# Patient Record
Sex: Female | Born: 1935 | Race: White | Hispanic: No | Marital: Married | Smoking: Former smoker
Health system: Southern US, Community
[De-identification: ages and names within clinical notes are randomized; demographics above are authoritative.]

## PROBLEM LIST (undated history)

## (undated) DIAGNOSIS — I2510 Atherosclerotic heart disease of native coronary artery without angina pectoris: Secondary | ICD-10-CM

## (undated) DIAGNOSIS — I10 Essential (primary) hypertension: Secondary | ICD-10-CM

## (undated) DIAGNOSIS — R918 Other nonspecific abnormal finding of lung field: Secondary | ICD-10-CM

## (undated) DIAGNOSIS — E7800 Pure hypercholesterolemia, unspecified: Secondary | ICD-10-CM

## (undated) DIAGNOSIS — F419 Anxiety disorder, unspecified: Secondary | ICD-10-CM

## (undated) DIAGNOSIS — I639 Cerebral infarction, unspecified: Secondary | ICD-10-CM

## (undated) DIAGNOSIS — M1990 Unspecified osteoarthritis, unspecified site: Secondary | ICD-10-CM

## (undated) DIAGNOSIS — J189 Pneumonia, unspecified organism: Secondary | ICD-10-CM

## (undated) HISTORY — DX: Anxiety disorder, unspecified: F41.9

## (undated) HISTORY — DX: Pure hypercholesterolemia, unspecified: E78.00

## (undated) HISTORY — DX: Essential (primary) hypertension: I10

## (undated) HISTORY — PX: CORONARY ANGIOPLASTY WITH STENT PLACEMENT: SHX49

## (undated) HISTORY — PX: OTHER SURGICAL HISTORY: SHX169

## (undated) HISTORY — DX: Atherosclerotic heart disease of native coronary artery without angina pectoris: I25.10

---
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
## 1974-04-05 HISTORY — PX: OTHER SURGICAL HISTORY: SHX169

## 1975-04-06 HISTORY — PX: PARTIAL HYSTERECTOMY: SHX80

## 1988-04-05 HISTORY — PX: NECK SURGERY: SHX720

## 2005-06-02 ENCOUNTER — Ambulatory Visit (HOSPITAL_COMMUNITY): Admission: RE | Admit: 2005-06-02 | Discharge: 2005-06-02 | Payer: Self-pay | Admitting: Preventative Medicine

## 2006-04-05 DIAGNOSIS — I2510 Atherosclerotic heart disease of native coronary artery without angina pectoris: Secondary | ICD-10-CM

## 2006-04-05 HISTORY — DX: Atherosclerotic heart disease of native coronary artery without angina pectoris: I25.10

## 2006-08-01 ENCOUNTER — Encounter: Admission: RE | Admit: 2006-08-01 | Discharge: 2006-08-01 | Payer: Self-pay | Admitting: Family Medicine

## 2006-08-03 ENCOUNTER — Ambulatory Visit: Payer: Self-pay | Admitting: Cardiovascular Disease

## 2006-08-03 LAB — CONVERTED CEMR LAB
Basophils Relative: 0.3 % (ref 0.0–1.0)
Chloride: 106 meq/L (ref 96–112)
Creatinine, Ser: 0.9 mg/dL (ref 0.4–1.2)
GFR calc Af Amer: 80 mL/min
HCT: 40.2 % (ref 36.0–46.0)
Hemoglobin: 13.7 g/dL (ref 12.0–15.0)
INR: 0.9 (ref 0.9–2.0)
Lymphocytes Relative: 34.1 % (ref 12.0–46.0)
Monocytes Relative: 9.6 % (ref 3.0–11.0)
Neutrophils Relative %: 52.7 % (ref 43.0–77.0)
Platelets: 221 10*3/uL (ref 150–400)
Potassium: 3.9 meq/L (ref 3.5–5.1)

## 2006-08-05 ENCOUNTER — Ambulatory Visit: Payer: Self-pay

## 2006-08-09 ENCOUNTER — Ambulatory Visit: Payer: Self-pay | Admitting: Cardiovascular Disease

## 2006-08-09 ENCOUNTER — Inpatient Hospital Stay (HOSPITAL_COMMUNITY): Admission: AD | Admit: 2006-08-09 | Discharge: 2006-08-11 | Payer: Self-pay | Admitting: Cardiovascular Disease

## 2006-08-09 ENCOUNTER — Inpatient Hospital Stay (HOSPITAL_BASED_OUTPATIENT_CLINIC_OR_DEPARTMENT_OTHER): Admission: RE | Admit: 2006-08-09 | Discharge: 2006-08-09 | Payer: Self-pay | Admitting: Cardiovascular Disease

## 2006-09-05 ENCOUNTER — Ambulatory Visit: Payer: Self-pay | Admitting: Cardiology

## 2006-09-05 ENCOUNTER — Ambulatory Visit: Payer: Self-pay | Admitting: Cardiovascular Disease

## 2006-09-06 ENCOUNTER — Ambulatory Visit: Payer: Self-pay | Admitting: Cardiovascular Disease

## 2006-09-06 LAB — CONVERTED CEMR LAB
Alkaline Phosphatase: 98 units/L (ref 39–117)
Bilirubin, Direct: 0.1 mg/dL (ref 0.0–0.3)
Cholesterol: 184 mg/dL (ref 0–200)
HDL: 48.9 mg/dL (ref >39.0)
Total Bilirubin: 0.4 mg/dL (ref 0.3–1.2)
Total Protein: 6.9 g/dL (ref 6.0–8.3)

## 2007-08-16 ENCOUNTER — Ambulatory Visit: Payer: Self-pay

## 2007-08-16 ENCOUNTER — Ambulatory Visit: Payer: Self-pay | Admitting: Cardiovascular Disease

## 2009-03-24 ENCOUNTER — Emergency Department (HOSPITAL_COMMUNITY): Admission: EM | Admit: 2009-03-24 | Discharge: 2009-03-24 | Payer: Self-pay | Admitting: Emergency Medicine

## 2009-08-29 ENCOUNTER — Emergency Department (HOSPITAL_COMMUNITY): Admission: EM | Admit: 2009-08-29 | Discharge: 2009-08-29 | Payer: Self-pay | Admitting: Emergency Medicine

## 2009-08-29 ENCOUNTER — Encounter: Payer: Self-pay | Admitting: Cardiovascular Disease

## 2010-01-07 DIAGNOSIS — I10 Essential (primary) hypertension: Secondary | ICD-10-CM

## 2010-01-07 DIAGNOSIS — I2510 Atherosclerotic heart disease of native coronary artery without angina pectoris: Secondary | ICD-10-CM | POA: Insufficient documentation

## 2010-01-07 DIAGNOSIS — E7800 Pure hypercholesterolemia, unspecified: Secondary | ICD-10-CM

## 2010-01-07 DIAGNOSIS — F411 Generalized anxiety disorder: Secondary | ICD-10-CM | POA: Insufficient documentation

## 2010-01-08 ENCOUNTER — Ambulatory Visit: Payer: Self-pay | Admitting: Cardiovascular Disease

## 2010-01-26 ENCOUNTER — Encounter: Payer: Self-pay | Admitting: Cardiovascular Disease

## 2010-04-21 ENCOUNTER — Emergency Department (HOSPITAL_COMMUNITY)
Admission: EM | Admit: 2010-04-21 | Discharge: 2010-04-21 | Payer: Self-pay | Source: Home / Self Care | Admitting: Emergency Medicine

## 2010-05-05 NOTE — Progress Notes (Signed)
Summary: Physician Documentation Sheet  Physician Documentation Sheet   Imported By: Debby Freiberg 01/07/2010 15:54:59  _____________________________________________________________________  External Attachment:    Type:   Image     Comment:   External Document

## 2010-05-05 NOTE — Assessment & Plan Note (Signed)
Summary: check up c/o stent placment '08/mt   CC:  check up.  History of Present Illness: [name] returns today for follow up.  She has had a previous stent to an OM branch back in 2008.  She is doing well.  She is not having angina.  She is hesitant to take medications. She gets bruising with Plavix and since her stent is 74 years old I told her she could stop this.  She has not had her cholesterol checked and I urged her to have Dr Jeannetta Nap check this as it has been high and she has known CAD.  She did mangage to stop smoking last June and I congratulated her on this. BP has been under good control with Felodipine.  Current Problems (verified): 1)  Hypertension  (ICD-401.9) 2)  Cad  (ICD-414.00) 3)  Anxiety  (ICD-300.00) 4)  Hypercholesterolemia  (ICD-272.0)  Current Medications (verified): 1)  Omega-3 Krill Oil 300 Mg Caps (Krill Oil) .Marland Kitchen.. 1 Tab By Mouth Once Daily 2)  Vitamin B-12 1000 Mcg Tabs (Cyanocobalamin) .Marland Kitchen.. 1  Tab By Mouth Once Daily 3)  Vitamin C 1000 Mg Tabs (Ascorbic Acid) .Marland Kitchen.. 1 Tab By Mouth Once Daily 4)  Vitamin D3 2000 Unit Caps (Cholecalciferol) .Marland Kitchen.. 1 Tab By Mouth Once Daily 5)  Aspirin 81 Mg  Tabs (Aspirin) .Marland Kitchen.. 1 Tab By Mouth Once Daily 6)  Plavix 75 Mg Tabs (Clopidogrel Bisulfate) .... Take One Tablet By Mouth Daily 7)  Felodipine 10 Mg Xr24h-Tab (Felodipine) .Marland Kitchen.. 1 Tab By Mouth Once Daily  Allergies (verified): No Known Drug Allergies  Past History:  Family History: Last updated: 01/08/2010 non-contributorhy  Social History: Last updated: 01/08/2010 Married Nonsmoker Nondrinker Walks regularly  Family History: non-contributorhy  Social History: Married Nonsmoker Nondrinker Walks regularly  Review of Systems       Denies fever, malais, weight loss, blurry vision, decreased visual acuity, cough, sputum, SOB, hemoptysis, pleuritic pain, palpitaitons, heartburn, abdominal pain, melena, lower extremity edema, claudication, or rash.   Vital  Signs:  Patient profile:   76 year old female Height:      62 inches Weight:      131 pounds BMI:     24.05 Pulse rate:   70 / minute Resp:     14 per minute BP sitting:   129 / 58  (left arm)  Vitals Entered By: Kem Parkinson (January 08, 2010 10:20 AM)  Physical Exam  General:  Affect appropriate Healthy:  appears stated age HEENT: normal Neck supple with no adenopathy JVP normal no bruits no thyromegaly Lungs clear with no wheezing and good diaphragmatic motion Heart:  S1/S2 no murmur,rub, gallop or click PMI normal Abdomen: benighn, BS positve, no tenderness, no AAA no bruit.  No HSM or HJR Distal pulses intact with no bruits No edema Neuro non-focal Skin warm and dry    Impression & Recommendations:  Problem # 1:  HYPERTENSION (ICD-401.9) Well controlled Her updated medication list for this problem includes:    Aspirin 81 Mg Tabs (Aspirin) .Marland Kitchen... 1 tab by mouth once daily    Felodipine 10 Mg Xr24h-tab (Felodipine) .Marland Kitchen... 1 tab by mouth once daily  Orders: EKG w/ Interpretation (93000)  Problem # 2:  CAD (ICD-414.00) Stable with no angina Ok to stop Plavix Her updated medication list for this problem includes:    Aspirin 81 Mg Tabs (Aspirin) .Marland Kitchen... 1 tab by mouth once daily    Plavix 75 Mg Tabs (Clopidogrel bisulfate) .Marland Kitchen... Take one tablet by mouth  daily    Felodipine 10 Mg Xr24h-tab (Felodipine) .Marland Kitchen... 1 tab by mouth once daily  Orders: EKG w/ Interpretation (93000)  Problem # 3:  HYPERCHOLESTEROLEMIA (ICD-272.0) Recheck labs and likely encourage statin use for target LDL less than 70  Patient Instructions: 1)  Your physician recommends that you schedule a follow-up appointment in: 1 YEAR WITH DR Eden Emms 2)  Your physician recommends that you return for lab work in: DUE TO HAVE CHOLESTEROL BLOOD TEST HERE OR AT PMD 3)  Your physician has recommended you make the following change in your medication: STOP PLAVIX   EKG Report  Procedure date:   01/08/2010  Findings:      NSR 66 Normal ECG

## 2010-08-18 NOTE — Assessment & Plan Note (Signed)
[facility]                            CARDIOLOGY OFFICE NOTE   NAME:[name], [name] CLASBY                      MRN:          [id number]  DATE:08/16/2007                            DOB:          January 18, 1935    [name] [name] returns today for follow up.  She has had a previous stent  to an OM branch back in 2008.  She is doing well.  She is not having  angina.  She is hesitant to take medications.  She wanted to come off  her Plavix, and I told her it was not advisable.  She does not want to  take cholesterol medicine.  We had a long discussion about this.  Her  LDL is 115.  She insists on trying to treat it with diet.  She has been  compliant with her aspirin, Plavix and blood pressure pills.  She was  asking for a handwritten prescription for her Plavix to send to a  [place] drug company.  I told her I would be happy to do this to help  defer the cost.   REVIEW OF SYSTEMS:  Otherwise negative.  In particular, she has not had  any side effects from her medication and is not having angina.   CURRENT MEDICATIONS:  1. Felodipine 10 mg a day.  2. Plavix 75 a day.  3. An aspirin a day.  4. Ativan p.r.n.  5. Vitamin D.   PHYSICAL EXAMINATION:  GENERAL:  Her exam is remarkable for a talkative  white female in no distress.  VITAL SIGNS:  Weight is 121, blood pressure is 150/80, pulse 69 and  regular, afebrile.  NECK:  Carotids normal without bruit.  No lymphadenopathy, no  thyromegaly, no JVP elevation.  LUNGS:  Clear with good diaphragmatic motion.  No wheezing.  HEART:  S1-S2, normal heart sounds.  PMI normal.  ABDOMEN:  Benign.  Bowel sounds.  No AAA, no tenderness, no  hepatosplenomegaly, no hepatojugular reflux.  EXTREMITIES:  Distal pulses are intact, no edema.  NEUROLOGIC:  Nonfocal.  SKIN:  Warm and dry.  No muscle weakness.   I reviewed her carotid artery duplex.  It was normal with 0 to 39%  bilateral stenoses.   IMPRESSION:  1. Coronary  disease, previous stent to the OM.  Continue aspirin and      Plavix.  Follow-up Myoview in a year.  2. Hypertension, currently better controlled on Plendil.  Continue a      low-salt diet.  3. Hypercholesterolemia.  The patient insists on diet control.  Should      be on a statin.  She refuses.  4. History of anxiety.  Ativan p.r.n. per primary care doctor.   I congratulated the patient on her recent marriage 6 months ago.  Her  new husband was with her today.  He seems very nice.  Her EKG was  entirely normal today.     [name]. Eden Emms, MD, [facility]  Electronically Signed    PCN/MedQ  DD: 08/16/2007  DT: 08/16/2007  Job #: [id number]

## 2010-08-18 NOTE — Assessment & Plan Note (Signed)
[name] [name]                            CARDIOLOGY OFFICE NOTE   NAME:[name] [name], [name] [name]                      MRN:          [id number]  DATE:09/05/2006                            DOB:          10/01/1934    [name] [name] returns today for followup. She initially presented to me with  somewhat atypical jaw pain with GI overtones; however, this turned out  to be an anginal equivalent. She had a tight OM lesion that was stented  by Dr. Juanda Chance.   Since discharge, she has had some bruising around the cath site in her  right arm. She has not had any recurrent chest pain. Her symptoms have  resolved indicating that these jaw and GI type symptoms were her anginal  equivalent. Her coronary risk factors include family history and  hypertension. Her cholesterol status is unknown but I suspect she will  need to be no a statin drug.   When she left the hospital, she was only taking Plavix, she was scared  to take both baby aspirin and Plavix. I told her she needed to do this.   REVIEW OF SYSTEMS:  Otherwise benign. She was asking for Ativan which  she normally takes for anxiety at night.   CURRENT MEDICATIONS:  1. Zyrtec 10 a day.  2. Felodipine 10 a day.  3. Piroxicam 20 a day.  4. Plavix 75 a day.  5. To start taking the baby aspirin a day.  6. Ativan 1 mg a day.   PHYSICAL EXAMINATION:  GENERAL:  She is a healthy-appearing, elderly,  white female who appears younger than her stated age. Affect is  elevated.  VITAL SIGNS:  Her weight is stable at 180, blood pressure is 140/80,  pulse is 70 and regular. Respiratory rate is 14, she is afebrile.  HEENT:  Normal.  NECK:  Supple. There is no thyromegaly, no lymphadenopathy, no JVP  elevation. She has a faint left carotid bruit.  LUNGS:  Clear with normal diaphragmatic motion, no wheezing. There is an  S1, S2 with normal heart sounds.  CARDIAC:  PMI is normal.  ABDOMEN:  Benign. Bowel sounds positive. There is no  tenderness noted,  no hepatosplenomegaly, no hepatojugular reflux. Femoral site is well  healed with no residual bruit. There is mild ecchymosis.  EXTREMITIES:  Distal pulses are intact. No edema.  NEUROLOGIC:  Nonfocal.  MUSCULOSKELETAL:  Muscular exam is normal with no weakness.   IMPRESSION:  Stable coronary artery disease, recent stent to the OM  branch. Continue aspirin and Plavix.   The patient is not on a beta blocker. She prefers not to start this at  this time.   I suspect she will need to be on a statin drug. She will have a fasting  lipid and liver profile. I took the liberty of giving her a prescription  for Zocor. Apparently Dr. Juanda Chance gave her a prescription for Lipitor  when she left the hospital but she did not want to take this for some  reason. Her husband takes Zocor and she prefers to be on this.   Her blood  pressure is well controlled on felodipine. We will continue  this.   We will monitor her blood pressure at home.   She will have a CBC and platelet count as she appears to have some easy  bruising and I want to make sure that the Plavix does not cause  thrombocytopenia.   In regards her anxiety, I did refill her prescriptions for Ativan to  take q.h.s. She will followup with Dr. Windle Guard for any further  refills.   I will see her back in about 3 months.     [name]. Eden Emms, MD, [facility]  Electronically Signed    PCN/MedQ  DD: 09/05/2006  DT: 09/05/2006  Job #: [id number]

## 2010-08-21 NOTE — Cardiovascular Report (Signed)
[name] [name], [name] [name]               ACCOUNT NO.:  [id number]   MEDICAL RECORD NO.:  [id number]          PATIENT TYPE:  INP   LOCATION:  2807                         FACILITY:  [facility]   PHYSICIAN:  Everardo Beals. Juanda Chance, MD, FACCDATE OF BIRTH:  04-22-1934   DATE OF PROCEDURE:  08/10/2006  DATE OF DISCHARGE:                            CARDIAC CATHETERIZATION   PROCEDURE:  Percutaneous coronary intervention procedure.   HISTORY OF PRESENT ILLNESS:  [name] is a 76 year old and has  developed exertional chest and jaw pain over the last 1-2 weeks.  She  was studied yesterday in the outpatient laboratory by Dr. Eden Emms and  found to have a tight lesion in the circumflex marginal vessel.  She is  brought in the hospital for intervention today.  She was consented to  the Perseus Trial with the new Taxus element stent.   PROCEDURE:  The procedure initially was attempted via the left femoral  artery.  We initially had some trouble passing the wire up all the way  to the aorta, and after placing the sheath, we were unable to navigate  further up the aorta.  Injection showed staining, and we felt there was  a wire dissection.  For this reason, we accessed the right femoral  artery.  This was accomplished without difficulty.  We used a CLS 3.5 6-  [place] guiding catheter with side holes and a Research scientist (physical sciences).  The  patient given Angiomax bolus and infusion that had been previously  loaded with Plavix.  We initially predilated the lesion in the  circumflex marginal vessel with a 2.25 x 50-mm Maverick performing one  inflation of 8 atmospheres for 30 seconds.  We then deployed a 2.25 x 20-  mm TAXUS - Element Perseus study stent deploying this with two  inflations at 10 atmospheres for 30 seconds.  We then post dilated with  a 2.5 x 50-mm Quantum Maverick performing two inflations at 15  atmospheres for 30 seconds being careful to avoid the distal edge. Final  diagnostic studies performed through the  guiding catheter.   Following the PCI procedure, we did distal aortogram to assess the left  iliac vessel which now was patent.  There was no evidence of the wire  tear and no staining or evident dissection, but there was narrowing of  the lumen just distal to the sheath.  We felt this could be left alone.   RESULTS:  Initially the stenosis in the circumflex marginal vessels  estimated 90%.  Following stenting this improved to 0%.   CONCLUSION:  Successful PCI of the lesion in the marginal branch of  circumflex artery using a Taxus Element PERSEUS study stent with  improvement of circumflex narrowing from 90% to 0%.   DISPOSITION:  The patient was returned to the __________ for further  observation.  Will get both sheaths out in 2 hours.      Bruce Elvera Lennox Juanda Chance, MD, [facility]  Electronically Signed     BRB/MEDQ  D:  08/10/2006  T:  08/10/2006  Job:  [id number]   cc:   Windle Guard, M.D.  [name].  Eden Emms, MD, [facility]  Cardiopulmonary Laboratory

## 2010-08-21 NOTE — Consult Note (Signed)
NAME:  [name] [name], [name] [name]               ACCOUNT NO.:  [id number]   MEDICAL RECORD NO.:  [id number]          PATIENT TYPE:  OIB   LOCATION:  NA                           FACILITY:  [facility]   PHYSICIAN:  Peter C. Eden Emms, MD, FACCDATE OF BIRTH:  [birth year]   DATE OF CONSULTATION:  DATE OF DISCHARGE:                                 CONSULTATION   HISTORY OF PRESENT ILLNESS:  [name] [name] is a 76 year old patient of Dr.  Jeannetta Nap.  She has been having new onset jaw pain.  She is a long-time  smoker with a left carotid bruit.  Study was done to rule out coronary  disease.   Stent catheterizations, left femoral artery.  We had some difficulty  passing our wire during sheath placement; therefore, at the end of the  case a distal aortogram with bilateral iliac runoff was performed.   Left main coronary artery was normal.   Left anterior descending artery had 20% multiple lesions at the proximal  and mid portions.  Distal vessel was normal.  The ostium of the first  diagonal branch had 30-40% tubular disease.   Circumflex coronary artery was large and left dominant.  There was a  large first obtuse marginal branch.  It was a branching vessel.  The  inferior branch which was the larger branch had 90% napkin-like  circumferential stenosis.  The distal circ and PDA were normal.   Right carotid artery was nondominant and small.  There was 30-40%  tubular disease in the mid vessel.   RAO ventriculography:  RAO ventriculography showed hyperdynamic LV  function.  The ejection fraction was 65-70%.  There were no wall motion  abnormalities.  Aortic pressure was 140/90, LV pressure was 143/17.   Distal aortogram showed small left renal artery, and there was mild  iliac disease bilaterally.  There was no critical stenoses.   IMPRESSION:  The patient's sharp pain certainly could be an anginal  equivalent.  The obtuse marginal branch lesion is tight.  She was having  7/10 substernal chest pain this morning  prior to the cath.  I think the  best plan of action would be to admit the patient, load her with Plavix  and heparin, and proceed with percutaneous coronary intervention with  Dr. Juanda Chance in the morning.   The patient tolerated the procedure well.      [name]. Eden Emms, MD, [facility]  Electronically Signed     PCN/MEDQ  D:  08/09/2006  T:  08/09/2006  Job:  [id number]

## 2010-08-21 NOTE — Assessment & Plan Note (Signed)
[facility]                            CARDIOLOGY OFFICE NOTE   NAME:[name], [name] SCAGLIONE                      MRN:          [id number]  DATE:08/03/2006                            DOB:          08/04/1934    [name] is a extremely pleasant 76 year old patient referred by Dr.  Jeannetta Nap.  She has been having chest pain for 7-10 days.  The pain is  actually more localized in her jaw.  She has been having jaw pain that  radiates to her neck and right side.  There has been a lot of GI  associated symptoms, including burping and belching.  When she gets this  jaw and right neck pain, she can actually take antacids and feel better.  She had a right upper quadrant ultrasound ordered by Dr. Jeannetta Nap which  was apparently negative.  She has not had any symptoms in the last 48  hours.   The pain is not necessarily exertional.   It has been going on for a week to 10 days and she had previously not  had any problems with jaw pain.  She does have dentures and has not seen  a dentist in about 3 years.  This needs to be followed up for TMJ or  other possibilities.   From a cardiac standpoint, she has otherwise been healthy.  Coronary  risk factors include hypertension and smoking.   She has smoked 2 packs a day for quite some time and is on felodipine  for high blood pressure.   She is a non diabetic.   Her activity is somewhat limited.  She has been a little bit depressed.  She lost a daughter to lymphoma last year and her husband.  Apparently  her husband  had seen Dr. Myrtis Ser and Dr. Samule Ohm in the past.   REVIEW OF SYSTEMS:  Her review of systems otherwise remarkable for some  diarrhea.  She had some stomach upset with her jaw pain.  There was also  associated nausea but no frank vomiting.  Her diarrhea and nausea have  cleared up.  I suspect this is what necessitated the right upper  quadrant ultrasound.  I do not know if any lab work was done.  Her  review of  systems otherwise negative.   FAMILY HISTORY:  The patient's mother died at the age of 59 of  congestive heart failure, father died of a cerebral hemorrhage at age  58.   MEDICATIONS:  1. Zyrtec 10 a day.  2. Felodipine 10 a day.  3. Piroxicam 20 a day.  4. Vitamins.   SOCIAL HISTORY:  She lives by herself.  She has 2 dogs to keep her  company.  She has 1 daughter that lives less than a mile away.  She is  otherwise fairly inactive.   SURGICAL HISTORY:  Lower back surgery in 1976, partial hysterectomy in  1977, neck surgery in 1990, and appendectomy in 1950.   PHYSICAL EXAMINATION:  Healthy appearing female in no distress  Blood pressure 128/80, pulse 72 and regular. Afebrile RR 14  HEENT:  Normal.  NECK:  She has a fairly loud left carotid bruit.  LUNGS:  Clear.  No accesory muscle use  CARDIAC:  There is an S1, S2, with normal heart sounds. PMI normal  ABDOMEN:  Benign. No bruits masses or AAA  LOWER EXTREMITIES:  Intact pulses, no edema.  NEURO:  Nonfocal.  SKIN:  Warm and dry.  No muscular weakness  No lympadenopathy   Her EKG is totally normal.   IMPRESSION:  Her clinical symptoms sound more like possible intermittent  gallbladder spasms.  Apparently her right upper quadrant ultrasound was  normal.  There are a lot of GI overtones to her symptoms and they seem  to be improving.  In fact, she did have improvement with H2 blockers and  antacids.  However, I am somewhat concerned.  Jaw pain can frequently be  an anginal equivalent in females.  She is a long-time smoker and she  clearly has a carotid bruit.   Given this fact and the new onset of the symptoms, I recommended heart  catheterization to the patient.  She understands what this involves  since her husband had had them.   She is willing to proceed.  The risks, including stroke, need for  emergency surgery, dye reaction, and bleeding were discussed.  The  patient will have a carotid Duplex prior to this and we  will start her  on Plavix and aspirin.   She will be given a prescription for nitroglycerine to take in case she  were to have any recurrent episodes.  Further recommendations will be  based on the results of her heart catheterization and we will follow up  her carotid Duplex to make sure she does not have greater than 70%  lesion there in regards to her risk for stroke.   I suspect that she will also need a fasting lipid and liver profile  checked.  She will follow up with Dr. Jeannetta Nap regarding possibility of  Wellbutrin or Chantix to help her cut down on her smoking.   Further recommendations will be based on the results of her heart  catheterization.     [name]. Eden Emms, MD, [facility]  Electronically Signed    PCN/MedQ  DD: 08/03/2006  DT: 08/03/2006  Job #: [id number]   cc:   Windle Guard, M.D.

## 2010-08-21 NOTE — Discharge Summary (Signed)
NAMEJOPLIN, CANTY               ACCOUNT NO.:  [id number]   MEDICAL RECORD NO.:  [id number]          PATIENT TYPE:  INP   LOCATION:  6532                         FACILITY:  [facility]   PHYSICIAN:  Peter C. Eden Emms, MD, FACCDATE OF BIRTH:  February 05, 1935   DATE OF ADMISSION:  08/09/2006  DATE OF DISCHARGE:  08/11/2006                         DISCHARGE SUMMARY - REFERRING   DISCHARGE DIAGNOSES:  1. Unstable angina.  2. Status post stenting to the first obtuse marginal with a Taxus      stent which is drug-eluting.  3. [employer] study.  4. Tobacco use.  5. History of hypertension.  6. History as noted below.   PROCEDURES PERFORMED:  Cardiac catheterization on 08/09/2006 by Dr.  Eden Emms and drug-eluting stent to the OM1 on 08/10/2006 by Dr. Juanda Chance.   SUMMARY OF HISTORY:  [name] is a 76 year old white female who is  referred by Dr. Jeannetta Nap to Dr. Eden Emms on 08/03/2006.  She described a 7-  to 10-day history of exertional jaw discomfort radiating into her neck  and right side associated with burping and belching.  A right upper  quadrant ultrasound was unremarkable.  It was felt that this could be an  anginal equivalent, thus, Dr. Eden Emms recommended cardiac  catheterization.   PAST MEDICAL HISTORY:  Notable for tobacco use and hypertension.   LABORATORY:  Admission weight was 120 pounds.  Preadmission H&H was 13.7  and 40.2, normal indices, platelets 221, WBCs 9.6.  Prior to discharge  on the 8th, H&H was 11.0 and 32.7, normal indices, platelets 165, WBCs  9.9.  PTT was 26.5, PT 11.5.  On admission, sodium was 149, potassium  3.9, BUN 13, creatinine 0.9, glucose 97.  Prior to discharge sodium was  139, potassium 4.5, BUN 15, creatinine 1.05, glucose 98.  CK-MBs and  relative indexes x4 were unremarkable.  Troponins were 0.03, 0.03,  0.8  and 0.49.  EKG showed sinus bradycardia, normal axis, nonspecific ST-T  wave changes.   HOSPITAL COURSE:  [name] was brought in for  cardiac catheterization  on the 6th.  This was performed by Dr. Eden Emms.  Showed an ejection  fraction of 65% and a 90% OM1.  She was continued on heparin.  Findings  were reviewed with Dr. Juanda Chance.  It was felt that intervention should be  performed.  She was entered into the [employer] study.  Dr.  Juanda Chance, on 08/10/2006, performed stenting to the OM reducing this from  90% to 0%.  He was unable to axis via the left femoral artery due to  left __________.  Post procedure, the patient had bilateral blunt  ecchymosis with soft hematoma on the right.  Dr. Juanda Chance recommended  Plavix at least for 1 year.  On the 8th she was ambulating without  difficulty.  Dr. Eden Emms felt that she could be discharged home.   DISPOSITION:  Patient is discharged home.  Her wound care and activities  are dictated per her discharge instruction sheet.  She will follow up  with Dr. Dr. Eden Emms on 09/05/2006 at 12:15 p.m.  She was also asked to  follow  up with Dr. Jeannetta Nap as needed.   NEW PRESCRIPTIONS:  Include Plavix 75 mg daily, aspirin 325 daily,  Lipitor 40 mg nightly, Protonix 40 mg daily, Lopressor 25 b.i.d.,  nitroglycerin 0.4 as needed, Wellbutrin XL 150 mg daily for 2 months.  She was asked to continue her Zyrtec 10 mg daily and piroxicam 20 mg  daily.  She was asked not to take her felodipine.  She was asked to  avoid smoking or tobacco products.  She will need blood work in 6-8  weeks in regards to her FLP and LFTs.  She was asked to bring all  medications to all follow-up appointments and to maintain a low-sodium  heart-healthy diet.   Discharge time 25 minutes.      Joellyn Rued, PA-C      [name]. Eden Emms, MD, [facility]  Electronically Signed    EW/MEDQ  D:  08/11/2006  T:  08/11/2006  Job:  [id number]   cc:   Windle Guard, M.D.  [name]. Eden Emms, MD, [facility]

## 2010-08-21 NOTE — Cardiovascular Report (Signed)
[name], [name]               ACCOUNT NO.:  [id number]   MEDICAL RECORD NO.:  [id number]          PATIENT TYPE:  INP   LOCATION:  6532                         FACILITY:  [facility]   PHYSICIAN:  Peter C. Eden Emms, MD, FACCDATE OF BIRTH:  1934/12/10   DATE OF PROCEDURE:  09/09/2006  DATE OF DISCHARGE:  08/11/2006                            CARDIAC CATHETERIZATION   PROCEDURE:  Left coronaries and LV, aorta with bilateral runoff.   The patient had a left-dominant coronary circulation.  Left main  coronary artery was normal.   Left anterior descending artery was normal.   First and second diagonal branches were normal.   Circumflex coronary artery was left-dominant.  The proximal, mid and  distal circumflex were normal.  There was a 90% stenosis in the first  obtuse marginal branch.   The right coronary artery was small and nondominant and normal.   RAO ventriculography showed normal LV function with an EF of 65%.  There  was no gradient across the aortic valve and no MR.  LV pressure is  142/17, aortic pressure is 140/90.   IMPRESSION:  The patient has single-vessel coronary artery disease.  She  will be admitted for heparin therapy to have percutaneous coronary  intervention of the obtuse marginal branch in the morning.      [name] [name]. Eden Emms, MD, [facility]  Electronically Signed     PCN/MEDQ  D:  09/09/2006  T:  09/10/2006  Job:  [id number]

## 2011-01-05 ENCOUNTER — Encounter: Payer: Self-pay | Admitting: Cardiovascular Disease

## 2011-01-06 ENCOUNTER — Ambulatory Visit (INDEPENDENT_AMBULATORY_CARE_PROVIDER_SITE_OTHER): Payer: 59 | Admitting: Cardiovascular Disease

## 2011-01-06 ENCOUNTER — Encounter: Payer: Self-pay | Admitting: Cardiovascular Disease

## 2011-01-06 DIAGNOSIS — I2510 Atherosclerotic heart disease of native coronary artery without angina pectoris: Secondary | ICD-10-CM

## 2011-01-06 DIAGNOSIS — I10 Essential (primary) hypertension: Secondary | ICD-10-CM

## 2011-01-06 DIAGNOSIS — E7800 Pure hypercholesterolemia, unspecified: Secondary | ICD-10-CM

## 2011-01-06 DIAGNOSIS — E785 Hyperlipidemia, unspecified: Secondary | ICD-10-CM

## 2011-01-06 DIAGNOSIS — Z136 Encounter for screening for cardiovascular disorders: Secondary | ICD-10-CM

## 2011-01-06 LAB — HEPATIC FUNCTION PANEL
ALT: 15 U/L (ref 0–35)
Alkaline Phosphatase: 74 U/L (ref 39–117)
Bilirubin, Direct: 0 mg/dL (ref 0.0–0.3)

## 2011-01-06 NOTE — Assessment & Plan Note (Signed)
Stable with no angina and good activity level.  Continue medical Rx  

## 2011-01-06 NOTE — Patient Instructions (Signed)
Your physician wants you to follow-up in: YEAR WITH DR Haywood Filler will receive a reminder letter in the mail two months in advance. If you don't receive a letter, please call our office to schedule the follow-up appointment.  Your physician recommends that you continue on your current medications as directed. Please refer to the Current Medication list given to you today. Your physician recommends that you return for lab work in: LIPID AND LIVER DX 272.4 V58.69

## 2011-01-06 NOTE — Assessment & Plan Note (Signed)
Check labs today wiling to start statin.  Try zocor 20 mg if LDL over 130

## 2011-01-06 NOTE — Assessment & Plan Note (Signed)
Well controlled.  Continue current medications and low sodium Dash type diet.    

## 2011-01-06 NOTE — Progress Notes (Signed)
[name] returns today for follow up. She has had a previous stent to an OM branch back in 2008. She is doing well. She is not having angina. She is hesitant to take medications. She gets bruising with Plavix and since her stent is 74 years old I told her she could stop this on our last visit in 2011. Last cholesterol checked by Dr Jeannetta Nap 10/11 TC was 258 and LDL was 169.  She did mangage to stop smoking last June and I congratulated her on this. BP has been under good control with Felodipine.  ROS: Denies fever, malais, weight loss, blurry vision, decreased visual acuity, cough, sputum, SOB, hemoptysis, pleuritic pain, palpitaitons, heartburn, abdominal pain, melena, lower extremity edema, claudication, or rash.  All other systems reviewed and negative  General: Affect appropriate Healthy:  appears stated age HEENT: normal Neck supple with no adenopathy JVP normal no bruits no thyromegaly Lungs clear with no wheezing and good diaphragmatic motion Heart:  S1/S2 no murmur,rub, gallop or click PMI normal Abdomen: benighn, BS positve, no tenderness, no AAA no bruit.  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 Ascorbic Acid (VITAMIN C) 1000 MG tablet Take 1,000 mg by mouth daily.        Marland Kitchen aspirin 81 MG tablet Take 81 mg by mouth daily.        . Cholecalciferol (VITAMIN D3) 2000 UNITS TABS Take by mouth.        . felodipine (PLENDIL) 10 MG 24 hr tablet Take 10 mg by mouth daily.        . OMEGA-3 KRILL OIL 300 MG CAPS Take by mouth.        . vitamin B-12 (CYANOCOBALAMIN) 1000 MCG tablet Take 1,000 mcg by mouth daily.          Allergies  Review of patient's allergies indicates no known allergies.  Electrocardiogram: NSR 65 RAD otherwise normal  Assessment and Plan

## 2011-01-07 LAB — LIPID PANEL
Cholesterol: 233 mg/dL — ABNORMAL HIGH (ref 0–200)
HDL: 60.1 mg/dL (ref >39.00)
Total CHOL/HDL Ratio: 4
Triglycerides: 86 mg/dL (ref 0.0–149.0)
VLDL: 17.2 mg/dL (ref 0.0–40.0)

## 2011-01-07 LAB — LDL CHOLESTEROL, DIRECT: Direct LDL: 157.3 mg/dL

## 2011-01-08 ENCOUNTER — Other Ambulatory Visit: Payer: Self-pay | Admitting: *Deleted

## 2011-01-08 DIAGNOSIS — E785 Hyperlipidemia, unspecified: Secondary | ICD-10-CM

## 2011-01-08 DIAGNOSIS — Z79899 Other long term (current) drug therapy: Secondary | ICD-10-CM

## 2011-01-08 MED ORDER — LOVASTATIN 20 MG PO TABS: 20.0000 mg | ORAL_TABLET | Freq: Every day | ORAL | Status: DC

## 2011-04-09 ENCOUNTER — Other Ambulatory Visit: Payer: 59 | Admitting: *Deleted

## 2011-08-10 ENCOUNTER — Emergency Department (HOSPITAL_COMMUNITY): Payer: 59

## 2011-08-10 ENCOUNTER — Emergency Department (HOSPITAL_COMMUNITY)
Admission: EM | Admit: 2011-08-10 | Discharge: 2011-08-10 | Disposition: A | Discharge status: Home / Self Care | Payer: 59 | Attending: Emergency Medicine | Admitting: Emergency Medicine

## 2011-08-10 ENCOUNTER — Encounter (HOSPITAL_COMMUNITY): Payer: Self-pay | Admitting: *Deleted

## 2011-08-10 DIAGNOSIS — R079 Chest pain, unspecified: Secondary | ICD-10-CM | POA: Insufficient documentation

## 2011-08-10 DIAGNOSIS — R5381 Other malaise: Secondary | ICD-10-CM | POA: Insufficient documentation

## 2011-08-10 DIAGNOSIS — I10 Essential (primary) hypertension: Secondary | ICD-10-CM | POA: Insufficient documentation

## 2011-08-10 DIAGNOSIS — I2510 Atherosclerotic heart disease of native coronary artery without angina pectoris: Secondary | ICD-10-CM | POA: Insufficient documentation

## 2011-08-10 DIAGNOSIS — R110 Nausea: Secondary | ICD-10-CM | POA: Insufficient documentation

## 2011-08-10 DIAGNOSIS — I498 Other specified cardiac arrhythmias: Secondary | ICD-10-CM | POA: Insufficient documentation

## 2011-08-10 DIAGNOSIS — R0602 Shortness of breath: Secondary | ICD-10-CM | POA: Insufficient documentation

## 2011-08-10 LAB — CBC
HCT: 43.6 % (ref 36.0–46.0)
Hemoglobin: 14.6 g/dL (ref 12.0–15.0)
MCH: 29.3 pg (ref 26.0–34.0)
MCHC: 33.5 g/dL (ref 30.0–36.0)
Platelets: 279 10*3/uL (ref 150–400)
RDW: 12.6 % (ref 11.5–15.5)
WBC: 11 10*3/uL — ABNORMAL HIGH (ref 4.0–10.5)

## 2011-08-10 LAB — POCT I-STAT TROPONIN I

## 2011-08-10 LAB — DIFFERENTIAL
Basophils Relative: 1 % (ref 0–1)
Lymphocytes Relative: 22 % (ref 12–46)
Lymphs Abs: 2.4 10*3/uL (ref 0.7–4.0)
Monocytes Absolute: 1.3 10*3/uL — ABNORMAL HIGH (ref 0.1–1.0)
Monocytes Relative: 12 % (ref 3–12)
Neutro Abs: 7 10*3/uL (ref 1.7–7.7)

## 2011-08-10 LAB — COMPREHENSIVE METABOLIC PANEL
Alkaline Phosphatase: 100 U/L (ref 39–117)
Glucose, Bld: 103 mg/dL — ABNORMAL HIGH (ref 70–99)
Sodium: 138 mEq/L (ref 135–145)

## 2011-08-10 LAB — TROPONIN I: Troponin I: <0.3 ng/mL (ref <0.30)

## 2011-08-10 MED ORDER — NITROGLYCERIN 0.4 MG SL SUBL: 0.4000 mg | SUBLINGUAL_TABLET | SUBLINGUAL | Status: DC | PRN

## 2011-08-10 MED ORDER — ASPIRIN 81 MG PO CHEW: 324.0000 mg | CHEWABLE_TABLET | Freq: Once | ORAL | Status: DC

## 2011-08-10 MED ORDER — ASPIRIN 81 MG PO CHEW
162.0000 mg | CHEWABLE_TABLET | Freq: Once | ORAL | Status: AC
  Administered 2011-08-10: 162 mg via ORAL
  Filled 2011-08-10: qty 3

## 2011-08-10 MED ORDER — SODIUM CHLORIDE 0.9 % IV BOLUS (SEPSIS)
500.0000 mL | Freq: Once | INTRAVENOUS | Status: AC
  Administered 2011-08-10: 17:00:00 via INTRAVENOUS

## 2011-08-10 NOTE — Discharge Instructions (Signed)
Chest Pain (Nonspecific) It is often hard to give a specific diagnosis for the cause of chest pain. There is always a chance that your pain could be related to something serious, such as a heart attack or a blood clot in the lungs. You need to follow up with your caregiver for further evaluation. CAUSES   Heartburn.   Pneumonia or bronchitis.   Anxiety or stress.   Inflammation around your heart (pericarditis) or lung (pleuritis or pleurisy).   A blood clot in the lung.   A collapsed lung (pneumothorax). It can develop suddenly on its own (spontaneous pneumothorax) or from injury (trauma) to the chest.   Shingles infection (herpes zoster virus).  The chest wall is composed of bones, muscles, and cartilage. Any of these can be the source of the pain.  The bones can be bruised by injury.   The muscles or cartilage can be strained by coughing or overwork.   The cartilage can be affected by inflammation and become sore (costochondritis).  DIAGNOSIS  Lab tests or other studies, such as X-rays, electrocardiography, stress testing, or cardiac imaging, may be needed to find the cause of your pain.  TREATMENT   Treatment depends on what may be causing your chest pain. Treatment may include:   Acid blockers for heartburn.   Anti-inflammatory medicine.   Pain medicine for inflammatory conditions.   Antibiotics if an infection is present.   You may be advised to change lifestyle habits. This includes stopping smoking and avoiding alcohol, caffeine, and chocolate.   You may be advised to keep your head raised (elevated) when sleeping. This reduces the chance of acid going backward from your stomach into your esophagus.   Most of the time, nonspecific chest pain will improve within 2 to 3 days with rest and mild pain medicine.  HOME CARE INSTRUCTIONS   If antibiotics were prescribed, take your antibiotics as directed. Finish them even if you start to feel better.   For the next few  days, avoid physical activities that bring on chest pain. Continue physical activities as directed.   Do not smoke.   Avoid drinking alcohol.   Only take over-the-counter or prescription medicine for pain, discomfort, or fever as directed by your caregiver.   Follow your caregiver's suggestions for further testing if your chest pain does not go away.   Keep any follow-up appointments you made. If you do not go to an appointment, you could develop lasting (chronic) problems with pain. If there is any problem keeping an appointment, you must call to reschedule.  SEEK MEDICAL CARE IF:   You think you are having problems from the medicine you are taking. Read your medicine instructions carefully.   Your chest pain does not go away, even after treatment.   You develop a rash with blisters on your chest.  SEEK IMMEDIATE MEDICAL CARE IF:   You have increased chest pain or pain that spreads to your arm, neck, jaw, back, or abdomen.   You develop shortness of breath, an increasing cough, or you are coughing up blood.   You have severe back or abdominal pain, feel nauseous, or vomit.   You develop severe weakness, fainting, or chills.   You have a fever.  THIS IS AN EMERGENCY. Do not wait to see if the pain will go away. Get medical help at once. Call your local emergency services (911 in [place] Do not drive yourself to the hospital. MAKE SURE YOU:   Understand these instructions.     Will watch your condition.   Will get help right away if you are not doing well or get worse.  Document Released: 12/30/2004 Document Revised: 03/11/2011 Document Reviewed: 10/26/2007 [employer] Patient Information 2012 [employer]. 

## 2011-08-10 NOTE — ED Notes (Signed)
Pt in c/o intermittent chest pain since yesterday, worse today, also shortness of breath and nausea, weakness and feeling anxious, pt with history of MI with similar symptoms, pain radiates into neck and back

## 2011-08-10 NOTE — ED Notes (Signed)
Bed:WA19<BR> Expected date:<BR> Expected time:<BR> Means of arrival:<BR> Comments:<BR> Hold for triage 1 

## 2011-08-11 NOTE — ED Provider Notes (Signed)
History     CSN: [id number]  Arrival date & time 08/10/11  1530   First MD Initiated Contact with Patient 08/10/11 1550      Chief Complaint  Patient presents with  . Chest Pain    (Consider location/radiation/quality/duration/timing/severity/associated sxs/prior treatment) HPI Patient is a 77 year old female with a history of coronary artery disease and placement of a single coronary artery stent in 2008 by her cardiologist Dr. Eden Emms who presents today after having an episode of 60-90 minutes of left-sided chest pain similar to prior anginal pain. Upon arrival patient was tachycardic to 112. She was otherwise hemodynamically stable. Patient did describe having radiation of her pain into her neck and back. She had associated shortness of breath as well as nausea and weakness with this. Patient said this completely resolved prior to arrival when she took 2 of her nitroglycerin tabs. Patient did not take aspirin prior to arrival. She is currently completely asymptomatic. Patient does have a history of hypertension as well as hypercholesterolemia but is not a smoker. She's not a diabetic. There no other associated or modifying factors. Past Medical History  Diagnosis Date  . CAD (coronary artery disease)   . HTN (hypertension)   . Anxiety   . Hypercholesterolemia     Past Surgical History  Procedure Date  . Pci of hte lesion in th marginal branch circumflex artery   . Lower back surgery 1976  . Partial hysterectomy 1977  . Neck surgery 1990  . Appendectomy 1950  . Coronary angioplasty with stent placement     History reviewed. No pertinent family history.  History  Substance Use Topics  . Smoking status: Never Smoker   . Smokeless tobacco: Never Used  . Alcohol Use: No    OB History    Grav Para Term Preterm Abortions TAB SAB Ect Mult Living                  Review of Systems  HENT: Negative.   Eyes: Negative.   Respiratory: Positive for shortness of breath.     Cardiovascular: Positive for chest pain.  Gastrointestinal: Positive for nausea.  Genitourinary: Negative.   Musculoskeletal: Negative.   Neurological: Positive for weakness.  Psychiatric/Behavioral: Negative.   All other systems reviewed and are negative.    Allergies  Review of patient's allergies indicates no known allergies.  Home Medications   Current Outpatient Rx  Name Route Sig Dispense Refill  . VITAMIN C 1000 MG PO TABS Oral Take 1,000 mg by mouth daily.      . ASPIRIN 81 MG PO TABS Oral Take 81 mg by mouth daily.      Marland Kitchen VITAMIN D3 2000 UNITS PO TABS Oral Take by mouth.      . ESTROGENS CONJUGATED 0.3 MG PO TABS Oral Take 0.3 mg by mouth daily. Take daily for 21 days then do not take for 7 days.    . FELODIPINE ER 10 MG PO TB24 Oral Take 10 mg by mouth daily.      . OMEGA-3 KRILL OIL 300 MG PO CAPS Oral Take by mouth.      Marland Kitchen VITAMIN B-12 1000 MCG PO TABS Oral Take 1,000 mcg by mouth daily.      Marland Kitchen NITROGLYCERIN 0.4 MG SL SUBL Sublingual Place 1 tablet (0.4 mg total) under the tongue every 5 (five) minutes as needed for chest pain. 30 tablet 0    BP 124/103  Pulse 103  Temp(Src) 98.3 F (36.8 C) (Oral)  Resp  16  SpO2 96%  Physical Exam  Nursing note and vitals reviewed. GEN: Well-developed, well-nourished female in no distress HEENT: Atraumatic, normocephalic. Oropharynx clear without erythema EYES: PERRLA BL, no scleral icterus. NECK: Trachea midline, no meningismus CV: Regular rate and rhythm. No murmurs, rubs, or gallops PULM: No respiratory distress.  No crackles, wheezes, or rales. GI: soft, non-tender. No guarding, rebound, or tenderness. + bowel sounds  GU: deferred Neuro: cranial nerves 2-12 intact, no abnormalities of strength or sensation, A and O x 3 MSK: Patient moves all 4 extremities symmetrically, no deformity, edema, or injury noted Skin: No rashes petechiae, purpura, or jaundice Psych: no abnormality of mood   ED Course  Procedures  (including critical care time)   Date: 08/11/2011  Rate: 112  Rhythm: sinus tachycardia  QRS Axis: normal  Intervals: normal  ST/T Wave abnormalities: nonspecific T wave changes  Conduction Disutrbances:none  Narrative Interpretation: Tachycardic compared to previous with no other significant changes noted.  Old EKG Reviewed: changes noted   Labs Reviewed  COMPREHENSIVE METABOLIC PANEL - Abnormal; Notable for the following:    Glucose, Bld 103 (*)    Creatinine, Ser 1.18 (*)    GFR calc non Af Amer 44 (*)    GFR calc Af Amer 51 (*)    All other components within normal limits  CBC - Abnormal; Notable for the following:    WBC 11.0 (*)    All other components within normal limits  DIFFERENTIAL - Abnormal; Notable for the following:    Monocytes Absolute 1.3 (*)    All other components within normal limits  POCT I-STAT TROPONIN I  TROPONIN I   Dg Chest 2 View  08/10/2011  *RADIOLOGY REPORT*  Clinical Data: Chest pain  CHEST - 2 VIEW  Comparison: CT chest 06/02/2005  Findings: Cardiomediastinal silhouette is unremarkable.  No acute infiltrate or pleural effusion.  No pulmonary edema.  Osteopenia and mild degenerative changes thoracic spine.  There is a vague nodular density in the left upper lobe measures about 1.3 cm.  A lung nodule cannot be excluded.  Further evaluation with non emergent CT scan of the chest is recommended.  IMPRESSION: No active disease. Vague nodular density left upper lobe measures about 1.3 cm.  A lung nodule cannot be excluded.  Further evaluation with non emergent CT scan of the chest is recommended.  Original Report Authenticated By: Natasha Mead, M.D.     1. Chest pain       MDM  Patient was chest pain-free upon my evaluation. Her symptoms had resolved completely with nitroglycerin. She was hemodynamically stable and no longer tachycardic on my violation. EKG had been performed prior to my evaluation did show tachycardia. Patient received aspirin. She had  negative markers at 0 and 3 hours. Chest x-ray and remainder of chest pain workup was unremarkable. Patient did have a possible nodular density noted over the left upper lobe on her chest x-ray. She was notified of this finding and told that she would need to follow this up with a nonemergent chest CT with her primary care provider. Patient is to followup with her cardiologist in clinic. She was given a new prescription for nitroglycerin and she felt that her current prescription was too old. Patient was absolutely hemodynamically stable and chest pain-free throughout my entire period of care. Patient followup with her cardiologist for further management.        Cyndra Numbers, MD 08/11/11 [phone]

## 2011-08-16 ENCOUNTER — Encounter: Payer: Self-pay | Admitting: Physician Assistant

## 2011-08-16 ENCOUNTER — Ambulatory Visit (INDEPENDENT_AMBULATORY_CARE_PROVIDER_SITE_OTHER): Payer: 59 | Admitting: Physician Assistant

## 2011-08-16 VITALS — BP 146/74 | HR 80 | Ht 62.0 in | Wt 129.0 lb

## 2011-08-16 DIAGNOSIS — R918 Other nonspecific abnormal finding of lung field: Secondary | ICD-10-CM | POA: Insufficient documentation

## 2011-08-16 DIAGNOSIS — I2510 Atherosclerotic heart disease of native coronary artery without angina pectoris: Secondary | ICD-10-CM

## 2011-08-16 DIAGNOSIS — R0602 Shortness of breath: Secondary | ICD-10-CM

## 2011-08-16 DIAGNOSIS — R911 Solitary pulmonary nodule: Secondary | ICD-10-CM

## 2011-08-16 DIAGNOSIS — R079 Chest pain, unspecified: Secondary | ICD-10-CM

## 2011-08-16 DIAGNOSIS — I10 Essential (primary) hypertension: Secondary | ICD-10-CM

## 2011-08-16 DIAGNOSIS — E7800 Pure hypercholesterolemia, unspecified: Secondary | ICD-10-CM

## 2011-08-16 MED ORDER — LISINOPRIL 10 MG PO TABS: 10.0000 mg | ORAL_TABLET | Freq: Every day | ORAL | Status: DC

## 2011-08-16 NOTE — Patient Instructions (Signed)
Your physician recommends that you schedule a follow-up appointment in: 2-3 weeks with Dr Eden Emms Your physician recommends that you have lab work drawn today (BNP) and in 1 week (BMP) Your physician has recommended you make the following change in your medication: START Lisinopril 10 mg daily Your physician has requested that you have en exercise stress myoview. For further information please visit [email or web address] Please follow instruction sheet, as given.  Your physician has requested that you have an echocardiogram. Echocardiography is a painless test that uses sound waves to create images of your heart. It provides your doctor with information about the size and shape of your heart and how well your heart's chambers and valves are working. This procedure takes approximately one hour. There are no restrictions for this procedure.  Non-Cardiac CT scanning, (CAT scanning), is a noninvasive, special x-ray that produces cross-sectional images of the body using x-rays and a computer. CT scans help physicians diagnose and treat medical conditions. For some CT exams, a contrast material is used to enhance visibility in the area of the body being studied. CT scans provide greater clarity and reveal more details than regular x-ray exams.

## 2011-08-16 NOTE — Progress Notes (Addendum)
[address] [place], [place]  [address] Phone: [phone] Fax:  [phone]  Date:  08/16/2011   Name:  [name] [name]   DOB:  04/12/1934   MRN:  [id number]  PCP:  Kaleen Mask, MD, MD  Primary Cardiologist:  Dr. Charlton Haws  Primary Electrophysiologist:  None    History of Present Illness: [name] [name] is a 77 y.o. female who returns for follow up after a trip to the ED.  She has a history of CAD, Status post PCI to OM1 08/2006, HTN, hyperlipidemia.  LHC 08/2006: Patent epicardial vessels aside from an OM1 with 90% stenosis, EF 65%.  PCI by Dr. Charlies Constable 08/2006: Taxus Element Perseus DES study stent to the OM1.  Last seen by Dr. Eden Emms 01/2011 with plans for one year follow up.  Patient presented to the emergency room 08/2011 with complaints of chest pain.  Review of the notes indicates her chest pain was similar to prior angina and relieved by NTG x 2.  She was tachycardic upon presentation.  I have reviewed her ECG and there were no acute findings aside from HR 112.  Cardiac markers were negative x2.  Chest x-ray demonstrated a vague nodular density left upper lobe measuring 1.3 cm-lung nodule cannot be excluded and further evaluation with non-emergent CT scan recommended.  Other labs as noted below.  She presents for follow up.  She states that she was working in her daughter's house for several days prior to this episode of CP.  She was lifting, carrying, painting, etc.  She developed an ache in her chest and jaw.  It resolved with 2 NTG.  No syncope.  No recurrent CP since going to the ED.  She did feel short of breath with her CP and nauseated but no diaphoresis.  She tells me that this episode of CP was nothing like her angina.  Her angina in 2008 was very different.  Of note, she has had fairly stable DOE.  Describes Class IIb symptoms.  No orthopnea, PND.  No fatigue or decreased exercise tolerance.     Wt Readings from Last 3 Encounters:  08/16/11  129 lb (58.514 kg)  01/06/11 129 lb (58.514 kg)  01/08/10 131 lb (59.421 kg)     Potassium  Date/Time Value Range Status  08/10/2011  4:34 PM 3.6  3.5-5.1 (mEq/L) Final     Creatinine, Ser  Date/Time Value Range Status  08/10/2011  4:34 PM 1.18* 0.50-1.10 (mg/dL) Final     ALT  Date/Time Value Range Status  08/10/2011  4:34 PM 18  0-35 (U/L) Final     Hemoglobin  Date/Time Value Range Status  08/10/2011  4:34 PM 14.6  12.0-15.0 (g/dL) Final    Past Medical History  Diagnosis Date  . CAD (coronary artery disease)     s/p Taxus Element Perseus Study stent (DES) to OM1 in 08/2006; EF 65%  . HTN (hypertension)   . Anxiety   . Hypercholesterolemia     Current Outpatient Prescriptions  Medication Sig Dispense Refill  . Ascorbic Acid (VITAMIN C) 1000 MG tablet Take 1,000 mg by mouth daily.        Marland Kitchen aspirin 81 MG tablet Take 81 mg by mouth daily.        . Cholecalciferol (VITAMIN D3) 2000 UNITS TABS Take by mouth.        . estrogens, conjugated, (PREMARIN) 0.3 MG tablet Take 0.3 mg by mouth daily. Take daily for 21 days then do not  take for 7 days.      . felodipine (PLENDIL) 10 MG 24 hr tablet Take 10 mg by mouth daily.        . nitroGLYCERIN (NITROSTAT) 0.4 MG SL tablet Place 1 tablet (0.4 mg total) under the tongue every 5 (five) minutes as needed for chest pain.  30 tablet  0  . OMEGA-3 KRILL OIL 300 MG CAPS Take by mouth.        . vitamin B-12 (CYANOCOBALAMIN) 1000 MCG tablet Take 1,000 mcg by mouth daily.          Allergies: No Known Allergies  History  Substance Use Topics  . Smoking status: Former Smoker -- 1.0 packs/day for 40 years    Types: Cigarettes    Quit date: 06/13/2011  . Smokeless tobacco: Never Used  . Alcohol Use: No     ROS:  Please see the history of present illness.    All other systems reviewed and negative.   PHYSICAL EXAM: VS:  BP 146/74  Pulse 80  Ht 5\' 2"  (1.575 m)  Wt 129 lb (58.514 kg)  BMI 23.59 kg/m2 Repeat BP by me on left:  160/90  Well nourished, well developed, in no acute distress HEENT: normal Neck: minimally elevated JVD Cardiac:  normal S1, S2; RRR; no murmur Lungs:  clear to auscultation bilaterally, no wheezing, rhonchi or rales Abd: soft, nontender, no hepatomegaly Ext: trace bilateral LE edema Skin: warm and dry Neuro:  CNs 2-12 intact, no focal abnormalities noted  EKG:  NSR, HR 80, normal axis, BAE, non-specific ST-T changes, no change from prior tracing   ASSESSMENT AND PLAN:  1.  Chest Pain   -  Typical and atypical features.  She is > 4 years out from her PCI.  She had single vessel disease.  Her symptoms last week were not like prior angina.   -  Proceed with ETT-myoview to assess for ischemia.   -  Plan cath if abnormal.  Follow up with Dr. Charlton Haws in 2-3 weeks.   2.  Dyspnea   -  She reports fairly stable symptoms.   -  On exam she appears to have some minimal signs of volume overload.  But, weights stable and no significant change in dyspnea.   -  Check echo.  Check BNP.   -  If BNP high, add low dose furosemide.   -  If BNP ok and Echo ok, suspect dyspnea related to prior smoking/COPD.   3.  Coronary Artery Disease   -  Proceed with myoview as noted.   -  Continue ASA.  4.  Hypertension   -  Uncontrolled.   -  Add Lisinopril 10 mg daily.   -  Check BMET in one week.   5.  Hyperlipidemia   -  Patient unwilling to try a statin.  6.  Lung Nodule   -  Vague density noted LUL.  She had abnormality in this area in 2007 and CT was ok.   -  Prior smoker.   -  D/w radiologist.  He reviewed prior CXR and this density is new.  He suggested non-contrasted CT.   -  Patient hesitant to get CT but agreed.  Will get chest CT ordered.    Signed, Tereso Newcomer, PA-C  3:19 PM 08/16/2011

## 2011-08-18 ENCOUNTER — Telehealth: Payer: Self-pay | Admitting: *Deleted

## 2011-08-18 NOTE — Telephone Encounter (Signed)
Message copied by Tarri Fuller on Wed Aug 18, 2011  5:26 PM ------      Message from: [place], [place] T      Created: Wed Aug 18, 2011  4:20 PM       BNP normal      Tereso Newcomer, [place]  4:20 PM 08/18/2011

## 2011-08-18 NOTE — Telephone Encounter (Signed)
lmom labs normal 

## 2011-08-19 ENCOUNTER — Ambulatory Visit (INDEPENDENT_AMBULATORY_CARE_PROVIDER_SITE_OTHER)
Admission: RE | Admit: 2011-08-19 | Discharge: 2011-08-19 | Disposition: A | Discharge status: Home / Self Care | Payer: 59 | Source: Ambulatory Visit | Attending: Physician Assistant | Admitting: Physician Assistant

## 2011-08-19 DIAGNOSIS — R911 Solitary pulmonary nodule: Secondary | ICD-10-CM

## 2011-08-20 ENCOUNTER — Other Ambulatory Visit: Payer: Self-pay

## 2011-08-20 ENCOUNTER — Ambulatory Visit (HOSPITAL_COMMUNITY): Payer: Medicare PPO | Attending: Internal Medicine

## 2011-08-20 ENCOUNTER — Telehealth: Payer: Self-pay | Admitting: *Deleted

## 2011-08-20 DIAGNOSIS — E785 Hyperlipidemia, unspecified: Secondary | ICD-10-CM | POA: Insufficient documentation

## 2011-08-20 DIAGNOSIS — R072 Precordial pain: Secondary | ICD-10-CM

## 2011-08-20 DIAGNOSIS — R911 Solitary pulmonary nodule: Secondary | ICD-10-CM

## 2011-08-20 DIAGNOSIS — I2510 Atherosclerotic heart disease of native coronary artery without angina pectoris: Secondary | ICD-10-CM | POA: Insufficient documentation

## 2011-08-20 DIAGNOSIS — I079 Rheumatic tricuspid valve disease, unspecified: Secondary | ICD-10-CM | POA: Insufficient documentation

## 2011-08-20 DIAGNOSIS — I059 Rheumatic mitral valve disease, unspecified: Secondary | ICD-10-CM | POA: Insufficient documentation

## 2011-08-20 DIAGNOSIS — I10 Essential (primary) hypertension: Secondary | ICD-10-CM | POA: Insufficient documentation

## 2011-08-20 DIAGNOSIS — R0602 Shortness of breath: Secondary | ICD-10-CM

## 2011-08-20 NOTE — Telephone Encounter (Signed)
Message copied by Tarri Fuller on Fri Aug 20, 2011  9:33 AM ------      Message from: [place], [place] T      Created: Fri Aug 20, 2011  8:21 AM       Spot on CXR benign.      CT does show nodular area in Left Lower Lobe      With significant smoking hx, she should have repeat CT in 6 mos to ensure stability.        - Schedule CT without contrast in 6 mos.            Findings also compatible with COPD.      This goes along with significant smoking hx.        -  I recommend she follow up with PCP to decide +/- testing and treatment for COPD.      Tereso Newcomer, PA-C  8:19 AM 08/20/2011

## 2011-08-20 NOTE — Telephone Encounter (Signed)
pt notified of results today and gave me verbal understanding to the findings as well, states she smoked for 40 + yrs and only quit 3 yrs ago, pt advised to f/u w/PCP for +/- testing for COPD, states she will do that, placed order today for CHEST CT W/O to be done in 6 months., pt aware

## 2011-08-23 ENCOUNTER — Ambulatory Visit (HOSPITAL_COMMUNITY): Payer: Medicare PPO | Attending: Internal Medicine | Admitting: Radiology

## 2011-08-23 VITALS — BP 137/85 | Ht 62.0 in | Wt 145.0 lb

## 2011-08-23 DIAGNOSIS — R079 Chest pain, unspecified: Secondary | ICD-10-CM

## 2011-08-23 DIAGNOSIS — I2510 Atherosclerotic heart disease of native coronary artery without angina pectoris: Secondary | ICD-10-CM

## 2011-08-23 MED ORDER — TECHNETIUM TC 99M TETROFOSMIN IV KIT
11.0000 | PACK | Freq: Once | INTRAVENOUS | Status: AC | PRN
  Administered 2011-08-23: 11 via INTRAVENOUS

## 2011-08-23 MED ORDER — TECHNETIUM TC 99M TETROFOSMIN IV KIT
33.0000 | PACK | Freq: Once | INTRAVENOUS | Status: AC | PRN
  Administered 2011-08-23: 33 via INTRAVENOUS

## 2011-08-23 NOTE — Progress Notes (Signed)
[facility] SITE 3 NUCLEAR MED [address] [place] [address] [phone]  Cardiology Nuclear Med Study  [name] [name] is a 77 y.o. female     MRN : [id number]     DOB: [birth year]  Procedure Date: 08/23/2011  Nuclear Med Background Indication for Stress Test:  Evaluation for Ischemia and 08/10/11 Post Hospital: Chest pain, tachycardia, (-) enzymes History:  1990: Cardiac Arrest during neck surgery at [facility] (per pt) no other info available. 5/08 Heart Cath: EF: 65%  angioplasty/Stent: OMI  Cardiac Risk Factors: History of Smoking, Hypertension and Lipids  Symptoms:  Chest Pain, DOE, Nausea, Palpitations and SOB   Nuclear Pre-Procedure Caffeine/Decaff Intake:  None NPO After: 8:00pm   Lungs:  clear O2 Sat: 95% on room air. IV 0.9% NS with Angio Cath:  20g  IV Site: R Antecubital  IV Started by:  Stanton Kidney, EMT-P  Chest Size (in):  34 Cup Size: C  Height: 5\' 2"  (1.575 m)  Weight:  145 lb (65.772 kg)  BMI:  Body mass index is 26.52 kg/(m^2). Tech Comments:  NA    Nuclear Med Study 1 or 2 day study: 1 day  Stress Test Type:  Stress  Reading MD: Dietrich Pates, MD  Order Authorizing Provider:  P.Nishan MD  Resting Radionuclide: Technetium 63m Tetrofosmin  Resting Radionuclide Dose: 10.9 mCi   Stress Radionuclide:  Technetium 25m Tetrofosmin  Stress Radionuclide Dose: 32.0 mCi           Stress Protocol Rest HR: 69 Stress HR: 130  Rest BP: 137/85 Stress BP: 215/72  Exercise Time (min): 3:44 METS: 5.10   Predicted Max HR: 145 bpm % Max HR: 89.66 bpm Rate Pressure Product: 21308   Dose of Adenosine (mg):  n/a Dose of Lexiscan: n/a mg  Dose of Atropine (mg): n/a Dose of Dobutamine: n/a mcg/kg/min (at max HR)  Stress Test Technologist: Milana Na, EMT-P  Nuclear Technologist:  Domenic Polite, CNMT     Rest Procedure:  Myocardial perfusion imaging was performed at rest 45 minutes following the intravenous administration of Technetium 20m  Tetrofosmin. Rest ECG: NSR - Normal EKG  Stress Procedure:  The patient performed treadmill exercise using a Bruce  Protocol for 3:44 minutes. The patient stopped due to sob, fatigue and denied any chest pain.  There were non specific ST-T wave changes and rare pvcs.  Technetium 29m Tetrofosmin was injected at peak exercise and myocardial perfusion imaging was performed after a brief delay. Stress ECG: No significant change from baseline ECG  QPS Raw Data Images:  Normal; no motion artifact; normal heart/lung ratio. Stress Images:  Normal homogeneous uptake in all areas of the myocardium. Rest Images:  Normal homogeneous uptake in all areas of the myocardium. Subtraction (SDS):  No evidence of ischemia. Transient Ischemic Dilatation (Normal <1.22):  1.05 Lung/Heart Ratio (Normal <0.45):  0.31  Quantitative Gated Spect Images QGS EDV:  46 ml QGS ESV:  10 ml  Impression Exercise Capacity:  Poor exercise capacity. BP Response:  Hypertensive blood pressure response. Clinical Symptoms:  No chest pain. ECG Impression:  No significant ST segment change suggestive of ischemia. Comparison with Prior Nuclear Study:No prior study.}  Overall Impression:  Normal stress nuclear study.  LV Ejection Fraction: 79%.  LV Wall Motion:  NL LV Function; NL Wall Motion  Dietrich Pates

## 2011-08-24 ENCOUNTER — Telehealth: Payer: Self-pay | Admitting: *Deleted

## 2011-08-24 NOTE — Telephone Encounter (Signed)
Message copied by Tarri Fuller on Tue Aug 24, 2011  2:47 PM ------      Message from: [place], [place] T      Created: Tue Aug 24, 2011  2:31 PM       Please inform patient stress test normal.      Tereso Newcomer, PA-C  2:31 PM 08/24/2011

## 2011-08-24 NOTE — Telephone Encounter (Signed)
pt notified of stress test results today and gave verbal understanding 

## 2011-09-15 ENCOUNTER — Encounter: Payer: Self-pay | Admitting: Cardiovascular Disease

## 2011-09-15 ENCOUNTER — Ambulatory Visit (INDEPENDENT_AMBULATORY_CARE_PROVIDER_SITE_OTHER): Payer: Medicare PPO | Admitting: Cardiovascular Disease

## 2011-09-15 VITALS — BP 154/73 | HR 62 | Ht 62.0 in | Wt 127.0 lb

## 2011-09-15 DIAGNOSIS — I739 Peripheral vascular disease, unspecified: Secondary | ICD-10-CM

## 2011-09-15 DIAGNOSIS — M79609 Pain in unspecified limb: Secondary | ICD-10-CM

## 2011-09-15 DIAGNOSIS — E7800 Pure hypercholesterolemia, unspecified: Secondary | ICD-10-CM

## 2011-09-15 DIAGNOSIS — I2510 Atherosclerotic heart disease of native coronary artery without angina pectoris: Secondary | ICD-10-CM

## 2011-09-15 DIAGNOSIS — R0989 Other specified symptoms and signs involving the circulatory and respiratory systems: Secondary | ICD-10-CM | POA: Insufficient documentation

## 2011-09-15 DIAGNOSIS — M79606 Pain in leg, unspecified: Secondary | ICD-10-CM

## 2011-09-15 DIAGNOSIS — I10 Essential (primary) hypertension: Secondary | ICD-10-CM

## 2011-09-15 DIAGNOSIS — R911 Solitary pulmonary nodule: Secondary | ICD-10-CM

## 2011-09-15 NOTE — Assessment & Plan Note (Signed)
Reviewed CT from 5/13 overlying bony abnormality and scarring LUL no evidence of CA. Mild emphysema.  F/U primary

## 2011-09-15 NOTE — Progress Notes (Signed)
Patient ID: [name] [name], female   DOB: [birth year], 77 y.o.   MRN: [id number] [name] [name] returns today for follow up. She has had a previous stent to an OM branch back in 2008. She is doing well. She is not having angina. She is hesitant to take medications. She gets bruising with Plavix and since her stent is 75 years old I told her she could stop this on our last visit in 2011. Last cholesterol checked by [name] [name] 10/11 TC was 258 and LDL was 169. She did mangage to stop smoking last June2010  .  BP has been under good control with Felodipine.  Saw [name] [name] 5/13 with chest pain.  F/U myovue was normal  Myovue 08/23/11 normal with no ischemia and EF 72% reviewed  Having bad leg cramps that may be claudication  ROS: Denies fever, malais, weight loss, blurry vision, decreased visual acuity, cough, sputum, SOB, hemoptysis, pleuritic pain, palpitaitons, heartburn, abdominal pain, melena, lower extremity edema, claudication, or rash.  All other systems reviewed and negative  General: Affect appropriate Healthy:  appears stated age HEENT: normal Neck supple with no adenopathy JVP normal left bruits no thyromegaly Lungs clear with no wheezing and good diaphragmatic motion Heart:  S1/S2 no murmur, no rub, gallop or click PMI normal Abdomen: benighn, BS positve, no tenderness, no AAA no bruit.  No HSM or HJR Distal pulses hard to palpate PT/DP bilaterally No edema Neuro non-focal Skin warm and dry No muscular weakness   Current Outpatient Prescriptions  Medication Sig Dispense Refill  . Ascorbic Acid (VITAMIN C) 1000 MG tablet Take 1,000 mg by mouth daily.        Marland Kitchen aspirin 81 MG tablet Take 81 mg by mouth daily.        . Cholecalciferol (VITAMIN D3) 2000 UNITS TABS Take 1 tablet by mouth daily.       Marland Kitchen estrogens, conjugated, (PREMARIN) 0.3 MG tablet Take 0.3 mg by mouth daily.       . felodipine (PLENDIL) 10 MG 24 hr tablet Take 10 mg by mouth daily.        Marland Kitchen lisinopril (PRINIVIL,ZESTRIL)  10 MG tablet Take 1 tablet (10 mg total) by mouth daily.  90 tablet  3  . nitroGLYCERIN (NITROSTAT) 0.4 MG SL tablet Place 1 tablet (0.4 mg total) under the tongue every 5 (five) minutes as needed for chest pain.  30 tablet  0  . OMEGA-3 KRILL OIL 300 MG CAPS Take by mouth.        . vitamin B-12 (CYANOCOBALAMIN) 1000 MCG tablet Take 1,000 mcg by mouth daily.          Allergies  Review of patient's allergies indicates no known allergies.  Electrocardiogram:  Assessment and Plan

## 2011-09-15 NOTE — Assessment & Plan Note (Signed)
Cholesterol is at goal.  Continue current dose of statin and diet Rx.  No myalgias or side effects.  F/U  LFT's in 6 months. Lab Results  Component Value Date   LDLCALC 115* 09/06/2006

## 2011-09-15 NOTE — Patient Instructions (Addendum)
Your physician has requested that you have a lower extremity arterial duplex with ABI. This test is an ultrasound of the arteries in the legs or arms. It looks at arterial blood flow in the legs and arms. Allow one hour for Lower and Upper Arterial scans. There are no restrictions or special instructions  Your physician has requested that you have a carotid duplex. This test is an ultrasound of the carotid arteries in your neck. It looks at blood flow through these arteries that supply the brain with blood. Allow one hour for this exam. There are no restrictions or special instructions.  Your physician recommends that you continue on your current medications as directed. Please refer to the Current Medication list given to you today.  Your physician wants you to follow-up in: 6 months with Dr. Eden Emms. You will receive a reminder letter in the mail two months in advance. If you don't receive a letter, please call our office to schedule the follow-up appointment.

## 2011-09-15 NOTE — Assessment & Plan Note (Signed)
Well controlled.  Continue current medications and low sodium Dash type diet.    

## 2011-09-15 NOTE — Assessment & Plan Note (Signed)
Previous smoker.  F/U carotid duplex

## 2011-09-15 NOTE — Assessment & Plan Note (Signed)
Stable with no angina and good activity level.  Continue medical Rx No recurrent pain and normal myovue 5/13

## 2011-09-15 NOTE — Assessment & Plan Note (Signed)
Known vascular disease. DP/PT hard to palpate.  ABI's and LE arterial duplex

## 2011-09-17 ENCOUNTER — Encounter (INDEPENDENT_AMBULATORY_CARE_PROVIDER_SITE_OTHER): Payer: Medicare PPO

## 2011-09-17 DIAGNOSIS — R0989 Other specified symptoms and signs involving the circulatory and respiratory systems: Secondary | ICD-10-CM

## 2011-09-17 DIAGNOSIS — I6529 Occlusion and stenosis of unspecified carotid artery: Secondary | ICD-10-CM

## 2011-09-23 ENCOUNTER — Telehealth: Payer: Self-pay | Admitting: Cardiovascular Disease

## 2011-09-23 NOTE — Telephone Encounter (Signed)
PT AWARE OF CAROTID RESULTS ./CY 

## 2011-09-23 NOTE — Telephone Encounter (Signed)
Patient returning nurse call, she can be reached at [phone].

## 2012-09-21 ENCOUNTER — Encounter (INDEPENDENT_AMBULATORY_CARE_PROVIDER_SITE_OTHER): Payer: Medicare PPO

## 2012-09-21 DIAGNOSIS — I6529 Occlusion and stenosis of unspecified carotid artery: Secondary | ICD-10-CM

## 2014-07-09 NOTE — Progress Notes (Signed)
Patient ID: [name] [name], female   DOB: 1934/10/19, 80 y.o.   MRN: [id number] [name] seen today after 3 year absence . She has had a previous stent to an OM branch back in 2008. She is doing well. She is not having angina. She is hesitant to take medications. She gets bruising with Plavix . Cholesterol elevated . She did mangage to stop smoking June2010    Myovue 08/23/11 normal with no ischemia and EF 72% reviewed Carotid 4/01  02-72% LICA stenosis   Had some jaw, chest and sholder pain 06/29/14  Lasted 20 minutes some relief with nitro.    ROS: Denies fever, malais, weight loss, blurry vision, decreased visual acuity, cough, sputum, SOB, hemoptysis, pleuritic pain, palpitaitons, heartburn, abdominal pain, melena, lower extremity edema, claudication, or rash.  All other systems reviewed and negative  General: Affect appropriate Healthy:  appears stated age 80: normal Neck supple with no adenopathy JVP normal left bruits no thyromegaly Lungs clear with no wheezing and good diaphragmatic motion Heart:  S1/S2 no murmur, no rub, gallop or click PMI normal Abdomen: benighn, BS positve, no tenderness, no AAA no bruit.  No HSM or HJR Distal pulses hard to palpate PT/DP bilaterally No edema Neuro non-focal Skin warm and dry No muscular weakness   Current Outpatient Prescriptions  Medication Sig Dispense Refill  . Ascorbic Acid (VITAMIN C) 1000 MG tablet Take 1,000 mg by mouth daily.      Marland Kitchen aspirin 81 MG tablet Take 81 mg by mouth daily.      . Cholecalciferol (VITAMIN D3) 2000 UNITS TABS Take 1 tablet by mouth daily.     Marland Kitchen estrogens, conjugated, (PREMARIN) 0.3 MG tablet Take 0.3 mg by mouth daily.     . felodipine (PLENDIL) 10 MG 24 hr tablet Take 10 mg by mouth daily.      Marland Kitchen lisinopril (PRINIVIL,ZESTRIL) 10 MG tablet Take 1 tablet (10 mg total) by mouth daily. 90 tablet 3  . nitroGLYCERIN (NITROSTAT) 0.4 MG SL tablet Place 1 tablet (0.4 mg total) under the tongue every 5 (five)  minutes as needed for chest pain. 30 tablet 0  . OMEGA-3 KRILL OIL 300 MG CAPS Take by mouth.      . vitamin B-12 (CYANOCOBALAMIN) 1000 MCG tablet Take 1,000 mcg by mouth daily.       No current facility-administered medications for this visit.    Allergies  Review of patient's allergies indicates no known allergies.  Electrocardiogram: May 2013 SR 80 PVC biatrial enlargement   07/10/14  SR rate 71  RAD  Nonspecific ST changes   Assessment and Plan CAD:  Distant history of OM stent  SSCP relieved with nitro  ECG not acute but ST changes  F/U lexiscan myovue HTN:  Well controlled.  Continue current medications and low sodium Dash type diet.   Carotid:  Left bruit known 53% LICA f/u duplex ASA

## 2014-07-10 ENCOUNTER — Encounter: Payer: Self-pay | Admitting: Cardiovascular Disease

## 2014-07-10 ENCOUNTER — Ambulatory Visit (INDEPENDENT_AMBULATORY_CARE_PROVIDER_SITE_OTHER): Payer: Medicare Other | Admitting: Cardiovascular Disease

## 2014-07-10 VITALS — BP 140/78 | HR 71 | Ht 62.0 in | Wt 124.1 lb

## 2014-07-10 DIAGNOSIS — R079 Chest pain, unspecified: Secondary | ICD-10-CM

## 2014-07-10 DIAGNOSIS — E7800 Pure hypercholesterolemia, unspecified: Secondary | ICD-10-CM

## 2014-07-10 DIAGNOSIS — I6529 Occlusion and stenosis of unspecified carotid artery: Secondary | ICD-10-CM

## 2014-07-10 MED ORDER — NITROGLYCERIN 0.4 MG SL SUBL: 0.4000 mg | SUBLINGUAL_TABLET | SUBLINGUAL | Status: AC | PRN

## 2014-07-10 NOTE — Patient Instructions (Signed)
Your physician wants you to follow-up in:  [place] will receive a reminder letter in the mail two months in advance. If you don't receive a letter, please call our office to schedule the follow-up appointment.  Your physician recommends that you continue on your current medications as directed. Please refer to the Current Medication list given to you today.   Your physician has requested that you have a carotid duplex. This test is an ultrasound of the carotid arteries in your neck. It looks at blood flow through these arteries that supply the brain with blood. Allow one hour for this exam. There are no restrictions or special instructions.   Your physician has requested that you have a lexiscan myoview. For further information please visit [email or web address]. Please follow instruction sheet, as given.

## 2014-07-18 ENCOUNTER — Ambulatory Visit (HOSPITAL_BASED_OUTPATIENT_CLINIC_OR_DEPARTMENT_OTHER): Payer: Medicare Other

## 2014-07-18 ENCOUNTER — Ambulatory Visit (HOSPITAL_COMMUNITY): Payer: Medicare Other | Attending: Cardiovascular Disease | Admitting: Radiology

## 2014-07-18 DIAGNOSIS — I10 Essential (primary) hypertension: Secondary | ICD-10-CM | POA: Insufficient documentation

## 2014-07-18 DIAGNOSIS — R9431 Abnormal electrocardiogram [ECG] [EKG]: Secondary | ICD-10-CM | POA: Insufficient documentation

## 2014-07-18 DIAGNOSIS — R079 Chest pain, unspecified: Secondary | ICD-10-CM

## 2014-07-18 DIAGNOSIS — I6523 Occlusion and stenosis of bilateral carotid arteries: Secondary | ICD-10-CM | POA: Diagnosis not present

## 2014-07-18 DIAGNOSIS — I6529 Occlusion and stenosis of unspecified carotid artery: Secondary | ICD-10-CM

## 2014-07-18 DIAGNOSIS — E785 Hyperlipidemia, unspecified: Secondary | ICD-10-CM | POA: Insufficient documentation

## 2014-07-18 MED ORDER — REGADENOSON 0.4 MG/5ML IV SOLN
0.4000 mg | Freq: Once | INTRAVENOUS | Status: AC
  Administered 2014-07-18: 0.4 mg via INTRAVENOUS

## 2014-07-18 MED ORDER — TECHNETIUM TC 99M SESTAMIBI GENERIC - CARDIOLITE
11.0000 | Freq: Once | INTRAVENOUS | Status: AC | PRN
  Administered 2014-07-18: 11 via INTRAVENOUS

## 2014-07-18 MED ORDER — TECHNETIUM TC 99M SESTAMIBI GENERIC - CARDIOLITE
30.0000 | Freq: Once | INTRAVENOUS | Status: AC | PRN
  Administered 2014-07-18: 30 via INTRAVENOUS

## 2014-07-18 NOTE — Progress Notes (Signed)
Carotid duplex scan performed

## 2014-07-18 NOTE — Progress Notes (Signed)
[facility] [address] [place], [place] [address] [phone]    Cardiology Nuclear Med Study  [name] [name] is a 80 y.o. female     MRN : [id number]     DOB: January 26, 1935  Procedure Date: 07/18/2014  Nuclear Med Background Indication for Stress Test:  Evaluation for Ischemia, Stent Patency and Abnormal EKG History:  CAD-Stent, '13 MPI: EF: 72% Cardiac Risk Factors: Carotid Disease, Hypertension and Lipids  Symptoms:  Chest Pain   Nuclear Pre-Procedure Caffeine/Decaff Intake:  None> 12 hrs NPO After: 7:00pm   Lungs:  clear O2 Sat: 95% on room air. IV 0.9% NS with Angio Cath:  22g  IV Site: R Antecubital x 1, tolerated well IV Started by:  Irven Baltimore, RN  Chest Size (in):  34 Cup Size: C  Height: 5\' 2"  (1.575 m)  Weight:  122 lb (55.339 kg)  BMI:  Body mass index is 22.31 kg/(m^2). Tech Comments:  No medications this am. Last dose Lisinopril 24 hrs ago. Irven Baltimore, RN.    Nuclear Med Study 1 or 2 day study: 1 day  Stress Test Type:  Carlton Adam  Reading MD: N/A  Order Authorizing Provider:  Jenkins Rouge, MD  Resting Radionuclide: Technetium 76m Sestamibi  Resting Radionuclide Dose: 11.0 mCi   Stress Radionuclide:  Technetium 34m Sestamibi  Stress Radionuclide Dose: 33.0 mCi           Stress Protocol Rest HR: 67 Stress HR: 102  Rest BP: 158/88 Stress BP: 180/75  Exercise Time (min): n/a METS: n/a   Predicted Max HR: 142 bpm % Max HR: 71.83 bpm Rate Pressure Product: 18360   Dose of Adenosine (mg):  n/a Dose of Lexiscan: 0.4 mg  Dose of Atropine (mg): n/a Dose of Dobutamine: n/a mcg/kg/min (at max HR)  Stress Test Technologist: Perrin Maltese, EMT-P  Nuclear Technologist:  Earl Many, CNMT     Rest Procedure:  Myocardial perfusion imaging was performed at rest 45 minutes following the intravenous administration of Technetium 75m Sestamibi. Rest ECG: NSR 71 bpm  Nonspecific ST T wave changes   Stress Procedure:  The patient  received IV Lexiscan 0.4 mg over 15-seconds.  Technetium 66m Sestamibi injected at 30-seconds. This patient was sob and anxious with the Lexiscan injection. Quantitative spect images were obtained after a 45 minute delay. Stress ECG: No significant change from baseline    QPS Raw Data Images: Soft tissue (diaphragm, bowel activity) underlie heart Stress Images:  Thinning with decreased tracer activity in the inferoseptal wall (base)  Otherwise normal perfusion.  \ Rest Images:  NO significatn change from the stress images   Subtraction (SDS):  No evidence of ischemia. Transient Ischemic Dilatation (Normal <1.22):  1.08 Lung/Heart Ratio (Normal <0.45):  0.29  Quantitative Gated Spect Images QGS EDV:  41 ml QGS ESV:  9 ml  Impression Exercise Capacity:  Lexiscan with low level exercise. BP Response:  Normal blood pressure response. Clinical Symptoms:  No chest pain. ECG Impression:  No significant ST segment change suggestive of ischemia. Comparison with Prior Nuclear Study: Previous study normal    Overall Impression: probable normal perfusion and mild soft tissue attenuation (diaphragm, bowel)  No significant ischemia or scar Overall low risk scan    LV Ejection Fraction: 78%.  LV Wall Motion:  NL LV Function; NL Wall Motion   Dorris Carnes

## 2014-07-22 ENCOUNTER — Telehealth: Payer: Self-pay | Admitting: Cardiovascular Disease

## 2014-07-22 NOTE — Telephone Encounter (Signed)
Follow up  Pt returning Christine's phone call; Please call back and discuss.

## 2014-07-22 NOTE — Telephone Encounter (Signed)
Follow Up  Pt returned call. Please call

## 2014-07-22 NOTE — Telephone Encounter (Signed)
PT  AWARE  OF  TESTS  RESULTS  .Adonis Housekeeper

## 2014-08-04 DIAGNOSIS — J189 Pneumonia, unspecified organism: Secondary | ICD-10-CM

## 2014-08-04 HISTORY — DX: Pneumonia, unspecified organism: J18.9

## 2014-08-23 ENCOUNTER — Inpatient Hospital Stay (HOSPITAL_COMMUNITY)
Admission: EM | Admit: 2014-08-23 | Discharge: 2014-08-26 | DRG: 193 | Disposition: A | Discharge status: Home / Self Care | Payer: Medicare Other | Attending: Internal Medicine | Admitting: Internal Medicine

## 2014-08-23 ENCOUNTER — Encounter (HOSPITAL_COMMUNITY): Payer: Self-pay

## 2014-08-23 ENCOUNTER — Emergency Department (HOSPITAL_COMMUNITY): Payer: Medicare Other

## 2014-08-23 DIAGNOSIS — J9601 Acute respiratory failure with hypoxia: Secondary | ICD-10-CM | POA: Diagnosis present

## 2014-08-23 DIAGNOSIS — E780 Pure hypercholesterolemia: Secondary | ICD-10-CM | POA: Diagnosis present

## 2014-08-23 DIAGNOSIS — I2510 Atherosclerotic heart disease of native coronary artery without angina pectoris: Secondary | ICD-10-CM | POA: Diagnosis present

## 2014-08-23 DIAGNOSIS — Z79899 Other long term (current) drug therapy: Secondary | ICD-10-CM

## 2014-08-23 DIAGNOSIS — R05 Cough: Secondary | ICD-10-CM | POA: Diagnosis not present

## 2014-08-23 DIAGNOSIS — J441 Chronic obstructive pulmonary disease with (acute) exacerbation: Secondary | ICD-10-CM | POA: Diagnosis present

## 2014-08-23 DIAGNOSIS — Z955 Presence of coronary angioplasty implant and graft: Secondary | ICD-10-CM

## 2014-08-23 DIAGNOSIS — Z90710 Acquired absence of both cervix and uterus: Secondary | ICD-10-CM

## 2014-08-23 DIAGNOSIS — Z7982 Long term (current) use of aspirin: Secondary | ICD-10-CM | POA: Diagnosis not present

## 2014-08-23 DIAGNOSIS — E785 Hyperlipidemia, unspecified: Secondary | ICD-10-CM | POA: Diagnosis present

## 2014-08-23 DIAGNOSIS — I10 Essential (primary) hypertension: Secondary | ICD-10-CM | POA: Diagnosis present

## 2014-08-23 DIAGNOSIS — R059 Cough, unspecified: Secondary | ICD-10-CM

## 2014-08-23 DIAGNOSIS — J189 Pneumonia, unspecified organism: Secondary | ICD-10-CM | POA: Diagnosis not present

## 2014-08-23 DIAGNOSIS — Z8249 Family history of ischemic heart disease and other diseases of the circulatory system: Secondary | ICD-10-CM

## 2014-08-23 DIAGNOSIS — Z87891 Personal history of nicotine dependence: Secondary | ICD-10-CM

## 2014-08-23 LAB — CBC WITH DIFFERENTIAL/PLATELET
Basophils Absolute: 0 10*3/uL (ref 0.0–0.1)
Basophils Relative: 0 % (ref 0–1)
EOS ABS: 0.4 10*3/uL (ref 0.0–0.7)
EOS PCT: 4 % (ref 0–5)
HEMATOCRIT: 40.5 % (ref 36.0–46.0)
Hemoglobin: 13.2 g/dL (ref 12.0–15.0)
LYMPHS ABS: 1.7 10*3/uL (ref 0.7–4.0)
LYMPHS PCT: 14 % (ref 12–46)
MCH: 28.8 pg (ref 26.0–34.0)
MCHC: 32.6 g/dL (ref 30.0–36.0)
MCV: 88.2 fL (ref 78.0–100.0)
MONO ABS: 1.9 10*3/uL — AB (ref 0.1–1.0)
Monocytes Relative: 16 % — ABNORMAL HIGH (ref 3–12)
Neutro Abs: 8 10*3/uL — ABNORMAL HIGH (ref 1.7–7.7)
Neutrophils Relative %: 66 % (ref 43–77)
Platelets: 327 10*3/uL (ref 150–400)
RBC: 4.59 MIL/uL (ref 3.87–5.11)
RDW: 12.5 % (ref 11.5–15.5)
WBC: 12.1 10*3/uL — AB (ref 4.0–10.5)

## 2014-08-23 LAB — URINALYSIS, ROUTINE W REFLEX MICROSCOPIC
Bilirubin Urine: NEGATIVE
GLUCOSE, UA: NEGATIVE mg/dL
KETONES UR: NEGATIVE mg/dL
Nitrite: NEGATIVE
PROTEIN: NEGATIVE mg/dL
Specific Gravity, Urine: 1.005 (ref 1.005–1.030)
UROBILINOGEN UA: 0.2 mg/dL (ref 0.0–1.0)
pH: 6 (ref 5.0–8.0)

## 2014-08-23 LAB — BASIC METABOLIC PANEL
Anion gap: 12 (ref 5–15)
BUN: 15 mg/dL (ref 6–20)
CHLORIDE: 101 mmol/L (ref 101–111)
CO2: 26 mmol/L (ref 22–32)
Calcium: 9.4 mg/dL (ref 8.9–10.3)
Creatinine, Ser: 1 mg/dL (ref 0.44–1.00)
GFR, EST NON AFRICAN AMERICAN: 53 mL/min — AB (ref >60)
GLUCOSE: 137 mg/dL — AB (ref 65–99)
Potassium: 3.8 mmol/L (ref 3.5–5.1)
SODIUM: 139 mmol/L (ref 135–145)

## 2014-08-23 LAB — URINE MICROSCOPIC-ADD ON

## 2014-08-23 MED ORDER — ASPIRIN EC 81 MG PO TBEC
81.0000 mg | DELAYED_RELEASE_TABLET | Freq: Every day | ORAL | Status: DC
  Administered 2014-08-23 – 2014-08-26 (×4): 81 mg via ORAL
  Filled 2014-08-23 (×4): qty 1

## 2014-08-23 MED ORDER — SODIUM CHLORIDE 0.9 % IV BOLUS (SEPSIS)
1000.0000 mL | Freq: Once | INTRAVENOUS | Status: AC
  Administered 2014-08-23: 1000 mL via INTRAVENOUS

## 2014-08-23 MED ORDER — DEXTROSE 5 % IV SOLN
1.0000 g | Freq: Every evening | INTRAVENOUS | Status: DC
  Administered 2014-08-24 – 2014-08-25 (×2): 1 g via INTRAVENOUS
  Filled 2014-08-23 (×3): qty 10

## 2014-08-23 MED ORDER — ALBUTEROL SULFATE (2.5 MG/3ML) 0.083% IN NEBU
5.0000 mg | INHALATION_SOLUTION | Freq: Once | RESPIRATORY_TRACT | Status: AC
  Administered 2014-08-23: 5 mg via RESPIRATORY_TRACT
  Filled 2014-08-23: qty 6

## 2014-08-23 MED ORDER — IBUPROFEN 600 MG PO TABS
600.0000 mg | ORAL_TABLET | Freq: Once | ORAL | Status: AC
  Administered 2014-08-23: 600 mg via ORAL
  Filled 2014-08-23: qty 1

## 2014-08-23 MED ORDER — SODIUM CHLORIDE 0.9 % IV SOLN
INTRAVENOUS | Status: DC
  Administered 2014-08-23 – 2014-08-24 (×2): via INTRAVENOUS

## 2014-08-23 MED ORDER — ALPRAZOLAM 0.25 MG PO TABS
0.2500 mg | ORAL_TABLET | Freq: Once | ORAL | Status: AC
  Administered 2014-08-23: 0.25 mg via ORAL
  Filled 2014-08-23: qty 1

## 2014-08-23 MED ORDER — FELODIPINE ER 10 MG PO TB24
10.0000 mg | ORAL_TABLET | Freq: Every day | ORAL | Status: DC
  Administered 2014-08-24 – 2014-08-26 (×3): 10 mg via ORAL
  Filled 2014-08-23 (×3): qty 1

## 2014-08-23 MED ORDER — HEPARIN SODIUM (PORCINE) 5000 UNIT/ML IJ SOLN
5000.0000 [IU] | Freq: Three times a day (TID) | INTRAMUSCULAR | Status: DC
  Administered 2014-08-24 – 2014-08-26 (×7): 5000 [IU] via SUBCUTANEOUS
  Filled 2014-08-23 (×10): qty 1

## 2014-08-23 MED ORDER — AZITHROMYCIN 500 MG PO TABS
500.0000 mg | ORAL_TABLET | Freq: Every evening | ORAL | Status: DC
  Administered 2014-08-23 – 2014-08-25 (×3): 500 mg via ORAL
  Filled 2014-08-23 (×4): qty 1

## 2014-08-23 MED ORDER — DEXTROSE 5 % IV SOLN: 500.0000 mg | INTRAVENOUS | Status: DC

## 2014-08-23 MED ORDER — DEXTROSE 5 % IV SOLN: 1.0000 g | INTRAVENOUS | Status: DC

## 2014-08-23 MED ORDER — IPRATROPIUM BROMIDE 0.02 % IN SOLN
0.5000 mg | Freq: Once | RESPIRATORY_TRACT | Status: AC
  Administered 2014-08-23: 0.5 mg via RESPIRATORY_TRACT
  Filled 2014-08-23: qty 2.5

## 2014-08-23 MED ORDER — DEXTROSE 5 % IV SOLN
1.0000 g | INTRAVENOUS | Status: DC
  Administered 2014-08-23: 1 g via INTRAVENOUS
  Filled 2014-08-23: qty 10

## 2014-08-23 MED ORDER — AZITHROMYCIN 250 MG PO TABS: 500.0000 mg | ORAL_TABLET | ORAL | Status: DC

## 2014-08-23 NOTE — ED Provider Notes (Signed)
CSN: [id number]     Arrival date & time 08/23/14  1423 History   First MD Initiated Contact with Patient 08/23/14 1744     Chief Complaint  Patient presents with  . Weakness     (Consider location/radiation/quality/duration/timing/severity/associated sxs/prior Treatment) HPI Comments: Pt seen by her pcp x 2 this week, started on a zpak without improvement--denies n/v/d--notes myalgias--notes rashes, or photophobia--used motrin as well--  Patient is a 80 y.o. female presenting with weakness. The history is provided by the patient and the spouse.  Weakness This is a new problem. The current episode started more than 2 days ago. The problem occurs constantly. The problem has been gradually worsening. Associated symptoms include shortness of breath. Nothing aggravates the symptoms. Nothing relieves the symptoms.    Past Medical History  Diagnosis Date  . CAD (coronary artery disease)     s/p Taxus Element Perseus Study stent (DES) to OM1 in 08/2006; EF 65%  . HTN (hypertension)   . Anxiety   . Hypercholesterolemia    Past Surgical History  Procedure Laterality Date  . Pci of hte lesion in th marginal branch circumflex artery    . Lower back surgery  1976  . Partial hysterectomy  1977  . Neck surgery  1990  . Appendectomy  1950  . Coronary angioplasty with stent placement     Family History  Problem Relation Age of Onset  . Heart failure Mother   . Hypertension Mother    History  Substance Use Topics  . Smoking status: Former Smoker -- 1.00 packs/day for 40 years    Types: Cigarettes    Quit date: 06/13/2011  . Smokeless tobacco: Never Used  . Alcohol Use: No   OB History    No data available     Review of Systems  Respiratory: Positive for shortness of breath.   Neurological: Positive for weakness.  All other systems reviewed and are negative.     Allergies  Review of patient's allergies indicates no known allergies.  Home Medications   Prior to Admission  medications   Medication Sig Start Date End Date Taking? Authorizing Provider  Ascorbic Acid (VITAMIN C) 1000 MG tablet Take 1,000 mg by mouth as needed (when not feeling well).    Yes Historical Provider, MD  aspirin 81 MG tablet Take 81 mg by mouth daily.     Yes Historical Provider, MD  azithromycin (ZITHROMAX) 250 MG tablet Take 250-500 mg by mouth daily. Take 2 tablets by mouth  Immediately, and 1 tablet daily for 4 days. 08/21/14  Yes Historical Provider, MD  felodipine (PLENDIL) 10 MG 24 hr tablet Take 10 mg by mouth daily.     Yes Historical Provider, MD  nitroGLYCERIN (NITROSTAT) 0.4 MG SL tablet Place 1 tablet (0.4 mg total) under the tongue every 5 (five) minutes as needed for chest pain. 07/10/14  Yes Josue Hector, MD  lisinopril (PRINIVIL,ZESTRIL) 10 MG tablet Take 1 tablet (10 mg total) by mouth daily. 08/16/11 08/15/12  Liliane Shi, PA-C  nitroGLYCERIN (NITROSTAT) 0.4 MG SL tablet Place 1 tablet (0.4 mg total) under the tongue every 5 (five) minutes as needed for chest pain. 08/10/11 08/09/12  Meagan Hunt, MD   BP 148/68 mmHg  Pulse 109  Temp(Src) 97 F (36.1 C) (Oral)  Resp 16  SpO2 98% Physical Exam  Constitutional: She is oriented to person, place, and time. She appears well-developed and well-nourished.  Non-toxic appearance. No distress.  HENT:  Head: Normocephalic and atraumatic.  Eyes: Conjunctivae, EOM and lids are normal. Pupils are equal, round, and reactive to light.  Neck: Normal range of motion. Neck supple. No tracheal deviation present. No thyroid mass present.  Cardiovascular: Regular rhythm and normal heart sounds.  Tachycardia present.  Exam reveals no gallop.   No murmur heard. Pulmonary/Chest: Effort normal. No stridor. No respiratory distress. She has decreased breath sounds. She has wheezes. She has no rhonchi. She has no rales.  Abdominal: Soft. Normal appearance and bowel sounds are normal. She exhibits no distension. There is no tenderness. There is no  rebound and no CVA tenderness.  Musculoskeletal: Normal range of motion. She exhibits no edema or tenderness.  Neurological: She is alert and oriented to person, place, and time. She has normal strength. No cranial nerve deficit or sensory deficit. GCS eye subscore is 4. GCS verbal subscore is 5. GCS motor subscore is 6.  Skin: Skin is warm and dry. No abrasion and no rash noted.  Psychiatric: She has a normal mood and affect. Her speech is normal and behavior is normal.  Nursing note and vitals reviewed.   ED Course  Procedures (including critical care time) Labs Review Labs Reviewed  CBC WITH DIFFERENTIAL/PLATELET - Abnormal; Notable for the following:    WBC 12.1 (*)    Neutro Abs 8.0 (*)    Monocytes Relative 16 (*)    Monocytes Absolute 1.9 (*)    All other components within normal limits  BASIC METABOLIC PANEL - Abnormal; Notable for the following:    Glucose, Bld 137 (*)    GFR calc non Af Amer 53 (*)    All other components within normal limits  URINALYSIS, ROUTINE W REFLEX MICROSCOPIC - Abnormal; Notable for the following:    Hgb urine dipstick SMALL (*)    Leukocytes, UA SMALL (*)    All other components within normal limits  URINE MICROSCOPIC-ADD ON - Abnormal; Notable for the following:    Bacteria, UA FEW (*)    All other components within normal limits    Imaging Review No results found.   EKG Interpretation None      MDM   Final diagnoses:  Cough    Patient started on Rocephin and Zithromax and will be admitted to the hospital    Lacretia Leigh, MD 08/23/14 2016

## 2014-08-23 NOTE — ED Notes (Addendum)
Pt states sick x 1 week. Back pain, arm pain and weakness - symptoms all bilateral and generalized.  No confusion or slurred speech.  No appetite.  Some nausea today.  Went to primary x 2.  Second time given z pack.  No improvement.  Pt with productive cough.  Had cxr yesterday which was negative for pneumonia.

## 2014-08-23 NOTE — H&P (Signed)
[facility] History and Physical  Kenniya Westrich NOB:[id number] DOB: [birth year] DOA: 08/23/2014  Referring physician: EDP PCP: Leonard Downing, MD   Chief Complaint: Cough, SOB   HPI: [name] [name] is a 80 y.o. female who presents to the ED with 1 week history of cough and SOB.  She was seen by her PCP a couple of days ago, started on a Z-pack.  Since then her symptoms have persisted and been worsening.  Worsening SOB, cough, congestion, generalized weakness.  Nothing makes symptoms better or worse.  No N/V/D.  Review of Systems: Systems reviewed.  As above, otherwise negative  Past Medical History  Diagnosis Date  . CAD (coronary artery disease)     s/p Taxus Element Perseus Study stent (DES) to OM1 in 08/2006; EF 65%  . HTN (hypertension)   . Anxiety   . Hypercholesterolemia    Past Surgical History  Procedure Laterality Date  . Pci of hte lesion in th marginal branch circumflex artery    . Lower back surgery  1976  . Partial hysterectomy  1977  . Neck surgery  1990  . Appendectomy  1950  . Coronary angioplasty with stent placement     Social History:  reports that she quit smoking about 3 years ago. Her smoking use included Cigarettes. She has a 40 pack-year smoking history. She has never used smokeless tobacco. She reports that she does not drink alcohol or use illicit drugs.  No Known Allergies  Family History  Problem Relation Age of Onset  . Heart failure Mother   . Hypertension Mother      Prior to Admission medications   Medication Sig Start Date End Date Taking? Authorizing Provider  Ascorbic Acid (VITAMIN C) 1000 MG tablet Take 1,000 mg by mouth as needed (when not feeling well).    Yes Historical Provider, MD  aspirin 81 MG tablet Take 81 mg by mouth daily.     Yes Historical Provider, MD  azithromycin (ZITHROMAX) 250 MG tablet Take 250-500 mg by mouth daily. Take 2 tablets by mouth  Immediately, and 1 tablet daily for 4 days. 08/21/14  Yes Historical  Provider, MD  felodipine (PLENDIL) 10 MG 24 hr tablet Take 10 mg by mouth daily.     Yes Historical Provider, MD  nitroGLYCERIN (NITROSTAT) 0.4 MG SL tablet Place 1 tablet (0.4 mg total) under the tongue every 5 (five) minutes as needed for chest pain. 07/10/14  Yes Josue Hector, MD  lisinopril (PRINIVIL,ZESTRIL) 10 MG tablet Take 1 tablet (10 mg total) by mouth daily. 08/16/11 08/15/12  Liliane Shi, PA-C  nitroGLYCERIN (NITROSTAT) 0.4 MG SL tablet Place 1 tablet (0.4 mg total) under the tongue every 5 (five) minutes as needed for chest pain. 08/10/11 08/09/12  Chauncy Passy, MD   Physical Exam: Filed Vitals:   08/23/14 1941  BP: 154/74  Pulse: 110  Temp:   Resp: 20    BP 154/74 mmHg  Pulse 110  Temp(Src) 97 F (36.1 C) (Oral)  Resp 20  SpO2 100%  General Appearance:    Alert, oriented, no distress, appears stated age  Head:    Normocephalic, atraumatic  Eyes:    PERRL, EOMI, sclera non-icteric        Nose:   Nares without drainage or epistaxis. Mucosa, turbinates normal  Throat:   Moist mucous membranes. Oropharynx without erythema or exudate.  Neck:   Supple. No carotid bruits.  No thyromegaly.  No lymphadenopathy.   Back:     No  CVA tenderness, no spinal tenderness  Lungs:     Wheezing left lower lung fields  Chest wall:    No tenderness to palpitation  Heart:    Regular rate and rhythm without murmurs, gallops, rubs  Abdomen:     Soft, non-tender, nondistended, normal bowel sounds, no organomegaly  Genitalia:    deferred  Rectal:    deferred  Extremities:   No clubbing, cyanosis or edema.  Pulses:   2+ and symmetric all extremities  Skin:   Skin color, texture, turgor normal, no rashes or lesions  Lymph nodes:   Cervical, supraclavicular, and axillary nodes normal  Neurologic:   CNII-XII intact. Normal strength, sensation and reflexes      throughout    Labs on Admission:  Basic Metabolic Panel:  Recent Labs Lab 08/23/14 1504  NA 139  K 3.8  CL 101  CO2 26   GLUCOSE 137*  BUN 15  CREATININE 1.00  CALCIUM 9.4   Liver Function Tests: No results for input(s): AST, ALT, ALKPHOS, BILITOT, PROT, ALBUMIN in the last 168 hours. No results for input(s): LIPASE, AMYLASE in the last 168 hours. No results for input(s): AMMONIA in the last 168 hours. CBC:  Recent Labs Lab 08/23/14 1504  WBC 12.1*  NEUTROABS 8.0*  HGB 13.2  HCT 40.5  MCV 88.2  PLT 327   Cardiac Enzymes: No results for input(s): CKTOTAL, CKMB, CKMBINDEX, TROPONINI in the last 168 hours.  BNP (last 3 results) No results for input(s): PROBNP in the last 8760 hours. CBG: No results for input(s): GLUCAP in the last 168 hours.  Radiological Exams on Admission: Dg Chest 2 View  08/23/2014   CLINICAL DATA:  Productive cough the last week. Upper chest pain radiates into the back. History of hypertension and smoking.  EXAM: CHEST  2 VIEW  COMPARISON:  08/10/2011 and 06/02/2005  FINDINGS: Heart size is normal. There is minimal streaky density within the right middle lobe consistent with atelectasis or developing infiltrate. Bronchitic changes are present. No pulmonary edema. Note is made of pectus deformity.  IMPRESSION: Right middle lobe atelectasis or infiltrate.  Followup PA and lateral chest X-ray is recommended in 3-4 weeks following trial of antibiotic therapy to ensure resolution and exclude underlying malignancy.   Electronically Signed   By: Nolon Nations M.D.   On: 08/23/2014 18:27    EKG: Independently reviewed.  Assessment/Plan Active Problems:   CAP (community acquired pneumonia)   1. CAP - patient with CAP identified on CXR, not getting better with outpatient azithromycin. 1. Admit, putting on CAP pathway 2. Rocephin / azithromycin 3. Cultures pending 4. O2 via [place] as needed    Code Status: Full Code  Family Communication: Family at bedside Disposition Plan: Admit to inpatient   Time spent: 50 min  Mazella Deen M. [facility] Pager  [phone]  If 7AM-7PM, please contact the day team taking care of the patient [email or web address] Password [facility] 08/23/2014, 8:24 PM

## 2014-08-24 ENCOUNTER — Encounter (HOSPITAL_COMMUNITY): Payer: Self-pay | Admitting: *Deleted

## 2014-08-24 DIAGNOSIS — J189 Pneumonia, unspecified organism: Principal | ICD-10-CM

## 2014-08-24 DIAGNOSIS — I10 Essential (primary) hypertension: Secondary | ICD-10-CM

## 2014-08-24 LAB — EXPECTORATED SPUTUM ASSESSMENT W GRAM STAIN, RFLX TO RESP C

## 2014-08-24 LAB — STREP PNEUMONIAE URINARY ANTIGEN: STREP PNEUMO URINARY ANTIGEN: NEGATIVE

## 2014-08-24 LAB — EXPECTORATED SPUTUM ASSESSMENT W REFEX TO RESP CULTURE

## 2014-08-24 LAB — HIV ANTIBODY (ROUTINE TESTING W REFLEX): HIV SCREEN 4TH GENERATION: NONREACTIVE

## 2014-08-24 MED ORDER — ALBUTEROL SULFATE (2.5 MG/3ML) 0.083% IN NEBU: 5.0000 mg | INHALATION_SOLUTION | Freq: Four times a day (QID) | RESPIRATORY_TRACT | Status: DC | PRN

## 2014-08-24 MED ORDER — OXYCODONE HCL 5 MG PO TABS
5.0000 mg | ORAL_TABLET | Freq: Four times a day (QID) | ORAL | Status: DC | PRN
  Administered 2014-08-24 – 2014-08-26 (×4): 5 mg via ORAL
  Filled 2014-08-24 (×4): qty 1

## 2014-08-24 NOTE — Progress Notes (Signed)
PROGRESS NOTE    [name] [name] NTI:[id number] DOB: [birth year] DOA: 08/23/2014 PCP: Leonard Downing, MD  HPI/Brief narrative 80 y.o. Female with h/o CAD s/p DES (2008), HTN, HLD, who presented to the ED with 1 week history of cough and SOB. She was seen by her PCP a couple of days ago, started on a Z-pack.Since then her symptoms have persisted and been worsening. Worsening SOB, cough, congestion, generalized weakness. Admitted for possible CAP.   Assessment/Plan:  RML community-acquired pneumonia - Failed outpatient PO azithromycin treatment - Treating empirically with IV Rocephin and azithromycin - Improving clinically - Possible DC in 24-48 hours on oral antibiotics - We will need follow-up chest x-ray in 4-6 weeks to ensure resolution of pneumonia findings - Former smoker - Follow sputum culture results  Essential hypertension - Controlled - Continue felodipine  CAD - Asymptomatic - Continue aspirin  DVT prophylaxis: Subcutaneous heparin Code Status: Full Family Communication: None at bedside Disposition Plan: Home possibly in 1-2 days   Consultants:  None  Procedures:  None  Antibiotics:  IV Rocephin 5/20 >  IV azithromycin 5/20 >   Subjective: Feels much better. No chest pain. No fevers. Cough with intermittent white sputum.  Objective: Filed Vitals:   08/23/14 1941 08/23/14 2045 08/23/14 2143 08/24/14 0650  BP: 154/74 145/51 146/60 139/68  Pulse: 110 116 105 80  Temp:  97.9 F (36.6 C) 99.3 F (37.4 C) 98.2 F (36.8 C)  TempSrc:  Oral Oral Oral  Resp: '20 18 18 18  '$ Height:   5' (1.524 m)   Weight:   55.8 kg (123 lb 0.3 oz)   SpO2: 100% 91% 97% 97%    Intake/Output Summary (Last 24 hours) at 08/24/14 1314 Last data filed at 08/24/14 1217  Gross per 24 hour  Intake   1240 ml  Output    350 ml  Net    890 ml   Filed Weights   08/23/14 2143  Weight: 55.8 kg (123 lb 0.3 oz)     Exam:  General exam: Pleasant elderly female  sitting up comfortably in bed. Respiratory system: Occasional right basal crackles but otherwise clear to auscultation. No increased work of breathing. Cardiovascular system: S1 & S2 heard, RRR. No JVD, murmurs, gallops, clicks or pedal edema. Gastrointestinal system: Abdomen is nondistended, soft and nontender. Normal bowel sounds heard. Central nervous system: Alert and oriented. No focal neurological deficits. Extremities: Symmetric 5 x 5 power.   Data Reviewed: Basic Metabolic Panel:  Recent Labs Lab 08/23/14 1504  NA 139  K 3.8  CL 101  CO2 26  GLUCOSE 137*  BUN 15  CREATININE 1.00  CALCIUM 9.4   Liver Function Tests: No results for input(s): AST, ALT, ALKPHOS, BILITOT, PROT, ALBUMIN in the last 168 hours. No results for input(s): LIPASE, AMYLASE in the last 168 hours. No results for input(s): AMMONIA in the last 168 hours. CBC:  Recent Labs Lab 08/23/14 1504  WBC 12.1*  NEUTROABS 8.0*  HGB 13.2  HCT 40.5  MCV 88.2  PLT 327   Cardiac Enzymes: No results for input(s): CKTOTAL, CKMB, CKMBINDEX, TROPONINI in the last 168 hours. BNP (last 3 results) No results for input(s): PROBNP in the last 8760 hours. CBG: No results for input(s): GLUCAP in the last 168 hours.  Recent Results (from the past 240 hour(s))  Culture, sputum-assessment     Status: None   Collection Time: 08/24/14 11:09 AM  Result Value Ref Range Status   Specimen Description SPUTUM  Final  Special Requests NONE  Final   Sputum evaluation   Final    THIS SPECIMEN IS ACCEPTABLE. RESPIRATORY CULTURE REPORT TO FOLLOW.   Report Status 08/24/2014 FINAL  Final         Studies: Dg Chest 2 View  08/23/2014   CLINICAL DATA:  Productive cough the last week. Upper chest pain radiates into the back. History of hypertension and smoking.  EXAM: CHEST  2 VIEW  COMPARISON:  08/10/2011 and 06/02/2005  FINDINGS: Heart size is normal. There is minimal streaky density within the right middle lobe consistent  with atelectasis or developing infiltrate. Bronchitic changes are present. No pulmonary edema. Note is made of pectus deformity.  IMPRESSION: Right middle lobe atelectasis or infiltrate.  Followup PA and lateral chest X-ray is recommended in 3-4 weeks following trial of antibiotic therapy to ensure resolution and exclude underlying malignancy.   Electronically Signed   By: Nolon Nations M.D.   On: 08/23/2014 18:27        Scheduled Meds: . aspirin EC  81 mg Oral Daily  . azithromycin  500 mg Oral QPM  . cefTRIAXone (ROCEPHIN)  IV  1 g Intravenous QPM  . felodipine  10 mg Oral Daily  . heparin  5,000 Units Subcutaneous 3 times per day   Continuous Infusions: . sodium chloride 125 mL/hr at 08/24/14 1238    Principal Problem:   CAP (community acquired pneumonia) Active Problems:   Essential hypertension    Time spent: 15 minutes    Jarian Longoria, MD, FACP, FHM. [facility] Pager [phone]  If 7PM-7AM, please contact night-coverage [email or web address] Password [email or web address] 08/24/2014, 1:14 PM    LOS: 1 day

## 2014-08-24 NOTE — Progress Notes (Signed)
Pt sats 97% on 2L [place].  Weaned pt to room air.  Upon entering room and pt bathing, pt sats 85%.  Put pt back on 2L [place].  Patient audible wheezing while bathing.  Will try to wean to 1L later today.

## 2014-08-24 NOTE — Progress Notes (Signed)
Patient 98% on 2L [place] while resting in bed.  Weaned to 1L [place].

## 2014-08-25 DIAGNOSIS — J9601 Acute respiratory failure with hypoxia: Secondary | ICD-10-CM

## 2014-08-25 DIAGNOSIS — J441 Chronic obstructive pulmonary disease with (acute) exacerbation: Secondary | ICD-10-CM

## 2014-08-25 MED ORDER — ALBUTEROL SULFATE (2.5 MG/3ML) 0.083% IN NEBU: 2.5000 mg | INHALATION_SOLUTION | Freq: Four times a day (QID) | RESPIRATORY_TRACT | Status: DC | PRN

## 2014-08-25 MED ORDER — IPRATROPIUM-ALBUTEROL 0.5-2.5 (3) MG/3ML IN SOLN
3.0000 mL | RESPIRATORY_TRACT | Status: DC
  Administered 2014-08-25: 3 mL via RESPIRATORY_TRACT
  Filled 2014-08-25: qty 3

## 2014-08-25 MED ORDER — METHYLPREDNISOLONE SODIUM SUCC 40 MG IJ SOLR
40.0000 mg | Freq: Two times a day (BID) | INTRAMUSCULAR | Status: DC
  Administered 2014-08-25 (×2): 40 mg via INTRAVENOUS
  Filled 2014-08-25 (×4): qty 1

## 2014-08-25 MED ORDER — IPRATROPIUM-ALBUTEROL 0.5-2.5 (3) MG/3ML IN SOLN
3.0000 mL | Freq: Four times a day (QID) | RESPIRATORY_TRACT | Status: DC
  Administered 2014-08-25 – 2014-08-26 (×3): 3 mL via RESPIRATORY_TRACT
  Filled 2014-08-25 (×4): qty 3

## 2014-08-25 MED ORDER — ALBUTEROL SULFATE (2.5 MG/3ML) 0.083% IN NEBU: 2.5000 mg | INHALATION_SOLUTION | RESPIRATORY_TRACT | Status: DC | PRN

## 2014-08-25 NOTE — Progress Notes (Addendum)
PROGRESS NOTE    [name] [name] FYB:[id number] DOB: 03-Dec-1934 DOA: 08/23/2014 PCP: Leonard Downing, MD  HPI/Brief narrative 80 y.o. Female with h/o CAD s/p DES (2008), HTN, HLD, who presented to the ED with 1 week history of cough and SOB. She was seen by her PCP a couple of days ago, started on a Z-pack.Since then her symptoms have persisted and been worsening. Worsening SOB, cough, congestion, generalized weakness. Admitted for possible CAP.   Assessment/Plan:  RML community-acquired pneumonia - Failed outpatient PO azithromycin treatment - Treating empirically with IV Rocephin and azithromycin - Improving clinically - Possible DC in 24-48 hours on oral antibiotics - We will need follow-up chest x-ray in 4-6 weeks to ensure resolution of pneumonia findings - Former smoker - Follow sputum culture results: Pending - Blood culture 2: Negative to date  COPD exacerbation - Patient gives 50-pack-year smoking history - Likely precipitated by pneumonia - Start Solu-Medrol 40 mg IV twice a day and transition low-dose prednisone taper at discharge - Oxygen, bronchodilator nebulizations and flutter valve  Acute hypoxic respiratory failure - Secondary to COPD exacerbation and pneumonia - Management as above  Essential hypertension - Controlled - Continue felodipine  CAD - Asymptomatic - Continue aspirin  DVT prophylaxis: Subcutaneous heparin Code Status: Full Family Communication: Discussed with spouse on 5/22. Disposition Plan: Home possibly in 1-2 days   Consultants:  None  Procedures:  None  Antibiotics:  IV Rocephin 5/20 >  IV azithromycin 5/20 >   Subjective: Mostly dry cough. Denies chest pain or back pain. Even though patient denies dyspnea, she appears mildly dyspneic.  Objective: Filed Vitals:   08/24/14 2333 08/25/14 0612 08/25/14 1130 08/25/14 1136  BP: 132/54 141/51    Pulse: 77 76    Temp: 98.2 F (36.8 C) 98 F (36.7 C)    TempSrc:  Oral Oral    Resp: 16 19    Height:      Weight:  54.8 kg (120 lb 13 oz)    SpO2: 97% 97% 92% 92%    Intake/Output Summary (Last 24 hours) at 08/25/14 1350 Last data filed at 08/25/14 1017  Gross per 24 hour  Intake    600 ml  Output   2600 ml  Net  -2000 ml   Filed Weights   08/23/14 2143 08/25/14 0612  Weight: 55.8 kg (123 lb 0.3 oz) 54.8 kg (120 lb 13 oz)     Exam:  General exam: Pleasant elderly female lying propped up in bed with mild increased work of breathing Respiratory system: Occasional right basal crackles. Decreased breath sounds bilaterally with scattered few medium post expiratory rhonchi. Mild increased work of breathing. Cardiovascular system: S1 & S2 heard, RRR. No JVD, murmurs, gallops, clicks or pedal edema. Gastrointestinal system: Abdomen is nondistended, soft and nontender. Normal bowel sounds heard. Central nervous system: Alert and oriented. No focal neurological deficits. Extremities: Symmetric 5 x 5 power.   Data Reviewed: Basic Metabolic Panel:  Recent Labs Lab 08/23/14 1504  NA 139  K 3.8  CL 101  CO2 26  GLUCOSE 137*  BUN 15  CREATININE 1.00  CALCIUM 9.4   Liver Function Tests: No results for input(s): AST, ALT, ALKPHOS, BILITOT, PROT, ALBUMIN in the last 168 hours. No results for input(s): LIPASE, AMYLASE in the last 168 hours. No results for input(s): AMMONIA in the last 168 hours. CBC:  Recent Labs Lab 08/23/14 1504  WBC 12.1*  NEUTROABS 8.0*  HGB 13.2  HCT 40.5  MCV 88.2  PLT 327   Cardiac Enzymes: No results for input(s): CKTOTAL, CKMB, CKMBINDEX, TROPONINI in the last 168 hours. BNP (last 3 results) No results for input(s): PROBNP in the last 8760 hours. CBG: No results for input(s): GLUCAP in the last 168 hours.  Recent Results (from the past 240 hour(s))  Culture, blood (routine x 2) Call MD if unable to obtain prior to antibiotics being given     Status: None (Preliminary result)   Collection Time: 08/23/14   9:13 PM  Result Value Ref Range Status   Specimen Description BLOOD LFARM  Final   Special Requests BOTTLES DRAWN AEROBIC AND ANAEROBIC 4CC  Final   Culture   Final           BLOOD CULTURE RECEIVED NO GROWTH TO DATE CULTURE WILL BE HELD FOR 5 DAYS BEFORE ISSUING A FINAL NEGATIVE REPORT Performed at [facility]    Report Status PENDING  Incomplete  Culture, blood (routine x 2) Call MD if unable to obtain prior to antibiotics being given     Status: None (Preliminary result)   Collection Time: 08/23/14  9:20 PM  Result Value Ref Range Status   Specimen Description BLOOD LAC  Final   Special Requests BOTTLES DRAWN AEROBIC AND ANAEROBIC 4CC  Final   Culture   Final           BLOOD CULTURE RECEIVED NO GROWTH TO DATE CULTURE WILL BE HELD FOR 5 DAYS BEFORE ISSUING A FINAL NEGATIVE REPORT Performed at [facility]    Report Status PENDING  Incomplete  Culture, sputum-assessment     Status: None   Collection Time: 08/24/14 11:09 AM  Result Value Ref Range Status   Specimen Description SPUTUM  Final   Special Requests NONE  Final   Sputum evaluation   Final    THIS SPECIMEN IS ACCEPTABLE. RESPIRATORY CULTURE REPORT TO FOLLOW.   Report Status 08/24/2014 FINAL  Final  Culture, respiratory (NON-Expectorated)     Status: None (Preliminary result)   Collection Time: 08/24/14 11:09 AM  Result Value Ref Range Status   Specimen Description SPUTUM  Final   Special Requests NONE  Final   Gram Stain PENDING  Incomplete   Culture   Final    Culture reincubated for better growth Performed at [facility]    Report Status PENDING  Incomplete         Studies: Dg Chest 2 View  08/23/2014   CLINICAL DATA:  Productive cough the last week. Upper chest pain radiates into the back. History of hypertension and smoking.  EXAM: CHEST  2 VIEW  COMPARISON:  08/10/2011 and 06/02/2005  FINDINGS: Heart size is normal. There is minimal streaky density within the right middle lobe  consistent with atelectasis or developing infiltrate. Bronchitic changes are present. No pulmonary edema. Note is made of pectus deformity.  IMPRESSION: Right middle lobe atelectasis or infiltrate.  Followup PA and lateral chest X-ray is recommended in 3-4 weeks following trial of antibiotic therapy to ensure resolution and exclude underlying malignancy.   Electronically Signed   By: Nolon Nations M.D.   On: 08/23/2014 18:27        Scheduled Meds: . aspirin EC  81 mg Oral Daily  . azithromycin  500 mg Oral QPM  . cefTRIAXone (ROCEPHIN)  IV  1 g Intravenous QPM  . felodipine  10 mg Oral Daily  . heparin  5,000 Units Subcutaneous 3 times per day  . ipratropium-albuterol  3 mL Nebulization  Q6H  . methylPREDNISolone (SOLU-MEDROL) injection  40 mg Intravenous BID   Continuous Infusions:    Principal Problem:   CAP (community acquired pneumonia) Active Problems:   Essential hypertension    Time spent: 37 minutes    Cerra Eisenhower, MD, FACP, FHM. [facility] Pager [phone]  If 7PM-7AM, please contact night-coverage [email or web address] Password [email or web address] 08/25/2014, 1:50 PM    LOS: 2 days

## 2014-08-26 ENCOUNTER — Inpatient Hospital Stay (HOSPITAL_COMMUNITY): Payer: Medicare Other

## 2014-08-26 LAB — LEGIONELLA ANTIGEN, URINE

## 2014-08-26 MED ORDER — PREDNISONE 20 MG PO TABS
40.0000 mg | ORAL_TABLET | Freq: Every day | ORAL | Status: DC
  Administered 2014-08-26: 40 mg via ORAL
  Filled 2014-08-26 (×2): qty 2

## 2014-08-26 MED ORDER — PREDNISONE 10 MG PO TABS: ORAL_TABLET | ORAL | Status: DC

## 2014-08-26 MED ORDER — CEFPODOXIME PROXETIL 200 MG PO TABS: 200.0000 mg | ORAL_TABLET | Freq: Two times a day (BID) | ORAL | Status: DC

## 2014-08-26 MED ORDER — AZITHROMYCIN 500 MG PO TABS: 500.0000 mg | ORAL_TABLET | Freq: Every evening | ORAL | Status: DC

## 2014-08-26 MED ORDER — ALBUTEROL SULFATE HFA 108 (90 BASE) MCG/ACT IN AERS: 1.0000 | INHALATION_SPRAY | RESPIRATORY_TRACT | Status: DC | PRN

## 2014-08-26 NOTE — Discharge Instructions (Signed)
Pneumonia Pneumonia is an infection of the lungs.  CAUSES Pneumonia may be caused by bacteria or a virus. Usually, these infections are caused by breathing infectious particles into the lungs (respiratory tract). SIGNS AND SYMPTOMS   Cough.  Fever.  Chest pain.  Increased rate of breathing.  Wheezing.  Mucus production. DIAGNOSIS  If you have the common symptoms of pneumonia, your health care provider will typically confirm the diagnosis with a chest X-ray. The X-ray will show an abnormality in the lung (pulmonary infiltrate) if you have pneumonia. Other tests of your blood, urine, or sputum may be done to find the specific cause of your pneumonia. Your health care provider may also do tests (blood gases or pulse oximetry) to see how well your lungs are working. TREATMENT  Some forms of pneumonia may be spread to other people when you cough or sneeze. You may be asked to wear a mask before and during your exam. Pneumonia that is caused by bacteria is treated with antibiotic medicine. Pneumonia that is caused by the influenza virus may be treated with an antiviral medicine. Most other viral infections must run their course. These infections will not respond to antibiotics.  HOME CARE INSTRUCTIONS   Cough suppressants may be used if you are losing too much rest. However, coughing protects you by clearing your lungs. You should avoid using cough suppressants if you can.  Your health care provider may have prescribed medicine if he or she thinks your pneumonia is caused by bacteria or influenza. Finish your medicine even if you start to feel better.  Your health care provider may also prescribe an expectorant. This loosens the mucus to be coughed up.  Take medicines only as directed by your health care provider.  Do not smoke. Smoking is a common cause of bronchitis and can contribute to pneumonia. If you are a smoker and continue to smoke, your cough may last several weeks after your  pneumonia has cleared.  A cold steam vaporizer or humidifier in your room or home may help loosen mucus.  Coughing is often worse at night. Sleeping in a semi-upright position in a recliner or using a couple pillows under your head will help with this.  Get rest as you feel it is needed. Your body will usually let you know when you need to rest. PREVENTION A pneumococcal shot (vaccine) is available to prevent a common bacterial cause of pneumonia. This is usually suggested for:  People over 65 years old.  Patients on chemotherapy.  People with chronic lung problems, such as bronchitis or emphysema.  People with immune system problems. If you are over 65 or have a high risk condition, you may receive the pneumococcal vaccine if you have not received it before. In some countries, a routine influenza vaccine is also recommended. This vaccine can help prevent some cases of pneumonia.You may be offered the influenza vaccine as part of your care. If you smoke, it is time to quit. You may receive instructions on how to stop smoking. Your health care provider can provide medicines and counseling to help you quit. SEEK MEDICAL CARE IF: You have a fever. SEEK IMMEDIATE MEDICAL CARE IF:   Your illness becomes worse. This is especially true if you are elderly or weakened from any other disease.  You cannot control your cough with suppressants and are losing sleep.  You begin coughing up blood.  You develop pain which is getting worse or is uncontrolled with medicines.  Any of the symptoms   which initially brought you in for treatment are getting worse rather than better. °· You develop shortness of breath or chest pain. °MAKE SURE YOU:  °· Understand these instructions. °· Will watch your condition. °· Will get help right away if you are not doing well or get worse. °Document Released: 03/22/2005 Document Revised: 08/06/2013 Document Reviewed: 06/11/2010 °ExitCare® Patient Information ©2015  [employer]. This information is not intended to replace advice given to you by your health care provider. Make sure you discuss any questions you have with your health care provider. ° ° °Chronic Obstructive Pulmonary Disease °Chronic obstructive pulmonary disease (COPD) is a common lung condition in which airflow from the lungs is limited. COPD is a general term that can be used to describe many different lung problems that limit airflow, including both chronic bronchitis and emphysema.  If you have COPD, your lung function will probably never return to normal, but there are measures you can take to improve lung function and make yourself feel better.  °CAUSES  °· Smoking (common).   °· Exposure to secondhand smoke.   °· Genetic problems. °· Chronic inflammatory lung diseases or recurrent infections. °SYMPTOMS  °· Shortness of breath, especially with physical activity.   °· Deep, persistent (chronic) cough with a large amount of thick mucus.   °· Wheezing.   °· Rapid breaths (tachypnea).   °· Gray or bluish discoloration (cyanosis) of the skin, especially in fingers, toes, or lips.   °· Fatigue.   °· Weight loss.   °· Frequent infections or episodes when breathing symptoms become much worse (exacerbations).   °· Chest tightness. °DIAGNOSIS  °Your health care provider will take a medical history and perform a physical examination to make the initial diagnosis.  Additional tests for COPD may include:  °· Lung (pulmonary) function tests. °· Chest X-ray. °· CT scan. °· Blood tests. °TREATMENT  °Treatment available to help you feel better when you have COPD includes:  °· Inhaler and nebulizer medicines. These help manage the symptoms of COPD and make your breathing more comfortable. °· Supplemental oxygen. Supplemental oxygen is only helpful if you have a low oxygen level in your blood.   °· Exercise and physical activity. These are beneficial for nearly all people with COPD. Some people may also benefit from a  pulmonary rehabilitation program. °HOME CARE INSTRUCTIONS  °· Take all medicines (inhaled or pills) as directed by your health care provider. °· Avoid over-the-counter medicines or cough syrups that dry up your airway (such as antihistamines) and slow down the elimination of secretions unless instructed otherwise by your health care provider.   °· If you are a smoker, the most important thing that you can do is stop smoking. Continuing to smoke will cause further lung damage and breathing trouble. Ask your health care provider for help with quitting smoking. He or she can direct you to community resources or hospitals that provide support. °· Avoid exposure to irritants such as smoke, chemicals, and fumes that aggravate your breathing. °· Use oxygen therapy and pulmonary rehabilitation if directed by your health care provider. If you require home oxygen therapy, ask your health care provider whether you should purchase a pulse oximeter to measure your oxygen level at home.   °· Avoid contact with individuals who have a contagious illness. °· Avoid extreme temperature and humidity changes. °· Eat healthy foods. Eating smaller, more frequent meals and resting before meals may help you maintain your strength. °· Stay active, but balance activity with periods of rest. Exercise and physical activity will help you maintain your ability to do   things you want to do. °· Preventing infection and hospitalization is very important when you have COPD. Make sure to receive all the vaccines your health care provider recommends, especially the pneumococcal and influenza vaccines. Ask your health care provider whether you need a pneumonia vaccine. °· Learn and use relaxation techniques to manage stress. °· Learn and use controlled breathing techniques as directed by your health care provider. Controlled breathing techniques include:   °¨ Pursed lip breathing. Start by breathing in (inhaling) through your nose for 1 second. Then,  purse your lips as if you were going to whistle and breathe out (exhale) through the pursed lips for 2 seconds.   °¨ Diaphragmatic breathing. Start by putting one hand on your abdomen just above your waist. Inhale slowly through your nose. The hand on your abdomen should move out. Then purse your lips and exhale slowly. You should be able to feel the hand on your abdomen moving in as you exhale.   °· Learn and use controlled coughing to clear mucus from your lungs. Controlled coughing is a series of short, progressive coughs. The steps of controlled coughing are:   °1. Lean your head slightly forward.   °2. Breathe in deeply using diaphragmatic breathing.   °3. Try to hold your breath for 3 seconds.   °4. Keep your mouth slightly open while coughing twice.   °5. Spit any mucus out into a tissue.   °6. Rest and repeat the steps once or twice as needed. °SEEK MEDICAL CARE IF:  °· You are coughing up more mucus than usual.   °· There is a change in the color or thickness of your mucus.   °· Your breathing is more labored than usual.   °· Your breathing is faster than usual.   °SEEK IMMEDIATE MEDICAL CARE IF:  °· You have shortness of breath while you are resting.   °· You have shortness of breath that prevents you from: °¨ Being able to talk.   °¨ Performing your usual physical activities.   °· You have chest pain lasting longer than 5 minutes.   °· Your skin color is more cyanotic than usual. °· You measure low oxygen saturations for longer than 5 minutes with a pulse oximeter. °MAKE SURE YOU:  °· Understand these instructions. °· Will watch your condition. °· Will get help right away if you are not doing well or get worse. °Document Released: 12/30/2004 Document Revised: 08/06/2013 Document Reviewed: 11/16/2012 °ExitCare® Patient Information ©2015 [employer]. This information is not intended to replace advice given to you by your health care provider. Make sure you discuss any questions you have with your health  care provider. ° °

## 2014-08-26 NOTE — Discharge Summary (Addendum)
Physician Discharge Summary  [name] [name] VFI:[id number] DOB: 12-08-1934 DOA: 08/23/2014  PCP: Leonard Downing, MD  Admit date: 08/23/2014 Discharge date: 08/26/2014  Time spent: Less than 30 minutes  Recommendations for Outpatient Follow-up:  1. Dr. Claris Gower, PCP in 5 days. Please follow-up on urine Legionella antigen, final blood culture results that were sent from the hospital. 2. Recommend repeating chest x-ray in 4-6 weeks to ensure resolution of pneumonia findings.  Discharge Diagnoses:  Principal Problem:   CAP (community acquired pneumonia) Active Problems:   Essential hypertension   Discharge Condition: Improved & Stable  Diet recommendation: Heart healthy diet.  Filed Weights   08/23/14 2143 08/25/14 0612  Weight: 55.8 kg (123 lb 0.3 oz) 54.8 kg (120 lb 13 oz)    History of present illness:  80 y.o. Female with h/o CAD s/p DES (2008), HTN, HLD, who presented to the ED with 1 week history of cough and SOB. She was seen by her PCP a couple of days ago, started on a Z-pack which she had not completed.Since then her symptoms have persisted and been worsening. Worsening SOB, cough, congestion, generalized weakness. Admitted for possible CAP.  Hospital Course:   RML community-acquired pneumonia - Failed outpatient PO azithromycin treatment (had not completed) - Treated empirically with IV Rocephin and azithromycin and has completed 3 days so far - We will need follow-up chest x-ray in 4-6 weeks to ensure resolution of pneumonia findings - Former smoker - Follow sputum culture results: Normal OP Flora - Blood culture 2: Negative to date - Clinically improved. Will switch to oral Vantin and azithromycin to complete total 7 days treatment. - Urine streptococcal antigen: Negative - Urine Legionella antigen: Pending - Chest x-ray appears unchanged. - HIV antibody screen: Nonreactive  COPD exacerbation - Patient gives 50-pack-year smoking history - Likely  precipitated by pneumonia - Treated briefly with IV Solu-Medrol with good effect - COPD exacerbation seems to have resolved. We will discharge on rapidly tapering oral prednisone, antibiotics and when necessary albuterol inhaler.  Acute hypoxic respiratory failure - Secondary to COPD exacerbation and pneumonia - Resolved  Essential hypertension - Controlled - Continue Felodipine  CAD - Asymptomatic - Continue aspirin  Consultants:  None  Procedures:  None   Discharge Exam:  Complaints: Denies complaints. Denies dyspnea, cough or chest pain. Ambulated comfortably with nursing.  Filed Vitals:   08/25/14 2050 08/26/14 0149 08/26/14 0525 08/26/14 0837  BP: 126/48  134/56   Pulse: 102  90   Temp: 98 F (36.7 C)  97.9 F (36.6 C)   TempSrc: Oral  Oral   Resp: 18  16   Height:      Weight:      SpO2: 96% 92% 95% 92%    General exam: Pleasant elderly female sitting up comfortably in bed without distress. Looks much improved compared to yesterday.  Respiratory system: Clear to auscultation.No increased work of breathing.  Cardiovascular system: S1 & S2 heard, RRR. No JVD, murmurs, gallops, clicks or pedal edema. Gastrointestinal system: Abdomen is nondistended, soft and nontender. Normal bowel sounds heard. Central nervous system: Alert and oriented. No focal neurological deficits. Extremities: Symmetric 5 x 5 power.  Discharge Instructions      Discharge Instructions    Call MD for:  difficulty breathing, headache or visual disturbances    Complete by:  As directed      Call MD for:  extreme fatigue    Complete by:  As directed      Call MD for:  persistant dizziness or light-headedness    Complete by:  As directed      Call MD for:  severe uncontrolled pain    Complete by:  As directed      Call MD for:  temperature >100.4    Complete by:  As directed      Diet - low sodium heart healthy    Complete by:  As directed      Increase activity slowly     Complete by:  As directed             Medication List    STOP taking these medications        lisinopril 10 MG tablet  Commonly known as:  PRINIVIL,ZESTRIL      TAKE these medications        albuterol 108 (90 BASE) MCG/ACT inhaler  Commonly known as:  PROVENTIL HFA;VENTOLIN HFA  Inhale 1-2 puffs into the lungs every 4 (four) hours as needed for wheezing or shortness of breath.     aspirin 81 MG tablet  Take 81 mg by mouth daily.     azithromycin 500 MG tablet  Commonly known as:  ZITHROMAX  Take 1 tablet (500 mg total) by mouth every evening.     cefpodoxime 200 MG tablet  Commonly known as:  VANTIN  Take 1 tablet (200 mg total) by mouth 2 (two) times daily.     felodipine 10 MG 24 hr tablet  Commonly known as:  PLENDIL  Take 10 mg by mouth daily.     nitroGLYCERIN 0.4 MG SL tablet  Commonly known as:  NITROSTAT  Place 1 tablet (0.4 mg total) under the tongue every 5 (five) minutes as needed for chest pain.     predniSONE 10 MG tablet  Commonly known as:  DELTASONE  Take 4 tabs daily 2 days, then 3 tabs daily 2 days, then 2 tabs daily 2 days, then 1 tab daily 2 days, then stop.     vitamin C 1000 MG tablet  Take 1,000 mg by mouth as needed (when not feeling well).       Follow-up Information    Follow up with Leonard Downing, MD. Schedule an appointment as soon as possible for a visit in 5 days.   Specialty:  Family Medicine   Contact information:   [place] [address] [phone]        The results of significant diagnostics from this hospitalization (including imaging, microbiology, ancillary and laboratory) are listed below for reference.    Significant Diagnostic Studies: Dg Chest 2 View  08/26/2014   CLINICAL DATA:  Follow-up pneumonia  EXAM: CHEST  2 VIEW  COMPARISON:  08/23/2014  FINDINGS: Cardiomediastinal silhouette is stable. Persistent streaky atelectasis or residual infiltrate in right middle lobe. Mild left  basilar atelectasis. Mild degenerative changes thoracic spine. There is no pulmonary edema.  IMPRESSION: Persistent streaky atelectasis or infiltrate in right middle lobe. Mild left basilar atelectasis. No pulmonary edema.   Electronically Signed   By: Lahoma Crocker M.D.   On: 08/26/2014 10:20   Dg Chest 2 View  08/23/2014   CLINICAL DATA:  Productive cough the last week. Upper chest pain radiates into the back. History of hypertension and smoking.  EXAM: CHEST  2 VIEW  COMPARISON:  08/10/2011 and 06/02/2005  FINDINGS: Heart size is normal. There is minimal streaky density within the right middle lobe consistent with atelectasis or developing infiltrate. Bronchitic changes are present. No pulmonary edema.  Note is made of pectus deformity.  IMPRESSION: Right middle lobe atelectasis or infiltrate.  Followup PA and lateral chest X-ray is recommended in 3-4 weeks following trial of antibiotic therapy to ensure resolution and exclude underlying malignancy.   Electronically Signed   By: Nolon Nations M.D.   On: 08/23/2014 18:27    Microbiology: Recent Results (from the past 240 hour(s))  Culture, blood (routine x 2) Call MD if unable to obtain prior to antibiotics being given     Status: None (Preliminary result)   Collection Time: 08/23/14  9:13 PM  Result Value Ref Range Status   Specimen Description BLOOD LFARM  Final   Special Requests BOTTLES DRAWN AEROBIC AND ANAEROBIC 4CC  Final   Culture   Final           BLOOD CULTURE RECEIVED NO GROWTH TO DATE CULTURE WILL BE HELD FOR 5 DAYS BEFORE ISSUING A FINAL NEGATIVE REPORT Performed at [facility]    Report Status PENDING  Incomplete  Culture, blood (routine x 2) Call MD if unable to obtain prior to antibiotics being given     Status: None (Preliminary result)   Collection Time: 08/23/14  9:20 PM  Result Value Ref Range Status   Specimen Description BLOOD LAC  Final   Special Requests BOTTLES DRAWN AEROBIC AND ANAEROBIC 4CC  Final   Culture    Final           BLOOD CULTURE RECEIVED NO GROWTH TO DATE CULTURE WILL BE HELD FOR 5 DAYS BEFORE ISSUING A FINAL NEGATIVE REPORT Performed at [facility]    Report Status PENDING  Incomplete  Culture, sputum-assessment     Status: None   Collection Time: 08/24/14 11:09 AM  Result Value Ref Range Status   Specimen Description SPUTUM  Final   Special Requests NONE  Final   Sputum evaluation   Final    THIS SPECIMEN IS ACCEPTABLE. RESPIRATORY CULTURE REPORT TO FOLLOW.   Report Status 08/24/2014 FINAL  Final  Culture, respiratory (NON-Expectorated)     Status: None (Preliminary result)   Collection Time: 08/24/14 11:09 AM  Result Value Ref Range Status   Specimen Description SPUTUM  Final   Special Requests NONE  Final   Gram Stain   Final    ABUNDANT WBC PRESENT,BOTH PMN AND MONONUCLEAR RARE SQUAMOUS EPITHELIAL CELLS PRESENT FEW GRAM POSITIVE COCCI IN PAIRS AND CHAINS FEW GRAM POSITIVE RODS Performed at [facility]    Culture   Final    NORMAL OROPHARYNGEAL FLORA Performed at [facility]    Report Status PENDING  Incomplete     Labs: Basic Metabolic Panel:  Recent Labs Lab 08/23/14 1504  NA 139  K 3.8  CL 101  CO2 26  GLUCOSE 137*  BUN 15  CREATININE 1.00  CALCIUM 9.4   Liver Function Tests: No results for input(s): AST, ALT, ALKPHOS, BILITOT, PROT, ALBUMIN in the last 168 hours. No results for input(s): LIPASE, AMYLASE in the last 168 hours. No results for input(s): AMMONIA in the last 168 hours. CBC:  Recent Labs Lab 08/23/14 1504  WBC 12.1*  NEUTROABS 8.0*  HGB 13.2  HCT 40.5  MCV 88.2  PLT 327   Cardiac Enzymes: No results for input(s): CKTOTAL, CKMB, CKMBINDEX, TROPONINI in the last 168 hours. BNP: BNP (last 3 results) No results for input(s): BNP in the last 8760 hours.  ProBNP (last 3 results) No results for input(s): PROBNP in the last 8760 hours.  CBG: No results for input(s): GLUCAP in the last 168  hours.    Signed:  Vernell Leep, MD, FACP, FHM. [facility] Pager [phone]  If 7PM-7AM, please contact night-coverage [email or web address] Password [facility] 08/26/2014, 12:34 PM

## 2014-08-26 NOTE — Progress Notes (Signed)
Pt discharged.  Husband accompanying pt.  Room air.  Respirations even and unlabored.  Denies pain.  No s/s of distress.  No complaints.  Leaving with prescriptions.

## 2014-08-26 NOTE — Care Management Note (Signed)
Case Management Note  Patient Details  Name: [name] [name] MRN: [id number] Date of Birth: 1934/09/20  Subjective/Objective:     80 yo female admitted with CAP from home with spouse               Action/Plan: Discharge planning. No orders, no needs.  Expected Discharge Date:  08/26/14               Expected Discharge Plan:  Home/Self Care  In-House Referral:     Discharge planning Services  CM Consult  Post Acute Care Choice:  NA Choice offered to:  NA  DME Arranged:    DME Agency:  NA  HH Arranged:  NA HH Agency:     Status of Service:     Medicare Important Message Given:  Yes Date Medicare IM Given:  08/26/14 Medicare IM give by:  Leanne Chang Date Additional Medicare IM Given:    Additional Medicare Important Message give by:     If discussed at [place] of Stay Meetings, dates discussed:    Additional Comments:  Scot Dock, RN 08/26/2014, 11:30 AM

## 2014-08-27 LAB — CULTURE, RESPIRATORY: CULTURE: NORMAL

## 2014-08-27 LAB — CULTURE, RESPIRATORY W GRAM STAIN

## 2014-08-30 LAB — CULTURE, BLOOD (ROUTINE X 2)
Culture: NO GROWTH
Culture: NO GROWTH

## 2014-11-06 ENCOUNTER — Ambulatory Visit
Admission: RE | Admit: 2014-11-06 | Discharge: 2014-11-06 | Disposition: A | Discharge status: Home / Self Care | Payer: Medicare Other | Source: Ambulatory Visit | Attending: Family Medicine | Admitting: Family Medicine

## 2014-11-06 ENCOUNTER — Other Ambulatory Visit: Payer: Self-pay | Admitting: Family Medicine

## 2014-11-06 DIAGNOSIS — J189 Pneumonia, unspecified organism: Secondary | ICD-10-CM

## 2014-12-12 ENCOUNTER — Telehealth: Payer: Self-pay | Admitting: Cardiovascular Disease

## 2014-12-12 NOTE — Telephone Encounter (Signed)
Clear to have foot surgery normal myovue 07/2014

## 2014-12-12 NOTE — Telephone Encounter (Signed)
MESSAGE SENT TO DR NISHAN FOR REVIEW ./CY 

## 2014-12-12 NOTE — Telephone Encounter (Signed)
New message     Request for surgical clearance:  1. What type of surgery is being performed? Excision of Cyst on right foot  2. When is this surgery scheduled? Not scheduled at this time  3. Are there any medications that need to be held prior to surgery and how long? Not sure   4. Name of physician performing surgery? Dr Wylene Simmer  5. What is your office phone and fax number? [facility] [phone]   Fax  [phone]  Clearance request was faxed on 9-1 @ 7:54am and 9-7 @ 10:32am

## 2014-12-17 NOTE — Telephone Encounter (Signed)
Printed and taken to MR to be faxed

## 2014-12-18 ENCOUNTER — Other Ambulatory Visit: Payer: Self-pay | Admitting: Orthopedic Surgery

## 2014-12-19 ENCOUNTER — Encounter (HOSPITAL_BASED_OUTPATIENT_CLINIC_OR_DEPARTMENT_OTHER): Payer: Self-pay | Admitting: *Deleted

## 2014-12-19 NOTE — Pre-Procedure Instructions (Signed)
Pt's Cardiac Clearance note is on chart. EKG in computer from 4/16. Myocardial perfusion study in computer from 4/16

## 2014-12-26 ENCOUNTER — Ambulatory Visit (HOSPITAL_BASED_OUTPATIENT_CLINIC_OR_DEPARTMENT_OTHER): Payer: Medicare Other | Admitting: Anesthesiology

## 2014-12-26 ENCOUNTER — Encounter (HOSPITAL_BASED_OUTPATIENT_CLINIC_OR_DEPARTMENT_OTHER): Payer: Self-pay | Admitting: *Deleted

## 2014-12-26 ENCOUNTER — Ambulatory Visit (HOSPITAL_BASED_OUTPATIENT_CLINIC_OR_DEPARTMENT_OTHER)
Admission: RE | Admit: 2014-12-26 | Discharge: 2014-12-26 | Disposition: A | Discharge status: Home / Self Care | Payer: Medicare Other | Source: Ambulatory Visit | Attending: Orthopedic Surgery | Admitting: Orthopedic Surgery

## 2014-12-26 ENCOUNTER — Encounter (HOSPITAL_BASED_OUTPATIENT_CLINIC_OR_DEPARTMENT_OTHER)
Admission: RE | Disposition: A | Discharge status: Home / Self Care | Payer: Self-pay | Source: Ambulatory Visit | Attending: Orthopedic Surgery

## 2014-12-26 DIAGNOSIS — I2510 Atherosclerotic heart disease of native coronary artery without angina pectoris: Secondary | ICD-10-CM | POA: Diagnosis not present

## 2014-12-26 DIAGNOSIS — I10 Essential (primary) hypertension: Secondary | ICD-10-CM | POA: Diagnosis not present

## 2014-12-26 DIAGNOSIS — Z87891 Personal history of nicotine dependence: Secondary | ICD-10-CM | POA: Diagnosis not present

## 2014-12-26 DIAGNOSIS — Z955 Presence of coronary angioplasty implant and graft: Secondary | ICD-10-CM | POA: Insufficient documentation

## 2014-12-26 DIAGNOSIS — M67471 Ganglion, right ankle and foot: Secondary | ICD-10-CM | POA: Insufficient documentation

## 2014-12-26 DIAGNOSIS — Z7982 Long term (current) use of aspirin: Secondary | ICD-10-CM | POA: Insufficient documentation

## 2014-12-26 HISTORY — DX: Unspecified osteoarthritis, unspecified site: M19.90

## 2014-12-26 HISTORY — PX: GANGLION CYST EXCISION: SHX1691

## 2014-12-26 HISTORY — DX: Pneumonia, unspecified organism: J18.9

## 2014-12-26 SURGERY — EXCISION, GANGLION CYST, FOOT
Anesthesia: General | Site: Foot | Laterality: Right

## 2014-12-26 MED ORDER — FENTANYL CITRATE (PF) 100 MCG/2ML IJ SOLN
50.0000 ug | INTRAMUSCULAR | Status: DC | PRN
  Administered 2014-12-26: 50 ug via INTRAVENOUS

## 2014-12-26 MED ORDER — OXYCODONE HCL 5 MG PO TABS
5.0000 mg | ORAL_TABLET | Freq: Once | ORAL | Status: AC | PRN
  Administered 2014-12-26: 5 mg via ORAL

## 2014-12-26 MED ORDER — ONDANSETRON HCL 4 MG/2ML IJ SOLN
INTRAMUSCULAR | Status: DC | PRN
  Administered 2014-12-26: 4 mg via INTRAVENOUS

## 2014-12-26 MED ORDER — GLYCOPYRROLATE 0.2 MG/ML IJ SOLN: 0.2000 mg | Freq: Once | INTRAMUSCULAR | Status: DC | PRN

## 2014-12-26 MED ORDER — FENTANYL CITRATE (PF) 100 MCG/2ML IJ SOLN
INTRAMUSCULAR | Status: AC
  Filled 2014-12-26: qty 4

## 2014-12-26 MED ORDER — ONDANSETRON HCL 4 MG/2ML IJ SOLN: 4.0000 mg | Freq: Once | INTRAMUSCULAR | Status: DC | PRN

## 2014-12-26 MED ORDER — SCOPOLAMINE 1 MG/3DAYS TD PT72
1.0000 | MEDICATED_PATCH | Freq: Once | TRANSDERMAL | Status: DC | PRN
Start: 2014-12-26 — End: 2014-12-26

## 2014-12-26 MED ORDER — ONDANSETRON HCL 4 MG/2ML IJ SOLN
INTRAMUSCULAR | Status: AC
  Filled 2014-12-26: qty 2

## 2014-12-26 MED ORDER — SODIUM CHLORIDE 0.9 % IV SOLN
INTRAVENOUS | Status: DC
Start: 2014-12-26 — End: 2014-12-26

## 2014-12-26 MED ORDER — PROPOFOL 10 MG/ML IV BOLUS
INTRAVENOUS | Status: AC
  Filled 2014-12-26: qty 20

## 2014-12-26 MED ORDER — CHLORHEXIDINE GLUCONATE 4 % EX LIQD: 60.0000 mL | Freq: Once | CUTANEOUS | Status: DC

## 2014-12-26 MED ORDER — PROPOFOL 10 MG/ML IV BOLUS
INTRAVENOUS | Status: DC | PRN
  Administered 2014-12-26: 80 mg via INTRAVENOUS

## 2014-12-26 MED ORDER — MIDAZOLAM HCL 2 MG/2ML IJ SOLN: 1.0000 mg | INTRAMUSCULAR | Status: DC | PRN

## 2014-12-26 MED ORDER — LACTATED RINGERS IV SOLN
INTRAVENOUS | Status: DC
  Administered 2014-12-26 (×2): via INTRAVENOUS

## 2014-12-26 MED ORDER — OXYCODONE HCL 5 MG PO TABS
ORAL_TABLET | ORAL | Status: AC
Start: 2014-12-26 — End: 2014-12-26
  Filled 2014-12-26: qty 1

## 2014-12-26 MED ORDER — DEXAMETHASONE SODIUM PHOSPHATE 10 MG/ML IJ SOLN
INTRAMUSCULAR | Status: AC
  Filled 2014-12-26: qty 1

## 2014-12-26 MED ORDER — KETOROLAC TROMETHAMINE 30 MG/ML IJ SOLN
INTRAMUSCULAR | Status: AC
  Filled 2014-12-26: qty 1

## 2014-12-26 MED ORDER — CEFAZOLIN SODIUM-DEXTROSE 2-3 GM-% IV SOLR
INTRAVENOUS | Status: AC
  Filled 2014-12-26: qty 50

## 2014-12-26 MED ORDER — BUPIVACAINE-EPINEPHRINE 0.5% -1:200000 IJ SOLN
INTRAMUSCULAR | Status: DC | PRN
  Administered 2014-12-26: 10 mL

## 2014-12-26 MED ORDER — LIDOCAINE HCL (CARDIAC) 20 MG/ML IV SOLN
INTRAVENOUS | Status: AC
  Filled 2014-12-26: qty 5

## 2014-12-26 MED ORDER — FENTANYL CITRATE (PF) 100 MCG/2ML IJ SOLN: 25.0000 ug | INTRAMUSCULAR | Status: DC | PRN

## 2014-12-26 MED ORDER — DEXAMETHASONE SODIUM PHOSPHATE 4 MG/ML IJ SOLN
INTRAMUSCULAR | Status: DC | PRN
  Administered 2014-12-26: 10 mg via INTRAVENOUS

## 2014-12-26 MED ORDER — CEFAZOLIN SODIUM-DEXTROSE 2-3 GM-% IV SOLR
2.0000 g | INTRAVENOUS | Status: AC
  Administered 2014-12-26: 2 g via INTRAVENOUS

## 2014-12-26 MED ORDER — LIDOCAINE HCL (CARDIAC) 20 MG/ML IV SOLN
INTRAVENOUS | Status: DC | PRN
  Administered 2014-12-26: 50 mg via INTRAVENOUS

## 2014-12-26 MED ORDER — KETOROLAC TROMETHAMINE 15 MG/ML IJ SOLN
INTRAMUSCULAR | Status: DC | PRN
  Administered 2014-12-26: 15 mg via INTRAVENOUS

## 2014-12-26 MED ORDER — OXYCODONE HCL 5 MG/5ML PO SOLN: 5.0000 mg | Freq: Once | ORAL | Status: AC | PRN

## 2014-12-26 SURGICAL SUPPLY — 67 items
BANDAGE ESMARK 6X9 LF (GAUZE/BANDAGES/DRESSINGS) ×1 IMPLANT
BLADE MINI RND TIP GREEN BEAV (BLADE) IMPLANT
BLADE SURG 15 STRL LF DISP TIS (BLADE) ×1 IMPLANT
BLADE SURG 15 STRL SS (BLADE) ×2
BNDG COHESIVE 4X5 TAN STRL (GAUZE/BANDAGES/DRESSINGS) ×3 IMPLANT
BNDG COHESIVE 6X5 TAN STRL LF (GAUZE/BANDAGES/DRESSINGS) IMPLANT
BNDG CONFORM 3 STRL LF (GAUZE/BANDAGES/DRESSINGS) IMPLANT
BNDG ESMARK 4X9 LF (GAUZE/BANDAGES/DRESSINGS) IMPLANT
BNDG ESMARK 6X9 LF (GAUZE/BANDAGES/DRESSINGS) ×3
CHLORAPREP W/TINT 26ML (MISCELLANEOUS) ×3 IMPLANT
CLOSURE WOUND 1/2 X4 (GAUZE/BANDAGES/DRESSINGS)
COVER BACK TABLE 60X90IN (DRAPES) ×3 IMPLANT
CUFF TOURNIQUET SINGLE 34IN LL (TOURNIQUET CUFF) IMPLANT
DRAPE EXTREMITY T 121X128X90 (DRAPE) ×3 IMPLANT
DRAPE OEC MINIVIEW 54X84 (DRAPES) IMPLANT
DRAPE SURG 17X23 STRL (DRAPES) IMPLANT
DRAPE U-SHAPE 47X51 STRL (DRAPES) ×3 IMPLANT
DRSG MEPITEL 4X7.2 (GAUZE/BANDAGES/DRESSINGS) ×3 IMPLANT
DRSG PAD ABDOMINAL 8X10 ST (GAUZE/BANDAGES/DRESSINGS) ×3 IMPLANT
DRSG TEGADERM 4X4.75 (GAUZE/BANDAGES/DRESSINGS) IMPLANT
ELECT REM PT RETURN 9FT ADLT (ELECTROSURGICAL) ×3
ELECTRODE REM PT RTRN 9FT ADLT (ELECTROSURGICAL) ×1 IMPLANT
GAUZE SPONGE 4X4 12PLY STRL (GAUZE/BANDAGES/DRESSINGS) ×6 IMPLANT
GAUZE SPONGE 4X4 16PLY XRAY LF (GAUZE/BANDAGES/DRESSINGS) IMPLANT
GLOVE BIO SURGEON STRL SZ8 (GLOVE) ×3 IMPLANT
GLOVE BIOGEL PI IND STRL 7.0 (GLOVE) ×1 IMPLANT
GLOVE BIOGEL PI IND STRL 8 (GLOVE) ×1 IMPLANT
GLOVE BIOGEL PI INDICATOR 7.0 (GLOVE) ×2
GLOVE BIOGEL PI INDICATOR 8 (GLOVE) ×2
GLOVE ECLIPSE 6.5 STRL STRAW (GLOVE) ×3 IMPLANT
GLOVE ECLIPSE 7.5 STRL STRAW (GLOVE) ×3 IMPLANT
GLOVE EXAM NITRILE MD LF STRL (GLOVE) ×3 IMPLANT
GOWN STRL REUS W/ TWL LRG LVL3 (GOWN DISPOSABLE) ×1 IMPLANT
GOWN STRL REUS W/ TWL XL LVL3 (GOWN DISPOSABLE) ×2 IMPLANT
GOWN STRL REUS W/TWL LRG LVL3 (GOWN DISPOSABLE) ×2
GOWN STRL REUS W/TWL XL LVL3 (GOWN DISPOSABLE) ×4
NEEDLE HYPO 22GX1.5 SAFETY (NEEDLE) ×3 IMPLANT
NEEDLE HYPO 25X1 1.5 SAFETY (NEEDLE) IMPLANT
NS IRRIG 1000ML POUR BTL (IV SOLUTION) ×3 IMPLANT
PACK BASIN DAY SURGERY FS (CUSTOM PROCEDURE TRAY) ×3 IMPLANT
PAD CAST 4YDX4 CTTN HI CHSV (CAST SUPPLIES) ×1 IMPLANT
PADDING CAST ABS 4INX4YD NS (CAST SUPPLIES)
PADDING CAST ABS COTTON 4X4 ST (CAST SUPPLIES) IMPLANT
PADDING CAST COTTON 4X4 STRL (CAST SUPPLIES) ×2
PADDING CAST COTTON 6X4 STRL (CAST SUPPLIES) IMPLANT
PENCIL BUTTON HOLSTER BLD 10FT (ELECTRODE) ×3 IMPLANT
SANITIZER HAND PURELL 535ML FO (MISCELLANEOUS) ×3 IMPLANT
SHEET MEDIUM DRAPE 40X70 STRL (DRAPES) ×3 IMPLANT
SLEEVE SCD COMPRESS KNEE MED (MISCELLANEOUS) IMPLANT
SPONGE LAP 18X18 X RAY DECT (DISPOSABLE) ×3 IMPLANT
STOCKINETTE 6  STRL (DRAPES) ×2
STOCKINETTE 6 STRL (DRAPES) ×1 IMPLANT
STRIP CLOSURE SKIN 1/2X4 (GAUZE/BANDAGES/DRESSINGS) IMPLANT
SUCTION FRAZIER TIP 10 FR DISP (SUCTIONS) IMPLANT
SUT ETHILON 3 0 PS 1 (SUTURE) IMPLANT
SUT ETHILON 4 0 PS 2 18 (SUTURE) IMPLANT
SUT MNCRL AB 3-0 PS2 18 (SUTURE) ×3 IMPLANT
SUT MNCRL AB 4-0 PS2 18 (SUTURE) IMPLANT
SUT VIC AB 0 SH 27 (SUTURE) IMPLANT
SUT VIC AB 2-0 PS2 27 (SUTURE) IMPLANT
SYR BULB 3OZ (MISCELLANEOUS) ×3 IMPLANT
SYR CONTROL 10ML LL (SYRINGE) ×3 IMPLANT
TOWEL OR 17X24 6PK STRL BLUE (TOWEL DISPOSABLE) ×3 IMPLANT
TUBE CONNECTING 20'X1/4 (TUBING)
TUBE CONNECTING 20X1/4 (TUBING) IMPLANT
UNDERPAD 30X30 (UNDERPADS AND DIAPERS) ×3 IMPLANT
YANKAUER SUCT BULB TIP NO VENT (SUCTIONS) IMPLANT

## 2014-12-26 NOTE — Anesthesia Preprocedure Evaluation (Addendum)
Anesthesia Evaluation  Patient identified by MRN, date of birth, ID band Patient awake    Reviewed: Allergy & Precautions, NPO status , Patient's Chart, lab work & pertinent test results  Airway Mallampati: I  TM Distance: >3 FB Neck ROM: Full    Dental  (+) Edentulous Upper, Edentulous Lower   Pulmonary former smoker,    breath sounds clear to auscultation       Cardiovascular hypertension, + CAD and + Cardiac Stents  Normal cardiovascular exam  07/2014 Normal myoview with no evidence of ischemia. NL EF   Neuro/Psych negative neurological ROS     GI/Hepatic negative GI ROS, Neg liver ROS,   Endo/Other  negative endocrine ROS  Renal/GU negative Renal ROS     Musculoskeletal  (+) Arthritis ,   Abdominal   Peds  Hematology negative hematology ROS (+)   Anesthesia Other Findings   Reproductive/Obstetrics                            Anesthesia Physical Anesthesia Plan  ASA: III  Anesthesia Plan: General   Post-op Pain Management:    Induction: Intravenous  Airway Management Planned: LMA  Additional Equipment:   Intra-op Plan:   Post-operative Plan:   Informed Consent: I have reviewed the patients History and Physical, chart, labs and discussed the procedure including the risks, benefits and alternatives for the proposed anesthesia with the patient or authorized representative who has indicated his/her understanding and acceptance.     Plan Discussed with: CRNA  Anesthesia Plan Comments:         Anesthesia Quick Evaluation

## 2014-12-26 NOTE — Anesthesia Procedure Notes (Signed)
Procedure Name: LMA Insertion Performed by: Terrance Mass Pre-anesthesia Checklist: Patient identified, Timeout performed, Emergency Drugs available, Suction available and Patient being monitored Patient Re-evaluated:Patient Re-evaluated prior to inductionOxygen Delivery Method: Circle System Utilized Preoxygenation: Pre-oxygenation with 100% oxygen Intubation Type: IV induction Ventilation: Mask ventilation without difficulty LMA: LMA inserted LMA Size: 4.0 Number of attempts: 1 Placement Confirmation: positive ETCO2 and breath sounds checked- equal and bilateral Tube secured with: Tape Dental Injury: Teeth and Oropharynx as per pre-operative assessment

## 2014-12-26 NOTE — Op Note (Signed)
[name] [name], [name] [name]                ACCOUNT NO.:  [id number]  MEDICAL RECORD NO.:  [id number]  LOCATION:                                 FACILITY:  PHYSICIAN:  Wylene Simmer, MD        DATE OF BIRTH:  [birth year]  DATE OF PROCEDURE:  12/26/2014 DATE OF DISCHARGE:                              OPERATIVE REPORT   PREOPERATIVE DIAGNOSIS:  Right foot ganglion cyst.  POSTOPERATIVE DIAGNOSIS:  Right foot ganglion cyst.  PROCEDURE:  Excision of right foot ganglion cyst.  SURGEON:  Wylene Simmer, MD  ANESTHESIA:  General.  ESTIMATED BLOOD LOSS:  Minimal.  TOURNIQUET TIME:  Approximately 5 minutes with an ankle Esmarch.  COMPLICATIONS:  None apparent.  DISPOSITION:  Extubated, awake, and stable to recovery.  INDICATIONS FOR PROCEDURE:  The patient is a 80 year old female with a painful mass at the dorsal right foot.  She says periodically it opens and drains clear gelatinous fluid.  She has failed nonoperative treatment to date.  She desires surgical excision.  She understands the risks and benefits, the alternatives of treatment options, and elects to proceed with surgical treatment.  She specifically understands risks of bleeding, infection, nerve damage, blood clots, need for additional surgery, continued pain, recurrence of the mass, amputation, and death.  PROCEDURE IN DETAIL:  After preoperative consent was obtained and the correct operative site was identified, the patient was brought to the operating room and placed supine on the operating table.  General anesthesia was induced.  Preoperative antibiotics were administered. Surgical time-out was taken.  Right lower extremity was prepped and draped in standard sterile fashion.  A longitudinal incision was then made over the mass with an ellipse around the mass due to the atrophy of the skin in this location.  Blunt dissection was then carried down through the subcutaneous tissues.  The mass was then dissected free from its  stalk.  The stalk was explored with the tips of the scissors and it appeared ago into the fourth and fifth TMT joint.  The wound was irrigated copiously.  Subcutaneous tissues were mobilized with scissors and blunt dissection.  The skin edges were then approximated with inverted simple sutures of 3-0 Monocryl, horizontal mattress sutures of 3-0 nylon were used to close the skin incision.  Sterile dressings were applied, followed by compression wrap.  Subcutaneous tissues had been infiltrated with 0.5% Marcaine with epinephrine after closure for postoperative pain control.  The tourniquet was released prior to closing the wound and hemostasis was achieved.  The patient was then awakened from anesthesia and transported to the recovery room in stable condition.  The mass was passed off the field as a specimen to Pathology.  FOLLOWUP PLAN:  The patient will be weightbearing as tolerated on her right foot in a flat postop shoe.  She will follow up with me in 2 weeks for suture removal and for pathology results.     Wylene Simmer, MD     JH/MEDQ  D:  12/26/2014  T:  12/26/2014  Job:  [id number]

## 2014-12-26 NOTE — H&P (Signed)
[name] [name] is an 80 y.o. female.   Chief Complaint: right foot pain  HPI: 80 y/o female wwith painful right foot ganglion cyst.  She presents now for excision.   Past Medical History  Diagnosis Date  . HTN (hypertension)   . Hypercholesterolemia   . CAD (coronary artery disease) 2008    s/p Taxus Element Perseus Study stent (DES) to OM1 in 08/2006; EF 65%  . Pneumonia 5/16    hospitalized for 4 days  . Anxiety     pt denies  . Arthritis     back, legs    Past Surgical History  Procedure Laterality Date  . Pci of hte lesion in th marginal branch circumflex artery    . Lower back surgery  1976  . Partial hysterectomy  1977  . Neck surgery  1990  . Appendectomy  1950  . Coronary angioplasty with stent placement      Family History  Problem Relation Age of Onset  . Heart failure Mother   . Hypertension Mother    Social History:  reports that she quit smoking about 3 years ago. Her smoking use included Cigarettes. She has a 40 pack-year smoking history. She has never used smokeless tobacco. She reports that she does not drink alcohol or use illicit drugs.  Allergies: No Known Allergies  Medications Prior to Admission  Medication Sig Dispense Refill  . albuterol (PROVENTIL HFA;VENTOLIN HFA) 108 (90 BASE) MCG/ACT inhaler Inhale 1-2 puffs into the lungs every 4 (four) hours as needed for wheezing or shortness of breath. 1 Inhaler 0  . Ascorbic Acid (VITAMIN C) 1000 MG tablet Take 1,000 mg by mouth as needed (when not feeling well).     Marland Kitchen aspirin 81 MG tablet Take 81 mg by mouth daily.      . felodipine (PLENDIL) 10 MG 24 hr tablet Take 10 mg by mouth daily.      . naproxen sodium (ANAPROX) 220 MG tablet Take 440 mg by mouth 2 (two) times daily with a meal.    . azithromycin (ZITHROMAX) 500 MG tablet Take 1 tablet (500 mg total) by mouth every evening. 4 tablet 0  . cefpodoxime (VANTIN) 200 MG tablet Take 1 tablet (200 mg total) by mouth 2 (two) times daily. 8 tablet 0  .  nitroGLYCERIN (NITROSTAT) 0.4 MG SL tablet Place 1 tablet (0.4 mg total) under the tongue every 5 (five) minutes as needed for chest pain. 25 tablet 3  . predniSONE (DELTASONE) 10 MG tablet Take 4 tabs daily 2 days, then 3 tabs daily 2 days, then 2 tabs daily 2 days, then 1 tab daily 2 days, then stop. 20 tablet 0    No results found for this or any previous visit (from the past 48 hour(s)). No results found.  ROS  No recent f/c/nv//wt loss  Blood pressure 159/50, pulse 66, temperature 97.9 F (36.6 C), temperature source Oral, resp. rate 20, height 5' (1.524 m), weight 55.792 kg (123 lb), SpO2 95 %. Physical Exam  73 wd elderly female in nad.  A and O x 4.  Mood and affect normal.  EOMI.  resp unlabored.  R foot with dorsal lateral subcutaneous mass.  NTTP.  No signs of infection.  Pulses palpable.  No lymphadenopathy.  5/5 strength in pF and DF of the ankle and toes.  Assessment/Plan R foot ganglion cyst.  To OR for excision of cyst.  The risks and benefits of the alternative treatment options have been discussed in detail.  The patient wishes to proceed with surgery and specifically understands risks of bleeding, infection, nerve damage, blood clots, need for additional surgery, amputation and death.   Wylene Simmer 01-18-2015, 3:11 PM

## 2014-12-26 NOTE — Discharge Instructions (Signed)
[name] Simmer, MD [place]  Please read the following information regarding your care after surgery.  Medications  You only need a prescription for the narcotic pain medicine (ex. oxycodone, Percocet, Norco).  All of the other medicines listed below are available over the counter. X acetominophen (Tylenol) 650 mg every 4-6 hours as you need for minor pain OR X naprosyn (Aleve) 220 mg daily as needed for pain   Weight Bearing ? Bear weight when you are able on your operated leg or foot. X Bear weight only on the heel of your operated foot in the post-op shoe. ? Do not bear any weight on the operated leg or foot.  Cast / Splint / Dressing X Keep your splint or cast clean and dry.  Dont put anything (coat hanger, pencil, etc) down inside of it.  If it gets damp, use a hair dryer on the cool setting to dry it.  If it gets soaked, call the office to schedule an appointment for a cast change. ? Remove your dressing 3 days after surgery and cover the incisions with dry dressings.    After your dressing, cast or splint is removed; you may shower, but do not soak or scrub the wound.  Allow the water to run over it, and then gently pat it dry.  Swelling It is normal for you to have swelling where you had surgery.  To reduce swelling and pain, keep your toes above your nose for at least 3 days after surgery.  It may be necessary to keep your foot or leg elevated for several weeks.  If it hurts, it should be elevated.  Follow Up Call my office at [phone] when you are discharged from the hospital or surgery center to schedule an appointment to be seen two weeks after surgery.  Call my office at [phone] if you develop a fever >101.5 F, nausea, vomiting, bleeding from the surgical site or severe pain.      Post Anesthesia Home Care Instructions  Activity: Get plenty of rest for the remainder of the day. A responsible adult should stay with you for 24 hours following the  procedure.  For the next 24 hours, DO NOT: -Drive a car -Paediatric nurse -Drink alcoholic beverages -Take any medication unless instructed by your physician -Make any legal decisions or sign important papers.  Meals: Start with liquid foods such as gelatin or soup. Progress to regular foods as tolerated. Avoid greasy, spicy, heavy foods. If nausea and/or vomiting occur, drink only clear liquids until the nausea and/or vomiting subsides. Call your physician if vomiting continues.  Special Instructions/Symptoms: Your throat may feel dry or sore from the anesthesia or the breathing tube placed in your throat during surgery. If this causes discomfort, gargle with warm salt water. The discomfort should disappear within 24 hours.  If you had a scopolamine patch placed behind your ear for the management of post- operative nausea and/or vomiting:  1. The medication in the patch is effective for 72 hours, after which it should be removed.  Wrap patch in a tissue and discard in the trash. Wash hands thoroughly with soap and water. 2. You may remove the patch earlier than 72 hours if you experience unpleasant side effects which may include dry mouth, dizziness or visual disturbances. 3. Avoid touching the patch. Wash your hands with soap and water after contact with the patch.

## 2014-12-26 NOTE — Anesthesia Postprocedure Evaluation (Signed)
  Anesthesia Post-op Note  Patient: [name] [name]  Procedure(s) Performed: Procedure(s) (LRB): EXCISON OF RIGHT FOOT GANGLION CYST (Right)  Patient Location: PACU  Anesthesia Type: General  Level of Consciousness: awake and alert   Airway and Oxygen Therapy: Patient Spontanous Breathing  Post-op Pain: mild  Post-op Assessment: Post-op Vital signs reviewed, Patient's Cardiovascular Status Stable, Respiratory Function Stable, Patent Airway and No signs of Nausea or vomiting  Last Vitals:  Filed Vitals:   12/26/14 1615  BP: 137/52  Pulse: 63  Temp:   Resp: 12    Post-op Vital Signs: stable   Complications: No apparent anesthesia complications

## 2014-12-26 NOTE — Transfer of Care (Signed)
Immediate Anesthesia Transfer of Care Note  Patient: [name] [name]  Procedure(s) Performed: Procedure(s): EXCISON OF RIGHT FOOT GANGLION CYST (Right)  Patient Location: PACU  Anesthesia Type:General  Level of Consciousness: sedated  Airway & Oxygen Therapy: Patient Spontanous Breathing and Patient connected to face mask oxygen  Post-op Assessment: Report given to RN and Post -op Vital signs reviewed and stable  Post vital signs: Reviewed and stable  Last Vitals:  Filed Vitals:   12/26/14 1340  BP: 159/50  Pulse: 66  Temp: 36.6 C  Resp: 20    Complications: No apparent anesthesia complications

## 2014-12-26 NOTE — Brief Op Note (Signed)
12/26/2014  4:05 PM  PATIENT:  [name] [name]  80 y.o. female  PRE-OPERATIVE DIAGNOSIS:  RIGHT FOOT GANGLION CYST  POST-OPERATIVE DIAGNOSIS:  RIGHT FOOT GANGLION CYST  Procedure(s): EXCISON OF RIGHT FOOT GANGLION CYST  SURGEON:  Wylene Simmer, MD  ASSISTANT: n/a  ANESTHESIA:   General  EBL:  minimal   TOURNIQUET:  approx 5 min with ankle esmarch  COMPLICATIONS:  None apparent  DISPOSITION:  Extubated, awake and stable to recovery.  DICTATION ID:  [id number]

## 2014-12-27 ENCOUNTER — Encounter (HOSPITAL_BASED_OUTPATIENT_CLINIC_OR_DEPARTMENT_OTHER): Payer: Self-pay | Admitting: Orthopedic Surgery

## 2014-12-27 NOTE — Addendum Note (Signed)
Addendum  created 12/27/14 0830 by Shontay Wallner W Jaqulyn Chancellor, CRNA   Modules edited: Anesthesia Events, Narrator   Narrator:  Narrator: Event Log Edited

## 2015-08-07 ENCOUNTER — Encounter: Payer: Self-pay | Admitting: Cardiovascular Disease

## 2015-08-07 ENCOUNTER — Ambulatory Visit (INDEPENDENT_AMBULATORY_CARE_PROVIDER_SITE_OTHER): Payer: Medicare Other | Admitting: Cardiovascular Disease

## 2015-08-07 VITALS — BP 120/74 | HR 76 | Ht 62.0 in | Wt 126.8 lb

## 2015-08-07 DIAGNOSIS — I6523 Occlusion and stenosis of bilateral carotid arteries: Secondary | ICD-10-CM

## 2015-08-07 NOTE — Patient Instructions (Addendum)

## 2015-08-07 NOTE — Progress Notes (Signed)
Patient ID: [name] [name], female   DOB: 1934/06/03, 81 y.o.   MRN: [id number]   [name] seen today.  She has had a previous stent to an OM branch back in 2008. She is doing well. She is not having angina. She is hesitant to take medications. She gets bruising with Plavix . Cholesterol elevated . She did mangage to stop smoking June2010    Myovue 08/23/11 normal with no ischemia and EF 72% reviewed Carotid 0/93    23-55% LICA stenosis   Seen April 2016 to clear for foot surgery and myovue was normal     ROS: Denies fever, malais, weight loss, blurry vision, decreased visual acuity, cough, sputum, SOB, hemoptysis, pleuritic pain, palpitaitons, heartburn, abdominal pain, melena, lower extremity edema, claudication, or rash.  All other systems reviewed and negative  General: Affect appropriate Healthy:  appears stated age 31: normal Neck supple with no adenopathy JVP normal left bruits no thyromegaly Lungs clear with no wheezing and good diaphragmatic motion Heart:  S1/S2 no murmur, no rub, gallop or click PMI normal Abdomen: benighn, BS positve, no tenderness, no AAA no bruit.  No HSM or HJR Distal pulses hard to palpate PT/DP bilaterally No edema Neuro non-focal Skin warm and dry No muscular weakness   Current Outpatient Prescriptions  Medication Sig Dispense Refill  . albuterol (PROVENTIL HFA;VENTOLIN HFA) 108 (90 BASE) MCG/ACT inhaler Inhale 1-2 puffs into the lungs every 4 (four) hours as needed for wheezing or shortness of breath. 1 Inhaler 0  . Ascorbic Acid (VITAMIN C) 1000 MG tablet Take 1,000 mg by mouth as needed (when not feeling well).     Marland Kitchen aspirin 81 MG tablet Take 81 mg by mouth daily.      . felodipine (PLENDIL) 10 MG 24 hr tablet Take 10 mg by mouth daily.      . naproxen sodium (ANAPROX) 220 MG tablet Take 440 mg by mouth 2 (two) times daily with a meal.    . nitroGLYCERIN (NITROSTAT) 0.4 MG SL tablet Place 1 tablet (0.4 mg total) under the tongue every 5 (five)  minutes as needed for chest pain. 25 tablet 3   No current facility-administered medications for this visit.    Allergies  Review of patient's allergies indicates no known allergies.  Electrocardiogram: May 2013 SR 80 PVC biatrial enlargement   07/10/14  SR rate 71  RAD  Nonspecific ST changes  08/07/15 NSR rate 62 RAD normal   Assessment and Plan CAD:  Distant history of OM stent  SSCP relieved with nitro  ECG not acute but ST changes  Myovue normal 07/2014  HTN:  Well controlled.  Continue current medications and low sodium Dash type diet.   Carotid:  Left bruit known  40- 73% LICA f/u duplex next year  ASA

## 2015-10-14 ENCOUNTER — Other Ambulatory Visit: Payer: Self-pay | Admitting: Family Medicine

## 2015-10-14 DIAGNOSIS — E2839 Other primary ovarian failure: Secondary | ICD-10-CM

## 2015-10-27 ENCOUNTER — Ambulatory Visit
Admission: RE | Admit: 2015-10-27 | Discharge: 2015-10-27 | Disposition: A | Discharge status: Home / Self Care | Payer: Medicare Other | Source: Ambulatory Visit | Attending: Family Medicine | Admitting: Family Medicine

## 2015-10-27 DIAGNOSIS — E2839 Other primary ovarian failure: Secondary | ICD-10-CM

## 2015-12-15 ENCOUNTER — Emergency Department (HOSPITAL_COMMUNITY)
Admission: EM | Admit: 2015-12-15 | Discharge: 2015-12-15 | Disposition: A | Discharge status: Home / Self Care | Payer: Medicare Other | Attending: Emergency Medicine | Admitting: Emergency Medicine

## 2015-12-15 ENCOUNTER — Encounter (HOSPITAL_COMMUNITY): Payer: Self-pay | Admitting: Emergency Medicine

## 2015-12-15 ENCOUNTER — Emergency Department (HOSPITAL_COMMUNITY): Payer: Medicare Other

## 2015-12-15 DIAGNOSIS — Z7982 Long term (current) use of aspirin: Secondary | ICD-10-CM | POA: Diagnosis not present

## 2015-12-15 DIAGNOSIS — Z79899 Other long term (current) drug therapy: Secondary | ICD-10-CM | POA: Insufficient documentation

## 2015-12-15 DIAGNOSIS — Y939 Activity, unspecified: Secondary | ICD-10-CM | POA: Diagnosis not present

## 2015-12-15 DIAGNOSIS — Z23 Encounter for immunization: Secondary | ICD-10-CM | POA: Insufficient documentation

## 2015-12-15 DIAGNOSIS — W010XXA Fall on same level from slipping, tripping and stumbling without subsequent striking against object, initial encounter: Secondary | ICD-10-CM | POA: Diagnosis not present

## 2015-12-15 DIAGNOSIS — Y9289 Other specified places as the place of occurrence of the external cause: Secondary | ICD-10-CM | POA: Insufficient documentation

## 2015-12-15 DIAGNOSIS — Z87891 Personal history of nicotine dependence: Secondary | ICD-10-CM | POA: Insufficient documentation

## 2015-12-15 DIAGNOSIS — Z79891 Long term (current) use of opiate analgesic: Secondary | ICD-10-CM | POA: Insufficient documentation

## 2015-12-15 DIAGNOSIS — I2510 Atherosclerotic heart disease of native coronary artery without angina pectoris: Secondary | ICD-10-CM | POA: Diagnosis not present

## 2015-12-15 DIAGNOSIS — Y999 Unspecified external cause status: Secondary | ICD-10-CM | POA: Diagnosis not present

## 2015-12-15 DIAGNOSIS — S39012A Strain of muscle, fascia and tendon of lower back, initial encounter: Secondary | ICD-10-CM | POA: Diagnosis not present

## 2015-12-15 DIAGNOSIS — I10 Essential (primary) hypertension: Secondary | ICD-10-CM | POA: Insufficient documentation

## 2015-12-15 DIAGNOSIS — S3992XA Unspecified injury of lower back, initial encounter: Secondary | ICD-10-CM | POA: Diagnosis present

## 2015-12-15 MED ORDER — DIAZEPAM 2 MG PO TABS: 2.0000 mg | ORAL_TABLET | Freq: Four times a day (QID) | ORAL | 0 refills | Status: DC | PRN

## 2015-12-15 MED ORDER — OXYCODONE-ACETAMINOPHEN 5-325 MG PO TABS
1.0000 | ORAL_TABLET | Freq: Once | ORAL | Status: AC
  Administered 2015-12-15: 1 via ORAL
  Filled 2015-12-15: qty 1

## 2015-12-15 MED ORDER — HYDROCODONE-ACETAMINOPHEN 5-325 MG PO TABS: 1.0000 | ORAL_TABLET | ORAL | 0 refills | Status: DC | PRN

## 2015-12-15 MED ORDER — TETANUS-DIPHTH-ACELL PERTUSSIS 5-2.5-18.5 LF-MCG/0.5 IM SUSP
0.5000 mL | Freq: Once | INTRAMUSCULAR | Status: AC
  Administered 2015-12-15: 0.5 mL via INTRAMUSCULAR
  Filled 2015-12-15: qty 0.5

## 2015-12-15 MED ORDER — TETANUS-DIPHTHERIA TOXOIDS TD 5-2 LFU IM INJ: 0.5000 mL | INJECTION | Freq: Once | INTRAMUSCULAR | Status: DC

## 2015-12-15 MED ORDER — DIAZEPAM 5 MG PO TABS
5.0000 mg | ORAL_TABLET | Freq: Once | ORAL | Status: AC
  Administered 2015-12-15: 5 mg via ORAL
  Filled 2015-12-15: qty 1

## 2015-12-15 NOTE — ED Provider Notes (Signed)
[facility] DEPT Provider Note   CSN: [id number] Arrival date & time: 12/15/15  1321     History   Chief Complaint Chief Complaint  Patient presents with  . Fall  . Back Pain  . Leg Injury    HPI [name] [name] is a 81 y.o. female.  81 y/o female here after falling 3 days ago Tripped off an object onto her left hip Also noted abrasion to left inner lower ext that bleed and first but now has stopped--has been losing local wound care No loc, denies head or neck pain No chest or abd discomfort Sharp pain at left lateral hip that's worse with movement--no radicular sx Used otc w/o relief      Past Medical History:  Diagnosis Date  . Anxiety    pt denies  . Arthritis    back, legs  . CAD (coronary artery disease) 2008   s/p Taxus Element Perseus Study stent (DES) to OM1 in 08/2006; EF 65%  . HTN (hypertension)   . Hypercholesterolemia   . Pneumonia 5/16   hospitalized for 4 days    Patient Active Problem List   Diagnosis Date Noted  . CAP (community acquired pneumonia) 08/23/2014  . Leg pain 09/15/2011  . Carotid bruit 09/15/2011  . Lung nodule 08/16/2011  . HYPERCHOLESTEROLEMIA 01/07/2010  . ANXIETY 01/07/2010  . Essential hypertension 01/07/2010  . CAD 01/07/2010    Past Surgical History:  Procedure Laterality Date  . APPENDECTOMY  1950  . CORONARY ANGIOPLASTY WITH STENT PLACEMENT    . GANGLION CYST EXCISION Right 12/26/2014   Procedure: EXCISON OF RIGHT FOOT GANGLION CYST;  Surgeon: Wylene Simmer, MD;  Location: [facility];  Service: Orthopedics;  Laterality: Right;  . lower back surgery  1976  . NECK SURGERY  1990  . PARTIAL HYSTERECTOMY  1977  . PCI of hte lesion in th marginal branch circumflex artery      OB History    No data available       Home Medications    Prior to Admission medications   Medication Sig Start Date End Date Taking? Authorizing Provider  albuterol (PROVENTIL HFA;VENTOLIN HFA) 108 (90 BASE) MCG/ACT  inhaler Inhale 1-2 puffs into the lungs every 4 (four) hours as needed for wheezing or shortness of breath. 08/26/14   Modena Jansky, MD  Ascorbic Acid (VITAMIN C) 1000 MG tablet Take 1,000 mg by mouth as needed (when not feeling well).     Historical Provider, MD  aspirin 81 MG tablet Take 81 mg by mouth daily.      Historical Provider, MD  felodipine (PLENDIL) 10 MG 24 hr tablet Take 10 mg by mouth daily.      Historical Provider, MD  naproxen sodium (ANAPROX) 220 MG tablet Take 440 mg by mouth 2 (two) times daily with a meal.    Historical Provider, MD  nitroGLYCERIN (NITROSTAT) 0.4 MG SL tablet Place 1 tablet (0.4 mg total) under the tongue every 5 (five) minutes as needed for chest pain. 07/10/14   Josue Hector, MD    Family History Family History  Problem Relation Age of Onset  . Heart failure Mother   . Hypertension Mother     Social History Social History  Substance Use Topics  . Smoking status: Former Smoker    Packs/day: 1.00    Years: 40.00    Types: Cigarettes    Quit date: 06/13/2011  . Smokeless tobacco: Never Used  . Alcohol use No  Allergies   Review of patient's allergies indicates no known allergies.   Review of Systems Review of Systems  All other systems reviewed and are negative.    Physical Exam Updated Vital Signs BP 160/80 (BP Location: Left Arm)   Pulse 74   Temp 98.3 F (36.8 C) (Oral)   Resp 18   SpO2 94%   Physical Exam  Constitutional: She is oriented to person, place, and time. She appears well-developed and well-nourished.  Non-toxic appearance. No distress.  HENT:  Head: Normocephalic and atraumatic.  Eyes: Conjunctivae, EOM and lids are normal. Pupils are equal, round, and reactive to light.  Neck: Normal range of motion. Neck supple. No tracheal deviation present. No thyroid mass present.  Cardiovascular: Normal rate, regular rhythm and normal heart sounds.  Exam reveals no gallop.   No murmur heard. Pulmonary/Chest: Effort  normal and breath sounds normal. No stridor. No respiratory distress. She has no decreased breath sounds. She has no wheezes. She has no rhonchi. She has no rales.  Abdominal: Soft. Normal appearance and bowel sounds are normal. She exhibits no distension. There is no tenderness. There is no rebound and no CVA tenderness.  Musculoskeletal: Normal range of motion. She exhibits no edema.       Left lower leg: She exhibits tenderness.       Legs: Neurological: She is alert and oriented to person, place, and time. She has normal strength. No cranial nerve deficit or sensory deficit. GCS eye subscore is 4. GCS verbal subscore is 5. GCS motor subscore is 6.  Skin: Skin is warm and dry. No abrasion and no rash noted.  Psychiatric: She has a normal mood and affect. Her speech is normal and behavior is normal.  Nursing note and vitals reviewed.    ED Treatments / Results  Labs (all labs ordered are listed, but only abnormal results are displayed) Labs Reviewed - No data to display  EKG  EKG Interpretation None       Radiology No results found.  Procedures Procedures (including critical care time)  Medications Ordered in ED Medications  oxyCODONE-acetaminophen (PERCOCET/ROXICET) 5-325 MG per tablet 1 tablet (not administered)  diazepam (VALIUM) tablet 5 mg (not administered)  tetanus & diphtheria toxoids (adult) (TENIVAC) injection 0.5 mL (not administered)     Initial Impression / Assessment and Plan / ED Course  I have reviewed the triage vital signs and the nursing notes.  Pertinent labs & imaging results that were available during my care of the patient were reviewed by me and considered in my medical decision making (see chart for details).  Clinical Course    Patient medicated here feels better. X-rays are without acute findings. Patient's tetanus status was updated stable for discharge  Final Clinical Impressions(s) / ED Diagnoses   Final diagnoses:  None    New  Prescriptions New Prescriptions   No medications on file     Lacretia Leigh, MD 12/15/15 1800
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
## 2016-05-01 ENCOUNTER — Emergency Department (HOSPITAL_COMMUNITY): Payer: Medicare Other

## 2016-05-01 ENCOUNTER — Emergency Department (HOSPITAL_COMMUNITY)
Admission: EM | Admit: 2016-05-01 | Discharge: 2016-05-01 | Disposition: A | Discharge status: Home / Self Care | Payer: Medicare Other | Attending: Emergency Medicine | Admitting: Emergency Medicine

## 2016-05-01 ENCOUNTER — Encounter (HOSPITAL_COMMUNITY): Payer: Self-pay | Admitting: *Deleted

## 2016-05-01 DIAGNOSIS — Y929 Unspecified place or not applicable: Secondary | ICD-10-CM | POA: Diagnosis not present

## 2016-05-01 DIAGNOSIS — Z87891 Personal history of nicotine dependence: Secondary | ICD-10-CM | POA: Diagnosis not present

## 2016-05-01 DIAGNOSIS — S0101XA Laceration without foreign body of scalp, initial encounter: Secondary | ICD-10-CM | POA: Insufficient documentation

## 2016-05-01 DIAGNOSIS — I10 Essential (primary) hypertension: Secondary | ICD-10-CM | POA: Insufficient documentation

## 2016-05-01 DIAGNOSIS — Z79899 Other long term (current) drug therapy: Secondary | ICD-10-CM | POA: Insufficient documentation

## 2016-05-01 DIAGNOSIS — Y939 Activity, unspecified: Secondary | ICD-10-CM | POA: Diagnosis not present

## 2016-05-01 DIAGNOSIS — Z7982 Long term (current) use of aspirin: Secondary | ICD-10-CM | POA: Diagnosis not present

## 2016-05-01 DIAGNOSIS — I2510 Atherosclerotic heart disease of native coronary artery without angina pectoris: Secondary | ICD-10-CM | POA: Insufficient documentation

## 2016-05-01 DIAGNOSIS — Y999 Unspecified external cause status: Secondary | ICD-10-CM | POA: Diagnosis not present

## 2016-05-01 DIAGNOSIS — S0990XA Unspecified injury of head, initial encounter: Secondary | ICD-10-CM

## 2016-05-01 DIAGNOSIS — Z955 Presence of coronary angioplasty implant and graft: Secondary | ICD-10-CM | POA: Insufficient documentation

## 2016-05-01 MED ORDER — HYDROCODONE-ACETAMINOPHEN 5-325 MG PO TABS
1.0000 | ORAL_TABLET | Freq: Once | ORAL | Status: AC
  Administered 2016-05-01: 1 via ORAL
  Filled 2016-05-01: qty 1
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
## 2016-05-01 NOTE — ED Notes (Signed)
Sheriff in room getting report from patient.
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
## 2016-05-01 NOTE — Discharge Instructions (Signed)
Clean area once a day with soap and water. Take Tylenol or Motrin for pain. Follow-up with your doctor in 7-10 days to have the sutures removed.

## 2016-05-01 NOTE — ED Notes (Signed)
Pt was wound was cleansed, with warm water.

## 2016-05-18 ENCOUNTER — Other Ambulatory Visit: Payer: Self-pay | Admitting: Hematology and Oncology

## 2017-05-08 ENCOUNTER — Encounter (HOSPITAL_COMMUNITY): Payer: Self-pay | Admitting: Emergency Medicine

## 2017-05-08 ENCOUNTER — Other Ambulatory Visit: Payer: Self-pay

## 2017-05-08 ENCOUNTER — Inpatient Hospital Stay (HOSPITAL_COMMUNITY)
Admission: EM | Admit: 2017-05-08 | Discharge: 2017-05-10 | DRG: 392 | Disposition: A | Discharge status: Home / Self Care | Payer: Medicare Other | Attending: Internal Medicine | Admitting: Internal Medicine

## 2017-05-08 ENCOUNTER — Emergency Department (HOSPITAL_COMMUNITY): Payer: Medicare Other

## 2017-05-08 DIAGNOSIS — Z90711 Acquired absence of uterus with remaining cervical stump: Secondary | ICD-10-CM

## 2017-05-08 DIAGNOSIS — R748 Abnormal levels of other serum enzymes: Secondary | ICD-10-CM | POA: Diagnosis present

## 2017-05-08 DIAGNOSIS — R112 Nausea with vomiting, unspecified: Secondary | ICD-10-CM | POA: Diagnosis not present

## 2017-05-08 DIAGNOSIS — K8020 Calculus of gallbladder without cholecystitis without obstruction: Secondary | ICD-10-CM | POA: Diagnosis present

## 2017-05-08 DIAGNOSIS — Z87891 Personal history of nicotine dependence: Secondary | ICD-10-CM

## 2017-05-08 DIAGNOSIS — D72829 Elevated white blood cell count, unspecified: Secondary | ICD-10-CM | POA: Diagnosis not present

## 2017-05-08 DIAGNOSIS — Z955 Presence of coronary angioplasty implant and graft: Secondary | ICD-10-CM

## 2017-05-08 DIAGNOSIS — I10 Essential (primary) hypertension: Secondary | ICD-10-CM | POA: Diagnosis present

## 2017-05-08 DIAGNOSIS — Z7982 Long term (current) use of aspirin: Secondary | ICD-10-CM

## 2017-05-08 DIAGNOSIS — N179 Acute kidney failure, unspecified: Secondary | ICD-10-CM | POA: Diagnosis present

## 2017-05-08 DIAGNOSIS — E860 Dehydration: Secondary | ICD-10-CM | POA: Diagnosis present

## 2017-05-08 DIAGNOSIS — A084 Viral intestinal infection, unspecified: Principal | ICD-10-CM | POA: Diagnosis present

## 2017-05-08 DIAGNOSIS — Z8249 Family history of ischemic heart disease and other diseases of the circulatory system: Secondary | ICD-10-CM

## 2017-05-08 DIAGNOSIS — I129 Hypertensive chronic kidney disease with stage 1 through stage 4 chronic kidney disease, or unspecified chronic kidney disease: Secondary | ICD-10-CM | POA: Diagnosis present

## 2017-05-08 DIAGNOSIS — R531 Weakness: Secondary | ICD-10-CM

## 2017-05-08 DIAGNOSIS — K760 Fatty (change of) liver, not elsewhere classified: Secondary | ICD-10-CM | POA: Diagnosis present

## 2017-05-08 DIAGNOSIS — N189 Chronic kidney disease, unspecified: Secondary | ICD-10-CM | POA: Diagnosis present

## 2017-05-08 DIAGNOSIS — I2510 Atherosclerotic heart disease of native coronary artery without angina pectoris: Secondary | ICD-10-CM | POA: Diagnosis present

## 2017-05-08 DIAGNOSIS — R110 Nausea: Secondary | ICD-10-CM

## 2017-05-08 LAB — COMPREHENSIVE METABOLIC PANEL
ALBUMIN: 3.3 g/dL — AB (ref 3.5–5.0)
ALK PHOS: 72 U/L (ref 38–126)
ALT: 31 U/L (ref 14–54)
AST: 80 U/L — AB (ref 15–41)
Anion gap: 11 (ref 5–15)
BUN: 21 mg/dL — AB (ref 6–20)
CALCIUM: 8.7 mg/dL — AB (ref 8.9–10.3)
CO2: 19 mmol/L — AB (ref 22–32)
CREATININE: 1.43 mg/dL — AB (ref 0.44–1.00)
Chloride: 106 mmol/L (ref 101–111)
GFR calc Af Amer: 39 mL/min — ABNORMAL LOW (ref >60)
GFR calc non Af Amer: 34 mL/min — ABNORMAL LOW (ref >60)
GLUCOSE: 152 mg/dL — AB (ref 65–99)
Potassium: 3.4 mmol/L — ABNORMAL LOW (ref 3.5–5.1)
Sodium: 136 mmol/L (ref 135–145)
Total Bilirubin: 0.6 mg/dL (ref 0.3–1.2)
Total Protein: 6.7 g/dL (ref 6.5–8.1)

## 2017-05-08 LAB — CBC WITH DIFFERENTIAL/PLATELET
BASOS PCT: 0 %
Basophils Absolute: 0 10*3/uL (ref 0.0–0.1)
EOS ABS: 0 10*3/uL (ref 0.0–0.7)
EOS PCT: 0 %
HCT: 40.3 % (ref 36.0–46.0)
Hemoglobin: 13.8 g/dL (ref 12.0–15.0)
Lymphocytes Relative: 4 %
Lymphs Abs: 1.2 10*3/uL (ref 0.7–4.0)
MCH: 29.4 pg (ref 26.0–34.0)
MCHC: 34.2 g/dL (ref 30.0–36.0)
MCV: 85.7 fL (ref 78.0–100.0)
MONO ABS: 3.7 10*3/uL — AB (ref 0.1–1.0)
Monocytes Relative: 12 %
NEUTROS ABS: 25.8 10*3/uL — AB (ref 1.7–7.7)
Neutrophils Relative %: 84 %
PLATELETS: 153 10*3/uL (ref 150–400)
RBC: 4.7 MIL/uL (ref 3.87–5.11)
RDW: 12.6 % (ref 11.5–15.5)
WBC: 30.7 10*3/uL — ABNORMAL HIGH (ref 4.0–10.5)

## 2017-05-08 LAB — URINALYSIS, ROUTINE W REFLEX MICROSCOPIC
BILIRUBIN URINE: NEGATIVE
Glucose, UA: NEGATIVE mg/dL
Ketones, ur: NEGATIVE mg/dL
Nitrite: NEGATIVE
PROTEIN: 100 mg/dL — AB
SPECIFIC GRAVITY, URINE: 1.01 (ref 1.005–1.030)
pH: 5 (ref 5.0–8.0)

## 2017-05-08 LAB — I-STAT CG4 LACTIC ACID, ED
LACTIC ACID, VENOUS: 2.71 mmol/L — AB (ref 0.5–1.9)
Lactic Acid, Venous: 1.68 mmol/L (ref 0.5–1.9)

## 2017-05-08 LAB — TROPONIN I: TROPONIN I: 0.58 ng/mL — AB (ref <0.03)

## 2017-05-08 LAB — INFLUENZA PANEL BY PCR (TYPE A & B)
INFLAPCR: NEGATIVE
Influenza B By PCR: NEGATIVE

## 2017-05-08 LAB — LIPASE, BLOOD: Lipase: 28 U/L (ref 11–51)

## 2017-05-08 MED ORDER — ASPIRIN EC 81 MG PO TBEC
81.0000 mg | DELAYED_RELEASE_TABLET | Freq: Every day | ORAL | Status: DC
  Administered 2017-05-09 – 2017-05-10 (×2): 81 mg via ORAL
  Filled 2017-05-08 (×2): qty 1

## 2017-05-08 MED ORDER — ONDANSETRON HCL 4 MG/2ML IJ SOLN: 4.0000 mg | Freq: Four times a day (QID) | INTRAMUSCULAR | Status: DC | PRN

## 2017-05-08 MED ORDER — ONDANSETRON HCL 4 MG/2ML IJ SOLN
4.0000 mg | Freq: Once | INTRAMUSCULAR | Status: AC
  Administered 2017-05-08: 4 mg via INTRAVENOUS
  Filled 2017-05-08: qty 2

## 2017-05-08 MED ORDER — NITROGLYCERIN 0.4 MG SL SUBL: 0.4000 mg | SUBLINGUAL_TABLET | SUBLINGUAL | Status: DC | PRN

## 2017-05-08 MED ORDER — ONDANSETRON HCL 4 MG PO TABS: 4.0000 mg | ORAL_TABLET | Freq: Four times a day (QID) | ORAL | Status: DC | PRN

## 2017-05-08 MED ORDER — FELODIPINE ER 10 MG PO TB24
10.0000 mg | ORAL_TABLET | Freq: Every day | ORAL | Status: DC
  Filled 2017-05-08: qty 1

## 2017-05-08 MED ORDER — ENSURE ENLIVE PO LIQD
237.0000 mL | Freq: Two times a day (BID) | ORAL | Status: DC
  Administered 2017-05-09 – 2017-05-10 (×3): 237 mL via ORAL

## 2017-05-08 MED ORDER — SODIUM CHLORIDE 0.9 % IV BOLUS (SEPSIS)
500.0000 mL | Freq: Once | INTRAVENOUS | Status: AC
  Administered 2017-05-08: 500 mL via INTRAVENOUS

## 2017-05-08 MED ORDER — VANCOMYCIN HCL IN DEXTROSE 1-5 GM/200ML-% IV SOLN
1000.0000 mg | Freq: Once | INTRAVENOUS | Status: AC
  Administered 2017-05-08: 1000 mg via INTRAVENOUS
  Filled 2017-05-08: qty 200

## 2017-05-08 MED ORDER — PIPERACILLIN-TAZOBACTAM 3.375 G IVPB 30 MIN
3.3750 g | Freq: Once | INTRAVENOUS | Status: AC
  Administered 2017-05-08: 3.375 g via INTRAVENOUS
  Filled 2017-05-08: qty 50

## 2017-05-08 MED ORDER — ACETAMINOPHEN 650 MG RE SUPP: 650.0000 mg | Freq: Four times a day (QID) | RECTAL | Status: DC | PRN

## 2017-05-08 MED ORDER — SODIUM CHLORIDE 0.9 % IV SOLN
INTRAVENOUS | Status: AC
  Administered 2017-05-08 – 2017-05-09 (×2): via INTRAVENOUS

## 2017-05-08 MED ORDER — SODIUM CHLORIDE 0.9 % IV SOLN
Freq: Once | INTRAVENOUS | Status: AC
  Administered 2017-05-08: 20:00:00 via INTRAVENOUS

## 2017-05-08 MED ORDER — ENOXAPARIN SODIUM 30 MG/0.3ML ~~LOC~~ SOLN
30.0000 mg | Freq: Every day | SUBCUTANEOUS | Status: DC
  Administered 2017-05-08 – 2017-05-09 (×2): 30 mg via SUBCUTANEOUS
  Filled 2017-05-08 (×2): qty 0.3

## 2017-05-08 MED ORDER — ACETAMINOPHEN 325 MG PO TABS: 650.0000 mg | ORAL_TABLET | Freq: Four times a day (QID) | ORAL | Status: DC | PRN

## 2017-05-08 NOTE — ED Notes (Signed)
Attempted to call report x1. Nurse unavailable at this time.

## 2017-05-08 NOTE — H&P (Addendum)
History and Physical    [name] [name] MBW:[id number] DOB: Jun 02, 1934 DOA: 05/08/2017  PCP: Leonard Downing, MD  Patient coming from: Home.  Chief Complaint: Nausea vomiting.  HPI: [name] [name] is a 83 y.o. female with history of CAD status post stenting, hypertension presents to the ER with complaints of feeling weak poor appetite and had nausea vomiting and diarrhea.  3 days ago patient had multiple episodes of nausea vomiting followed by at least 2 episodes of diarrhea.  Has not been feeling well has not taken her medications and has had poor appetite.  Denies any abdominal pain chest pain or shortness of breath.  Feeling generally unwell.  ED Course: The ER labs revealed markedly elevated WBC count patient was afebrile.  Blood cultures obtained since packet also was elevated patient was started on empiric antibiotics.  UA still pending.  On exam patient's abdomen appears benign.  Patient denies any productive cough or chest pain.  As per the husband patient was diagnosed with gallbladder stones 6 months ago and at the time she did not have any abdominal pain.  Review of Systems: As per HPI, rest all negative.   Past Medical History:  Diagnosis Date  . Anxiety    pt denies  . Arthritis    back, legs  . CAD (coronary artery disease) 2008   s/p Taxus Element Perseus Study stent (DES) to OM1 in 08/2006; EF 65%  . HTN (hypertension)   . Hypercholesterolemia   . Pneumonia 5/16   hospitalized for 4 days    Past Surgical History:  Procedure Laterality Date  . APPENDECTOMY  1950  . CORONARY ANGIOPLASTY WITH STENT PLACEMENT    . GANGLION CYST EXCISION Right 12/26/2014   Procedure: EXCISON OF RIGHT FOOT GANGLION CYST;  Surgeon: Wylene Simmer, MD;  Location: [facility];  Service: Orthopedics;  Laterality: Right;  . lower back surgery  1976  . NECK SURGERY  1990  . PARTIAL HYSTERECTOMY  1977  . PCI of hte lesion in th marginal branch circumflex artery       reports  that she quit smoking about 5 years ago. Her smoking use included cigarettes. She has a 40.00 pack-year smoking history. she has never used smokeless tobacco. She reports that she does not drink alcohol or use drugs.  No Known Allergies  Family History  Problem Relation Age of Onset  . Heart failure Mother   . Hypertension Mother     Prior to Admission medications   Medication Sig Start Date End Date Taking? Authorizing Provider  aspirin 81 MG tablet Take 81 mg by mouth daily.     Yes [provider]  cetirizine (ZYRTEC) 10 MG tablet Take 10 mg by mouth daily.   Yes [provider]  felodipine (PLENDIL) 10 MG 24 hr tablet Take 10 mg by mouth daily.     Yes [provider]  nitroGLYCERIN (NITROSTAT) 0.4 MG SL tablet Place 1 tablet (0.4 mg total) under the tongue every 5 (five) minutes as needed for chest pain. 07/10/14   Josue Hector, MD    Physical Exam: Vitals:   05/08/17 1900 05/08/17 2000 05/08/17 2030 05/08/17 2100  BP: (!) 114/48 (!) 107/55 (!) 113/53 (!) 132/56  Pulse: 82 83 84 79  Resp: 20 (!) 22 (!) 21 19  Temp:      TempSrc:      SpO2: 93% 91% 93% 94%      Constitutional: Moderately built and nourished. Vitals:  05/08/17 1900 05/08/17 2000 05/08/17 2030 05/08/17 2100  BP: (!) 114/48 (!) 107/55 (!) 113/53 (!) 132/56  Pulse: 82 83 84 79  Resp: 20 (!) 22 (!) 21 19  Temp:      TempSrc:      SpO2: 93% 91% 93% 94%   Eyes: Anicteric no pallor. ENMT: No discharge from the ears eyes nose or mouth. Neck: No JVD no mass felt. Respiratory: No rhonchi or crepitations. Cardiovascular: S1-S2 heard no murmurs appreciated. Abdomen: Soft nontender bowel sounds present. Musculoskeletal: No edema.  No joint effusion. Skin: No rash or skin appears warm. Neurologic: Alert awake oriented to time place and person.  Moves all extremities. Psychiatric: Appears normal.  Normal affect.   Labs on Admission: I have personally reviewed following labs and  imaging studies  CBC: Recent Labs  Lab 05/08/17 1750  WBC 30.7*  NEUTROABS 25.8*  HGB 13.8  HCT 40.3  MCV 85.7  PLT 465   Basic Metabolic Panel: Recent Labs  Lab 05/08/17 1750  NA 136  K 3.4*  CL 106  CO2 19*  GLUCOSE 152*  BUN 21*  CREATININE 1.43*  CALCIUM 8.7*   GFR: CrCl cannot be calculated (Unknown ideal weight.). Liver Function Tests: Recent Labs  Lab 05/08/17 1750  AST 80*  ALT 31  ALKPHOS 72  BILITOT 0.6  PROT 6.7  ALBUMIN 3.3*   Recent Labs  Lab 05/08/17 1750  LIPASE 28   No results for input(s): AMMONIA in the last 168 hours. Coagulation Profile: No results for input(s): INR, PROTIME in the last 168 hours. Cardiac Enzymes: No results for input(s): CKTOTAL, CKMB, CKMBINDEX, TROPONINI in the last 168 hours. BNP (last 3 results) No results for input(s): PROBNP in the last 8760 hours. HbA1C: No results for input(s): HGBA1C in the last 72 hours. CBG: No results for input(s): GLUCAP in the last 168 hours. Lipid Profile: No results for input(s): CHOL, HDL, LDLCALC, TRIG, CHOLHDL, LDLDIRECT in the last 72 hours. Thyroid Function Tests: No results for input(s): TSH, T4TOTAL, FREET4, T3FREE, THYROIDAB in the last 72 hours. Anemia Panel: No results for input(s): VITAMINB12, FOLATE, FERRITIN, TIBC, IRON, RETICCTPCT in the last 72 hours. Urine analysis:    Component Value Date/Time   COLORURINE YELLOW 08/23/2014 1502   APPEARANCEUR CLEAR 08/23/2014 1502   LABSPEC 1.005 08/23/2014 1502   PHURINE 6.0 08/23/2014 1502   GLUCOSEU NEGATIVE 08/23/2014 1502   HGBUR SMALL (A) 08/23/2014 1502   BILIRUBINUR NEGATIVE 08/23/2014 1502   KETONESUR NEGATIVE 08/23/2014 1502   PROTEINUR NEGATIVE 08/23/2014 1502   UROBILINOGEN 0.2 08/23/2014 1502   NITRITE NEGATIVE 08/23/2014 1502   LEUKOCYTESUR SMALL (A) 08/23/2014 1502   Sepsis Labs: @LABRCNTIP (procalcitonin:4,lacticidven:4) )No results found for this or any previous visit (from the past 240 hour(s)).    Radiological Exams on Admission: Dg Chest Port 1 View  Result Date: 05/08/2017 CLINICAL DATA:  Fever with nausea and vomiting. Nonproductive cough. EXAM: PORTABLE CHEST 1 VIEW COMPARISON:  11/06/2014. FINDINGS: The heart size is increased, but mediastinal contours are within normal limits. Both lungs are clear. The visualized skeletal structures are unremarkable. Thoracic atherosclerosis. IMPRESSION: No active disease.  Similar appearance to priors. Electronically Signed   By: Staci Righter M.D.   On: 05/08/2017 18:40    EKG: Independently reviewed.  Normal sinus rhythm with concerning changes in inferior leads.  Assessment/Plan Principal Problem:   Dehydration Active Problems:   Essential hypertension   Nausea & vomiting    1. SIRS -UA still pending.  Patient  is started on empiric antibiotics for now.  Since patient has nausea vomiting and previous history of gallbladder stones will check sonogram of the abdomen.  Continue with hydration. 2. Abnormal EKG with history of CAD -we will recheck EKG again and cycle cardiac markers check 2D echo.  Patient is on aspirin. 3. History of hypertension has not been taking her antihypertensive last 2 days because of poor appetite and feeling weak.  We will hold antihypertensives and closely follow blood pressure trends and keep patient on PRN IV hydralazine. 4. Acute on chronic renal failure likely from nausea vomiting and dehydration.  Recheck metabolic panel after hydration.  UA still pending.   DVT prophylaxis: Lovenox. Code Status: Full code. Family Communication: Patient's husband. Disposition Plan: Home. Consults called: None. Admission status: Observation.   Rise Patience MD [facility] Pager [phone].  If 7PM-7AM, please contact night-coverage [email or web address] Password [facility]  05/08/2017, 9:28 PM

## 2017-05-08 NOTE — ED Notes (Signed)
ED Provider at bedside. 

## 2017-05-08 NOTE — ED Triage Notes (Signed)
Pt comes from home. Pt brought in via [facility]. Pt stated symptoms started Friday and have gradually worsened. Symptoms include Nausea, Vomiting and Diarrhea. Pt stated she did have a fever. Pt does have non-productive cough. Pt has only had Boost for breakfast. Pt has not had solid meal since Thursday night. No bowel or urine changes. Pt is ambulatory with assistance and AOx4.

## 2017-05-08 NOTE — ED Provider Notes (Addendum)
[facility] DEPT Provider Note   CSN: [id number] Arrival date & time: 05/08/17  1704     History   Chief Complaint Chief Complaint  Patient presents with  . Weakness    HPI [name] [name] is a 83 y.o. female.  83 year old female with prior history of arthritis, CAD presents from home with reported decreased p.o. intake.  Husband provides the majority of history.  He reports that the patient has not had significant p.o. intake since 48 hours prior to arrival.  Patient apparently had several rounds of nausea and vomiting on Friday.  She has had 1-2 loose bowel movements on Friday as well.  Since that time the patient reportedly has not taken good p.o.  No further diarrhea was noticed by the husband.  The patient appears to be fairly comfortable upon my evaluation.  She denies abdominal pain.  She denies shortness of breath or chest pain.  The husband does not report a fever at home.   The history is provided by the patient and a relative.  Weakness  This is a new problem. The current episode started more than 2 days ago. The problem has not changed since onset.There was no focality noted. There has been no fever. Pertinent negatives include no shortness of breath, no chest pain and no vomiting. Associated medical issues do not include trauma or seizures.    Past Medical History:  Diagnosis Date  . Anxiety    pt denies  . Arthritis    back, legs  . CAD (coronary artery disease) 2008   s/p Taxus Element Perseus Study stent (DES) to OM1 in 08/2006; EF 65%  . HTN (hypertension)   . Hypercholesterolemia   . Pneumonia 5/16   hospitalized for 4 days    Patient Active Problem List   Diagnosis Date Noted  . CAP (community acquired pneumonia) 08/23/2014  . Leg pain 09/15/2011  . Carotid bruit 09/15/2011  . Lung nodule 08/16/2011  . HYPERCHOLESTEROLEMIA 01/07/2010  . ANXIETY 01/07/2010  . Essential hypertension 01/07/2010  . CAD 01/07/2010    Past  Surgical History:  Procedure Laterality Date  . APPENDECTOMY  1950  . CORONARY ANGIOPLASTY WITH STENT PLACEMENT    . GANGLION CYST EXCISION Right 12/26/2014   Procedure: EXCISON OF RIGHT FOOT GANGLION CYST;  Surgeon: Wylene Simmer, MD;  Location: [facility];  Service: Orthopedics;  Laterality: Right;  . lower back surgery  1976  . NECK SURGERY  1990  . PARTIAL HYSTERECTOMY  1977  . PCI of hte lesion in th marginal branch circumflex artery      OB History    No data available       Home Medications    Prior to Admission medications   Medication Sig Start Date End Date Taking? Authorizing Provider  aspirin 81 MG tablet Take 81 mg by mouth daily.     Yes [provider]  cetirizine (ZYRTEC) 10 MG tablet Take 10 mg by mouth daily.   Yes [provider]  felodipine (PLENDIL) 10 MG 24 hr tablet Take 10 mg by mouth daily.     Yes [provider]  nitroGLYCERIN (NITROSTAT) 0.4 MG SL tablet Place 1 tablet (0.4 mg total) under the tongue every 5 (five) minutes as needed for chest pain. 07/10/14   Josue Hector, MD    Family History Family History  Problem Relation Age of Onset  . Heart failure Mother   . Hypertension Mother     Social  History Social History   Tobacco Use  . Smoking status: Former Smoker    Packs/day: 1.00    Years: 40.00    Pack years: 40.00    Types: Cigarettes    Last attempt to quit: 06/13/2011    Years since quitting: 5.9  . Smokeless tobacco: Never Used  Substance Use Topics  . Alcohol use: No  . Drug use: No     Allergies   Patient has no known allergies.   Review of Systems Review of Systems  Respiratory: Negative for shortness of breath.   Cardiovascular: Negative for chest pain.  Gastrointestinal: Negative for vomiting.  Neurological: Positive for weakness.  All other systems reviewed and are negative.    Physical Exam Updated Vital Signs BP (!) 114/48   Pulse 82   Temp 99.6 F (37.6 C)  (Rectal)   Resp 20   SpO2 93%   Physical Exam  Constitutional: She is oriented to person, place, and time. She appears well-developed and well-nourished. No distress.  HENT:  Head: Normocephalic and atraumatic.  Mouth/Throat: Oropharynx is clear and moist.  Eyes: Conjunctivae and EOM are normal. Pupils are equal, round, and reactive to light.  Neck: Normal range of motion. Neck supple.  Cardiovascular: Normal rate, regular rhythm and normal heart sounds.  Pulmonary/Chest: Effort normal and breath sounds normal. No respiratory distress.  Abdominal: Soft. She exhibits no distension. There is no tenderness.  Musculoskeletal: Normal range of motion. She exhibits no edema or deformity.  Neurological: She is alert and oriented to person, place, and time.  Skin: Skin is warm and dry.  Psychiatric: She has a normal mood and affect.  Nursing note and vitals reviewed.    ED Treatments / Results  Labs (all labs ordered are listed, but only abnormal results are displayed) Labs Reviewed  COMPREHENSIVE METABOLIC PANEL - Abnormal; Notable for the following components:      Result Value   Potassium 3.4 (*)    CO2 19 (*)    Glucose, Bld 152 (*)    BUN 21 (*)    Creatinine, Ser 1.43 (*)    Calcium 8.7 (*)    Albumin 3.3 (*)    AST 80 (*)    GFR calc non Af Amer 34 (*)    GFR calc Af Amer 39 (*)    All other components within normal limits  CBC WITH DIFFERENTIAL/PLATELET - Abnormal; Notable for the following components:   WBC 30.7 (*)    Neutro Abs 25.8 (*)    Monocytes Absolute 3.7 (*)    All other components within normal limits  I-STAT CG4 LACTIC ACID, ED - Abnormal; Notable for the following components:   Lactic Acid, Venous 2.71 (*)    All other components within normal limits  CULTURE, BLOOD (ROUTINE X 2)  CULTURE, BLOOD (ROUTINE X 2)  LIPASE, BLOOD  URINALYSIS, ROUTINE W REFLEX MICROSCOPIC  I-STAT CG4 LACTIC ACID, ED    EKG  EKG Interpretation  Date/Time:  Sunday May 08 2017 17:40:13 EST Ventricular Rate:  109 PR Interval:    QRS Duration: 101 QT Interval:  342 QTC Calculation: 459 R Axis:   114 Text Interpretation:  Sinus tachycardia Inferior infarct, old Confirmed by Dene Gentry [id number]) on 05/08/2017 5:43:08 PM       Radiology Dg Chest Port 1 View  Result Date: 05/08/2017 CLINICAL DATA:  Fever with nausea and vomiting. Nonproductive cough. EXAM: PORTABLE CHEST 1 VIEW COMPARISON:  11/06/2014. FINDINGS: The heart size is increased, but  mediastinal contours are within normal limits. Both lungs are clear. The visualized skeletal structures are unremarkable. Thoracic atherosclerosis. IMPRESSION: No active disease.  Similar appearance to priors. Electronically Signed   By: Staci Righter M.D.   On: 05/08/2017 18:40    Procedures Procedures (including critical care time)  Medications Ordered in ED Medications  piperacillin-tazobactam (ZOSYN) IVPB 3.375 g (not administered)  vancomycin (VANCOCIN) IVPB 1000 mg/200 mL premix (not administered)  sodium chloride 0.9 % bolus 500 mL (0 mLs Intravenous Stopped 05/08/17 2008)  ondansetron (ZOFRAN) injection 4 mg (4 mg Intravenous Given 05/08/17 1807)  sodium chloride 0.9 % bolus 500 mL (0 mLs Intravenous Stopped 05/08/17 2008)  0.9 %  sodium chloride infusion ( Intravenous New Bag/Given 05/08/17 2023)     Initial Impression / Assessment and Plan / ED Course  I have reviewed the triage vital signs and the nursing notes.  Pertinent labs & imaging results that were available during my care of the patient were reviewed by me and considered in my medical decision making (see chart for details).     MDM  Screen Complete  Patient is presenting with decreased p.o. intake over the last 48-72 hours.  Screening examination reveals elevated WBC count.  Electrolytes suggest mild to moderate dehydration.  CXR does not reveal infiltrates. UA is pending at time of admission. Broad spectrum antibiotics given for possible  serious bacterial infection.  Patient will be admitted to the hospitalist service for further evaluation and treatment.  Final Clinical Impressions(s) / ED Diagnoses   Final diagnoses:  Weakness  Dehydration  Leukocytosis, unspecified type    ED Discharge Orders    None       Valarie Merino, MD 05/08/17 2051    Valarie Merino, MD 05/08/17 2052

## 2017-05-08 NOTE — ED Notes (Signed)
ED TO INPATIENT HANDOFF REPORT  Name/Age/Gender [name] [name] 83 y.o. female  Code Status    Code Status Orders  (From admission, onward)        Start     Ordered   05/08/17 2125  Full code  Continuous     05/08/17 2126    Code Status History    Date Active Date Inactive Code Status Order ID Comments User Context   08/23/2014 20:24 08/26/2014 17:44 Full Code [id number]  Etta Quill, DO ED      Home/SNF/Other Home  Chief Complaint general weakness  Level of Care/Admitting Diagnosis ED Disposition    ED Disposition Condition [place]: [name] [100102]  Level of Care: Telemetry [5]  Admit to tele based on following criteria: Monitor for Ischemic changes  Diagnosis: Dehydration [276.51.ICD-9-CM]  Admitting Physician: Rise Patience [phone]  Attending Physician: Rise Patience [phone]  PT Class (Do Not Modify): Observation [104]  PT Acc Code (Do Not Modify): Observation [10022]       Medical History Past Medical History:  Diagnosis Date  . Anxiety    pt denies  . Arthritis    back, legs  . CAD (coronary artery disease) 2008   s/p Taxus Element Perseus Study stent (DES) to OM1 in 08/2006; EF 65%  . HTN (hypertension)   . Hypercholesterolemia   . Pneumonia 5/16   hospitalized for 4 days    Allergies No Known Allergies  IV Location/Drains/Wounds Patient Lines/Drains/Airways Status   Active Line/Drains/Airways    Name:   Placement date:   Placement time:   Site:   Days:   Peripheral IV 05/08/17 Left Forearm   05/08/17    1723    Forearm   less than 1   Peripheral IV 05/08/17 Left Arm   05/08/17    1808    Arm   less than 1          Labs/Imaging Results for orders placed or performed during the hospital encounter of 05/08/17 (from the past 48 hour(s))  Comprehensive metabolic panel     Status: Abnormal   Collection Time: 05/08/17  5:50 PM  Result Value Ref Range   Sodium 136 135 - 145 mmol/L    Potassium 3.4 (L) 3.5 - 5.1 mmol/L   Chloride 106 101 - 111 mmol/L   CO2 19 (L) 22 - 32 mmol/L   Glucose, Bld 152 (H) 65 - 99 mg/dL   BUN 21 (H) 6 - 20 mg/dL   Creatinine, Ser 1.43 (H) 0.44 - 1.00 mg/dL   Calcium 8.7 (L) 8.9 - 10.3 mg/dL   Total Protein 6.7 6.5 - 8.1 g/dL   Albumin 3.3 (L) 3.5 - 5.0 g/dL   AST 80 (H) 15 - 41 U/L   ALT 31 14 - 54 U/L   Alkaline Phosphatase 72 38 - 126 U/L   Total Bilirubin 0.6 0.3 - 1.2 mg/dL   GFR calc non Af Amer 34 (L) >60 mL/min   GFR calc Af Amer 39 (L) >60 mL/min    Comment: (NOTE) The eGFR has been calculated using the CKD EPI equation. This calculation has not been validated in all clinical situations. eGFR's persistently <60 mL/min signify possible Chronic Kidney Disease.    Anion gap 11 5 - 15    Comment: Performed at [facility], [place] [address], [place], [place] [address]  Lipase, blood     Status: None   Collection Time:  05/08/17  5:50 PM  Result Value Ref Range   Lipase 28 11 - 51 U/L    Comment: Performed at [facility], [place] [address], [place], [place] [address]  CBC with Differential     Status: Abnormal   Collection Time: 05/08/17  5:50 PM  Result Value Ref Range   WBC 30.7 (H) 4.0 - 10.5 K/uL   RBC 4.70 3.87 - 5.11 MIL/uL   Hemoglobin 13.8 12.0 - 15.0 g/dL   HCT 40.3 36.0 - 46.0 %   MCV 85.7 78.0 - 100.0 fL   MCH 29.4 26.0 - 34.0 pg   MCHC 34.2 30.0 - 36.0 g/dL   RDW 12.6 11.5 - 15.5 %   Platelets 153 150 - 400 K/uL    Comment: RESULT REPEATED AND VERIFIED SPECIMEN CHECKED FOR CLOTS PLATELET COUNT CONFIRMED BY SMEAR    Neutrophils Relative % 84 %   Lymphocytes Relative 4 %   Monocytes Relative 12 %   Eosinophils Relative 0 %   Basophils Relative 0 %   Neutro Abs 25.8 (H) 1.7 - 7.7 K/uL   Lymphs Abs 1.2 0.7 - 4.0 K/uL   Monocytes Absolute 3.7 (H) 0.1 - 1.0 K/uL   Eosinophils Absolute 0.0 0.0 - 0.7 K/uL   Basophils Absolute 0.0 0.0 - 0.1 K/uL   Smear Review MORPHOLOGY  UNREMARKABLE     Comment: Performed at [facility], [place] [address] [place], [place] [address]  I-Stat CG4 Lactic Acid, ED     Status: Abnormal   Collection Time: 05/08/17  6:04 PM  Result Value Ref Range   Lactic Acid, Venous 2.71 (HH) 0.5 - 1.9 mmol/L   Comment NOTIFIED PHYSICIAN   I-Stat CG4 Lactic Acid, ED     Status: None   Collection Time: 05/08/17  8:30 PM  Result Value Ref Range   Lactic Acid, Venous 1.68 0.5 - 1.9 mmol/L   Dg Chest Port 1 View  Result Date: 05/08/2017 CLINICAL DATA:  Fever with nausea and vomiting. Nonproductive cough. EXAM: PORTABLE CHEST 1 VIEW COMPARISON:  11/06/2014. FINDINGS: The heart size is increased, but mediastinal contours are within normal limits. Both lungs are clear. The visualized skeletal structures are unremarkable. Thoracic atherosclerosis. IMPRESSION: No active disease.  Similar appearance to priors. Electronically Signed   By: Staci Righter M.D.   On: 05/08/2017 18:40    Pending Labs Unresulted Labs (From admission, onward)   Start     Ordered   05/15/17 0500  Creatinine, serum  (enoxaparin (LOVENOX)    CrCl >/= 30 ml/min)  Weekly,   R    Comments:  while on enoxaparin therapy    05/08/17 2126   05/09/17 7628  Basic metabolic panel  Tomorrow morning,   R     05/08/17 2126   05/09/17 0500  CBC  Tomorrow morning,   R     05/08/17 2126   05/08/17 2126  Troponin I  Now then every 6 hours,   R     05/08/17 2126   05/08/17 2126  Influenza panel by PCR (type A & B)  (Influenza PCR Panel)  Once,   R     05/08/17 2126   05/08/17 2124  CBC  (enoxaparin (LOVENOX)    CrCl >/= 30 ml/min)  Once,   R    Comments:  Baseline for enoxaparin therapy IF NOT ALREADY DRAWN.  Notify MD if PLT < 100 K.    05/08/17 2126   05/08/17 2124  Creatinine,  serum  (enoxaparin (LOVENOX)    CrCl >/= 30 ml/min)  Once,   R    Comments:  Baseline for enoxaparin therapy IF NOT ALREADY DRAWN.    05/08/17 2126   05/08/17 1753  Culture, blood (routine x  2)  BLOOD CULTURE X 2,   STAT     05/08/17 1752   05/08/17 1753  Urinalysis, Routine w reflex microscopic  STAT,   STAT     05/08/17 1752      Vitals/Pain Today's Vitals   05/08/17 2030 05/08/17 2100 05/08/17 2130 05/08/17 2143  BP: (!) 113/53 (!) 132/56 (!) 108/56   Pulse: 84 79 73   Resp: (!) 21 19 (!) 21   Temp:      TempSrc:      SpO2: 93% 94% 90%   Weight:    114 lb (51.7 kg)  Height:    '5\' 1"'$  (1.549 m)    Isolation Precautions Droplet precaution  Medications Medications  aspirin EC tablet 81 mg (not administered)  felodipine (PLENDIL) 24 hr tablet 10 mg (not administered)  nitroGLYCERIN (NITROSTAT) SL tablet 0.4 mg (not administered)  acetaminophen (TYLENOL) tablet 650 mg (not administered)    Or  acetaminophen (TYLENOL) suppository 650 mg (not administered)  ondansetron (ZOFRAN) tablet 4 mg (not administered)    Or  ondansetron (ZOFRAN) injection 4 mg (not administered)  enoxaparin (LOVENOX) injection 40 mg (not administered)  0.9 %  sodium chloride infusion (not administered)  sodium chloride 0.9 % bolus 500 mL (0 mLs Intravenous Stopped 05/08/17 2008)  ondansetron (ZOFRAN) injection 4 mg (4 mg Intravenous Given 05/08/17 1807)  sodium chloride 0.9 % bolus 500 mL (0 mLs Intravenous Stopped 05/08/17 2008)  piperacillin-tazobactam (ZOSYN) IVPB 3.375 g (0 g Intravenous Stopped 05/08/17 2056)  vancomycin (VANCOCIN) IVPB 1000 mg/200 mL premix (0 mg Intravenous Stopped 05/08/17 2127)  0.9 %  sodium chloride infusion ( Intravenous New Bag/Given 05/08/17 2023)    Mobility walks

## 2017-05-08 NOTE — Progress Notes (Signed)
Lovenox per Pharmacy for DVT Prophylaxis    Pharmacy has been consulted from dosing enoxaparin (lovenox) in this patient for DVT prophylaxis.  The pharmacist has reviewed pertinent labs (Hgb _13.8__; PLT_153__), patient weight (_51.7__kg) and renal function (CrCl_23__mL/min) and decided that enoxaparin _30_mg SQ Q24Hrs is appropriate for this patient.  The pharmacy department will sign off at this time.  Please reconsult pharmacy if status changes or for further issues.  Thank you  Cyndia Diver PharmD, BCPS  05/08/2017, 10:22 PM

## 2017-05-09 ENCOUNTER — Observation Stay (HOSPITAL_BASED_OUTPATIENT_CLINIC_OR_DEPARTMENT_OTHER): Payer: Medicare Other

## 2017-05-09 ENCOUNTER — Other Ambulatory Visit: Payer: Self-pay

## 2017-05-09 ENCOUNTER — Observation Stay (HOSPITAL_COMMUNITY): Payer: Medicare Other

## 2017-05-09 DIAGNOSIS — Z90711 Acquired absence of uterus with remaining cervical stump: Secondary | ICD-10-CM | POA: Diagnosis not present

## 2017-05-09 DIAGNOSIS — Z7982 Long term (current) use of aspirin: Secondary | ICD-10-CM | POA: Diagnosis not present

## 2017-05-09 DIAGNOSIS — N179 Acute kidney failure, unspecified: Secondary | ICD-10-CM | POA: Diagnosis present

## 2017-05-09 DIAGNOSIS — A084 Viral intestinal infection, unspecified: Secondary | ICD-10-CM | POA: Diagnosis present

## 2017-05-09 DIAGNOSIS — Z87891 Personal history of nicotine dependence: Secondary | ICD-10-CM | POA: Diagnosis not present

## 2017-05-09 DIAGNOSIS — Z955 Presence of coronary angioplasty implant and graft: Secondary | ICD-10-CM | POA: Diagnosis not present

## 2017-05-09 DIAGNOSIS — Z8249 Family history of ischemic heart disease and other diseases of the circulatory system: Secondary | ICD-10-CM | POA: Diagnosis not present

## 2017-05-09 DIAGNOSIS — K929 Disease of digestive system, unspecified: Secondary | ICD-10-CM

## 2017-05-09 DIAGNOSIS — E860 Dehydration: Secondary | ICD-10-CM | POA: Diagnosis present

## 2017-05-09 DIAGNOSIS — K760 Fatty (change of) liver, not elsewhere classified: Secondary | ICD-10-CM | POA: Diagnosis present

## 2017-05-09 DIAGNOSIS — I2510 Atherosclerotic heart disease of native coronary artery without angina pectoris: Secondary | ICD-10-CM | POA: Diagnosis present

## 2017-05-09 DIAGNOSIS — R748 Abnormal levels of other serum enzymes: Secondary | ICD-10-CM

## 2017-05-09 DIAGNOSIS — I10 Essential (primary) hypertension: Secondary | ICD-10-CM

## 2017-05-09 DIAGNOSIS — I129 Hypertensive chronic kidney disease with stage 1 through stage 4 chronic kidney disease, or unspecified chronic kidney disease: Secondary | ICD-10-CM | POA: Diagnosis present

## 2017-05-09 DIAGNOSIS — N189 Chronic kidney disease, unspecified: Secondary | ICD-10-CM | POA: Diagnosis present

## 2017-05-09 DIAGNOSIS — K8020 Calculus of gallbladder without cholecystitis without obstruction: Secondary | ICD-10-CM | POA: Diagnosis present

## 2017-05-09 LAB — ECHOCARDIOGRAM COMPLETE
E decel time: 282 msec
EERAT: 11.64
Height: 63 in
LV E/e' medial: 11.64
LV E/e'average: 11.64
LV TDI E'LATERAL: 6.96
LVELAT: 6.96 cm/s
MV Dec: 282
MV Peak grad: 3 mmHg
MV pk E vel: 81 m/s
MVPKAVEL: 108 m/s
RV TAPSE: 16.3 mm
TDI e' medial: 7.4
Weight: 1844.81 oz

## 2017-05-09 LAB — BASIC METABOLIC PANEL
Anion gap: 8 (ref 5–15)
BUN: 20 mg/dL (ref 6–20)
CHLORIDE: 111 mmol/L (ref 101–111)
CO2: 22 mmol/L (ref 22–32)
CREATININE: 1.32 mg/dL — AB (ref 0.44–1.00)
Calcium: 8 mg/dL — ABNORMAL LOW (ref 8.9–10.3)
GFR calc Af Amer: 43 mL/min — ABNORMAL LOW (ref >60)
GFR calc non Af Amer: 37 mL/min — ABNORMAL LOW (ref >60)
Glucose, Bld: 100 mg/dL — ABNORMAL HIGH (ref 65–99)
POTASSIUM: 3.6 mmol/L (ref 3.5–5.1)
SODIUM: 141 mmol/L (ref 135–145)

## 2017-05-09 LAB — CBC
HEMATOCRIT: 36.9 % (ref 36.0–46.0)
Hemoglobin: 12.4 g/dL (ref 12.0–15.0)
MCH: 29 pg (ref 26.0–34.0)
MCHC: 33.6 g/dL (ref 30.0–36.0)
MCV: 86.4 fL (ref 78.0–100.0)
PLATELETS: 153 10*3/uL (ref 150–400)
RBC: 4.27 MIL/uL (ref 3.87–5.11)
RDW: 12.9 % (ref 11.5–15.5)
WBC: 19.1 10*3/uL — AB (ref 4.0–10.5)

## 2017-05-09 LAB — TROPONIN I
Troponin I: 0.53 ng/mL (ref <0.03)
Troponin I: 0.45 ng/mL (ref <0.03)

## 2017-05-09 MED ORDER — PIPERACILLIN-TAZOBACTAM 3.375 G IVPB
3.3750 g | Freq: Three times a day (TID) | INTRAVENOUS | Status: DC
  Administered 2017-05-09 – 2017-05-10 (×4): 3.375 g via INTRAVENOUS
  Filled 2017-05-09 (×4): qty 50

## 2017-05-09 MED ORDER — VANCOMYCIN HCL IN DEXTROSE 750-5 MG/150ML-% IV SOLN: 750.0000 mg | INTRAVENOUS | Status: DC

## 2017-05-09 MED ORDER — ADULT MULTIVITAMIN W/MINERALS CH
1.0000 | ORAL_TABLET | Freq: Every day | ORAL | Status: DC
  Administered 2017-05-09 – 2017-05-10 (×2): 1 via ORAL
  Filled 2017-05-09 (×2): qty 1

## 2017-05-09 MED ORDER — HYDRALAZINE HCL 20 MG/ML IJ SOLN: 10.0000 mg | INTRAMUSCULAR | Status: DC | PRN

## 2017-05-09 NOTE — Consult Note (Signed)
CARDIOLOGY CONSULT NOTE       Patient ID: Kynnedy Carreno MRN: [id number] DOB/AGE: 11-Dec-1934 83 y.o.  Admit date: 05/08/2017 Referring Physician: Rodena Piety Primary Physician: Leonard Downing, MD Primary Cardiologist: Johnsie Cancel Reason for Consultation: Elevated Troponin   Principal Problem:   Dehydration Active Problems:   Essential hypertension   Nausea & vomiting   HPI:  83 y.o. history of CAD. Stent to OM in 2008 Stopped smoking in 2010.  Last myovue 07/2014 normal CRF;s HTN. Known carotid disease with 78-29% LICA stenosis by duplex April 2016 She primarily follows With her primary Dr Arelia Sneddon. Admitted with malaise, nausea, vomiting and diarrhea. Poor PO intake WBC elevated  Known GB stone diagnosed 6 months ago. [place] today with cholelithiasis and sludge no acute findings. No chest Pain dyspnea palpitations or syncope. Doing better now Ate some pears and had ensure today. ECG non acute Troponin .58 .53 no evolution. WBC still elevated but down 30.7->19.1 Afebrile no sick contacts Lactate initially Elevated 2.71   ROS All other systems reviewed and negative except as noted above  Past Medical History:  Diagnosis Date  . Anxiety    pt denies  . Arthritis    back, legs  . CAD (coronary artery disease) 2008   s/p Taxus Element Perseus Study stent (DES) to OM1 in 08/2006; EF 65%  . HTN (hypertension)   . Hypercholesterolemia   . Pneumonia 5/16   hospitalized for 4 days    Family History  Problem Relation Age of Onset  . Heart failure Mother   . Hypertension Mother     Social History   Socioeconomic History  . Marital status: Married    Spouse name: Not on file  . Number of children: Not on file  . Years of education: Not on file  . Highest education level: Not on file  Social Needs  . Financial resource strain: Not on file  . Food insecurity - worry: Not on file  . Food insecurity - inability: Not on file  . Transportation needs - medical: Not on file  .  Transportation needs - non-medical: Not on file  Occupational History  . Not on file  Tobacco Use  . Smoking status: Former Smoker    Packs/day: 1.00    Years: 40.00    Pack years: 40.00    Types: Cigarettes    Last attempt to quit: 06/13/2011    Years since quitting: 5.9  . Smokeless tobacco: Never Used  Substance and Sexual Activity  . Alcohol use: No  . Drug use: No  . Sexual activity: Not on file  Other Topics Concern  . Not on file  Social History Narrative  . Not on file    Past Surgical History:  Procedure Laterality Date  . APPENDECTOMY  1950  . CORONARY ANGIOPLASTY WITH STENT PLACEMENT    . GANGLION CYST EXCISION Right 12/26/2014   Procedure: EXCISON OF RIGHT FOOT GANGLION CYST;  Surgeon: Wylene Simmer, MD;  Location: [place];  Service: Orthopedics;  Laterality: Right;  . lower back surgery  1976  . NECK SURGERY  1990  . PARTIAL HYSTERECTOMY  1977  . PCI of hte lesion in th marginal branch circumflex artery       . aspirin EC  81 mg Oral Daily  . enoxaparin (LOVENOX) injection  30 mg Subcutaneous QHS  . feeding supplement (ENSURE ENLIVE)  237 mL Oral BID BM  . multivitamin with minerals  1 tablet Oral Daily   . sodium  chloride 75 mL/hr at 05/09/17 1443  . piperacillin-tazobactam (ZOSYN)  IV 3.375 g (05/09/17 1427)    Physical Exam: Blood pressure 122/67, pulse 92, temperature 98.9 F (37.2 C), temperature source Oral, resp. rate 18, height 5\' 3"  (1.6 m), weight 115 lb 4.8 oz (52.3 kg), SpO2 95 %.    Affect appropriate Healthy:  appears stated age 83: normal Neck supple with no adenopathy JVP normal left  bruits no thyromegaly Lungs clear with no wheezing and good diaphragmatic motion Heart:  S1/S2 no  murmur, no rub, gallop or click PMI normal Abdomen: benighn, BS positve, no tenderness, no AAA no bruit.  No HSM or HJR Distal pulses intact with no bruits No edema Neuro non-focal Skin warm and dry No muscular weakness   Labs:     Lab Results  Component Value Date   WBC 19.1 (H) 05/09/2017   HGB 12.4 05/09/2017   HCT 36.9 05/09/2017   MCV 86.4 05/09/2017   PLT 153 05/09/2017    Recent Labs  Lab 05/08/17 1750 05/09/17 0415  NA 136 141  K 3.4* 3.6  CL 106 111  CO2 19* 22  BUN 21* 20  CREATININE 1.43* 1.32*  CALCIUM 8.7* 8.0*  PROT 6.7  --   BILITOT 0.6  --   ALKPHOS 72  --   ALT 31  --   AST 80*  --   GLUCOSE 152* 100*   Lab Results  Component Value Date   TROPONINI 0.45 (HH) 05/09/2017    Lab Results  Component Value Date   CHOL 233 (H) 01/06/2011   CHOL 184 09/06/2006   Lab Results  Component Value Date   HDL 60.10 01/06/2011   HDL 48.9 09/06/2006   Lab Results  Component Value Date   LDLCALC 115 (H) 09/06/2006   Lab Results  Component Value Date   TRIG 86.0 01/06/2011   TRIG 101 09/06/2006   Lab Results  Component Value Date   CHOLHDL 4 01/06/2011   CHOLHDL 3.8 CALC 09/06/2006   Lab Results  Component Value Date   LDLDIRECT 157.3 01/06/2011      Radiology: US Abdomen Complete  Result Date: 05/09/2017 CLINICAL DATA:  Nausea for 1 day. EXAM: ABDOMEN ULTRASOUND COMPLETE COMPARISON:  None. FINDINGS: Gallbladder: Nonshadowing echogenic debris and shadowing echogenic stones are seen within. Largest stone measures 9 mm. No wall thickening. Negative sonographic Murphy sign. Common bile duct: Diameter: 1 mm, within normal limits. Liver: Diffusely increased in echogenicity. Portal vein is patent on color Doppler imaging with normal direction of blood flow towards the liver. IVC: No abnormality visualized. Pancreas: Visualized portion unremarkable. Spleen: Size and appearance within normal limits. Right Kidney: Length: 12.0 cm. Echogenicity within normal limits. No mass or hydronephrosis visualized. Left Kidney: Length: 9.4 cm. Parenchymal echogenicity is within normal limits. Cortical thinning. No hydronephrosis. No mass. Abdominal aorta: No aneurysm visualized. Other findings: None.  IMPRESSION: 1. No acute findings to explain the patient's symptoms. 2. Cholelithiasis and sludge. 3. Hepatic steatosis. 4. Mild left renal atrophy. Electronically Signed   By: Lorin Picket M.D.   On: 05/09/2017 08:27   Dg Chest Port 1 View  Result Date: 05/08/2017 CLINICAL DATA:  Fever with nausea and vomiting. Nonproductive cough. EXAM: PORTABLE CHEST 1 VIEW COMPARISON:  11/06/2014. FINDINGS: The heart size is increased, but mediastinal contours are within normal limits. Both lungs are clear. The visualized skeletal structures are unremarkable. Thoracic atherosclerosis. IMPRESSION: No active disease.  Similar appearance to priors. Electronically Signed   By: Jenny Reichmann  Alfonse Flavors M.D.   On: 05/08/2017 18:40    EKG: SR J point elevation in inferior leads with artifact    ASSESSMENT AND PLAN:  Elevated Troponin:  No chest pain no evolution of troponin Echo today reviewed and no RWMA;s EF 60-65% Distant history of stent OM with normal myovue 2017  No indication for further w/u Will arrange outpatient F/u discussed possible outpatient myuovue with husband and daugther  GI:  Acute GI illness seems to be improving Concern for infection given elevated WBC and lactate On Zosyn And starting to take PO plan per primary service   HTN:  On plendil at home held during admission due to dehydration stable   Carotid: known bruit will arrange outpatient f/u duplex no TIA symptoms ASA for CAD and PVD   Signed: Jenkins Rouge 05/09/2017, 4:08 PM

## 2017-05-09 NOTE — Care Management Note (Signed)
Case Management Note  Patient Details  Name: [name] [name] MRN: [id number] Date of Birth: 03-06-1935  Subjective/Objective: 83 y/o f admitted w/dehydration. Hx: CAD. From home. PT cons-await recc. CM referral for benefit check on lovenox-await response.                  Action/Plan:d/c plan home.   Expected Discharge Date:                  Expected Discharge Plan:  Home/Self Care  In-House Referral:     Discharge planning Services  CM Consult  Post Acute Care Choice:    Choice offered to:     DME Arranged:    DME Agency:     HH Arranged:    HH Agency:     Status of Service:  In process, will continue to follow  If discussed at Long Length of Stay Meetings, dates discussed:    Additional Comments:  Dessa Phi, RN 05/09/2017, 11:05 AM

## 2017-05-09 NOTE — Plan of Care (Signed)
  Education: Knowledge of General Education information will improve 05/09/2017 1158 - Progressing by Bertrum Sol, RN

## 2017-05-09 NOTE — Progress Notes (Signed)
PROGRESS NOTE    [name] [name]  WUJ:[id number] DOB: 1934-09-28 DOA: 05/08/2017 PCP: Leonard Downing, MD   Brief Narrative: 83 y.o. female with history of CAD status post stenting, hypertension presents to the ER with complaints of feeling weak poor appetite and had nausea vomiting and diarrhea.  3 days ago patient had multiple episodes of nausea vomiting followed by at least 2 episodes of diarrhea.  Has not been feeling well has not taken her medications and has had poor appetite.  Denies any abdominal pain chest pain or shortness of breath.  Feeling generally unwell.  ED Course: The ER labs revealed markedly elevated WBC count patient was afebrile.  Blood cultures obtained since packet also was elevated patient was started on empiric antibiotics.  UA still pending.  On exam patient's abdomen appears benign.  Patient denies any productive cough or chest pain.  As per the husband patient was diagnosed with gallbladder stones 6 months ago and at the time she did not have any abdominal pain. 05/09/2017-no further vomiting,no diarhea.had ensure.  Assessment & Plan:   Principal Problem:   Dehydration Active Problems:   Essential hypertension   Nausea & vomiting  1]nausea/vomiting-possible gastroenteritis.better.ultrasound showsNo acute findings to explain the patient's symptoms. 2. Cholelithiasis and sludge. 3. Hepatic steatosis. 4. Mild left renal atrophy.  2]leukocytosis-improving on iv zosyn and ivf.?source of infection#1 vs uti.  3]cad/stent -elevated troponin no acute ekg changes.cards to see tomorrow.on asa.   DVT prophylaxis:lovenox Code Status:full Family Communication dw son Disposition Plan:home Consultants:  cardiology Procedures: none Antimicrobials:zosyn Subjective:nausea better   Objective: Vitals:   05/08/17 2143 05/08/17 2223 05/08/17 2229 05/09/17 0633  BP:   (!) 115/46 (!) 113/53  Pulse:   75 90  Resp:    20  Temp:   97.8 F (36.6 C) 98.3 F (36.8 C)    TempSrc:   Oral Oral  SpO2:   92% 94%  Weight: 51.7 kg (114 lb)  52.3 kg (115 lb 4.8 oz)   Height: 5\' 1"  (1.549 m) 5\' 3"  (1.6 m)      Intake/Output Summary (Last 24 hours) at 05/09/2017 1244 Last data filed at 05/09/2017 1100 Gross per 24 hour  Intake 1738.75 ml  Output 750 ml  Net 988.75 ml   Filed Weights   05/08/17 2143 05/08/17 2229  Weight: 51.7 kg (114 lb) 52.3 kg (115 lb 4.8 oz)    Examination:  General exam: Appears calm and comfortable  Respiratory system: Clear to auscultation. Respiratory effort normal. Cardiovascular system: S1 & S2 heard, RRR. No JVD, murmurs, rubs, gallops or clicks. No pedal edema. Gastrointestinal system: Abdomen is nondistended, soft and nontender. No organomegaly or masses felt. Normal bowel sounds heard. Central nervous system: Alert and oriented. No focal neurological deficits. Extremities: Symmetric 5 x 5 power. Skin: No rashes, lesions or ulcers Psychiatry: Judgement and insight appear normal. Mood & affect appropriate.     Data Reviewed: I have personally reviewed following labs and imaging studies  CBC: Recent Labs  Lab 05/08/17 1750 05/09/17 0415  WBC 30.7* 19.1*  NEUTROABS 25.8*  --   HGB 13.8 12.4  HCT 40.3 36.9  MCV 85.7 86.4  PLT 153 956   Basic Metabolic Panel: Recent Labs  Lab 05/08/17 1750 05/09/17 0415  NA 136 141  K 3.4* 3.6  CL 106 111  CO2 19* 22  GLUCOSE 152* 100*  BUN 21* 20  CREATININE 1.43* 1.32*  CALCIUM 8.7* 8.0*   GFR: Estimated Creatinine Clearance: 28.1 mL/min (A) (by  C-G formula based on SCr of 1.32 mg/dL (H)). Liver Function Tests: Recent Labs  Lab 05/08/17 1750  AST 80*  ALT 31  ALKPHOS 72  BILITOT 0.6  PROT 6.7  ALBUMIN 3.3*   Recent Labs  Lab 05/08/17 1750  LIPASE 28   No results for input(s): AMMONIA in the last 168 hours. Coagulation Profile: No results for input(s): INR, PROTIME in the last 168 hours. Cardiac Enzymes: Recent Labs  Lab 05/08/17 2232 05/09/17 0415  05/09/17 1012  TROPONINI 0.58* 0.53* 0.45*   BNP (last 3 results) No results for input(s): PROBNP in the last 8760 hours. HbA1C: No results for input(s): HGBA1C in the last 72 hours. CBG: No results for input(s): GLUCAP in the last 168 hours. Lipid Profile: No results for input(s): CHOL, HDL, LDLCALC, TRIG, CHOLHDL, LDLDIRECT in the last 72 hours. Thyroid Function Tests: No results for input(s): TSH, T4TOTAL, FREET4, T3FREE, THYROIDAB in the last 72 hours. Anemia Panel: No results for input(s): VITAMINB12, FOLATE, FERRITIN, TIBC, IRON, RETICCTPCT in the last 72 hours. Sepsis Labs: Recent Labs  Lab 05/08/17 1804 05/08/17 2030  LATICACIDVEN 2.71* 1.68    No results found for this or any previous visit (from the past 240 hour(s)).       Radiology Studies: US Abdomen Complete  Result Date: 05/09/2017 CLINICAL DATA:  Nausea for 1 day. EXAM: ABDOMEN ULTRASOUND COMPLETE COMPARISON:  None. FINDINGS: Gallbladder: Nonshadowing echogenic debris and shadowing echogenic stones are seen within. Largest stone measures 9 mm. No wall thickening. Negative sonographic Murphy sign. Common bile duct: Diameter: 1 mm, within normal limits. Liver: Diffusely increased in echogenicity. Portal vein is patent on color Doppler imaging with normal direction of blood flow towards the liver. IVC: No abnormality visualized. Pancreas: Visualized portion unremarkable. Spleen: Size and appearance within normal limits. Right Kidney: Length: 12.0 cm. Echogenicity within normal limits. No mass or hydronephrosis visualized. Left Kidney: Length: 9.4 cm. Parenchymal echogenicity is within normal limits. Cortical thinning. No hydronephrosis. No mass. Abdominal aorta: No aneurysm visualized. Other findings: None. IMPRESSION: 1. No acute findings to explain the patient's symptoms. 2. Cholelithiasis and sludge. 3. Hepatic steatosis. 4. Mild left renal atrophy. Electronically Signed   By: Lorin Picket M.D.   On: 05/09/2017  08:27   Dg Chest Port 1 View  Result Date: 05/08/2017 CLINICAL DATA:  Fever with nausea and vomiting. Nonproductive cough. EXAM: PORTABLE CHEST 1 VIEW COMPARISON:  11/06/2014. FINDINGS: The heart size is increased, but mediastinal contours are within normal limits. Both lungs are clear. The visualized skeletal structures are unremarkable. Thoracic atherosclerosis. IMPRESSION: No active disease.  Similar appearance to priors. Electronically Signed   By: Staci Righter M.D.   On: 05/08/2017 18:40        Scheduled Meds: . aspirin EC  81 mg Oral Daily  . enoxaparin (LOVENOX) injection  30 mg Subcutaneous QHS  . feeding supplement (ENSURE ENLIVE)  237 mL Oral BID BM  . multivitamin with minerals  1 tablet Oral Daily   Continuous Infusions: . sodium chloride 75 mL/hr at 05/08/17 2221  . piperacillin-tazobactam (ZOSYN)  IV Stopped (05/09/17 1036)  . [START ON 05/10/2017] vancomycin       LOS: 0 days      Georgette Shell, MD [facility]  If 7PM-7AM, please contact night-coverage [email or web address] Password [facility] 05/09/2017, 12:44 PM

## 2017-05-09 NOTE — Evaluation (Signed)
Physical Therapy Evaluation Patient Details Name: [name] [name] MRN: [id number] DOB: 04/21/1934 Today's Date: 05/09/2017   History of Present Illness  83 yo female admitted with dehydration, N/V, weakness. Hx of CAD, HTN  Clinical Impression  On eval, pt required Min assist for mobility. She walked ~15 feet with a RW. Distance limited by fatigue, wheezing, lightheadedness, and weakness. Dyspnea 2/4. Pt presents with general weakness, decreased activity tolerance, and impaired gait and balance. During session O2 90% on RA, HR 92 bpm. Will recommend HHPT and 24 hour supervision/assist for now. Will follow and progress activity as tolerated. Will continue to update d/c plan as necessary.     Follow Up Recommendations Home health PT;Supervision/Assistance - 24 hour    Equipment Recommendations  Rolling walker with 5" wheels(youth height)    Recommendations for Other Services OT consult     Precautions / Restrictions Precautions Precautions: Fall Restrictions Weight Bearing Restrictions: No      Mobility  Bed Mobility Overal bed mobility: Needs Assistance Bed Mobility: Supine to Sit;Sit to Supine     Supine to sit: Min guard;HOB elevated Sit to supine: Min guard;HOB elevated   General bed mobility comments: Increased time. Mildly effortful for pt. cues for technique.  Transfers Overall transfer level: Needs assistance Equipment used: Rolling walker (2 wheeled) Transfers: Sit to/from Stand Sit to Stand: Min assist         General transfer comment: Assist to rise, stabilize, control descent. VCs safety, hand placement. Unsteady.   Ambulation/Gait Ambulation/Gait assistance: Min assist Ambulation Distance (Feet): 15 Feet Assistive device: Rolling walker (2 wheeled) Gait Pattern/deviations: Step-through pattern;Decreased stride length     General Gait Details: Assist to stabilize pt and maneuver safely with RW. Unsteady. Pt fatigues very easily with minimal activity.  Audible wheezing. Pt c/o lightheadedness.   Stairs            Wheelchair Mobility    Modified Rankin (Stroke Patients Only)       Balance Overall balance assessment: Needs assistance         Standing balance support: Bilateral upper extremity supported Standing balance-Leahy Scale: Poor                               Pertinent Vitals/Pain Pain Assessment: No/denies pain    Home Living Family/patient expects to be discharged to:: Private residence Living Arrangements: Spouse/significant other Available Help at Discharge: Family Type of Home: House Home Access: Stairs to enter Entrance Stairs-Rails: None Technical brewer of Steps: 2 Home Layout: One level Home Equipment: None      Prior Function Level of Independence: Independent               Hand Dominance        Extremity/Trunk Assessment   Upper Extremity Assessment Upper Extremity Assessment: Generalized weakness    Lower Extremity Assessment Lower Extremity Assessment: Generalized weakness    Cervical / Trunk Assessment Cervical / Trunk Assessment: Normal  Communication   Communication: No difficulties  Cognition Arousal/Alertness: Awake/alert Behavior During Therapy: WFL for tasks assessed/performed Overall Cognitive Status: Within Functional Limits for tasks assessed                                        General Comments      Exercises     Assessment/Plan    PT Assessment Patient needs  continued PT services  PT Problem List Decreased strength;Decreased balance;Decreased mobility;Decreased activity tolerance;Decreased knowledge of use of DME       PT Treatment Interventions DME instruction;Gait training;Functional mobility training;Therapeutic activities;Balance training;Patient/family education;Therapeutic exercise    PT Goals (Current goals can be found in the Care Plan section)  Acute Rehab PT Goals Patient Stated Goal: to get  better PT Goal Formulation: With patient/family Time For Goal Achievement: 05/23/17 Potential to Achieve Goals: Good    Frequency Min 3X/week   Barriers to discharge        Co-evaluation               AM-PAC PT "6 Clicks" Daily Activity  Outcome Measure Difficulty turning over in bed (including adjusting bedclothes, sheets and blankets)?: A Little Difficulty moving from lying on back to sitting on the side of the bed? : A Little Difficulty sitting down on and standing up from a chair with arms (e.g., wheelchair, bedside commode, etc,.)?: Unable Help needed moving to and from a bed to chair (including a wheelchair)?: A Little Help needed walking in hospital room?: A Little Help needed climbing 3-5 steps with a railing? : A Lot 6 Click Score: 15    End of Session Equipment Utilized During Treatment: Gait belt Activity Tolerance: Patient limited by fatigue Patient left: in bed;with call bell/phone within reach;with bed alarm set;with family/visitor present   PT Visit Diagnosis: Muscle weakness (generalized) (M62.81);Difficulty in walking, not elsewhere classified (R26.2)    Time: 1884-1660 PT Time Calculation (min) (ACUTE ONLY): 16 min   Charges:   PT Evaluation $PT Eval Moderate Complexity: 1 Mod     PT G Codes:          Weston Anna, MPT Pager: [phone]

## 2017-05-09 NOTE — Care Management Note (Signed)
Case Management Note  Patient Details  Name: [name] [name] MRN: [id number] Date of Birth: 1934/06/26  Subjective/Objective: 83 y/o f admitted w/Dehydration. From home.PT cons-await recc.                   Action/Plan:d/c plan home.   Expected Discharge Date:                  Expected Discharge Plan:  Home/Self Care  In-House Referral:     Discharge planning Services  CM Consult  Post Acute Care Choice:    Choice offered to:     DME Arranged:    DME Agency:     HH Arranged:    HH Agency:     Status of Service:  In process, will continue to follow  If discussed at Long Length of Stay Meetings, dates discussed:    Additional Comments:  Dessa Phi, RN 05/09/2017, 10:10 AM

## 2017-05-09 NOTE — Progress Notes (Signed)
Patient's benefit checked-lovenox co pay $35.22-informed patient.

## 2017-05-09 NOTE — Progress Notes (Signed)
Initial Nutrition Assessment  DOCUMENTATION CODES:   (Will assess for malnutrition at follow-up.)  INTERVENTION:  - Continue Ensure Enlive BID, each supplement provides 350 kcal and 20 grams of protein. - Continue to encourage PO intakes. - RD will monitor for additional nutrition-related needs. - Will perform NFPE at follow-up.  NUTRITION DIAGNOSIS:   Inadequate oral intake related to acute illness, nausea, vomiting, poor appetite as evidenced by per patient/family report.  GOAL:   Patient will meet greater than or equal to 90% of their needs  MONITOR:   PO intake, Supplement acceptance, Weight trends, Labs  REASON FOR ASSESSMENT:   Malnutrition Screening Tool  ASSESSMENT:   83 y.o. female with history of CAD status post stenting, hypertension presents to the ER with complaints of feeling weak poor appetite and had nausea vomiting and diarrhea.  3 days ago patient had multiple episodes of nausea vomiting followed by at least 2 episodes of diarrhea.  Has not been feeling well has not taken her medications and has had poor appetite. Feeling generally unwell.   BMI indicates normal weight. Pt had been feeling nausea and experiencing vomiting for several days PTA. She has not vomited since admission. Pt denies any abdominal pain now or PTA. She had diarrhea in the day PTA but has not had any BMs since admission. She was able to eat a few bites of breakfast but states that appetite is poor and that it has been since N/V began. Flow sheet indicates she consumed 25% of breakfast. Denies any changes in appetite prior to that time. Notes state that pt had gallstones about 6 months ago and husband had stated to other staff that pt was not having abdominal pain around that time. She denies any chewing or swallowing difficulties.   Pt continues to feel unwell and NFPE deferred at this time with respect to pt's comfort. Will assess for ability to perform NFPE at follow-up. Per chart review, it  appears that weight on 05/01/17 (125 lbs) was a stated weight and last scaled weight was on 08/07/15 (126 lbs 12.8 oz). Will need to monitor weight trends closely and ask pt more about this at follow-up.  Medications reviewed; daily multivitamin with minerals. Labs reviewed; creatinine: 1.32 mg/dL, Ca: 8 mg/dL, GFR: 37 mL/min.  IVF: NS @ 75 mL/hr.       NUTRITION - FOCUSED PHYSICAL EXAM:  Unable to perform/assess at this time and will attempt at follow-up.   Diet Order:  Diet Heart Room service appropriate? Yes; Fluid consistency: Thin  EDUCATION NEEDS:   No education needs have been identified at this time  Skin:  Skin Assessment: Reviewed RN Assessment  Last BM:  2/2 (PTA)  Height:   Ht Readings from Last 1 Encounters:  05/08/17 5\' 3"  (1.6 m)    Weight:   Wt Readings from Last 1 Encounters:  05/08/17 115 lb 4.8 oz (52.3 kg)    Ideal Body Weight:  47.72 kg  BMI:  Body mass index is 20.42 kg/m.  Estimated Nutritional Needs:   Kcal:  7782-4235 (25-28 kcal/kg)  Protein:  52-62 grams (1-1.2 grams/kg)  Fluid:  >/= 1.5 L/day     Jarome Matin, MS, RD, LDN, [facility] Clinical Dietitian Pager # [phone] After hours/weekend pager # [phone]

## 2017-05-09 NOTE — Progress Notes (Signed)
Pharmacy Antibiotic Note  [name] [name] is a 83 y.o. female admitted on 05/08/2017 with sepsis.  Pharmacy has been consulted for Vancomycin and Zosyn dosing.  Plan: Vancomycin 1gm iv x1, then 750mg  iv q48hr Goal AUC = 400 - 500 for all indications, except meningitis (goal AUC > 500 and Cmin 15-20 mcg/mL)   Zosyn 3.375gm iv x1, then Zosyn 3.375g IV Q8H infused over 4hrs.   Height: 5\' 3"  (160 cm) Weight: 115 lb 4.8 oz (52.3 kg) IBW/kg (Calculated) : 52.4  Temp (24hrs), Avg:98.6 F (37 C), Min:97.8 F (36.6 C), Max:99.6 F (37.6 C)  Recent Labs  Lab 05/08/17 1750 05/08/17 1804 05/08/17 2030 05/09/17 0415  WBC 30.7*  --   --  19.1*  CREATININE 1.43*  --   --  1.32*  LATICACIDVEN  --  2.71* 1.68  --     Estimated Creatinine Clearance: 28.1 mL/min (A) (by C-G formula based on SCr of 1.32 mg/dL (H)).    No Known Allergies  Antimicrobials this admission: Vancomycin 05/08/2017 >> Zosyn 05/08/2017 >>   Dose adjustments this admission: -  Microbiology results: pending  Thank you for allowing pharmacy to be a part of this patient's care.  Nani Skillern Crowford 05/09/2017 5:35 AM

## 2017-05-09 NOTE — Progress Notes (Signed)
  Echocardiogram 2D Echocardiogram has been performed.  [name] [name] G [name] 05/09/2017, 1:20 PM

## 2017-05-10 LAB — CBC
HCT: 34.8 % — ABNORMAL LOW (ref 36.0–46.0)
Hemoglobin: 11.6 g/dL — ABNORMAL LOW (ref 12.0–15.0)
MCH: 28.6 pg (ref 26.0–34.0)
MCHC: 33.3 g/dL (ref 30.0–36.0)
MCV: 85.9 fL (ref 78.0–100.0)
PLATELETS: 167 10*3/uL (ref 150–400)
RBC: 4.05 MIL/uL (ref 3.87–5.11)
RDW: 13.1 % (ref 11.5–15.5)
WBC: 13.4 10*3/uL — ABNORMAL HIGH (ref 4.0–10.5)

## 2017-05-10 LAB — BASIC METABOLIC PANEL
Anion gap: 6 (ref 5–15)
BUN: 18 mg/dL (ref 6–20)
CALCIUM: 8.2 mg/dL — AB (ref 8.9–10.3)
CO2: 23 mmol/L (ref 22–32)
CREATININE: 1.25 mg/dL — AB (ref 0.44–1.00)
Chloride: 112 mmol/L — ABNORMAL HIGH (ref 101–111)
GFR calc Af Amer: 46 mL/min — ABNORMAL LOW (ref >60)
GFR, EST NON AFRICAN AMERICAN: 40 mL/min — AB (ref >60)
GLUCOSE: 103 mg/dL — AB (ref 65–99)
Potassium: 3.7 mmol/L (ref 3.5–5.1)
Sodium: 141 mmol/L (ref 135–145)

## 2017-05-10 LAB — TROPONIN I: Troponin I: 0.21 ng/mL (ref <0.03)

## 2017-05-10 MED ORDER — CEFPODOXIME PROXETIL 200 MG PO TABS
100.0000 mg | ORAL_TABLET | Freq: Every day | ORAL | Status: DC
  Administered 2017-05-10: 100 mg via ORAL
  Filled 2017-05-10: qty 1

## 2017-05-10 MED ORDER — ADULT MULTIVITAMIN W/MINERALS CH: 1.0000 | ORAL_TABLET | Freq: Every day | ORAL | Status: AC

## 2017-05-10 MED ORDER — CEFPODOXIME PROXETIL 100 MG PO TABS: 100.0000 mg | ORAL_TABLET | Freq: Every day | ORAL | 0 refills | Status: DC

## 2017-05-10 NOTE — Discharge Summary (Signed)
Physician Discharge Summary  [name] [name] ZOX:[id number] DOB: April 13, 1934 DOA: 05/08/2017  PCP: Leonard Downing, MD  Admit date: 05/08/2017 Discharge date: 05/10/2017  Admitted From: Home Disposition: Home Recommendations for Outpatient Follow-up:  1. Follow up with PCP in 1-2 weeks 2. Please obtain BMP/CBC in one week  Home Health:[facility] Equipment/Devices: None Discharge Condition: Stable CODE STATUS: Full code Diet recommendation cardiac Brief/Interim Summary:82 y.o.femalewithhistory of CAD status post stenting, hypertension presents to the ER with complaints of feeling weak poor appetite and had nausea vomiting and diarrhea. 3 days ago patient had multiple episodes of nausea vomiting followed by at least 2 episodes of diarrhea. Has not been feeling well has not taken her medications and has had poor appetite. Denies any abdominal pain chest pain or shortness of breath. Feeling generally unwell.  ED Course:The ER labs revealed markedly elevated WBC count patient was afebrile. Blood cultures obtained since packet also was elevated patient was started on empiric antibiotics. UA still pending. On exam patient's abdomen appears benign. Patient denies any productive cough or chest pain. As per the husband patient was diagnosed with gallbladder stones 6 months ago and at the time she did not have any abdominal pain. 05/09/2017-no further vomiting,no diarhea.had ensure.   Discharge Diagnoses:  Principal Problem:   Dehydration Active Problems:   Essential hypertension   Nausea & vomiting  1] gastroenteritis patient was admitted with-nausea vomiting.  She was found to have elevated white count.  She was treated with IV fluids and IV Zosyn.  Her white count came down significantly .  She started taking p.o.'s and she was able to tolerate p.o. intake .  She did have a bowel movement before she was discharged home which was a regular bowel movement.  She had an ultrasound of the  gallbladder showed gallstones but no evidence of cholecystitis.  Will discharge this patient on Vantin 100 mg daily for 5 days.  2] elevated troponin I with history of CAD and stent patient was seen by cardiology patient to  follow-up cardiology as an outpatient.  Discharge Instructions  Discharge Instructions    Call MD for:  difficulty breathing, headache or visual disturbances   Complete by:  As directed    Call MD for:  persistant dizziness or light-headedness   Complete by:  As directed    Call MD for:  persistant nausea and vomiting   Complete by:  As directed    Call MD for:  severe uncontrolled pain   Complete by:  As directed    Diet - low sodium heart healthy   Complete by:  As directed    Increase activity slowly   Complete by:  As directed      Allergies as of 05/10/2017   No Known Allergies     Medication List    TAKE these medications   aspirin 81 MG tablet Take 81 mg by mouth daily.   cefpodoxime 100 MG tablet Commonly known as:  VANTIN Take 1 tablet (100 mg total) by mouth daily.   cetirizine 10 MG tablet Commonly known as:  ZYRTEC Take 10 mg by mouth daily.   felodipine 10 MG 24 hr tablet Commonly known as:  PLENDIL Take 10 mg by mouth daily.   multivitamin with minerals Tabs tablet Take 1 tablet by mouth daily. Start taking on:  05/11/2017   nitroGLYCERIN 0.4 MG SL tablet Commonly known as:  NITROSTAT Place 1 tablet (0.4 mg total) under the tongue every 5 (five) minutes as needed for chest  pain.      Follow-up Information    Leonard Downing, MD Follow up.   Specialty:  Family Medicine Contact information: [place] [address] [phone]        Josue Hector, MD Follow up.   Specialty:  Cardiology Contact information: [address] [place] [address] [facility] Follow up.   Specialty:  Home Health Services Why:  [facility] physical  therapy Contact information: [address] [place] [address] [phone]          No Known Allergies  Consultations:   Procedures/Studies: US Abdomen Complete  Result Date: 05/09/2017 CLINICAL DATA:  Nausea for 1 day. EXAM: ABDOMEN ULTRASOUND COMPLETE COMPARISON:  None. FINDINGS: Gallbladder: Nonshadowing echogenic debris and shadowing echogenic stones are seen within. Largest stone measures 9 mm. No wall thickening. Negative sonographic Murphy sign. Common bile duct: Diameter: 1 mm, within normal limits. Liver: Diffusely increased in echogenicity. Portal vein is patent on color Doppler imaging with normal direction of blood flow towards the liver. IVC: No abnormality visualized. Pancreas: Visualized portion unremarkable. Spleen: Size and appearance within normal limits. Right Kidney: Length: 12.0 cm. Echogenicity within normal limits. No mass or hydronephrosis visualized. Left Kidney: Length: 9.4 cm. Parenchymal echogenicity is within normal limits. Cortical thinning. No hydronephrosis. No mass. Abdominal aorta: No aneurysm visualized. Other findings: None. IMPRESSION: 1. No acute findings to explain the patient's symptoms. 2. Cholelithiasis and sludge. 3. Hepatic steatosis. 4. Mild left renal atrophy. Electronically Signed   By: Lorin Picket M.D.   On: 05/09/2017 08:27   Dg Chest Port 1 View  Result Date: 05/08/2017 CLINICAL DATA:  Fever with nausea and vomiting. Nonproductive cough. EXAM: PORTABLE CHEST 1 VIEW COMPARISON:  11/06/2014. FINDINGS: The heart size is increased, but mediastinal contours are within normal limits. Both lungs are clear. The visualized skeletal structures are unremarkable. Thoracic atherosclerosis. IMPRESSION: No active disease.  Similar appearance to priors. Electronically Signed   By: Staci Righter M.D.   On: 05/08/2017 18:40    (Echo, Carotid, EGD, Colonoscopy, ERCP)    Subjective:   Discharge Exam: Vitals:   05/09/17 1954 05/10/17 0500   BP: (!) 144/49 (!) 149/56  Pulse: 90 85  Resp: 18 19  Temp: 99.9 F (37.7 C) 99.3 F (37.4 C)  SpO2: 93% 100%   Vitals:   05/09/17 0633 05/09/17 1428 05/09/17 1954 05/10/17 0500  BP: (!) 113/53 122/67 (!) 144/49 (!) 149/56  Pulse: 90 92 90 85  Resp: 20 18 18 19   Temp: 98.3 F (36.8 C) 98.9 F (37.2 C) 99.9 F (37.7 C) 99.3 F (37.4 C)  TempSrc: Oral Oral Oral Oral  SpO2: 94% 95% 93% 100%  Weight:    53 kg (116 lb 13.5 oz)  Height:        General: Pt is alert, awake, not in acute distress Cardiovascular: RRR, S1/S2 +, no rubs, no gallops Respiratory: CTA bilaterally, no wheezing, no rhonchi Abdominal: Soft, NT, ND, bowel sounds + Extremities: no edema, no cyanosis    The results of significant diagnostics from this hospitalization (including imaging, microbiology, ancillary and laboratory) are listed below for reference.     Microbiology: No results found for this or any previous visit (from the past 240 hour(s)).   Labs: BNP (last 3 results) No results for input(s): BNP in the last 8760 hours. Basic Metabolic Panel: Recent Labs  Lab 05/08/17 1750 05/09/17 0415  05/10/17 0620  NA 136 141 141  K 3.4* 3.6 3.7  CL 106 111 112*  CO2 19* 22 23  GLUCOSE 152* 100* 103*  BUN 21* 20 18  CREATININE 1.43* 1.32* 1.25*  CALCIUM 8.7* 8.0* 8.2*   Liver Function Tests: Recent Labs  Lab 05/08/17 1750  AST 80*  ALT 31  ALKPHOS 72  BILITOT 0.6  PROT 6.7  ALBUMIN 3.3*   Recent Labs  Lab 05/08/17 1750  LIPASE 28   No results for input(s): AMMONIA in the last 168 hours. CBC: Recent Labs  Lab 05/08/17 1750 05/09/17 0415 05/10/17 0620  WBC 30.7* 19.1* 13.4*  NEUTROABS 25.8*  --   --   HGB 13.8 12.4 11.6*  HCT 40.3 36.9 34.8*  MCV 85.7 86.4 85.9  PLT 153 153 167   Cardiac Enzymes: Recent Labs  Lab 05/08/17 2232 05/09/17 0415 05/09/17 1012 05/10/17 0830  TROPONINI 0.58* 0.53* 0.45* 0.21*   BNP: Invalid input(s): POCBNP CBG: No results for  input(s): GLUCAP in the last 168 hours. D-Dimer No results for input(s): DDIMER in the last 72 hours. Hgb A1c No results for input(s): HGBA1C in the last 72 hours. Lipid Profile No results for input(s): CHOL, HDL, LDLCALC, TRIG, CHOLHDL, LDLDIRECT in the last 72 hours. Thyroid function studies No results for input(s): TSH, T4TOTAL, T3FREE, THYROIDAB in the last 72 hours.  Invalid input(s): FREET3 Anemia work up No results for input(s): VITAMINB12, FOLATE, FERRITIN, TIBC, IRON, RETICCTPCT in the last 72 hours. Urinalysis    Component Value Date/Time   COLORURINE YELLOW 05/08/2017 2218   APPEARANCEUR CLOUDY (A) 05/08/2017 2218   LABSPEC 1.010 05/08/2017 2218   PHURINE 5.0 05/08/2017 2218   GLUCOSEU NEGATIVE 05/08/2017 2218   HGBUR LARGE (A) 05/08/2017 2218   BILIRUBINUR NEGATIVE 05/08/2017 2218   KETONESUR NEGATIVE 05/08/2017 2218   PROTEINUR 100 (A) 05/08/2017 2218   UROBILINOGEN 0.2 08/23/2014 1502   NITRITE NEGATIVE 05/08/2017 2218   LEUKOCYTESUR LARGE (A) 05/08/2017 2218   Sepsis Labs Invalid input(s): PROCALCITONIN,  WBC,  LACTICIDVEN Microbiology No results found for this or any previous visit (from the past 240 hour(s)).   Time coordinating discharge: Over 30 minutes  SIGNED:   Georgette Shell, MD  [facility] 05/10/2017, 11:25 AM Pager   If 7PM-7AM, please contact night-coverage [email or web address] Password [email or web address]

## 2017-05-10 NOTE — Care Management Note (Signed)
Case Management Note  Patient Details  Name: [name] [name] MRN: [id number] Date of Birth: [birth year]  Subjective/Objective: Spoke to spouse in rm about [facility] per patient request-chose [facility]-rep Santiago Glad aware of HHPT order, & d/c today.Patient already has rw. No further CM needs.                   Action/Plan:d/c home w/HHC.   Expected Discharge Date:                  Expected Discharge Plan:  [place]  In-House Referral:     Discharge planning Services  CM Consult  Post Acute Care Choice:  Durable Medical Equipment(rw) Choice offered to:  Spouse  DME Arranged:    DME Agency:     HH Arranged:  PT [place]:  [place]  Status of Service:  Completed, signed off  If discussed at [place] of Stay Meetings, dates discussed:    Additional Comments:  Dessa Phi, RN 05/10/2017, 11:22 AM

## 2017-05-10 NOTE — Progress Notes (Signed)
Physical Therapy Treatment Patient Details Name: [name] [name] MRN: [id number] DOB: [birth year] Today's Date: 05/10/2017    History of Present Illness 83 yo female admitted with dehydration, N/V, weakness. Hx of CAD, HTN    PT Comments    Progressing with mobility. Possible d/c home later today per family. Recommend HHPT.    Follow Up Recommendations  Home health PT;Supervision/Assistance - 24 hour     Equipment Recommendations  (family stated RW is available at home)    Recommendations for Other Services       Precautions / Restrictions Precautions Precautions: Fall Restrictions Weight Bearing Restrictions: No    Mobility  Bed Mobility Overal bed mobility: Needs Assistance Bed Mobility: Supine to Sit     Supine to sit: Supervision;HOB elevated     General bed mobility comments: for safety  Transfers Overall transfer level: Needs assistance Equipment used: Rolling walker (2 wheeled) Transfers: Sit to/from Stand Sit to Stand: Min guard         General transfer comment: close guard for safety. VCs hand placement  Ambulation/Gait Ambulation/Gait assistance: Min assist Ambulation Distance (Feet): 115 Feet Assistive device: Rolling walker (2 wheeled) Gait Pattern/deviations: Step-through pattern;Decreased stride length     General Gait Details: Intermittent assist for RW maneuvering and safety. Slow gait speed. Pt tolerate distance well.    Stairs            Wheelchair Mobility    Modified Rankin (Stroke Patients Only)       Balance Overall balance assessment: Needs assistance         Standing balance support: Bilateral upper extremity supported Standing balance-Leahy Scale: Poor                              Cognition Arousal/Alertness: Awake/alert Behavior During Therapy: WFL for tasks assessed/performed Overall Cognitive Status: Within Functional Limits for tasks assessed                                         Exercises      General Comments        Pertinent Vitals/Pain Pain Assessment: No/denies pain    Home Living                      Prior Function            PT Goals (current goals can now be found in the care plan section) Progress towards PT goals: Progressing toward goals    Frequency    Min 3X/week      PT Plan Current plan remains appropriate    Co-evaluation              AM-PAC PT "6 Clicks" Daily Activity  Outcome Measure  Difficulty turning over in bed (including adjusting bedclothes, sheets and blankets)?: A Little Difficulty moving from lying on back to sitting on the side of the bed? : A Little Difficulty sitting down on and standing up from a chair with arms (e.g., wheelchair, bedside commode, etc,.)?: A Little Help needed moving to and from a bed to chair (including a wheelchair)?: A Little Help needed walking in hospital room?: A Little Help needed climbing 3-5 steps with a railing? : A Little 6 Click Score: 18    End of Session Equipment Utilized During Treatment: Gait belt Activity Tolerance: Patient tolerated treatment  well Patient left: in chair;with call bell/phone within reach;with family/visitor present   PT Visit Diagnosis: Muscle weakness (generalized) (M62.81);Difficulty in walking, not elsewhere classified (R26.2)     Time: 8472-0721 PT Time Calculation (min) (ACUTE ONLY): 12 min  Charges:  $Gait Training: 8-22 mins                    G Codes:          Weston Anna, MPT Pager: [phone]

## 2017-05-10 NOTE — Progress Notes (Signed)
Subjective:  Denies SSCP, palpitations or Dyspnea Still with poor PO intake   Objective:  Vitals:   05/09/17 0633 05/09/17 1428 05/09/17 1954 05/10/17 0500  BP: (!) 113/53 122/67 (!) 144/49 (!) 149/56  Pulse: 90 92 90 85  Resp: 20 18 18 19   Temp: 98.3 F (36.8 C) 98.9 F (37.2 C) 99.9 F (37.7 C) 99.3 F (37.4 C)  TempSrc: Oral Oral Oral Oral  SpO2: 94% 95% 93% 100%  Weight:    116 lb 13.5 oz (53 kg)  Height:        Intake/Output from previous day:  Intake/Output Summary (Last 24 hours) at 05/10/2017 0813 Last data filed at 05/10/2017 0600 Gross per 24 hour  Intake 1613.75 ml  Output 300 ml  Net 1313.75 ml    Physical Exam: Affect appropriate Healthy:  appears stated age HEENT: normal Neck supple with no adenopathy JVP normal no bruits no thyromegaly Lungs clear with no wheezing and good diaphragmatic motion Heart:  S1/S2 no murmur, no rub, gallop or click PMI normal Abdomen: mild tympany  no bruit.  No HSM or HJR Distal pulses intact with no bruits No edema Neuro non-focal Skin warm and dry No muscular weakness   Lab Results: Basic Metabolic Panel: Recent Labs    05/09/17 0415 05/10/17 0620  NA 141 141  K 3.6 3.7  CL 111 112*  CO2 22 23  GLUCOSE 100* 103*  BUN 20 18  CREATININE 1.32* 1.25*  CALCIUM 8.0* 8.2*   Liver Function Tests: Recent Labs    05/08/17 1750  AST 80*  ALT 31  ALKPHOS 72  BILITOT 0.6  PROT 6.7  ALBUMIN 3.3*   Recent Labs    05/08/17 1750  LIPASE 28   CBC: Recent Labs    05/08/17 1750 05/09/17 0415 05/10/17 0620  WBC 30.7* 19.1* 13.4*  NEUTROABS 25.8*  --   --   HGB 13.8 12.4 11.6*  HCT 40.3 36.9 34.8*  MCV 85.7 86.4 85.9  PLT 153 153 167   Cardiac Enzymes: Recent Labs    05/08/17 2232 05/09/17 0415 05/09/17 1012  TROPONINI 0.58* 0.53* 0.45*   BNP: Invalid input(s): POCBNP D-Dimer: No results for input(s): DDIMER in the last 72 hours. Hemoglobin A1C: No results for input(s): HGBA1C in the  last 72 hours. Fasting Lipid Panel: No results for input(s): CHOL, HDL, LDLCALC, TRIG, CHOLHDL, LDLDIRECT in the last 72 hours. Thyroid Function Tests: No results for input(s): TSH, T4TOTAL, T3FREE, THYROIDAB in the last 72 hours.  Invalid input(s): FREET3 Anemia Panel: No results for input(s): VITAMINB12, FOLATE, FERRITIN, TIBC, IRON, RETICCTPCT in the last 72 hours.  Imaging: US Abdomen Complete  Result Date: 05/09/2017 CLINICAL DATA:  Nausea for 1 day. EXAM: ABDOMEN ULTRASOUND COMPLETE COMPARISON:  None. FINDINGS: Gallbladder: Nonshadowing echogenic debris and shadowing echogenic stones are seen within. Largest stone measures 9 mm. No wall thickening. Negative sonographic Murphy sign. Common bile duct: Diameter: 1 mm, within normal limits. Liver: Diffusely increased in echogenicity. Portal vein is patent on color Doppler imaging with normal direction of blood flow towards the liver. IVC: No abnormality visualized. Pancreas: Visualized portion unremarkable. Spleen: Size and appearance within normal limits. Right Kidney: Length: 12.0 cm. Echogenicity within normal limits. No mass or hydronephrosis visualized. Left Kidney: Length: 9.4 cm. Parenchymal echogenicity is within normal limits. Cortical thinning. No hydronephrosis. No mass. Abdominal aorta: No aneurysm visualized. Other findings: None. IMPRESSION: 1. No acute findings to explain the patient's symptoms. 2. Cholelithiasis and sludge. 3. Hepatic  steatosis. 4. Mild left renal atrophy. Electronically Signed   By: Lorin Picket M.D.   On: 05/09/2017 08:27   Dg Chest Port 1 View  Result Date: 05/08/2017 CLINICAL DATA:  Fever with nausea and vomiting. Nonproductive cough. EXAM: PORTABLE CHEST 1 VIEW COMPARISON:  11/06/2014. FINDINGS: The heart size is increased, but mediastinal contours are within normal limits. Both lungs are clear. The visualized skeletal structures are unremarkable. Thoracic atherosclerosis. IMPRESSION: No active disease.   Similar appearance to priors. Electronically Signed   By: Staci Righter M.D.   On: 05/08/2017 18:40    Cardiac Studies:  ECG: SR no acute changes    Telemetry:  NSR no arrhythmia   Echo:   Medications:   . aspirin EC  81 mg Oral Daily  . enoxaparin (LOVENOX) injection  30 mg Subcutaneous QHS  . feeding supplement (ENSURE ENLIVE)  237 mL Oral BID BM  . multivitamin with minerals  1 tablet Oral Daily     . piperacillin-tazobactam (ZOSYN)  IV 3.375 g (05/10/17 0549)    Assessment/Plan:  Elevated Troponin: no acute ECG changes no chest pain no plans for further cardiac w/u can consider Outpatient myovue given distant history of OM stent Echo with no RWMA;s and EF 60-65%  GI:  ? Viral gastroenteritis WBC coming down on Zosyn GB US non acute plan per primary service  Will arrange outpatient f/u and sign off   Jenkins Rouge 05/10/2017, 8:13 AM

## 2017-05-12 ENCOUNTER — Telehealth: Payer: Self-pay | Admitting: Cardiovascular Disease

## 2017-05-12 DIAGNOSIS — N179 Acute kidney failure, unspecified: Secondary | ICD-10-CM | POA: Diagnosis not present

## 2017-05-12 NOTE — Telephone Encounter (Signed)
Claiborne Billings calling for verbal orders for [facility] for Physical Therapy.

## 2017-05-12 NOTE — Telephone Encounter (Signed)
Requested Claiborne Billings with PT to call patient's PCP. Will forward to Dr. Johnsie Cancel for further advisement.

## 2017-05-12 NOTE — Telephone Encounter (Signed)
Ok to give verbal orders her primary Claris Gower would not do in hospital

## 2017-05-12 NOTE — Telephone Encounter (Signed)
Left detailed message with Claiborne Billings with [facility] that Dr. Johnsie Cancel is okay with verbal order. Requested Claiborne Billings to call back if she has any other questions.

## 2017-05-14 LAB — CULTURE, BLOOD (ROUTINE X 2)
CULTURE: NO GROWTH
Culture: NO GROWTH
SPECIAL REQUESTS: ADEQUATE
Special Requests: ADEQUATE

## 2017-06-02 ENCOUNTER — Emergency Department (HOSPITAL_COMMUNITY)
Admission: EM | Admit: 2017-06-02 | Discharge: 2017-06-02 | Disposition: A | Discharge status: Home / Self Care | Payer: Medicare Other | Attending: Emergency Medicine | Admitting: Emergency Medicine

## 2017-06-02 ENCOUNTER — Encounter (HOSPITAL_COMMUNITY): Payer: Self-pay | Admitting: Emergency Medicine

## 2017-06-02 DIAGNOSIS — R112 Nausea with vomiting, unspecified: Secondary | ICD-10-CM

## 2017-06-02 DIAGNOSIS — R197 Diarrhea, unspecified: Secondary | ICD-10-CM | POA: Insufficient documentation

## 2017-06-02 DIAGNOSIS — Z7982 Long term (current) use of aspirin: Secondary | ICD-10-CM | POA: Insufficient documentation

## 2017-06-02 DIAGNOSIS — Z87891 Personal history of nicotine dependence: Secondary | ICD-10-CM | POA: Insufficient documentation

## 2017-06-02 DIAGNOSIS — I2510 Atherosclerotic heart disease of native coronary artery without angina pectoris: Secondary | ICD-10-CM | POA: Insufficient documentation

## 2017-06-02 DIAGNOSIS — Z79899 Other long term (current) drug therapy: Secondary | ICD-10-CM | POA: Insufficient documentation

## 2017-06-02 DIAGNOSIS — I10 Essential (primary) hypertension: Secondary | ICD-10-CM | POA: Diagnosis not present

## 2017-06-02 LAB — COMPREHENSIVE METABOLIC PANEL
ALT: 13 U/L — AB (ref 14–54)
AST: 25 U/L (ref 15–41)
Albumin: 3.5 g/dL (ref 3.5–5.0)
Alkaline Phosphatase: 91 U/L (ref 38–126)
Anion gap: 12 (ref 5–15)
BUN: 27 mg/dL — ABNORMAL HIGH (ref 6–20)
CHLORIDE: 99 mmol/L — AB (ref 101–111)
CO2: 25 mmol/L (ref 22–32)
CREATININE: 1.35 mg/dL — AB (ref 0.44–1.00)
Calcium: 9.1 mg/dL (ref 8.9–10.3)
GFR calc non Af Amer: 36 mL/min — ABNORMAL LOW (ref >60)
GFR, EST AFRICAN AMERICAN: 42 mL/min — AB (ref >60)
GLUCOSE: 143 mg/dL — AB (ref 65–99)
Potassium: 3.6 mmol/L (ref 3.5–5.1)
Sodium: 136 mmol/L (ref 135–145)
Total Bilirubin: 0.4 mg/dL (ref 0.3–1.2)
Total Protein: 7.9 g/dL (ref 6.5–8.1)

## 2017-06-02 LAB — CBC
HCT: 43.6 % (ref 36.0–46.0)
Hemoglobin: 14.6 g/dL (ref 12.0–15.0)
MCH: 28.7 pg (ref 26.0–34.0)
MCHC: 33.5 g/dL (ref 30.0–36.0)
MCV: 85.8 fL (ref 78.0–100.0)
Platelets: 338 10*3/uL (ref 150–400)
RBC: 5.08 MIL/uL (ref 3.87–5.11)
RDW: 13.3 % (ref 11.5–15.5)
WBC: 29.8 10*3/uL — AB (ref 4.0–10.5)

## 2017-06-02 LAB — I-STAT CG4 LACTIC ACID, ED
Lactic Acid, Venous: 2.04 mmol/L (ref 0.5–1.9)
Lactic Acid, Venous: 1.37 mmol/L (ref 0.5–1.9)

## 2017-06-02 LAB — URINALYSIS, ROUTINE W REFLEX MICROSCOPIC
BILIRUBIN URINE: NEGATIVE
GLUCOSE, UA: NEGATIVE mg/dL
Ketones, ur: NEGATIVE mg/dL
NITRITE: NEGATIVE
PH: 6 (ref 5.0–8.0)
Protein, ur: NEGATIVE mg/dL
SPECIFIC GRAVITY, URINE: 1.01 (ref 1.005–1.030)

## 2017-06-02 LAB — LIPASE, BLOOD: LIPASE: 34 U/L (ref 11–51)

## 2017-06-02 MED ORDER — ONDANSETRON 4 MG PO TBDP: 4.0000 mg | ORAL_TABLET | Freq: Three times a day (TID) | ORAL | 0 refills | Status: DC | PRN

## 2017-06-02 MED ORDER — SODIUM CHLORIDE 0.9 % IV BOLUS (SEPSIS)
500.0000 mL | Freq: Once | INTRAVENOUS | Status: AC
  Administered 2017-06-02: 500 mL via INTRAVENOUS

## 2017-06-02 NOTE — Discharge Instructions (Signed)
Return to the ED with any concerns including vomiting and not able to keep down liquids or your medications, abdominal pain especially if it localizes to the right lower abdomen, fever or chills, and decreased urine output, decreased level of alertness or lethargy, or any other alarming symptoms.  °

## 2017-06-02 NOTE — ED Provider Notes (Signed)
[facility] DEPT Provider Note   CSN: [id number] Arrival date & time: 06/02/17  1239     History   Chief Complaint Chief Complaint  Patient presents with  . Weakness  . Emesis  . Diarrhea    HPI [name] [name] is a 83 y.o. female.  She is here with her husband who states that she has been vomiting for 2 days, and not eaten anything since that time.  She saw her PCP, 4 days ago at which time she was started on a new antibiotic for urinary tract infection.  Today the PCP contacted the patient's husband and advised that she come to the emergency department.  She has had a persistently elevated white blood cell count.  She is reported to also have elevation of her creatinine level.  There is been no recent cough, chest or back pain, or inability to walk.  Patient cannot give history.  Level 5 caveat-confusion  HPI  Past Medical History:  Diagnosis Date  . Anxiety    pt denies  . Arthritis    back, legs  . CAD (coronary artery disease) 2008   s/p Taxus Element Perseus Study stent (DES) to OM1 in 08/2006; EF 65%  . HTN (hypertension)   . Hypercholesterolemia   . Pneumonia 5/16   hospitalized for 4 days    Patient Active Problem List   Diagnosis Date Noted  . Dehydration 05/08/2017  . Nausea & vomiting 05/08/2017  . Leukocytosis   . CAP (community acquired pneumonia) 08/23/2014  . Leg pain 09/15/2011  . Carotid bruit 09/15/2011  . Lung nodule 08/16/2011  . HYPERCHOLESTEROLEMIA 01/07/2010  . ANXIETY 01/07/2010  . Essential hypertension 01/07/2010  . CAD 01/07/2010    Past Surgical History:  Procedure Laterality Date  . APPENDECTOMY  1950  . CORONARY ANGIOPLASTY WITH STENT PLACEMENT    . GANGLION CYST EXCISION Right 12/26/2014   Procedure: EXCISON OF RIGHT FOOT GANGLION CYST;  Surgeon: Wylene Simmer, MD;  Location: [facility];  Service: Orthopedics;  Laterality: Right;  . lower back surgery  1976  . NECK SURGERY  1990  .  PARTIAL HYSTERECTOMY  1977  . PCI of hte lesion in th marginal branch circumflex artery      OB History    No data available       Home Medications    Prior to Admission medications   Medication Sig Start Date End Date Taking? Authorizing Provider  aspirin 81 MG tablet Take 81 mg by mouth daily.     Yes [provider]  felodipine (PLENDIL) 10 MG 24 hr tablet Take 10 mg by mouth daily.     Yes [provider]  Multiple Vitamin (MULTIVITAMIN WITH MINERALS) TABS tablet Take 1 tablet by mouth daily. 05/11/17  Yes Georgette Shell, MD  ondansetron (ZOFRAN) 8 MG tablet Take 8 mg by mouth every 8 (eight) hours as needed for nausea/vomiting. 06/01/17  Yes [provider]  sulfamethoxazole-trimethoprim (BACTRIM DS,SEPTRA DS) 800-160 MG tablet Take 1 tablet by mouth 2 (two) times daily. 05/30/17  Yes [provider]  cefpodoxime (VANTIN) 100 MG tablet Take 1 tablet (100 mg total) by mouth daily. Patient not taking: Reported on 06/02/2017 05/10/17   Georgette Shell, MD  nitroGLYCERIN (NITROSTAT) 0.4 MG SL tablet Place 1 tablet (0.4 mg total) under the tongue every 5 (five) minutes as needed for chest pain. 07/10/14   Josue Hector, MD    Family History Family History  Problem Relation Age of Onset  . Heart failure Mother   . Hypertension Mother     Social History Social History   Tobacco Use  . Smoking status: Former Smoker    Packs/day: 1.00    Years: 40.00    Pack years: 40.00    Types: Cigarettes    Last attempt to quit: 06/13/2011    Years since quitting: 5.9  . Smokeless tobacco: Never Used  Substance Use Topics  . Alcohol use: No  . Drug use: No     Allergies   Patient has no known allergies.   Review of Systems Review of Systems  Unable to perform ROS: Mental status change     Physical Exam Updated Vital Signs BP (!) 151/58 (BP Location: Right Arm)   Pulse (!) 105   Temp 99.2 F (37.3 C) (Rectal)   Resp 16   SpO2 94%     Physical Exam  Constitutional: She appears well-developed. No distress.  Elderly, frail  HENT:  Head: Normocephalic and atraumatic.  Eyes: Conjunctivae and EOM are normal. Pupils are equal, round, and reactive to light.  Neck: Normal range of motion and phonation normal. Neck supple.  Cardiovascular: Normal rate and regular rhythm.  Pulmonary/Chest: Effort normal and breath sounds normal. She exhibits no tenderness.  Abdominal: Soft. She exhibits no distension. There is no tenderness. There is no guarding.  Musculoskeletal: Normal range of motion.  Neurological: She is alert. She exhibits normal muscle tone.  Skin: Skin is warm and dry.  Psychiatric: She has a normal mood and affect. Her behavior is normal.  Nursing note and vitals reviewed.    ED Treatments / Results  Labs (all labs ordered are listed, but only abnormal results are displayed) Labs Reviewed  COMPREHENSIVE METABOLIC PANEL - Abnormal; Notable for the following components:      Result Value   Chloride 99 (*)    Glucose, Bld 143 (*)    BUN 27 (*)    Creatinine, Ser 1.35 (*)    ALT 13 (*)    GFR calc non Af Amer 36 (*)    GFR calc Af Amer 42 (*)    All other components within normal limits  CBC - Abnormal; Notable for the following components:   WBC 29.8 (*)    All other components within normal limits  URINALYSIS, ROUTINE W REFLEX MICROSCOPIC - Abnormal; Notable for the following components:   Hgb urine dipstick SMALL (*)    Leukocytes, UA TRACE (*)    Bacteria, UA RARE (*)    Squamous Epithelial / LPF 0-5 (*)    All other components within normal limits  I-STAT CG4 LACTIC ACID, ED - Abnormal; Notable for the following components:   Lactic Acid, Venous 2.04 (*)    All other components within normal limits  LIPASE, BLOOD  I-STAT CG4 LACTIC ACID, ED   BUN  Date Value Ref Range Status  06/02/2017 27 (H) 6 - 20 mg/dL Final  05/10/2017 18 6 - 20 mg/dL Final  05/09/2017 20 6 - 20 mg/dL Final  05/08/2017  21 (H) 6 - 20 mg/dL Final   Creatinine, Ser  Date Value Ref Range Status  06/02/2017 1.35 (H) 0.44 - 1.00 mg/dL Final  05/10/2017 1.25 (H) 0.44 - 1.00 mg/dL Final  05/09/2017 1.32 (H) 0.44 - 1.00 mg/dL Final  05/08/2017 1.43 (H) 0.44 - 1.00 mg/dL Final   WBC  Date Value Ref Range Status  06/02/2017 29.8 (H) 4.0 - 10.5 K/uL Final  05/10/2017 13.4 (H) 4.0 - 10.5 K/uL Final  05/09/2017 19.1 (H) 4.0 - 10.5 K/uL Final  05/08/2017 30.7 (H) 4.0 - 10.5 K/uL Final   .  EKG  EKG Interpretation None       Radiology No results found.  Procedures Procedures (including critical care time)  Medications Ordered in ED Medications  sodium chloride 0.9 % bolus 500 mL (500 mLs Intravenous New Bag/Given 06/02/17 1615)     Initial Impression / Assessment and Plan / ED Course  I have reviewed the triage vital signs and the nursing notes.  Pertinent labs & imaging results that were available during my care of the patient were reviewed by me and considered in my medical decision making (see chart for details).      Patient Vitals for the past 24 hrs:  BP Temp Temp src Pulse Resp SpO2  06/02/17 1609 - 99.2 F (37.3 C) Rectal - - -  06/02/17 1252 (!) 151/58 (!) 97.5 F (36.4 C) Oral (!) 105 16 94 %    5:00 PM Reevaluation with update and discussion. After initial assessment and treatment, an updated evaluation reveals patient is comfortable at this time, she is receiving a second IV fluid bolus.  Patient's husband has talked to her physician in the last few minutes, on the phone, and he was told that the patient was on an antibiotic which was appropriate, for the urine culture which apparently showed E. Coli.  Current findings discussed and current questions answered. Daleen Bo     Final Clinical Impressions(s) / ED Diagnoses   Final diagnoses:  Nausea and vomiting, intractability of vomiting not specified, unspecified vomiting type   Patient presenting for evaluation of nausea  and vomiting, with suspected UTI.  Urinalysis does not indicate infection at this time.  She was admitted, on 05/08/17, at that time had markedly elevated white blood cell count 30,000.  She was treated empirically with parenteral antibiotics, and discharged on oral Vantin.  Trending white blood cell count over the last 4 weeks shows persistent elevation, differential done, earlier this month did not indicate abnormal morphology.  Suspect antibiotic intolerance causing nausea and vomiting.  Doubt severe sepsis, metabolic instability or impending vascular collapse.   Nursing Notes Reviewed/ Care Coordinated Applicable Imaging Reviewed Interpretation of Laboratory Data incorporated into ED treatment   Plan-evaluate after additional IV fluids, and oral attrition trial.  I anticipate that the patient can be discharged, if tolerating oral fluids, and has improvement of mild lactate elevation.  She would need close follow-up with her PCP, in 3 or 4 days.    ED Discharge Orders    None       Daleen Bo, MD 06/02/17 [phone]

## 2017-06-02 NOTE — ED Triage Notes (Signed)
Patient BIB husband, reports patient was sent by PCP, Dr. Welton Flakes, for evaluation. Patient reports admission for UTI at the beginning of February and reports N/V/D and generalized weakness since time of d/c. Reports she has a poor appetite and is only able to drink water. Denies chest pain and SOB.

## 2017-06-02 NOTE — ED Notes (Signed)
Patient has had saltines and water and tolerating well.

## 2017-06-18 ENCOUNTER — Emergency Department (HOSPITAL_COMMUNITY): Payer: Medicare Other

## 2017-06-18 ENCOUNTER — Inpatient Hospital Stay (HOSPITAL_COMMUNITY)
Admission: EM | Admit: 2017-06-18 | Discharge: 2017-06-21 | DRG: 065 | Disposition: A | Discharge status: Skilled Nursing Facility | Payer: Medicare Other | Attending: Internal Medicine | Admitting: Internal Medicine

## 2017-06-18 ENCOUNTER — Other Ambulatory Visit: Payer: Self-pay

## 2017-06-18 ENCOUNTER — Encounter (HOSPITAL_COMMUNITY): Payer: Self-pay

## 2017-06-18 DIAGNOSIS — R29706 NIHSS score 6: Secondary | ICD-10-CM | POA: Diagnosis present

## 2017-06-18 DIAGNOSIS — Z8249 Family history of ischemic heart disease and other diseases of the circulatory system: Secondary | ICD-10-CM | POA: Diagnosis not present

## 2017-06-18 DIAGNOSIS — Z79899 Other long term (current) drug therapy: Secondary | ICD-10-CM

## 2017-06-18 DIAGNOSIS — R531 Weakness: Secondary | ICD-10-CM

## 2017-06-18 DIAGNOSIS — R918 Other nonspecific abnormal finding of lung field: Secondary | ICD-10-CM | POA: Diagnosis present

## 2017-06-18 DIAGNOSIS — R1310 Dysphagia, unspecified: Secondary | ICD-10-CM | POA: Diagnosis present

## 2017-06-18 DIAGNOSIS — I671 Cerebral aneurysm, nonruptured: Secondary | ICD-10-CM | POA: Diagnosis present

## 2017-06-18 DIAGNOSIS — E785 Hyperlipidemia, unspecified: Secondary | ICD-10-CM | POA: Diagnosis present

## 2017-06-18 DIAGNOSIS — Z90711 Acquired absence of uterus with remaining cervical stump: Secondary | ICD-10-CM

## 2017-06-18 DIAGNOSIS — I10 Essential (primary) hypertension: Secondary | ICD-10-CM | POA: Diagnosis present

## 2017-06-18 DIAGNOSIS — Z87440 Personal history of urinary (tract) infections: Secondary | ICD-10-CM | POA: Diagnosis not present

## 2017-06-18 DIAGNOSIS — I6340 Cerebral infarction due to embolism of unspecified cerebral artery: Secondary | ICD-10-CM | POA: Diagnosis present

## 2017-06-18 DIAGNOSIS — Z8673 Personal history of transient ischemic attack (TIA), and cerebral infarction without residual deficits: Secondary | ICD-10-CM

## 2017-06-18 DIAGNOSIS — Z7982 Long term (current) use of aspirin: Secondary | ICD-10-CM | POA: Diagnosis not present

## 2017-06-18 DIAGNOSIS — I2510 Atherosclerotic heart disease of native coronary artery without angina pectoris: Secondary | ICD-10-CM | POA: Diagnosis present

## 2017-06-18 DIAGNOSIS — I6310 Cerebral infarction due to embolism of unspecified precerebral artery: Secondary | ICD-10-CM | POA: Diagnosis not present

## 2017-06-18 DIAGNOSIS — E876 Hypokalemia: Secondary | ICD-10-CM | POA: Diagnosis present

## 2017-06-18 DIAGNOSIS — Z955 Presence of coronary angioplasty implant and graft: Secondary | ICD-10-CM | POA: Diagnosis not present

## 2017-06-18 DIAGNOSIS — R29702 NIHSS score 2: Secondary | ICD-10-CM | POA: Diagnosis not present

## 2017-06-18 DIAGNOSIS — Z87891 Personal history of nicotine dependence: Secondary | ICD-10-CM | POA: Diagnosis not present

## 2017-06-18 DIAGNOSIS — J449 Chronic obstructive pulmonary disease, unspecified: Secondary | ICD-10-CM | POA: Diagnosis present

## 2017-06-18 DIAGNOSIS — M1991 Primary osteoarthritis, unspecified site: Secondary | ICD-10-CM | POA: Diagnosis present

## 2017-06-18 DIAGNOSIS — I472 Ventricular tachycardia: Secondary | ICD-10-CM

## 2017-06-18 DIAGNOSIS — I639 Cerebral infarction, unspecified: Secondary | ICD-10-CM | POA: Diagnosis present

## 2017-06-18 DIAGNOSIS — G8194 Hemiplegia, unspecified affecting left nondominant side: Secondary | ICD-10-CM | POA: Diagnosis present

## 2017-06-18 DIAGNOSIS — R29701 NIHSS score 1: Secondary | ICD-10-CM | POA: Diagnosis not present

## 2017-06-18 DIAGNOSIS — M479 Spondylosis, unspecified: Secondary | ICD-10-CM | POA: Diagnosis present

## 2017-06-18 DIAGNOSIS — I4729 Other ventricular tachycardia: Secondary | ICD-10-CM

## 2017-06-18 DIAGNOSIS — Z8701 Personal history of pneumonia (recurrent): Secondary | ICD-10-CM

## 2017-06-18 DIAGNOSIS — R29703 NIHSS score 3: Secondary | ICD-10-CM | POA: Diagnosis present

## 2017-06-18 DIAGNOSIS — I728 Aneurysm of other specified arteries: Secondary | ICD-10-CM | POA: Diagnosis present

## 2017-06-18 LAB — DIFFERENTIAL
BASOS ABS: 0 10*3/uL (ref 0.0–0.1)
Basophils Relative: 0 %
EOS PCT: 2 %
Eosinophils Absolute: 0.2 10*3/uL (ref 0.0–0.7)
LYMPHS PCT: 27 %
Lymphs Abs: 2.6 10*3/uL (ref 0.7–4.0)
Monocytes Absolute: 1.6 10*3/uL — ABNORMAL HIGH (ref 0.1–1.0)
Monocytes Relative: 17 %
NEUTROS PCT: 54 %
Neutro Abs: 5.1 10*3/uL (ref 1.7–7.7)

## 2017-06-18 LAB — COMPREHENSIVE METABOLIC PANEL
ALBUMIN: 2.8 g/dL — AB (ref 3.5–5.0)
ALT: 12 U/L — AB (ref 14–54)
ANION GAP: 10 (ref 5–15)
AST: 23 U/L (ref 15–41)
Alkaline Phosphatase: 61 U/L (ref 38–126)
BILIRUBIN TOTAL: 0.6 mg/dL (ref 0.3–1.2)
BUN: 5 mg/dL — AB (ref 6–20)
CALCIUM: 8.3 mg/dL — AB (ref 8.9–10.3)
CO2: 25 mmol/L (ref 22–32)
CREATININE: 0.83 mg/dL (ref 0.44–1.00)
Chloride: 103 mmol/L (ref 101–111)
GFR calc Af Amer: >60 mL/min (ref >60)
GFR calc non Af Amer: >60 mL/min (ref >60)
GLUCOSE: 108 mg/dL — AB (ref 65–99)
Potassium: 2.8 mmol/L — ABNORMAL LOW (ref 3.5–5.1)
SODIUM: 138 mmol/L (ref 135–145)
Total Protein: 6 g/dL — ABNORMAL LOW (ref 6.5–8.1)

## 2017-06-18 LAB — URINALYSIS, ROUTINE W REFLEX MICROSCOPIC
Bilirubin Urine: NEGATIVE
Glucose, UA: NEGATIVE mg/dL
Hgb urine dipstick: NEGATIVE
KETONES UR: NEGATIVE mg/dL
LEUKOCYTES UA: NEGATIVE
NITRITE: NEGATIVE
PH: 7 (ref 5.0–8.0)
Protein, ur: NEGATIVE mg/dL
SPECIFIC GRAVITY, URINE: 1.019 (ref 1.005–1.030)

## 2017-06-18 LAB — LIPID PANEL
CHOL/HDL RATIO: 3.5 ratio
CHOLESTEROL: 182 mg/dL (ref 0–200)
HDL: 52 mg/dL (ref >40)
LDL CALC: 109 mg/dL — AB (ref 0–99)
Triglycerides: 105 mg/dL (ref <150)
VLDL: 21 mg/dL (ref 0–40)

## 2017-06-18 LAB — I-STAT CHEM 8, ED
BUN: 3 mg/dL — AB (ref 6–20)
CALCIUM ION: 1.07 mmol/L — AB (ref 1.15–1.40)
CHLORIDE: 103 mmol/L (ref 101–111)
CREATININE: 0.7 mg/dL (ref 0.44–1.00)
Glucose, Bld: 103 mg/dL — ABNORMAL HIGH (ref 65–99)
HCT: 36 % (ref 36.0–46.0)
Hemoglobin: 12.2 g/dL (ref 12.0–15.0)
Potassium: 2.9 mmol/L — ABNORMAL LOW (ref 3.5–5.1)
SODIUM: 142 mmol/L (ref 135–145)
TCO2: 26 mmol/L (ref 22–32)

## 2017-06-18 LAB — I-STAT TROPONIN, ED: Troponin i, poc: 0.04 ng/mL (ref 0.00–0.08)

## 2017-06-18 LAB — PROTIME-INR
INR: 1.04
Prothrombin Time: 13.5 seconds (ref 11.4–15.2)

## 2017-06-18 LAB — CBC
HEMATOCRIT: 36.8 % (ref 36.0–46.0)
HEMOGLOBIN: 12 g/dL (ref 12.0–15.0)
MCH: 28.1 pg (ref 26.0–34.0)
MCHC: 32.6 g/dL (ref 30.0–36.0)
MCV: 86.2 fL (ref 78.0–100.0)
Platelets: 429 10*3/uL — ABNORMAL HIGH (ref 150–400)
RBC: 4.27 MIL/uL (ref 3.87–5.11)
RDW: 13.9 % (ref 11.5–15.5)
WBC: 9.5 10*3/uL (ref 4.0–10.5)

## 2017-06-18 LAB — HEMOGLOBIN A1C
Hgb A1c MFr Bld: 5.4 % (ref 4.8–5.6)
MEAN PLASMA GLUCOSE: 108.28 mg/dL

## 2017-06-18 LAB — APTT: APTT: 30 s (ref 24–36)

## 2017-06-18 LAB — CBG MONITORING, ED: GLUCOSE-CAPILLARY: 104 mg/dL — AB (ref 65–99)

## 2017-06-18 LAB — MAGNESIUM: Magnesium: 1.8 mg/dL (ref 1.7–2.4)

## 2017-06-18 MED ORDER — ACETAMINOPHEN 160 MG/5ML PO SOLN: 650.0000 mg | ORAL | Status: DC | PRN

## 2017-06-18 MED ORDER — SENNOSIDES-DOCUSATE SODIUM 8.6-50 MG PO TABS
1.0000 | ORAL_TABLET | Freq: Every evening | ORAL | Status: DC | PRN
Start: 2017-06-18 — End: 2017-06-21

## 2017-06-18 MED ORDER — POTASSIUM CHLORIDE CRYS ER 20 MEQ PO TBCR
40.0000 meq | EXTENDED_RELEASE_TABLET | Freq: Once | ORAL | Status: AC
  Administered 2017-06-18: 40 meq via ORAL
  Filled 2017-06-18: qty 2

## 2017-06-18 MED ORDER — ASPIRIN EC 81 MG PO TBEC
81.0000 mg | DELAYED_RELEASE_TABLET | Freq: Every day | ORAL | Status: DC
  Administered 2017-06-18: 81 mg via ORAL
  Filled 2017-06-18 (×4): qty 1

## 2017-06-18 MED ORDER — ACETAMINOPHEN 325 MG PO TABS: 650.0000 mg | ORAL_TABLET | ORAL | Status: DC | PRN

## 2017-06-18 MED ORDER — INSULIN ASPART 100 UNIT/ML ~~LOC~~ SOLN: 0.0000 [IU] | SUBCUTANEOUS | Status: DC

## 2017-06-18 MED ORDER — STROKE: EARLY STAGES OF RECOVERY BOOK
Freq: Once | Status: AC
  Administered 2017-06-19: 07:00:00
  Filled 2017-06-18: qty 1

## 2017-06-18 MED ORDER — ADULT MULTIVITAMIN W/MINERALS CH
1.0000 | ORAL_TABLET | Freq: Every day | ORAL | Status: DC
  Administered 2017-06-19 – 2017-06-21 (×3): 1 via ORAL
  Filled 2017-06-18 (×3): qty 1

## 2017-06-18 MED ORDER — IOPAMIDOL (ISOVUE-300) INJECTION 61%
INTRAVENOUS | Status: AC
  Administered 2017-06-18: 75 mL
  Filled 2017-06-18: qty 75

## 2017-06-18 MED ORDER — MAGNESIUM SULFATE 2 GM/50ML IV SOLN
2.0000 g | Freq: Once | INTRAVENOUS | Status: AC
  Administered 2017-06-19: 2 g via INTRAVENOUS
  Filled 2017-06-18: qty 50

## 2017-06-18 MED ORDER — GADOBENATE DIMEGLUMINE 529 MG/ML IV SOLN
10.0000 mL | Freq: Once | INTRAVENOUS | Status: AC | PRN
  Administered 2017-06-18: 10 mL via INTRAVENOUS

## 2017-06-18 MED ORDER — POTASSIUM CHLORIDE 10 MEQ/100ML IV SOLN
10.0000 meq | INTRAVENOUS | Status: DC
  Filled 2017-06-18 (×2): qty 100

## 2017-06-18 MED ORDER — ASPIRIN EC 81 MG PO TBEC
81.0000 mg | DELAYED_RELEASE_TABLET | Freq: Every day | ORAL | Status: DC
  Administered 2017-06-19 – 2017-06-21 (×3): 81 mg via ORAL

## 2017-06-18 MED ORDER — ATORVASTATIN CALCIUM 10 MG PO TABS
20.0000 mg | ORAL_TABLET | Freq: Every day | ORAL | Status: DC
  Administered 2017-06-19 – 2017-06-20 (×2): 20 mg via ORAL
  Filled 2017-06-18 (×2): qty 2

## 2017-06-18 MED ORDER — ACETAMINOPHEN 650 MG RE SUPP: 650.0000 mg | RECTAL | Status: DC | PRN

## 2017-06-18 NOTE — ED Provider Notes (Signed)
[facility] EMERGENCY DEPARTMENT Provider Note   CSN: [id number] Arrival date & time: 06/18/17  1337     History   Chief Complaint Chief Complaint  Patient presents with  . Weakness    HPI [name] [name] is a 83 y.o. female.  The history is provided by the patient.  Weakness  Primary symptoms include focal weakness (L sided x3hr yesterday around 1pm).  Primary symptoms include no dizziness. This is a new problem. The current episode started yesterday. The problem has been resolved. There was left upper extremity and left lower extremity focality noted. There has been no fever. Pertinent negatives include no shortness of breath, no chest pain, no vomiting, no altered mental status, no confusion and no headaches. Associated medical issues do not include trauma or CVA.  -Patient was treated for a UTI with Vantin and Bactrim this past month but she states she has not felt any better and complains of generalized fatigue and generalized weakness.  Husband states she has also lost about 10 pounds over the last 5 weeks.  She denies any fevers or chills.  She states the dysuria that she had prior had resolved.  She had some vomiting and diarrhea with the Bactrim but that has since resolved.  She denies any abdominal pain, chest pain, shortness of breath, cough. -Yesterday when standing up after using the toilet, she felt weak all over and lowered herself to the ground and did not sustain any trauma.  Her husband helped her walk out of the bathroom around 1 PM with a walker and noted that she was leaning to the left and seem like the left side of her body was weak.  Past Medical History:  Diagnosis Date  . Anxiety    pt denies  . Arthritis    back, legs  . CAD (coronary artery disease) 2008   s/p Taxus Element Perseus Study stent (DES) to OM1 in 08/2006; EF 65%  . HTN (hypertension)   . Hypercholesterolemia   . Pneumonia 5/16   hospitalized for 4 days    Patient Active  Problem List   Diagnosis Date Noted  . Acute CVA (cerebrovascular accident) ([place]) 06/18/2017  . Hypokalemia 06/18/2017  . Ischemic stroke ([place]) 06/18/2017  . Dehydration 05/08/2017  . Nausea & vomiting 05/08/2017  . Leukocytosis   . CAP (community acquired pneumonia) 08/23/2014  . Leg pain 09/15/2011  . Carotid bruit 09/15/2011  . Mass of lower lobe of left lung 08/16/2011  . HYPERCHOLESTEROLEMIA 01/07/2010  . ANXIETY 01/07/2010  . Essential hypertension 01/07/2010  . CAD 01/07/2010    Past Surgical History:  Procedure Laterality Date  . APPENDECTOMY  1950  . CORONARY ANGIOPLASTY WITH STENT PLACEMENT    . GANGLION CYST EXCISION Right 12/26/2014   Procedure: EXCISON OF RIGHT FOOT GANGLION CYST;  Surgeon: Wylene Simmer, MD;  Location: [facility];  Service: Orthopedics;  Laterality: Right;  . lower back surgery  1976  . NECK SURGERY  1990  . PARTIAL HYSTERECTOMY  1977  . PCI of hte lesion in th marginal branch circumflex artery      OB History    No data available       Home Medications    Prior to Admission medications   Medication Sig Start Date End Date Taking? Authorizing Provider  aspirin 81 MG tablet Take 81 mg by mouth 2 (two) times a week.    Yes [provider]  EPINEPHrine (EPIPEN 2-PAK) 0.3 mg/0.3 mL IJ SOAJ  injection Inject 0.3 mg into the muscle once as needed (for anaphylaxis).   Yes [provider]  felodipine (PLENDIL) 10 MG 24 hr tablet Take 10 mg by mouth daily.     Yes [provider]  Multiple Vitamin (MULTIVITAMIN WITH MINERALS) TABS tablet Take 1 tablet by mouth daily. 05/11/17  Yes Georgette Shell, MD  nitroGLYCERIN (NITROSTAT) 0.4 MG SL tablet Place 1 tablet (0.4 mg total) under the tongue every 5 (five) minutes as needed for chest pain. 07/10/14  Yes Josue Hector, MD  ondansetron (ZOFRAN ODT) 4 MG disintegrating tablet Take 1 tablet (4 mg total) by mouth every 8 (eight) hours as needed for nausea or  vomiting. Patient not taking: Reported on 06/18/2017 06/02/17   Pixie Casino, MD    Family History Family History  Problem Relation Age of Onset  . Heart failure Mother   . Hypertension Mother     Social History Social History   Tobacco Use  . Smoking status: Former Smoker    Packs/day: 1.00    Years: 40.00    Pack years: 40.00    Types: Cigarettes    Last attempt to quit: 06/13/2011    Years since quitting: 6.0  . Smokeless tobacco: Never Used  Substance Use Topics  . Alcohol use: No  . Drug use: No     Allergies   Other   Review of Systems Review of Systems  Constitutional: Positive for appetite change, fatigue and unexpected weight change. Negative for chills and fever.  HENT: Negative for ear pain and sore throat.   Eyes: Negative for visual disturbance.  Respiratory: Negative for cough and shortness of breath.   Cardiovascular: Negative for chest pain, palpitations and leg swelling.  Gastrointestinal: Negative for abdominal distention, abdominal pain, blood in stool, diarrhea, nausea and vomiting.  Genitourinary: Negative for dysuria, flank pain and hematuria.  Musculoskeletal: Negative for back pain.  Skin: Negative for color change and rash.  Neurological: Positive for focal weakness (L sided x3hr yesterday around 1pm) and weakness. Negative for dizziness, syncope, facial asymmetry, speech difficulty, light-headedness, numbness and headaches.  Psychiatric/Behavioral: Negative for confusion.  All other systems reviewed and are negative.    Physical Exam Updated Vital Signs BP (!) 149/64   Pulse 78   Temp 98.1 F (36.7 C) (Oral)   Resp 17   Wt 52.6 kg (116 lb)   SpO2 93%   BMI 20.55 kg/m   Physical Exam  Constitutional: She is oriented to person, place, and time. She appears well-developed and well-nourished. She does not appear ill. No distress.  thin  HENT:  Head: Normocephalic and atraumatic.  Eyes: Conjunctivae and EOM are normal. Pupils are  equal, round, and reactive to light.  Neck: Neck supple.  Cardiovascular: Normal rate, regular rhythm and intact distal pulses.  No murmur heard. Pulmonary/Chest: Effort normal and breath sounds normal. No respiratory distress. She has no wheezes. She has no rales.  Abdominal: Soft. She exhibits no distension. There is no tenderness. There is no guarding.  Musculoskeletal: She exhibits no edema or deformity.  Neurological: She is alert and oriented to person, place, and time. She has normal strength. She displays no tremor. No cranial nerve deficit or sensory deficit. She exhibits normal muscle tone. Coordination (L dysmetria) abnormal. Abnormal gait: could not test gait. GCS eye subscore is 4. GCS verbal subscore is 5. GCS motor subscore is 6.  L mild pronator drift  Skin: Skin is warm and dry.  Psychiatric: She  has a normal mood and affect.  Nursing note and vitals reviewed.    ED Treatments / Results  Labs (all labs ordered are listed, but only abnormal results are displayed) Labs Reviewed  URINALYSIS, ROUTINE W REFLEX MICROSCOPIC - Abnormal; Notable for the following components:      Result Value   Color, Urine STRAW (*)    All other components within normal limits  CBC - Abnormal; Notable for the following components:   Platelets 429 (*)    All other components within normal limits  DIFFERENTIAL - Abnormal; Notable for the following components:   Monocytes Absolute 1.6 (*)    All other components within normal limits  COMPREHENSIVE METABOLIC PANEL - Abnormal; Notable for the following components:   Potassium 2.8 (*)    Glucose, Bld 108 (*)    BUN 5 (*)    Calcium 8.3 (*)    Total Protein 6.0 (*)    Albumin 2.8 (*)    ALT 12 (*)    All other components within normal limits  LIPID PANEL - Abnormal; Notable for the following components:   LDL Cholesterol 109 (*)    All other components within normal limits  CBG MONITORING, ED - Abnormal; Notable for the following  components:   Glucose-Capillary 104 (*)    All other components within normal limits  I-STAT CHEM 8, ED - Abnormal; Notable for the following components:   Potassium 2.9 (*)    BUN 3 (*)    Glucose, Bld 103 (*)    Calcium, Ion 1.07 (*)    All other components within normal limits  URINE CULTURE  PROTIME-INR  APTT  MAGNESIUM  HEMOGLOBIN A1C  LIPID PANEL  MAGNESIUM  BASIC METABOLIC PANEL  I-STAT TROPONIN, ED  CBG MONITORING, ED    EKG  EKG Interpretation None       Radiology Dg Chest 2 View  Result Date: 06/18/2017 CLINICAL DATA:  83 year old female with history of progressive weakness for 1 month. EXAM: CHEST - 2 VIEW COMPARISON:  Chest x-ray 05/08/2017. FINDINGS: Lung volumes are normal. In the lower left hemithorax there is a 3.0 x 2.6 cm mass-like opacity, which is not confidently identified on lateral projection. This appears intimately associated with the anterior aspect of the left fourth rib, and could simply reflect a healing rib fracture, however, the possibility of a primary pulmonary lesion is not excluded. Alternatively, this could reflect in rounded area of pneumonia. No other consolidative airspace disease noted. No pleural effusions. No evidence of pulmonary edema. Heart size is normal. Upper mediastinal contours are within normal limits. Aortic atherosclerosis. IMPRESSION: 1. New mass-like opacity projecting over the lower left hemithorax seen only on the frontal projection. This is of uncertain etiology and significance, and could be infectious/inflammatory or neoplastic, or could alternatively reflect a healing fracture of the anterior aspect of the left fourth rib. Further evaluation with chest CT (preferably with IV contrast) is recommended in the near future to better evaluate this finding. 2. Aortic atherosclerosis. Electronically Signed   By: Vinnie Langton M.D.   On: 06/18/2017 16:41   Ct Head Wo Contrast  Result Date: 06/18/2017 CLINICAL DATA:  Complaints  of progressive weakness.  Recent UTI. EXAM: CT HEAD WITHOUT CONTRAST TECHNIQUE: Contiguous axial images were obtained from the base of the skull through the vertex without intravenous contrast. COMPARISON:  05/01/2016. FINDINGS: Brain: No evidence for acute infarction, hemorrhage, mass lesion, hydrocephalus, or extra-axial fluid. Generalized atrophy. Chronic microvascular ischemic change. Chronic RIGHT basal ganglia  lacunar infarct. Vascular: Calcification of the cavernous internal carotid arteries consistent with cerebrovascular atherosclerotic disease. No signs of intracranial large vessel occlusion. Skull: Normal. Negative for fracture or focal lesion. Sinuses/Orbits: No acute finding. Other: None. IMPRESSION: Chronic changes as described. Progressive atrophy since 2018. No acute intracranial findings. Electronically Signed   By: Staci Righter M.D.   On: 06/18/2017 15:17   Ct Chest W Contrast  Result Date: 06/18/2017 CLINICAL DATA:  83 year old female with history of abnormal chest x-ray suspicious for pulmonary mass. EXAM: CT CHEST WITH CONTRAST TECHNIQUE: Multidetector CT imaging of the chest was performed during intravenous contrast administration. CONTRAST:  75 mL ISOVUE-300 IOPAMIDOL (ISOVUE-300) INJECTION 61% COMPARISON:  Chest CT 08/19/2011. FINDINGS: Cardiovascular: Heart size is normal. There is no significant pericardial fluid, thickening or pericardial calcification. There is aortic atherosclerosis, as well as atherosclerosis of the great vessels of the mediastinum and the coronary arteries, including calcified atherosclerotic plaque in the left main, left anterior descending, left circumflex and right coronary arteries. Mediastinum/Nodes: No pathologically enlarged mediastinal or hilar lymph nodes. Esophagus is unremarkable in appearance. No axillary lymphadenopathy. Lungs/Pleura: New left lower lobe mass measuring 3.0 x 2.2 x 2.0 cm (coronal image 44 of series 5 and axial image 105 of series 7),  which has macrolobulated slightly spiculated margins, and is in contact with the right major fissure which is slightly retracted toward the lesion. Tiny right upper lobe calcified granuloma. A few scattered tiny 2-3 mm pulmonary nodules throughout the lungs bilaterally. No acute consolidative airspace disease. No pleural effusions. Diffuse bronchial wall thickening with mild centrilobular and paraseptal emphysema. Upper Abdomen: Irregular areas of hypoattenuation throughout the spleen, new compared to the prior examination, likely to reflect multifocal splenic infarctions. Small calcified granuloma in the liver. 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 Musculoskeletal: Chronic appearing compression fractures of T6 and L1, most severe at T6 where there is 30% loss of anterior vertebral body height. There are no aggressive appearing lytic or blastic lesions noted in the visualized portions of the skeleton. IMPRESSION: 1. The finding on the recent chest radiograph corresponds to a 3.0 x 2.2 x 2.0 cm left lower lobe mass which is highly aggressive in appearance, very likely to represent a primary bronchogenic neoplasm. This is intimately associated with the left major fissure, but there is no associated mediastinal or hilar lymphadenopathy noted at this time. Further evaluation with PET-CT is recommended in the near future for additional diagnostic and staging purposes. 2. Multiple tiny 2-3 mm pulmonary nodules scattered throughout the lungs bilaterally, nonspecific and statistically favored to represent tiny areas of mucoid impaction within terminal bronchioles. Attention on any future follow-up imaging is recommended to ensure stability or regression. 3. Diffuse bronchial wall thickening with mild centrilobular and paraseptal emphysema; imaging findings suggestive of underlying COPD. 4. Aortic atherosclerosis, in addition to left main and 3 vessel coronary artery disease. 5. Multiple splenic  infarcts. 6. 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 Aortic Atherosclerosis (ICD10-I70.0) and Emphysema (ICD10-J43.9). Electronically Signed   By: Vinnie Langton M.D.   On: 06/18/2017 20:26   Mr Jeri Cos And Wo Contrast  Result Date: 06/18/2017 CLINICAL DATA:  84 year old female with vomiting for 2 days. Left side weakness onset yesterday. Possible left lung mass. EXAM: MRI HEAD WITHOUT AND WITH CONTRAST TECHNIQUE: Multiplanar, multiecho pulse sequences of the brain and surrounding structures were obtained without and with intravenous contrast. CONTRAST:  38mL MULTIHANCE GADOBENATE DIMEGLUMINE 529 MG/ML IV SOLN COMPARISON:  Head CT without contrast 1429 hours today, 05/01/2016. FINDINGS: Brain: There are numerous small foci of restricted diffusion scattered in both cerebral and cerebellar hemispheres. The largest areas of involvement are in the posterior right cerebellum (series 3, image 8), left cerebellar peduncle (image 12), and posterior right insula (image 24). Bilateral anterior and posterior vascular territories are affected. The deep gray matter nuclei are spared. Mild associated T2 and FLAIR hyperintensity at the larger areas of involvement. No associated hemorrhage or mass effect. No associated abnormal enhancement. Underlying widespread and confluent bilateral cerebral white matter T2 and FLAIR hyperintensity. Underlying chronic lacunar infarcts of the right basal ganglia. No chronic cerebral blood products identified. No midline shift, mass effect, evidence of mass lesion, ventriculomegaly, extra-axial collection or acute intracranial hemorrhage. Cervicomedullary junction and pituitary are within normal limits. No dural thickening. Vascular: Major intracranial vascular flow voids are preserved. The major dural venous sinuses are enhancing and appear patent. Skull and upper cervical spine: Lower cervical spine disc and endplate degeneration with up to mild degenerative spinal  stenosis. Normal bone marrow signal. Sinuses/Orbits: Normal orbits soft tissues. The paranasal sinuses are clear. Other: Trace mastoid air cell fluid. Negative nasopharynx. Visible internal auditory structures appear normal. Negative orbit and scalp soft tissues. IMPRESSION: 1. Numerous small acute infarcts scattered in the bilateral anterior and posterior circulation, suggesting a recent embolic event from the heart or proximal aorta. 2. No associated hemorrhage or mass effect. 3. Underlying chronic small vessel disease. 4. No abnormal intracranial enhancement or metastatic disease identified. Electronically Signed   By: Genevie Ann M.D.   On: 06/18/2017 18:43    Procedures Procedures (including critical care time)  Medications Ordered in ED Medications  aspirin EC tablet 81 mg (81 mg Oral Given 06/18/17 2021)  atorvastatin (LIPITOR) tablet 20 mg (not administered)  aspirin EC tablet 81 mg (not administered)  multivitamin with minerals tablet 1 tablet (not administered)   stroke: mapping our early stages of recovery book (not administered)  acetaminophen (TYLENOL) tablet 650 mg (not administered)    Or  acetaminophen (TYLENOL) solution 650 mg (not administered)    Or  acetaminophen (TYLENOL) suppository 650 mg (not administered)  senna-docusate (Senokot-S) tablet 1 tablet (not administered)  potassium chloride 10 mEq in 100 mL IVPB (not administered)  magnesium sulfate IVPB 2 g 50 mL (not administered)  insulin aspart (novoLOG) injection 0-9 Units (not administered)  potassium chloride SA (K-DUR,KLOR-CON) CR tablet 40 mEq (40 mEq Oral Given 06/18/17 1948)  gadobenate dimeglumine (MULTIHANCE) injection 10 mL (10 mLs Intravenous Contrast Given 06/18/17 1829)  iopamidol (ISOVUE-300) 61 % injection (75 mLs  Contrast Given 06/18/17 1932)     Initial Impression / Assessment and Plan / ED Course  I have reviewed the triage vital signs and the nursing notes.  Pertinent labs & imaging results that  were available during my care of the patient were reviewed by me and considered in my medical decision making (see chart for details).     Patient is a 83 year old female with history of anxiety, CAD, hypertension, hyperlipidemia, recent UTI who resents with worsening generalized weakness for the last month as well as weight loss.  Patient was treated for a UTI about a month ago with a course of Vantin and Bactrim.  She states that the dysuria has gotten better but generalized weakness has not.  She has had a poor appetite as well.  Yesterday she had 3 hours where she was leaning to the left and seemed to have left-sided weakness.  That had resolved.  On exam lung sounds are clear, heart sounds are normal.  She has no abdominal tenderness.  No significant leg swelling.  Neurological exam shows normal strength, however she has mild left pronator drift and mild dysmetria in the left upper extremity.  She states she prefers not to stand up and try to walk so Romberg test and gait assessment deferred.  Workup obtained as above significant for potassium of 2.8, no leukocytosis.  Negative troponin.  She has a right lacunar infarct that is old which may explain some of her symptoms.  Her left leg disturbances may also be affecting her generalized weakness and possible reactivation of old stroke.  Plan to rule out acute or subacute stroke with MRI brain.  Her chest x-ray showed a possible mass so CT chest with contrast will be obtained to further evaluate.  Neurology contacted regarding her symptoms and he recommended checking a magnesium as well as following up on the UA results.  He has to recontact him once the MRI resulted.  Reviewed MRI results that 6:53 PM, which showed multiple acute infarcts in the posterior and anterior circulation likely from recent embolic event.  Reaching out to neurology with the results.  Dr. Cheral Marker saw the patient and provide recommendations.  CT chest with contrast obtained  which shows concern of a bronchogenic carcinoma in the left lower lobe.  Patient informed of the findings of her workup.  Hospitalist consulted for admission for further workup and management of acute embolic stroke as well as workup of this new left lower lobe lung mass.   Final Clinical Impressions(s) / ED Diagnoses   Final diagnoses:  Acute embolic stroke [facility])  Lung mass    ED Discharge Orders    None       Tobie Poet, DO 06/19/17 0032    Carmin Muskrat, MD 06/19/17 [phone]

## 2017-06-18 NOTE — ED Notes (Signed)
ED Provider at bedside. 

## 2017-06-18 NOTE — ED Notes (Signed)
Neurology @ bedside.

## 2017-06-18 NOTE — Consult Note (Addendum)
NEUROHOSPITALISTS - STROKE CONSULTATION NOTE   Requesting Physician: Dr. Carmin Muskrat [facility]: Dr. Kerney Elbe  Admit date: 06/18/2017    Chief Complaint: Left sided weakness  History obtained from:   Patient, Husband  and Chart     HPI   [name] [name] is an 83 y.o. female [place] a PMH of HTN, HLD and CAD who presents with worsening left sided weakness since yesterday. Husband states he first noticed her worsening weakness yesterday when he was helping her after she fell in the bathroom. She sustained no injury and was able to ambulate back to bed but he noticed that her left leg appeared weaker than her Right. The husband reports the patient has been sick with a UTI for over one month. He states she has some mild cognitive decline over the past year. The patient states she has had generalized weakness since she started antibiotics for the UTI.  Patient denies any H/A, visual changes, no CP or SOB. Husband denies noticing any slurred speech or worsening confusion  Date last known well: Date: 06/18/2017 Time last known well: unknown tPA Given: No: outside the window Modified Rankin: Rankin Score=1 NIH Stroke Scale: 2  Past Medical History    Past Medical History:  Diagnosis Date  . Anxiety    pt denies  . Arthritis    back, legs  . CAD (coronary artery disease) 2008   s/p Taxus Element Perseus Study stent (DES) to OM1 in 08/2006; EF 65%  . HTN (hypertension)   . Hypercholesterolemia   . Pneumonia 5/16   hospitalized for 4 days   Past Surgical History   Past Surgical History:  Procedure Laterality Date  . APPENDECTOMY  1950  . CORONARY ANGIOPLASTY WITH STENT PLACEMENT    . GANGLION CYST EXCISION Right 12/26/2014   Procedure: EXCISON OF RIGHT FOOT GANGLION CYST;  Surgeon: Wylene Simmer, MD;  Location: [facility];  Service: Orthopedics;  Laterality: Right;  . lower back surgery   1976  . NECK SURGERY  1990  . PARTIAL HYSTERECTOMY  1977  . PCI of hte lesion in th marginal branch circumflex artery     Family History   Family History  Problem Relation Age of Onset  . Heart failure Mother   . Hypertension Mother     Social History   reports that she quit smoking about 6 years ago. Her smoking use included cigarettes. She has a 40.00 pack-year smoking history. she has never used smokeless tobacco. She reports that she does not drink alcohol or use drugs.  Allergies   Allergies  Allergen Reactions  . Other Anaphylaxis    Food or preservative allergy of unknown/undetermined origin (happened last in 2017 when the patient had gone out to eat and hadn't yet touched her steak??)   Medications Prior to Admission   No current facility-administered medications on file prior to encounter.    Current Outpatient Medications on File Prior to Encounter  Medication Sig Dispense Refill  . aspirin 81 MG tablet Take 81 mg by mouth 2 (two) times a week.     Marland Kitchen EPINEPHrine (EPIPEN 2-PAK) 0.3 mg/0.3 mL IJ SOAJ injection Inject 0.3 mg into the muscle once as needed (for anaphylaxis).    . felodipine (PLENDIL) 10 MG 24 hr tablet Take 10 mg by mouth daily.      . Multiple Vitamin (MULTIVITAMIN WITH MINERALS) TABS tablet Take 1 tablet by mouth daily.    . nitroGLYCERIN (NITROSTAT) 0.4 MG SL tablet Place 1  tablet (0.4 mg total) under the tongue every 5 (five) minutes as needed for chest pain. 25 tablet 3  . cefpodoxime (VANTIN) 100 MG tablet Take 1 tablet (100 mg total) by mouth daily. (Patient not taking: Reported on 06/18/2017) 7 tablet 0  . ondansetron (ZOFRAN ODT) 4 MG disintegrating tablet Take 1 tablet (4 mg total) by mouth every 8 (eight) hours as needed for nausea or vomiting. (Patient not taking: Reported on 06/18/2017) 20 tablet 0   ROS  History obtained from the patient General ROS: negative for - chills, fatigue, fever, night sweats, weight gain or weight  loss Psychological ROS: negative for - behavioral disorder, hallucinations, memory difficulties, mood swings or suicidal ideation Ophthalmic ROS: negative for - blurry vision, double vision, eye pain or loss of vision ENT ROS: negative for - epistaxis, nasal discharge, oral lesions, sore throat, tinnitus or vertigo Allergy and Immunology ROS: negative for - hives or itchy/watery eyes Hematological and Lymphatic ROS: negative for - bleeding problems, bruising or swollen lymph nodes Endocrine ROS: negative for - galactorrhea, hair pattern changes, polydipsia/polyuria or temperature intolerance Respiratory ROS: negative for - cough, hemoptysis, shortness of breath or wheezing Cardiovascular ROS: negative for - chest pain, dyspnea on exertion, edema or irregular heartbeat Gastrointestinal ROS: negative for - abdominal pain, diarrhea, hematemesis, nausea/vomiting or stool incontinence Genito-Urinary ROS: negative for - dysuria, hematuria, incontinence or urinary frequency/urgency Musculoskeletal ROS: negative for - joint swelling or muscular weakness Neurological ROS: as noted in HPI Dermatological ROS: negative for rash and skin lesion changes  Physical Examination   Vitals:   06/18/17 1339  BP: (!) 153/87  Resp: 20  Temp: 98.1 F (36.7 C)  TempSrc: Oral  SpO2: 95%  Weight: 52.6 kg (116 lb)   HEENT-  Normocephalic,  Cardiovascular - Regular rate and rhythm  Respiratory - Lungs clear bilaterally. Non-labored breathing, No wheezing. Abdomen - soft and non-tender, BS normal Extremities- no edema or cyanosis Skin-warm and dry  Neurological Examination  Mental Status: Alert, oriented to self, husband, city, month. Got her age and year wrong. Thought content mostly appropriate. Poor short term memory. Slow processing. Speech fluent without evidence of aphasia or neglect.  Able to follow 3 step commands without difficulty. Cranial Nerves: II: Visual Fields are full. Pupils are equal,  round, and reactive to light.   III,IV, VI: EOMI without ptosis or diplopia. No nystagmus.   V: Facial sensation is symmetric to temperature VII: Facial movement is symmetric.  VIII: hearing is intact to voice X: Uvula elevates symmetrically XI: Shoulder shrug is symmetric. XII: tongue is midline without atrophy or fasciculations.  Motor: Tone is normal. Bulk is normal. 5/5 strength was present in all four extremities, except Left Hip Flexor and Left knee are 4+/5 Sensor: Sensation is slightly decreased on the Left. +extinction on Left Deep Tendon Reflexes: 2+ and symmetric throughout in the biceps and patellae Plantars: Toes are downgoing bilaterally.  Cerebellar:  finger-to-nose and normal heel-to-shin test are slightly ataxic on the left Gait: Deferred due to falls risk concerns  Laboratory Results  CBC: Recent Labs  Lab 06/18/17 1351 06/18/17 1402  WBC 9.5  --   HGB 12.0 12.2  HCT 36.8 36.0  MCV 86.2  --   PLT 429*  --    Basic Metabolic Panel: Recent Labs  Lab 06/18/17 1351 06/18/17 1402  NA 138 142  K 2.8* 2.9*  CL 103 103  CO2 25  --   GLUCOSE 108* 103*  BUN 5* 3*  CREATININE  0.83 0.70  CALCIUM 8.3*  --    Coagulation Studies: Recent Labs    06/18/17 1351  APTT 30  INR 1.04   Imaging Results   Ct Head Wo Contrast Result Date: 06/18/2017 IMPRESSION: Chronic changes as described. Progressive atrophy since 2018. No acute intracranial findings. Electronically Signed   By: Staci Righter M.D.   On: 06/18/2017 15:17   Mr Brain W And Wo Contrast Result Date: 06/18/2017 IMPRESSION: 1. Numerous small acute infarcts scattered in the bilateral anterior and posterior circulation, suggesting a recent embolic event from the heart or proximal aorta. 2. No associated hemorrhage or mass effect. 3. Underlying chronic small vessel disease. 4. No abnormal intracranial enhancement or metastatic disease identified. Electronically Signed   By: Genevie Ann M.D.   On: 06/18/2017 18:43    ECHO  05/09/2017 Study Conclusions - Left ventricle: The cavity size was normal. Wall thickness was   normal. Systolic function was normal. The estimated ejection   fraction was in the range of 60% to 65%. Wall motion was normal;   there were no regional wall motion abnormalities. Doppler   parameters are consistent with abnormal left ventricular   relaxation (grade 1 diastolic dysfunction).  B/L Carotid Duplex                                  PENDING  IMPRESSION AND PLAN  [name] [name] is a 83 y.o. female with PMH of HTN, HLD and CAD who presents with worsening Left sided weakness since yesterday. MRI reveals: Numerous small acute infarcts scattered in the bilateral anterior and posterior circulation with involvement of multiple vascular territories  Suspected Etiology: Likely cardioembolic Resultant Symptoms: Left sided deficits Stroke Risk Factors: hyperlipidemia and hypertension Other Stroke Risk Factors: Advanced age, Hx of CVA, CAD s/p stent  Outstanding Stroke Work-up Studies:     B/L Carotid U/S:                                                     PENDING May need TEE /Loop Recorder:                             PENDING  PLAN  06/18/2017  Patient will be admitted to the hospitalists and stroke workup will be obtained. Frequent Neurochecks  Telemetry Monitoring NPO until passes Stroke swallow screen Continue Aspirin. Add Statin. Stroke team to consider anticoagulation when sufficient time interval has  elapsed to reduce the risk of hemorrhagic conversion HgbA1C and Lipid Profile PT/OT/SLP Consult PM & Rehab Consult Case Management /MSW Carotid ultrasound MRA head Has had recent TTE. Will need TEE and possibly Loop Recorder Placement Ongoing aggressive stroke risk factor management Patient will be counseled to be compliant with her antithrombotic medications Patient will be counseled on Lifestyle modifications including, Diet, Exercise, and Stress Follow up with [facility] in 6 weeks  NEUROLOGICAL  ISSUES  HX OF STROKES: Chronic RIGHT basal ganglia lacunar infarct Baseline Findings: None  DYSPHAGIA: NPO until passes SLP swallow evaluation Aspiration Precautions in progress  MEDICAL ISSUES   R/O AFIB: -If A.FIB found on telemetry monitoring, the patient will need AC therapy. Stroke team to consider anticoagulation when sufficient time interval has  elapsed to reduce the risk of hemorrhagic conversion -May need TEE and Loop Recorder Placement  HYPERTENSION: Stable Permissive hypertension (OK if <220/120) for 24-48 hours post stroke and then gradually normalized within 5-7 days. Long term BP goal normotensive. May slowly restart home B/P medications after 48 hours Home Meds: yes  HYPERLIPIDEMIA: CURRENT LIPID PANEL PENDING    Component Value Date/Time   CHOL 233 (H) 01/06/2011 0948   TRIG 86.0 01/06/2011 0948   HDL 60.10 01/06/2011 0948   CHOLHDL 4 01/06/2011 0948   VLDL 17.2 01/06/2011 0948   LDLCALC 115 (H) 09/06/2006 0853  Home Meds:  NONE LDL  goal < 70  Started on  Lipitor to 20 mg daily Continue statin at discharge  DIABETES: HGA1C PENDING     No results found for: HGBA1C HgbA1c goal < 7.0 Currently on:  Continue CBG monitoring and SSI to maintain glucose 140-180 mg/dl DM education     Hospital day # 0 VTE prophylaxis: SCD's  Diet - Diet NPO time specified     FAMILY UPDATES: No family at bedside  TEAM UPDATES:Lockwood, Herbie [place], MD STATUS:    Discharge Information  Prior Home Stroke Medications: ASA 81 mg twice a week Discharge Stroke Meds:  Please discharge patient on TBD     Disposition:  Therapy Recs:               PENDING  Follow up Appointments  Follow Up:  Leonard Downing, MD -PCP Follow up in 1-2 weeks       Assessment and plan discussed with with attending physician and they are in agreement.    Mary Sella, ANP-C [facility] 06/18/2017, 6:54 PM   I have  seen and examined the patient. Amended assessment and plan as above. Electronically signed: Dr. Kerney Elbe

## 2017-06-18 NOTE — ED Notes (Signed)
Pt placed on bedpan by family per request

## 2017-06-18 NOTE — ED Notes (Signed)
Pt family states that she was treated for a UTI a month ago and has never regained her strength from that. Pt fell yesterday and denies any head trauma or LOC

## 2017-06-18 NOTE — ED Triage Notes (Addendum)
Pt presents to the ed with ems from home with complaints of progressive weakness x 1 month.  Pt denies any pain. Pt is alert and oriented. Unable to ambulate due to weakness. Pt weak throughout, more on the left arm than the right arm. Denies any pain.

## 2017-06-18 NOTE — ED Notes (Signed)
Patient transported to MRI via wheelchair.

## 2017-06-18 NOTE — H&P (Signed)
History and Physical    [name] [name] BMW:[id number] DOB: Apr 04, 1935 DOA: 06/18/2017  PCP: Leonard Downing, MD   Patient coming from: Home  Chief Complaint: Weakness   HPI: [name] [name] is a 83 y.o. female with medical history significant for hypertension and CAD with remote stent, now presenting to the emergency department for evaluation of weakness.  Patient had reportedly been generally weak for the past month, but was noted to have acute worsening, particularly on the left side over the past 1-2 days.  She had been treated for a UTI approximately 1 month ago, but the generalized weakness persisted.  Patient fell in the bathroom without hitting her head or suffering any significant injury yesterday, but was noted to have more left leg weakness than right when her husband was helping her up.  This persisted today, prompting her presentation to the ED.  ED Course: Upon arrival to the ED, patient is found to be afebrile, saturating well on room air, and with vitals otherwise stable.  EKG features a sinus rhythm with fusion complexes and nonspecific ST abnormality.  Chest x-ray is notable for gross abnormality with a masslike opacity in the left lung.  CT of the chest reveals a left lower lobe mass with highly aggressive appearance and underlying COPD.  Noncontrast head CT is negative for acute intracranial abnormality.  Chemistry panel reveals a potassium of 2.8.  CBC is notable for mild thrombocytosis with platelets 429,000.  Neurology was consulted by the ED physician and the patient underwent an MRI brain revealing numerous small acute infarcts scattered throughout the bilateral anterior and posterior circulations.  She remained hemodynamically stable in the ED, has not been in any respiratory distress, and will be admitted to the telemetry unit for ongoing evaluation and management of acute multifocal ischemic strokes and concerning left lung mass.  Review of Systems:  All other systems  reviewed and apart from HPI, are negative.  Past Medical History:  Diagnosis Date  . Anxiety    pt denies  . Arthritis    back, legs  . CAD (coronary artery disease) 2008   s/p Taxus Element Perseus Study stent (DES) to OM1 in 08/2006; EF 65%  . HTN (hypertension)   . Hypercholesterolemia   . Pneumonia 5/16   hospitalized for 4 days    Past Surgical History:  Procedure Laterality Date  . APPENDECTOMY  1950  . CORONARY ANGIOPLASTY WITH STENT PLACEMENT    . GANGLION CYST EXCISION Right 12/26/2014   Procedure: EXCISON OF RIGHT FOOT GANGLION CYST;  Surgeon: Wylene Simmer, MD;  Location: [place];  Service: Orthopedics;  Laterality: Right;  . lower back surgery  1976  . NECK SURGERY  1990  . PARTIAL HYSTERECTOMY  1977  . PCI of hte lesion in th marginal branch circumflex artery       reports that she quit smoking about 6 years ago. Her smoking use included cigarettes. She has a 40.00 pack-year smoking history. she has never used smokeless tobacco. She reports that she does not drink alcohol or use drugs.  Allergies  Allergen Reactions  . Other Anaphylaxis    Food or preservative allergy of unknown/undetermined origin (happened last in 2017 when the patient had gone out to eat and hadn't yet touched her steak??)    Family History  Problem Relation Age of Onset  . Heart failure Mother   . Hypertension Mother      Prior to Admission medications   Medication Sig Start Date End  Date Taking? Authorizing Provider  aspirin 81 MG tablet Take 81 mg by mouth 2 (two) times a week.    Yes [provider]  EPINEPHrine (EPIPEN 2-PAK) 0.3 mg/0.3 mL IJ SOAJ injection Inject 0.3 mg into the muscle once as needed (for anaphylaxis).   Yes [provider]  felodipine (PLENDIL) 10 MG 24 hr tablet Take 10 mg by mouth daily.     Yes [provider]  Multiple Vitamin (MULTIVITAMIN WITH MINERALS) TABS tablet Take 1 tablet by mouth daily. 05/11/17  Yes Georgette Shell, MD  nitroGLYCERIN (NITROSTAT) 0.4 MG SL tablet Place 1 tablet (0.4 mg total) under the tongue every 5 (five) minutes as needed for chest pain. 07/10/14  Yes Josue Hector, MD  ondansetron (ZOFRAN ODT) 4 MG disintegrating tablet Take 1 tablet (4 mg total) by mouth every 8 (eight) hours as needed for nausea or vomiting. Patient not taking: Reported on 06/18/2017 06/02/17   Pixie Casino, MD    Physical Exam: Vitals:   06/18/17 1915 06/18/17 2000 06/18/17 2100 06/18/17 2130  BP: (!) 157/65 (!) 165/78 (!) 150/77 (!) 148/66  Pulse: 78 76 81 78  Resp: 18 20 20  (!) 21  Temp:      TempSrc:      SpO2: 95% 97% 94% 94%  Weight:          Constitutional: NAD, calm, frail  Eyes: PERTLA, lids and conjunctivae normal ENMT: Mucous membranes are moist. Posterior pharynx clear of any exudate or lesions.   Neck: normal, supple, no masses, no thyromegaly Respiratory: clear to auscultation bilaterally, no wheezing, no crackles. Normal respiratory effort.   Cardiovascular: S1 & S2 heard, regular rate and rhythm. No extremity edema. No significant JVD. Abdomen: No distension, no tenderness, no masses palpated. Bowel sounds normal.  Musculoskeletal: no clubbing / cyanosis. No joint deformity upper and lower extremities.    Skin: no significant rashes, lesions, ulcers. Warm, dry, well-perfused. Neurologic: No facial asymmetry. Sensation to light touch intact. Generally listless and weak, moreso involving LLE. Pronator drift on left.  Psychiatric: Alert and oriented x 3. Pleasant and cooperative.     Labs on Admission: I have personally reviewed following labs and imaging studies  CBC: Recent Labs  Lab 06/18/17 1351 06/18/17 1402  WBC 9.5  --   NEUTROABS 5.1  --   HGB 12.0 12.2  HCT 36.8 36.0  MCV 86.2  --   PLT 429*  --    Basic Metabolic Panel: Recent Labs  Lab 06/18/17 1351 06/18/17 1402 06/18/17 1913  NA 138 142  --   K 2.8* 2.9*  --   CL 103 103  --   CO2 25  --   --     GLUCOSE 108* 103*  --   BUN 5* 3*  --   CREATININE 0.83 0.70  --   CALCIUM 8.3*  --   --   MG  --   --  1.8   GFR: Estimated Creatinine Clearance: 46.4 mL/min (by C-G formula based on SCr of 0.7 mg/dL). Liver Function Tests: Recent Labs  Lab 06/18/17 1351  AST 23  ALT 12*  ALKPHOS 61  BILITOT 0.6  PROT 6.0*  ALBUMIN 2.8*   No results for input(s): LIPASE, AMYLASE in the last 168 hours. No results for input(s): AMMONIA in the last 168 hours. Coagulation Profile: Recent Labs  Lab 06/18/17 1351  INR 1.04   Cardiac Enzymes: No results for input(s): CKTOTAL, CKMB, CKMBINDEX, TROPONINI in the  last 168 hours. BNP (last 3 results) No results for input(s): PROBNP in the last 8760 hours. HbA1C: Recent Labs    06/18/17 1913  HGBA1C 5.4   CBG: Recent Labs  Lab 06/18/17 1356  GLUCAP 104*   Lipid Profile: Recent Labs    06/18/17 1913  CHOL 182  HDL 52  LDLCALC 109*  TRIG 105  CHOLHDL 3.5   Thyroid Function Tests: No results for input(s): TSH, T4TOTAL, FREET4, T3FREE, THYROIDAB in the last 72 hours. Anemia Panel: No results for input(s): VITAMINB12, FOLATE, FERRITIN, TIBC, IRON, RETICCTPCT in the last 72 hours. Urine analysis:    Component Value Date/Time   COLORURINE STRAW (A) 06/18/2017 2048   APPEARANCEUR CLEAR 06/18/2017 2048   LABSPEC 1.019 06/18/2017 2048   PHURINE 7.0 06/18/2017 2048   GLUCOSEU NEGATIVE 06/18/2017 2048   HGBUR NEGATIVE 06/18/2017 2048   BILIRUBINUR NEGATIVE 06/18/2017 2048   KETONESUR NEGATIVE 06/18/2017 2048   PROTEINUR NEGATIVE 06/18/2017 2048   UROBILINOGEN 0.2 08/23/2014 1502   NITRITE NEGATIVE 06/18/2017 2048   LEUKOCYTESUR NEGATIVE 06/18/2017 2048   Sepsis Labs: @LABRCNTIP (procalcitonin:4,lacticidven:4) )No results found for this or any previous visit (from the past 240 hour(s)).   Radiological Exams on Admission: Dg Chest 2 View  Result Date: 06/18/2017 CLINICAL DATA:  83 year old female with history of progressive  weakness for 1 month. EXAM: CHEST - 2 VIEW COMPARISON:  Chest x-ray 05/08/2017. FINDINGS: Lung volumes are normal. In the lower left hemithorax there is a 3.0 x 2.6 cm mass-like opacity, which is not confidently identified on lateral projection. This appears intimately associated with the anterior aspect of the left fourth rib, and could simply reflect a healing rib fracture, however, the possibility of a primary pulmonary lesion is not excluded. Alternatively, this could reflect in rounded area of pneumonia. No other consolidative airspace disease noted. No pleural effusions. No evidence of pulmonary edema. Heart size is normal. Upper mediastinal contours are within normal limits. Aortic atherosclerosis. IMPRESSION: 1. New mass-like opacity projecting over the lower left hemithorax seen only on the frontal projection. This is of uncertain etiology and significance, and could be infectious/inflammatory or neoplastic, or could alternatively reflect a healing fracture of the anterior aspect of the left fourth rib. Further evaluation with chest CT (preferably with IV contrast) is recommended in the near future to better evaluate this finding. 2. Aortic atherosclerosis. Electronically Signed   By: Vinnie Langton M.D.   On: 06/18/2017 16:41   Ct Head Wo Contrast  Result Date: 06/18/2017 CLINICAL DATA:  Complaints of progressive weakness.  Recent UTI. EXAM: CT HEAD WITHOUT CONTRAST TECHNIQUE: Contiguous axial images were obtained from the base of the skull through the vertex without intravenous contrast. COMPARISON:  05/01/2016. FINDINGS: Brain: No evidence for acute infarction, hemorrhage, mass lesion, hydrocephalus, or extra-axial fluid. Generalized atrophy. Chronic microvascular ischemic change. Chronic RIGHT basal ganglia lacunar infarct. Vascular: Calcification of the cavernous internal carotid arteries consistent with cerebrovascular atherosclerotic disease. No signs of intracranial large vessel occlusion.  Skull: Normal. Negative for fracture or focal lesion. Sinuses/Orbits: No acute finding. Other: None. IMPRESSION: Chronic changes as described. Progressive atrophy since 2018. No acute intracranial findings. Electronically Signed   By: Staci Righter M.D.   On: 06/18/2017 15:17   Ct Chest W Contrast  Result Date: 06/18/2017 CLINICAL DATA:  83 year old female with history of abnormal chest x-ray suspicious for pulmonary mass. EXAM: CT CHEST WITH CONTRAST TECHNIQUE: Multidetector CT imaging of the chest was performed during intravenous contrast administration. CONTRAST:  75 mL ISOVUE-300  IOPAMIDOL (ISOVUE-300) INJECTION 61% COMPARISON:  Chest CT 08/19/2011. FINDINGS: Cardiovascular: Heart size is normal. There is no significant pericardial fluid, thickening or pericardial calcification. There is aortic atherosclerosis, as well as atherosclerosis of the great vessels of the mediastinum and the coronary arteries, including calcified atherosclerotic plaque in the left main, left anterior descending, left circumflex and right coronary arteries. Mediastinum/Nodes: No pathologically enlarged mediastinal or hilar lymph nodes. Esophagus is unremarkable in appearance. No axillary lymphadenopathy. Lungs/Pleura: New left lower lobe mass measuring 3.0 x 2.2 x 2.0 cm (coronal image 44 of series 5 and axial image 105 of series 7), which has macrolobulated slightly spiculated margins, and is in contact with the right major fissure which is slightly retracted toward the lesion. Tiny right upper lobe calcified granuloma. A few scattered tiny 2-3 mm pulmonary nodules throughout the lungs bilaterally. No acute consolidative airspace disease. No pleural effusions. Diffuse bronchial wall thickening with mild centrilobular and paraseptal emphysema. Upper Abdomen: Irregular areas of hypoattenuation throughout the spleen, new compared to the prior examination, likely to reflect multifocal splenic infarctions. Small calcified granuloma in  the liver. 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 Musculoskeletal: Chronic appearing compression fractures of T6 and L1, most severe at T6 where there is 30% loss of anterior vertebral body height. There are no aggressive appearing lytic or blastic lesions noted in the visualized portions of the skeleton. IMPRESSION: 1. The finding on the recent chest radiograph corresponds to a 3.0 x 2.2 x 2.0 cm left lower lobe mass which is highly aggressive in appearance, very likely to represent a primary bronchogenic neoplasm. This is intimately associated with the left major fissure, but there is no associated mediastinal or hilar lymphadenopathy noted at this time. Further evaluation with PET-CT is recommended in the near future for additional diagnostic and staging purposes. 2. Multiple tiny 2-3 mm pulmonary nodules scattered throughout the lungs bilaterally, nonspecific and statistically favored to represent tiny areas of mucoid impaction within terminal bronchioles. Attention on any future follow-up imaging is recommended to ensure stability or regression. 3. Diffuse bronchial wall thickening with mild centrilobular and paraseptal emphysema; imaging findings suggestive of underlying COPD. 4. Aortic atherosclerosis, in addition to left main and 3 vessel coronary artery disease. 5. Multiple splenic infarcts. 6. 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 Aortic Atherosclerosis (ICD10-I70.0) and Emphysema (ICD10-J43.9). Electronically Signed   By: Vinnie Langton M.D.   On: 06/18/2017 20:26   Mr Jeri Cos And Wo Contrast  Result Date: 06/18/2017 CLINICAL DATA:  83 year old female with vomiting for 2 days. Left side weakness onset yesterday. Possible left lung mass. EXAM: MRI HEAD WITHOUT AND WITH CONTRAST TECHNIQUE: Multiplanar, multiecho pulse sequences of the brain and surrounding structures were obtained without and with intravenous contrast. CONTRAST:  62mL MULTIHANCE  GADOBENATE DIMEGLUMINE 529 MG/ML IV SOLN COMPARISON:  Head CT without contrast 1429 hours today, 05/01/2016. FINDINGS: Brain: There are numerous small foci of restricted diffusion scattered in both cerebral and cerebellar hemispheres. The largest areas of involvement are in the posterior right cerebellum (series 3, image 8), left cerebellar peduncle (image 12), and posterior right insula (image 24). Bilateral anterior and posterior vascular territories are affected. The deep gray matter nuclei are spared. Mild associated T2 and FLAIR hyperintensity at the larger areas of involvement. No associated hemorrhage or mass effect. No associated abnormal enhancement. Underlying widespread and confluent bilateral cerebral white matter T2 and FLAIR hyperintensity. Underlying chronic lacunar infarcts of the right basal ganglia. No chronic  cerebral blood products identified. No midline shift, mass effect, evidence of mass lesion, ventriculomegaly, extra-axial collection or acute intracranial hemorrhage. Cervicomedullary junction and pituitary are within normal limits. No dural thickening. Vascular: Major intracranial vascular flow voids are preserved. The major dural venous sinuses are enhancing and appear patent. Skull and upper cervical spine: Lower cervical spine disc and endplate degeneration with up to mild degenerative spinal stenosis. Normal bone marrow signal. Sinuses/Orbits: Normal orbits soft tissues. The paranasal sinuses are clear. Other: Trace mastoid air cell fluid. Negative nasopharynx. Visible internal auditory structures appear normal. Negative orbit and scalp soft tissues. IMPRESSION: 1. Numerous small acute infarcts scattered in the bilateral anterior and posterior circulation, suggesting a recent embolic event from the heart or proximal aorta. 2. No associated hemorrhage or mass effect. 3. Underlying chronic small vessel disease. 4. No abnormal intracranial enhancement or metastatic disease identified.  Electronically Signed   By: Genevie Ann M.D.   On: 06/18/2017 18:43    EKG: Independently reviewed. Sinus rhythm, fusion complexes, non-specific ST abnormality.   Assessment/Plan   1. Acute CVA  - Presents with a month of malaise and generalized weakness, then left-sided weakness for the past 1-2 days  - CT was negative for acute intracranial abnormality, but MRI brain reveals numerous small acute infarcts scattered about the bilateral anterior and posterior circulation  - Neurology is consulting and much appreciated  - Had recent TTE and TEE is recommended  - Obtain MRA head, carotid dopplers, fasting lipids, A1c  - Continue cardiac monitoring, frequent neuro checks, PT/OT/SLP evals - Continue ASA, add statin, consider anticoagulation   2. Left lung mass  - Chest CT reveals LLL mass with highly-aggressive appearance, underlying COPD changes  - No respiratory complaints on admission  - Will likely need PET-CT, tissue sampling  - Patient and family aware   3. Hypokalemia  - Serum potassium is 2.8 on admission  - Treated in ED with 40 mEq oral potassium  - Continue cardiac monitoring, given additional 20 mEq IV potassium, repeat chem panel in am    4. Hypertension  - BP mildly elevated on admission  - Permit hypertension in acute phase of ischemic CVA     DVT prophylaxis: SCD's  Code Status: Full  Family Communication: Husband and daughter updated at bedside Consults called: Neurology Admission status: Inpatient    Vianne Bulls, MD [facility] Pager [phone]  If 7PM-7AM, please contact night-coverage [email or web address] Password [facility]  06/18/2017, 10:36 PM

## 2017-06-19 ENCOUNTER — Inpatient Hospital Stay (HOSPITAL_COMMUNITY): Payer: Medicare Other

## 2017-06-19 ENCOUNTER — Encounter (HOSPITAL_COMMUNITY): Payer: Medicare Other

## 2017-06-19 LAB — CBC
HCT: 34.3 % — ABNORMAL LOW (ref 36.0–46.0)
HEMOGLOBIN: 10.9 g/dL — AB (ref 12.0–15.0)
MCH: 27.5 pg (ref 26.0–34.0)
MCHC: 31.8 g/dL (ref 30.0–36.0)
MCV: 86.4 fL (ref 78.0–100.0)
Platelets: 370 10*3/uL (ref 150–400)
RBC: 3.97 MIL/uL (ref 3.87–5.11)
RDW: 14.3 % (ref 11.5–15.5)
WBC: 7.9 10*3/uL (ref 4.0–10.5)

## 2017-06-19 LAB — LIPID PANEL
Cholesterol: 162 mg/dL (ref 0–200)
HDL: 46 mg/dL (ref >40)
LDL Cholesterol: 96 mg/dL (ref 0–99)
Total CHOL/HDL Ratio: 3.5 RATIO
Triglycerides: 101 mg/dL (ref <150)
VLDL: 20 mg/dL (ref 0–40)

## 2017-06-19 LAB — MAGNESIUM
MAGNESIUM: 2.8 mg/dL — AB (ref 1.7–2.4)
Magnesium: 2.6 mg/dL — ABNORMAL HIGH (ref 1.7–2.4)

## 2017-06-19 LAB — BASIC METABOLIC PANEL
ANION GAP: 9 (ref 5–15)
BUN: <5 mg/dL — ABNORMAL LOW (ref 6–20)
CALCIUM: 8.3 mg/dL — AB (ref 8.9–10.3)
CO2: 23 mmol/L (ref 22–32)
Chloride: 108 mmol/L (ref 101–111)
Creatinine, Ser: 0.73 mg/dL (ref 0.44–1.00)
GLUCOSE: 94 mg/dL (ref 65–99)
POTASSIUM: 3.6 mmol/L (ref 3.5–5.1)
SODIUM: 140 mmol/L (ref 135–145)

## 2017-06-19 LAB — GLUCOSE, CAPILLARY: GLUCOSE-CAPILLARY: 98 mg/dL (ref 65–99)

## 2017-06-19 LAB — CREATININE, SERUM
CREATININE: 0.78 mg/dL (ref 0.44–1.00)
GFR calc Af Amer: >60 mL/min (ref >60)
GFR calc non Af Amer: >60 mL/min (ref >60)

## 2017-06-19 LAB — POTASSIUM: Potassium: 3.4 mmol/L — ABNORMAL LOW (ref 3.5–5.1)

## 2017-06-19 MED ORDER — ENOXAPARIN SODIUM 40 MG/0.4ML ~~LOC~~ SOLN
40.0000 mg | SUBCUTANEOUS | Status: DC
  Administered 2017-06-19 – 2017-06-20 (×2): 40 mg via SUBCUTANEOUS
  Filled 2017-06-19 (×2): qty 0.4

## 2017-06-19 MED ORDER — POTASSIUM CHLORIDE CRYS ER 20 MEQ PO TBCR
60.0000 meq | EXTENDED_RELEASE_TABLET | Freq: Once | ORAL | Status: AC
  Administered 2017-06-19: 60 meq via ORAL
  Filled 2017-06-19: qty 3

## 2017-06-19 MED ORDER — IOPAMIDOL (ISOVUE-300) INJECTION 61%
INTRAVENOUS | Status: AC
  Filled 2017-06-19: qty 30

## 2017-06-19 MED ORDER — IOPAMIDOL (ISOVUE-300) INJECTION 61%
INTRAVENOUS | Status: AC
  Administered 2017-06-19: 100 mL via INTRAVENOUS
  Filled 2017-06-19: qty 100

## 2017-06-19 NOTE — Progress Notes (Signed)
Patient having VT in the 160's while working with PT, and VT in the 140's 10 mins after working with PT lasting for about 1 min. MD notified and oral potassium given for the management of symptoms.

## 2017-06-19 NOTE — Evaluation (Signed)
Occupational Therapy Evaluation Patient Details Name: [name] [name] MRN: [id number] DOB: 12-May-1934 Today's Date: 06/19/2017    History of Present Illness 83 y.o.femalewith medical history significant forhypertension and CAD with remote stent, admitted for evaluation of weakness. Patient had reportedly been generally weak for the past month, but was noted to have acute worsening on the left side over the 1-2 days PTA. Patient fell in the bathroom without hitting her head or suffering any significant injury but was noted to have more left leg weakness than right when her husband was helping her up. The next day they came to ED where MRI brain revealed numerous small acute infarcts scattered throughout the bilateral anterior and posterior circulation.    Clinical Impression   PTA, pt was living with her husband and was independent with ADLs; requiring assistance with LB ADLs recently and using RW during to weakness. Pt requiring Min A for UB ADLs, Max A for LB ADLs, Max A toileting, and Mod-Max A for stand pivot transfer. Pt presenting with LUE weakness and decreased coordination, blurry vision, and poor sitting/standing balance. Pt motivated to participate in therapy and has good family support. Pt would benefit from further acute OT to facilitate safe dc. Recommend dc to CIR for further intensive OT to optimize safety, independence with ADLs, and return to PLOF.      Follow Up Recommendations  CIR;Supervision/Assistance - 24 hour    Equipment Recommendations  Other (comment)(Defer to next venue)    Recommendations for Other Services Rehab consult;PT consult;Speech consult     Precautions / Restrictions Precautions Precautions: Fall Precaution Comments: HR Restrictions Weight Bearing Restrictions: No      Mobility Bed Mobility Overal bed mobility: Needs Assistance Bed Mobility: Supine to Sit;Sit to Supine     Supine to sit: Mod assist Sit to supine: Min assist   General bed  mobility comments: Mod A to elevate trunk and bring hips towards EOB. Min A to manage BLEs  Transfers Overall transfer level: Needs assistance Equipment used: Rolling walker (2 wheeled);None Transfers: Sit to/from [facility] Sit to Stand: Mod assist;+2 physical assistance(with RW) Stand pivot transfers: Mod assist;Max assist(to/from BSC)       General transfer comment: on return to bed appeared pt trying to do more of a squat-pivot and did not fully stand    Balance Overall balance assessment: Needs assistance Sitting-balance support: Single extremity supported;Feet supported;Feet unsupported Sitting balance-Leahy Scale: Poor Sitting balance - Comments: slow lean to right as she fatigues; initially can correct without assist progressing to min-mod assist Postural control: Right lateral lean;Posterior lean Standing balance support: Single extremity supported Standing balance-Leahy Scale: Poor Standing balance comment: first sit to stand prior to pivot to [facility], pt using her RUE on therapist's arm/shoulder with upright posture and good weigth-bearing thru LLE                           ADL either performed or assessed with clinical judgement   ADL Overall ADL's : Needs assistance/impaired Eating/Feeding: Set up;Sitting;Bed level(supported sitting)   Grooming: Minimal assistance;Sitting Grooming Details (indicate cue type and reason): Min A for sitting balance and bilateral tasks Upper Body Bathing: Minimal assistance;Sitting   Lower Body Bathing: Maximal assistance;Sit to/from stand   Upper Body Dressing : Minimal assistance;Sitting   Lower Body Dressing: Maximal assistance;Sit to/from stand   Toilet Transfer: Maximal assistance;Moderate assistance;+2 for safety/equipment;BSC;Stand-pivot Toilet Transfer Details (indicate cue type and reason): Mod A for stand pivot to  BSC. Requiring Max cues for sequencing. Max A for stand pivot to return to EOB.   Toileting- Clothing Manipulation and Hygiene: Maximal assistance;Sit to/from stand;+2 for physical assistance Toileting - Clothing Manipulation Details (indicate cue type and reason): Max A for standing balance while second perform perform toilet hygiene.      Functional mobility during ADLs: Maximal assistance;+2 for safety/equipment(stand pivot only) General ADL Comments: Pt with decreased functional performnce. Demosntrating decreased balance, cognition, strength     Vision Baseline Vision/History: Wears glasses Wears Glasses: At all times Patient Visual Report: Blurring of vision Additional Comments: Need further assessment     Perception     Praxis      Pertinent Vitals/Pain Pain Assessment: No/denies pain     Hand Dominance Right   Extremity/Trunk Assessment Upper Extremity Assessment Upper Extremity Assessment: LUE deficits/detail LUE Deficits / Details: Weakness and decreased ROM. Pt able to bring hand up to RW handle and to mouth with difficulty. Very slow moving. Decreased grasp LUE Coordination: decreased fine motor;decreased gross motor   Lower Extremity Assessment Lower Extremity Assessment: Defer to PT evaluation LLE Deficits / Details: functionally LLE grossly : hip extension 3/5, hip flexion 2+, knee extension 3+ (pt having arrhythmia with ?v-tach and RN in to assess as putting pt back to bed, therefore specific testing not completed   Cervical / Trunk Assessment Cervical / Trunk Assessment: Kyphotic   Communication Communication Communication: No difficulties   Cognition Arousal/Alertness: Awake/alert Behavior During Therapy: WFL for tasks assessed/performed Overall Cognitive Status: Impaired/Different from baseline Area of Impairment: Following commands;Problem solving                       Following Commands: Follows one step commands with increased time     Problem Solving: Slow processing;Requires verbal cues General Comments: Pt  requiring increased time and cues   General Comments  HR elevating to 145 during toileting; RN notified    Exercises     Shoulder Instructions      Home Living Family/patient expects to be discharged to:: Private residence Living Arrangements: Spouse/significant other Available Help at Discharge: Family Type of Home: House Home Access: Stairs to enter Technical brewer of Steps: 2 Entrance Stairs-Rails: None Home Layout: One level     Bathroom Shower/Tub: Walk-in shower;Tub/shower unit   Bathroom Toilet: Handicapped height     Home Equipment: Clinical cytogeneticist - 2 wheels          Prior Functioning/Environment Level of Independence: Needs assistance  Gait / Transfers Assistance Needed: Recently been using RW for functional mobility due to weakness ADL's / Homemaking Assistance Needed: Husband has been assisting with LB ADLs recently due to weakness   Comments: Husband and daughter perform IADLs        OT Problem List: Decreased strength;Decreased range of motion;Decreased activity tolerance;Impaired balance (sitting and/or standing);Impaired vision/perception;Decreased coordination;Decreased cognition;Decreased safety awareness;Decreased knowledge of use of DME or AE;Decreased knowledge of precautions;Impaired UE functional use      OT Treatment/Interventions: Self-care/ADL training;Therapeutic exercise;Energy conservation;DME and/or AE instruction;Therapeutic activities;Patient/family education    OT Goals(Current goals can be found in the care plan section) Acute Rehab OT Goals Patient Stated Goal: be able to return home OT Goal Formulation: With patient Time For Goal Achievement: 07/03/17 Potential to Achieve Goals: Good ADL Goals Pt Will Perform Grooming: standing;with min guard assist Pt Will Perform Upper Body Dressing: with set-up;with supervision;sitting Pt Will Perform Lower Body Dressing: with min guard assist;sit to/from stand Pt Will Transfer to  Toilet: with min guard assist;ambulating;bedside commode Pt Will Perform Toileting - Clothing Manipulation and hygiene: with min guard assist;sit to/from stand  OT Frequency: Min 2X/week   Barriers to D/C:            Co-evaluation PT/OT/SLP Co-Evaluation/Treatment: Yes Reason for Co-Treatment: For patient/therapist safety PT goals addressed during session: Mobility/safety with mobility;Balance OT goals addressed during session: ADL's and self-care      AM-PAC PT "6 Clicks" Daily Activity     Outcome Measure Help from another person eating meals?: None Help from another person taking care of personal grooming?: A Little Help from another person toileting, which includes using toliet, bedpan, or urinal?: A Lot Help from another person bathing (including washing, rinsing, drying)?: A Lot Help from another person to put on and taking off regular upper body clothing?: A Little Help from another person to put on and taking off regular lower body clothing?: A Lot 6 Click Score: 16   End of Session Equipment Utilized During Treatment: Gait belt;Rolling walker Nurse Communication: Mobility status  Activity Tolerance: Patient tolerated treatment well Patient left: in bed;with call bell/phone within reach;with bed alarm set;with family/visitor present;with nursing/sitter in room  OT Visit Diagnosis: Unsteadiness on feet (R26.81);Other abnormalities of gait and mobility (R26.89);Muscle weakness (generalized) (M62.81);Other symptoms and signs involving cognitive function;Hemiplegia and hemiparesis Hemiplegia - Right/Left: Left Hemiplegia - dominant/non-dominant: Non-Dominant Hemiplegia - caused by: Cerebral infarction                Time: 8315-1761 OT Time Calculation (min): 34 min Charges:  OT General Charges $OT Visit: 1 Visit OT Evaluation $OT Eval Moderate Complexity: 1 Mod G-Codes:     Anayiah Howden MSOT, OTR/L Acute Rehab Pager: [phone] Office: [place] 06/19/2017, 5:03 PM

## 2017-06-19 NOTE — Progress Notes (Signed)
Patient 83 year old  White female from [facility] ED with progressive weakness mostly to the left. Pt alert and oriented x 2  Mixed up with the year. Sid 1920 instead of 2019.  Pt MAE with slight weakness and ataxia to LLE . Pupils equal and reactive.  VS stable pt denied any sob nor pain.  SR on the monitor rate in 70 s -80. Pt and spouse both oriented to nursing  staff and room. Care plan reviewed, safety and fall precaution education  given both pt and spouse verbalized good understanding. Call bell within pt reach to call for help as needed. Pt RN will continue to monitor.

## 2017-06-19 NOTE — Progress Notes (Signed)
Patient ID: [name] [name], female   DOB: 1934-08-30, 83 y.o.   MRN: [id number]  PROGRESS NOTE    [name] [name]  HQP:[id number] DOB: [birth year] DOA: 06/18/2017 PCP: Leonard Downing, MD   Brief Narrative:  83 year old female with history of hypertension and CAD with remote stent presented with weakness.  CT of the head was unremarkable.  MRI of the brain revealed numerous small acute infarcts.  Neurology was consulted.  CT of the chest showed left lower lobe mass.  Assessment & Plan:   Principal Problem:   Acute CVA (cerebrovascular accident) [facility]) Active Problems:   Essential hypertension   Mass of lower lobe of left lung   Hypokalemia   Ischemic stroke ([place])   1. Acute CVA  - CT was negative for acute intracranial abnormality, but MRI brain reveals numerous small acute infarcts scattered about the bilateral anterior and posterior circulation  - Neurology following  - Had recent TTE on 05/29/2017 which showed EF of 60-65% with grade 1 diastolic dysfunction.  Neurology is recommending TEE and loop recorder.  Will notify cardiology for the same  -MRI of the head did not show any large vessel occlusion - Continue cardiac monitoring, frequent neuro checks, PT/OT/SLP evals - Continue ASA and statin.  2. Left lung mass  - Chest CT reveals LLL mass with highly-aggressive appearance, underlying COPD changes  -Spoke to patient and husband at bedside and they would want to proceed with tissue biopsy.  We will consult interventional radiology for the same.  CT of the abdomen and pelvis. MRI of the brain did not show any signs of metastases  -Depending on the pathology, patient will need outpatient oncology evaluation   3. Hypokalemia  - Improved.  Repeat a.m. labs  4. Hypertension  - Monitor blood pressure.  Allow permissive hypertension.   DVT prophylaxis: SCDs.  We will start Lovenox Code Status: Full Family Communication: Husband at bedside Disposition Plan: Depends on clinical  outcome  Consultants: Neurology  Procedures: None  Antimicrobials: None   Subjective: Patient seen and examined at bedside.  She denies overnight fever, nausea or vomiting.  She feels weak and tired.  Objective: Vitals:   06/18/17 2300 06/19/17 0101 06/19/17 0448 06/19/17 0722  BP: (!) 149/64 (!) 148/65 124/69 (!) 124/58  Pulse: 78 71    Resp: 17 18    Temp:  98.8 F (37.1 C) 99.2 F (37.3 C)   TempSrc:  Oral Oral   SpO2: 93% 94%    Weight:  47.4 kg (104 lb 8 oz)    Height:  5\' 2"  (1.575 m)      Intake/Output Summary (Last 24 hours) at 06/19/2017 0958 Last data filed at 06/19/2017 0805 Gross per 24 hour  Intake 50 ml  Output -  Net 50 ml   Filed Weights   06/18/17 1339 06/19/17 0101  Weight: 52.6 kg (116 lb) 47.4 kg (104 lb 8 oz)    Examination:  General exam: Appears calm and comfortable.  Very thinly built female Respiratory system: Bilateral decreased breath sound at bases with scattered crackles Cardiovascular system: S1 & S2 heard; rate controlled  gastrointestinal system: Abdomen is nondistended, soft and nontender. Normal bowel sounds heard. Central nervous system: Alert and oriented. No focal neurological deficits. Moving extremities Extremities: No cyanosis, clubbing, edema  Skin: No rashes, lesions or ulcers Psychiatry: Judgement and insight appear normal. Mood & affect appropriate.     Data Reviewed: I have personally reviewed following labs and imaging studies  CBC: Recent Labs  Lab 06/18/17 1351 06/18/17 1402  WBC 9.5  --   NEUTROABS 5.1  --   HGB 12.0 12.2  HCT 36.8 36.0  MCV 86.2  --   PLT 429*  --    Basic Metabolic Panel: Recent Labs  Lab 06/18/17 1351 06/18/17 1402 06/18/17 1913 06/19/17 0552 06/19/17 0834  NA 138 142  --  140  --   K 2.8* 2.9*  --  3.6  --   CL 103 103  --  108  --   CO2 25  --   --  23  --   GLUCOSE 108* 103*  --  94  --   BUN 5* 3*  --  <5*  --   CREATININE 0.83 0.70  --  0.73  --   CALCIUM 8.3*  --    --  8.3*  --   MG  --   --  1.8  --  2.8*   GFR: Estimated Creatinine Clearance: 42 mL/min (by C-G formula based on SCr of 0.73 mg/dL). Liver Function Tests: Recent Labs  Lab 06/18/17 1351  AST 23  ALT 12*  ALKPHOS 61  BILITOT 0.6  PROT 6.0*  ALBUMIN 2.8*   No results for input(s): LIPASE, AMYLASE in the last 168 hours. No results for input(s): AMMONIA in the last 168 hours. Coagulation Profile: Recent Labs  Lab 06/18/17 1351  INR 1.04   Cardiac Enzymes: No results for input(s): CKTOTAL, CKMB, CKMBINDEX, TROPONINI in the last 168 hours. BNP (last 3 results) No results for input(s): PROBNP in the last 8760 hours. HbA1C: Recent Labs    06/18/17 1913  HGBA1C 5.4   CBG: Recent Labs  Lab 06/18/17 1356 06/19/17 0517  GLUCAP 104* 98   Lipid Profile: Recent Labs    06/18/17 1913 06/19/17 0552  CHOL 182 162  HDL 52 46  LDLCALC 109* 96  TRIG 105 101  CHOLHDL 3.5 3.5   Thyroid Function Tests: No results for input(s): TSH, T4TOTAL, FREET4, T3FREE, THYROIDAB in the last 72 hours. Anemia Panel: No results for input(s): VITAMINB12, FOLATE, FERRITIN, TIBC, IRON, RETICCTPCT in the last 72 hours. Sepsis Labs: No results for input(s): PROCALCITON, LATICACIDVEN in the last 168 hours.  No results found for this or any previous visit (from the past 240 hour(s)).       Radiology Studies: Dg Chest 2 View  Result Date: 06/18/2017 CLINICAL DATA:  83 year old female with history of progressive weakness for 1 month. EXAM: CHEST - 2 VIEW COMPARISON:  Chest x-ray 05/08/2017. FINDINGS: Lung volumes are normal. In the lower left hemithorax there is a 3.0 x 2.6 cm mass-like opacity, which is not confidently identified on lateral projection. This appears intimately associated with the anterior aspect of the left fourth rib, and could simply reflect a healing rib fracture, however, the possibility of a primary pulmonary lesion is not excluded. Alternatively, this could reflect in  rounded area of pneumonia. No other consolidative airspace disease noted. No pleural effusions. No evidence of pulmonary edema. Heart size is normal. Upper mediastinal contours are within normal limits. Aortic atherosclerosis. IMPRESSION: 1. New mass-like opacity projecting over the lower left hemithorax seen only on the frontal projection. This is of uncertain etiology and significance, and could be infectious/inflammatory or neoplastic, or could alternatively reflect a healing fracture of the anterior aspect of the left fourth rib. Further evaluation with chest CT (preferably with IV contrast) is recommended in the near future to better evaluate this finding. 2. Aortic atherosclerosis. Electronically Signed  By: Vinnie Langton M.D.   On: 06/18/2017 16:41   Ct Head Wo Contrast  Result Date: 06/18/2017 CLINICAL DATA:  Complaints of progressive weakness.  Recent UTI. EXAM: CT HEAD WITHOUT CONTRAST TECHNIQUE: Contiguous axial images were obtained from the base of the skull through the vertex without intravenous contrast. COMPARISON:  05/01/2016. FINDINGS: Brain: No evidence for acute infarction, hemorrhage, mass lesion, hydrocephalus, or extra-axial fluid. Generalized atrophy. Chronic microvascular ischemic change. Chronic RIGHT basal ganglia lacunar infarct. Vascular: Calcification of the cavernous internal carotid arteries consistent with cerebrovascular atherosclerotic disease. No signs of intracranial large vessel occlusion. Skull: Normal. Negative for fracture or focal lesion. Sinuses/Orbits: No acute finding. Other: None. IMPRESSION: Chronic changes as described. Progressive atrophy since 2018. No acute intracranial findings. Electronically Signed   By: Staci Righter M.D.   On: 06/18/2017 15:17   Ct Chest W Contrast  Result Date: 06/18/2017 CLINICAL DATA:  83 year old female with history of abnormal chest x-ray suspicious for pulmonary mass. EXAM: CT CHEST WITH CONTRAST TECHNIQUE: Multidetector CT  imaging of the chest was performed during intravenous contrast administration. CONTRAST:  75 mL ISOVUE-300 IOPAMIDOL (ISOVUE-300) INJECTION 61% COMPARISON:  Chest CT 08/19/2011. FINDINGS: Cardiovascular: Heart size is normal. There is no significant pericardial fluid, thickening or pericardial calcification. There is aortic atherosclerosis, as well as atherosclerosis of the great vessels of the mediastinum and the coronary arteries, including calcified atherosclerotic plaque in the left main, left anterior descending, left circumflex and right coronary arteries. Mediastinum/Nodes: No pathologically enlarged mediastinal or hilar lymph nodes. Esophagus is unremarkable in appearance. No axillary lymphadenopathy. Lungs/Pleura: New left lower lobe mass measuring 3.0 x 2.2 x 2.0 cm (coronal image 44 of series 5 and axial image 105 of series 7), which has macrolobulated slightly spiculated margins, and is in contact with the right major fissure which is slightly retracted toward the lesion. Tiny right upper lobe calcified granuloma. A few scattered tiny 2-3 mm pulmonary nodules throughout the lungs bilaterally. No acute consolidative airspace disease. No pleural effusions. Diffuse bronchial wall thickening with mild centrilobular and paraseptal emphysema. Upper Abdomen: Irregular areas of hypoattenuation throughout the spleen, new compared to the prior examination, likely to reflect multifocal splenic infarctions. Small calcified granuloma in the liver. 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 Musculoskeletal: Chronic appearing compression fractures of T6 and L1, most severe at T6 where there is 30% loss of anterior vertebral body height. There are no aggressive appearing lytic or blastic lesions noted in the visualized portions of the skeleton. IMPRESSION: 1. The finding on the recent chest radiograph corresponds to a 3.0 x 2.2 x 2.0 cm left lower lobe mass which is highly aggressive in appearance,  very likely to represent a primary bronchogenic neoplasm. This is intimately associated with the left major fissure, but there is no associated mediastinal or hilar lymphadenopathy noted at this time. Further evaluation with PET-CT is recommended in the near future for additional diagnostic and staging purposes. 2. Multiple tiny 2-3 mm pulmonary nodules scattered throughout the lungs bilaterally, nonspecific and statistically favored to represent tiny areas of mucoid impaction within terminal bronchioles. Attention on any future follow-up imaging is recommended to ensure stability or regression. 3. Diffuse bronchial wall thickening with mild centrilobular and paraseptal emphysema; imaging findings suggestive of underlying COPD. 4. Aortic atherosclerosis, in addition to left main and 3 vessel coronary artery disease. 5. Multiple splenic infarcts. 6. 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 Aortic Atherosclerosis (ICD10-I70.0) and Emphysema (ICD10-J43.9). Electronically Signed  By: Vinnie Langton M.D.   On: 06/18/2017 20:26   Mr Jeri Cos And Wo Contrast  Result Date: 06/18/2017 CLINICAL DATA:  83 year old female with vomiting for 2 days. Left side weakness onset yesterday. Possible left lung mass. EXAM: MRI HEAD WITHOUT AND WITH CONTRAST TECHNIQUE: Multiplanar, multiecho pulse sequences of the brain and surrounding structures were obtained without and with intravenous contrast. CONTRAST:  21mL MULTIHANCE GADOBENATE DIMEGLUMINE 529 MG/ML IV SOLN COMPARISON:  Head CT without contrast 1429 hours today, 05/01/2016. FINDINGS: Brain: There are numerous small foci of restricted diffusion scattered in both cerebral and cerebellar hemispheres. The largest areas of involvement are in the posterior right cerebellum (series 3, image 8), left cerebellar peduncle (image 12), and posterior right insula (image 24). Bilateral anterior and posterior vascular territories are affected. The deep gray matter  nuclei are spared. Mild associated T2 and FLAIR hyperintensity at the larger areas of involvement. No associated hemorrhage or mass effect. No associated abnormal enhancement. Underlying widespread and confluent bilateral cerebral white matter T2 and FLAIR hyperintensity. Underlying chronic lacunar infarcts of the right basal ganglia. No chronic cerebral blood products identified. No midline shift, mass effect, evidence of mass lesion, ventriculomegaly, extra-axial collection or acute intracranial hemorrhage. Cervicomedullary junction and pituitary are within normal limits. No dural thickening. Vascular: Major intracranial vascular flow voids are preserved. The major dural venous sinuses are enhancing and appear patent. Skull and upper cervical spine: Lower cervical spine disc and endplate degeneration with up to mild degenerative spinal stenosis. Normal bone marrow signal. Sinuses/Orbits: Normal orbits soft tissues. The paranasal sinuses are clear. Other: Trace mastoid air cell fluid. Negative nasopharynx. Visible internal auditory structures appear normal. Negative orbit and scalp soft tissues. IMPRESSION: 1. Numerous small acute infarcts scattered in the bilateral anterior and posterior circulation, suggesting a recent embolic event from the heart or proximal aorta. 2. No associated hemorrhage or mass effect. 3. Underlying chronic small vessel disease. 4. No abnormal intracranial enhancement or metastatic disease identified. Electronically Signed   By: Genevie Ann M.D.   On: 06/18/2017 18:43   Mr Jodene Nam Head Wo Contrast  Result Date: 06/19/2017 CLINICAL DATA:  Stroke follow-up EXAM: MRA HEAD WITHOUT CONTRAST TECHNIQUE: Angiographic images of the Circle of Willis were obtained using MRA technique without intravenous contrast. COMPARISON:  Brain MRI 06/18/2017 FINDINGS: Intracranial internal carotid arteries: Small medially projecting aneurysm from the distal cavernous segment of the left internal carotid artery,  measuring approximately 3 x 2 mm. Normal right internal carotid artery. Anterior cerebral arteries: Normal. Middle cerebral arteries: Mild stenosis of the right middle cerebral artery M1 segment. Otherwise normal middle cerebral arteries. Posterior communicating arteries: Present bilaterally. Posterior cerebral arteries: Normal. Basilar artery: Normal. Vertebral arteries: Left dominant. Normal. Superior cerebellar arteries: Normal. Anterior inferior cerebellar arteries: Normal. Posterior inferior cerebellar arteries: Normal. IMPRESSION: 1. No emergent large vessel occlusion or high-grade stenosis. Mild narrowing of the right middle cerebral artery M1 segment. 2. 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 Electronically Signed   By: Ulyses Jarred M.D.   On: 06/19/2017 00:57        Scheduled Meds: . aspirin EC  81 mg Oral Daily  . aspirin EC  81 mg Oral Daily  . atorvastatin  20 mg Oral q1800  . insulin aspart  0-9 Units Subcutaneous Q4H  . multivitamin with minerals  1 tablet Oral Daily   Continuous Infusions:   LOS: 1 day  Aline August, MD [facility] Pager [phone]  If 7PM-7AM, please contact night-coverage [email or web address] Password [facility] 06/19/2017, 9:58 AM

## 2017-06-19 NOTE — Progress Notes (Signed)
STROKE TEAM PROGRESS NOTE   HISTORY OF PRESENT ILLNESS (per record) [name] [name] is an 83 y.o. female [place] a PMH of HTN, HLD and CAD who presents with worsening left sided weakness since yesterday. Husband states he first noticed her worsening weakness yesterday when he was helping her after she fell in the bathroom. She sustained no injury and was able to ambulate back to bed but he noticed that her left leg appeared weaker than her Right. The husband reports the patient has been sick with a UTI for over one month. He states she has some mild cognitive decline over the past year. The patient states she has had generalized weakness since she started antibiotics for the UTI.  Patient denies any H/A, visual changes, no CP or SOB. Husband denies noticing any slurred speech or worsening confusion  Date last known well: Date: 06/18/2017 Time last known well: unknown tPA Given: No: outside the window Modified Rankin: Rankin Score=1 NIH Stroke Scale: 2   SUBJECTIVE (INTERVAL HISTORY) Her  Husband is at the bedside.   Husband states that patient has not been well since February.  She had recurrent urinary tract infections and generalized weakness.  Generalized weakness    OBJECTIVE Temp:  [98.1 F (36.7 C)-99.2 F (37.3 C)] 99.2 F (37.3 C) (03/17 0448) Pulse Rate:  [71-81] 71 (03/17 0101) Cardiac Rhythm: Normal sinus rhythm (03/17 0700) Resp:  [17-21] 18 (03/17 0101) BP: (124-165)/(58-87) 124/58 (03/17 0722) SpO2:  [93 %-97 %] 94 % (03/17 0101) Weight:  [104 lb 8 oz (47.4 kg)-116 lb (52.6 kg)] 104 lb 8 oz (47.4 kg) (03/17 0101)  CBC:  Recent Labs  Lab 06/18/17 1351 06/18/17 1402  WBC 9.5  --   NEUTROABS 5.1  --   HGB 12.0 12.2  HCT 36.8 36.0  MCV 86.2  --   PLT 429*  --     Basic Metabolic Panel:  Recent Labs  Lab 06/18/17 1351 06/18/17 1402 06/18/17 1913 06/19/17 0552  NA 138 142  --  140  K 2.8* 2.9*  --  3.6  CL 103 103  --  108  CO2 25  --   --  23  GLUCOSE 108*  103*  --  94  BUN 5* 3*  --  <5*  CREATININE 0.83 0.70  --  0.73  CALCIUM 8.3*  --   --  8.3*  MG  --   --  1.8  --     Lipid Panel:     Component Value Date/Time   CHOL 162 06/19/2017 0552   TRIG 101 06/19/2017 0552   HDL 46 06/19/2017 0552   CHOLHDL 3.5 06/19/2017 0552   VLDL 20 06/19/2017 0552   LDLCALC 96 06/19/2017 0552   HgbA1c:  Lab Results  Component Value Date   HGBA1C 5.4 06/18/2017   Urine Drug Screen: No results found for: LABOPIA, COCAINSCRNUR, LABBENZ, AMPHETMU, THCU, LABBARB  Alcohol Level No results found for: [facility]  IMAGING  Dg Chest 2 View 06/18/2017 IMPRESSION:  1. New mass-like opacity projecting over the lower left hemithorax seen only on the frontal projection. This is of uncertain etiology and significance, and could be infectious/inflammatory or neoplastic, or could alternatively reflect a healing fracture of the anterior aspect of the left fourth rib. Further evaluation with chest CT (preferably with IV contrast) is recommended in the near future to better evaluate this finding.  2. Aortic atherosclerosis.    Ct Head Wo Contrast 06/18/2017 IMPRESSION:  Chronic changes as described. Progressive  atrophy since 2018. No acute intracranial findings.    Ct Chest W Contrast 06/18/2017  IMPRESSION:  1. The finding on the recent chest radiograph corresponds to a 3.0 x 2.2 x 2.0 cm left lower lobe mass which is highly aggressive in appearance, very likely to represent a primary bronchogenic neoplasm. This is intimately associated with the left major fissure, but there is no associated mediastinal or hilar lymphadenopathy noted at this time. Further evaluation with PET-CT is recommended in the near future for additional diagnostic and staging purposes.  2. Multiple tiny 2-3 mm pulmonary nodules scattered throughout the lungs bilaterally, nonspecific and statistically favored to represent tiny areas of mucoid impaction within terminal bronchioles. Attention on any  future follow-up imaging is recommended to ensure stability or regression.  3. Diffuse bronchial wall thickening with mild centrilobular and paraseptal emphysema; imaging findings suggestive of underlying COPD.  4. Aortic atherosclerosis, in addition to left main and 3 vessel coronary artery disease.  5. Multiple splenic infarcts.  6. 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 Aortic Atherosclerosis (ICD10-I70.0) and Emphysema (ICD10-J43.9).     Mr Jeri Cos And Wo Contrast 06/18/2017 IMPRESSION:  1. Numerous small acute infarcts scattered in the bilateral anterior and posterior circulation, suggesting a recent embolic event from the heart or proximal aorta.  2. No associated hemorrhage or mass effect.  3. Underlying chronic small vessel disease.  4. No abnormal intracranial enhancement or metastatic disease identified.   Mr Jodene Nam Head Wo Contrast 06/19/2017 IMPRESSION:  1. No emergent large vessel occlusion or high-grade stenosis. Mild narrowing of the right middle cerebral artery M1 segment.  2. 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     Transthoracic Echocardiogram  05/09/2017 Study Conclusions - Left ventricle: The cavity size was normal. Wall thickness was   normal. Systolic function was normal. The estimated ejection   fraction was in the range of 60% to 65%. Wall motion was normal;   there were no regional wall motion abnormalities. Doppler   parameters are consistent with abnormal left ventricular   relaxation (grade 1 diastolic dysfunction).    Bilateral Carotid Dopplers - pending 00/00/00     PHYSICAL EXAM Vitals:   06/18/17 2300 06/19/17 0101 06/19/17 0448 06/19/17 0722  BP: (!) 149/64 (!) 148/65 124/69 (!) 124/58  Pulse: 78 71    Resp: 17 18    Temp:  98.8 F (37.1 C) 99.2 F (37.3 C)   TempSrc:  Oral Oral   SpO2: 93% 94%    Weight:  104 lb 8 oz (47.4 kg)    Height:  5'  2" (1.575 m)     Frail pleasant elderly Caucasian lady who is cachectic.  Not in distress. . Afebrile. Head is nontraumatic. Neck is supple without bruit.    Cardiac exam no murmur or gallop. Lungs are clear to auscultation. Distal pulses are well felt. Neurological Exam ;  Awake  Alert oriented x 3. Normal speech and language.eye movements full without nystagmus.fundi were not visualized. Vision acuity and fields appear normal. Hearing is normal. Palatal movements are normal. Face symmetric. Tongue midline. Normal symmetric 4/5 strength, tone, reflexes and coordination. Normal sensation. Gait deferred.    ASSESSMENT/PLAN [name] is a 83 y.o. female with history of pneumonia, hyperlipidemia, recent UTI, cognitive decline times 1 year, hypertension, coronary artery disease, and anxiety presenting with left-sided weakness. She did not receive IV t-PA due to late presentation.  Stroke: Multiple small acute bilateral scattered infarcts likely embolic etiology unknown.  Resultant generalized weakness  CT head - Progressive atrophy since 2018. No acute intracranial findings.   MRI head - Numerous small acute infarcts scattered in the bilateral anterior and posterior circulation, suggesting a recent embolic event from the heart or proximal aorta.   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  Carotid Doppler - pending  2D Echo - 05/09/2017 -unremarkable  LDL - 96  HgbA1c - 5.4  VTE prophylaxis -Lovenox Fall precautions Diet Heart Room service appropriate? Yes; Fluid consistency: Thin  aspirin 81 mg daily prior to admission, now on aspirin 81 mg daily  Patient counseled to be compliant with her antithrombotic medications  Ongoing aggressive stroke risk factor management  Therapy recommendations:  CIR recommended  Disposition:  Pending  Hypertension  Stable  Permissive hypertension (OK if < 220/120) but gradually normalize in 5-7 days  Long-term BP goal  normotensive  Hyperlipidemia  Home meds: No lipid-lowering medications prior to admission  LDL 96, goal < 70  Now on Lipitor 20 mg daily  Continue statin at discharge   Other Stroke Risk Factors  Advanced age  Former cigarette smoker - quit 6 years ago  Coronary artery disease   Other Active Problems  Abnormal chest x-ray consistent with primary bronchogenic neoplasm. Workup in progress.  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   Plan / Recommendations   Check the for marantic endocarditis from suspected lung cancer versus other cardiac source of embolism.  No need to consider loop recorder at the present time as she may not be a long-term candidate for anticoagulation if diagnosis of lung cancer is confirmed  Continue aspirin for now.  Will not add Plavix as she is likely to need lung biopsy.  Question MRI brain with contrast to rule out metastatic disease   Hospital day # 1  I have personally examined this patient, reviewed notes, independently viewed imaging studies, participated in medical decision making and plan of care.ROS completed by me personally and pertinent positives fully documented  I have made any additions or clarifications directly to the above note.  She has presented with by cerebral embolic infarcts etiology indeterminate as to brain metastasis from lung cancer versus marantic endocarditis or cardiac source of embolism.  Recommend check the but no need for loop recorder at the present time unless diagnosis of cancer has been ruled out.  Long discussion with the patient and Dr.Kshitz and answered questions.  Greater than 50% time during this 35-minute visit was spent on counseling and coordination of care about her embolic strokes and plan for evaluation, treatment and discussion. Antony Contras, MD Medical Director [facility] Pager:  [phone] 06/19/2017 3:32 PM   To contact Stroke Continuity provider, please refer to [email or web address] After hours, contact General Neurology

## 2017-06-19 NOTE — Evaluation (Signed)
Physical Therapy Evaluation Patient Details Name: [name] [name] MRN: [id number] DOB: [birth year] Today's Date: 06/19/2017   History of Present Illness  83 y.o.femalewith medical history significant forhypertension and CAD with remote stent, admitted for evaluation of weakness. Patient had reportedly been generally weak for the past month, but was noted to have acute worsening on the left side over the 1-2 days PTA. Patient fell in the bathroom without hitting her head or suffering any significant injury but was noted to have more left leg weakness than right when her husband was helping her up. The next day they came to ED where MRI brain revealed numerous small acute infarcts scattered throughout the bilateral anterior and posterior circulation.     Clinical Impression  Pt admitted with above diagnosis. Pt currently with functional limitations due to the deficits listed below (see PT Problem List). Patient's full participation limited by elevated HR and fatigue (increased up to 145 bpm while on BSC; up to 161 bpm as returning to bed/supine). She has good family support (husband was assisting her PTA and daughter supportive of both parents). She may be able to tolerate CIR level of therapies (especially if HR controlled).  Pt will benefit from skilled PT to increase their independence and safety with mobility to allow discharge to the venue listed below.       Follow Up Recommendations CIR;Supervision/Assistance - 24 hour    Equipment Recommendations  Other (comment)(TBA)    Recommendations for Other Services Rehab consult     Precautions / Restrictions Precautions Precautions: Fall Restrictions Weight Bearing Restrictions: No      Mobility  Bed Mobility Overal bed mobility: Needs Assistance Bed Mobility: Supine to Sit;Sit to Supine     Supine to sit: Mod assist Sit to supine: Min assist   General bed mobility comments: Mod A to elevate trunk adn bring hips towards  EOB.  Transfers Overall transfer level: Needs assistance   Transfers: Sit to/from Stand;Stand Pivot Transfers Sit to Stand: Mod assist Stand pivot transfers: Mod assist;Max assist(to/from BSC)       General transfer comment: on return to bed appeared pt trying to do more of a squat-pivot and did not fully stand  Ambulation/Gait             General Gait Details: unable due to weakness  Stairs            Wheelchair Mobility    Modified Rankin (Stroke Patients Only) Modified Rankin (Stroke Patients Only) Pre-Morbid Rankin Score: Moderately severe disability Modified Rankin: Severe disability     Balance Overall balance assessment: Needs assistance Sitting-balance support: Single extremity supported;Feet supported;Feet unsupported Sitting balance-Leahy Scale: Poor Sitting balance - Comments: slow lean to right as she fatigues; initially can correct without assist progressing to min-mod assist Postural control: Right lateral lean;Posterior lean Standing balance support: Single extremity supported Standing balance-Leahy Scale: Poor Standing balance comment: first sit to stand prior to pivot to [facility], pt using her RUE on therapist's arm/shoulder with upright posture and good weigth-bearing thru LLE                             Pertinent Vitals/Pain Pain Assessment: No/denies pain    Home Living Family/patient expects to be discharged to:: Private residence Living Arrangements: Spouse/significant other Available Help at Discharge: Family Type of Home: House Home Access: Stairs to enter Entrance Stairs-Rails: None Entrance Stairs-Number of Steps: 2 Home Layout: One level Home Equipment: Clinical cytogeneticist -  2 wheels      Prior Function Level of Independence: Needs assistance   Gait / Transfers Assistance Needed: Recently been using RW for functional mobility due to weakness  ADL's / Homemaking Assistance Needed: Husband has been assisting with LB  ADLs recently due to weakness  Comments: Husband and daughter perform IADLs     Hand Dominance   Dominant Hand: Right    Extremity/Trunk Assessment   Upper Extremity Assessment Upper Extremity Assessment: LUE deficits/detail    Lower Extremity Assessment Lower Extremity Assessment: LLE deficits/detail LLE Deficits / Details: functionally LLE grossly : hip extension 3/5, hip flexion 2+, knee extension 3+ (pt having arrhythmia with ?v-tach and RN in to assess as putting pt back to bed, therefore specific testing not completed       Communication   Communication: No difficulties  Cognition Arousal/Alertness: Awake/alert Behavior During Therapy: WFL for tasks assessed/performed                                          General Comments      Exercises     Assessment/Plan    PT Assessment Patient needs continued PT services  PT Problem List Decreased strength;Decreased activity tolerance;Decreased balance;Decreased mobility;Decreased knowledge of use of DME;Cardiopulmonary status limiting activity       PT Treatment Interventions DME instruction;Gait training;Functional mobility training;Therapeutic activities;Therapeutic exercise;Balance training;Neuromuscular re-education;Cognitive remediation;Patient/family education    PT Goals (Current goals can be found in the Care Plan section)  Acute Rehab PT Goals Patient Stated Goal: be able to return home PT Goal Formulation: With patient/family Time For Goal Achievement: 07/03/17 Potential to Achieve Goals: Good    Frequency Min 4X/week   Barriers to discharge        Co-evaluation PT/OT/SLP Co-Evaluation/Treatment: Yes Reason for Co-Treatment: For patient/therapist safety PT goals addressed during session: Mobility/safety with mobility;Balance         AM-PAC PT "6 Clicks" Daily Activity  Outcome Measure Difficulty turning over in bed (including adjusting bedclothes, sheets and blankets)?: A  Little Difficulty moving from lying on back to sitting on the side of the bed? : A Lot Difficulty sitting down on and standing up from a chair with arms (e.g., wheelchair, bedside commode, etc,.)?: A Lot Help needed moving to and from a bed to chair (including a wheelchair)?: A Lot Help needed walking in hospital room?: Total Help needed climbing 3-5 steps with a railing? : Total 6 Click Score: 11    End of Session Equipment Utilized During Treatment: Gait belt Activity Tolerance: Patient limited by fatigue;Other (comment)(HR elevated to 145 sitting on BSC (and 161 on return to bed)) Patient left: in bed;with call bell/phone within reach;with nursing/sitter in room;with family/visitor present Nurse Communication: Mobility status PT Visit Diagnosis: Hemiplegia and hemiparesis Hemiplegia - Right/Left: Left Hemiplegia - dominant/non-dominant: Non-dominant Hemiplegia - caused by: Cerebral infarction    Time: 4696-2952 PT Time Calculation (min) (ACUTE ONLY): 37 min   Charges:   PT Evaluation $PT Eval Low Complexity: 1 Low     PT G Codes:          [employer], PT 06/19/2017, 1:54 PM

## 2017-06-19 NOTE — Progress Notes (Signed)
Patient ID: [name] [name], female   DOB: [birth year], 83 y.o.   MRN: [id number]   Lung mass biopsy request per Dr Starla Link  Reviewed imaging with Dr Annamaria Boots Rec: OP work up after acute stroke hospitalization To include OP PET and Pulmonary consult and/or CT Surgery evaluation  If OP Lung biopsy is deemed appropriate Please re order  Dr Starla Link aware and agreeable

## 2017-06-20 ENCOUNTER — Inpatient Hospital Stay (HOSPITAL_COMMUNITY): Payer: Medicare Other

## 2017-06-20 DIAGNOSIS — I472 Ventricular tachycardia: Secondary | ICD-10-CM

## 2017-06-20 DIAGNOSIS — I4729 Other ventricular tachycardia: Secondary | ICD-10-CM

## 2017-06-20 DIAGNOSIS — I639 Cerebral infarction, unspecified: Secondary | ICD-10-CM

## 2017-06-20 LAB — TSH: TSH: 1.441 u[IU]/mL (ref 0.350–4.500)

## 2017-06-20 LAB — COMPREHENSIVE METABOLIC PANEL
ALBUMIN: 2.6 g/dL — AB (ref 3.5–5.0)
ALK PHOS: 56 U/L (ref 38–126)
ALT: 12 U/L — ABNORMAL LOW (ref 14–54)
ANION GAP: 7 (ref 5–15)
AST: 20 U/L (ref 15–41)
BILIRUBIN TOTAL: 0.4 mg/dL (ref 0.3–1.2)
BUN: 5 mg/dL — ABNORMAL LOW (ref 6–20)
CALCIUM: 8.4 mg/dL — AB (ref 8.9–10.3)
CO2: 25 mmol/L (ref 22–32)
Chloride: 106 mmol/L (ref 101–111)
Creatinine, Ser: 0.88 mg/dL (ref 0.44–1.00)
GFR calc Af Amer: >60 mL/min (ref >60)
GFR calc non Af Amer: >60 mL/min (ref >60)
GLUCOSE: 96 mg/dL (ref 65–99)
POTASSIUM: 4.6 mmol/L (ref 3.5–5.1)
Sodium: 138 mmol/L (ref 135–145)
TOTAL PROTEIN: 5.6 g/dL — AB (ref 6.5–8.1)

## 2017-06-20 LAB — CBC WITH DIFFERENTIAL/PLATELET
BASOS ABS: 0.1 10*3/uL (ref 0.0–0.1)
Basophils Relative: 1 %
EOS ABS: 0.3 10*3/uL (ref 0.0–0.7)
Eosinophils Relative: 3 %
HEMATOCRIT: 35.9 % — AB (ref 36.0–46.0)
HEMOGLOBIN: 11.4 g/dL — AB (ref 12.0–15.0)
LYMPHS PCT: 32 %
Lymphs Abs: 2.8 10*3/uL (ref 0.7–4.0)
MCH: 27.7 pg (ref 26.0–34.0)
MCHC: 31.8 g/dL (ref 30.0–36.0)
MCV: 87.3 fL (ref 78.0–100.0)
MONOS PCT: 13 %
Monocytes Absolute: 1.2 10*3/uL — ABNORMAL HIGH (ref 0.1–1.0)
NEUTROS PCT: 51 %
Neutro Abs: 4.5 10*3/uL (ref 1.7–7.7)
Platelets: 394 10*3/uL (ref 150–400)
RBC: 4.11 MIL/uL (ref 3.87–5.11)
RDW: 14.4 % (ref 11.5–15.5)
WBC: 8.9 10*3/uL (ref 4.0–10.5)

## 2017-06-20 LAB — URINE CULTURE

## 2017-06-20 LAB — MAGNESIUM: Magnesium: 2.3 mg/dL (ref 1.7–2.4)

## 2017-06-20 MED ORDER — SODIUM CHLORIDE 0.9 % IV SOLN: INTRAVENOUS | Status: DC

## 2017-06-20 MED ORDER — ENOXAPARIN SODIUM 30 MG/0.3ML ~~LOC~~ SOLN
30.0000 mg | SUBCUTANEOUS | Status: DC
  Administered 2017-06-21: 30 mg via SUBCUTANEOUS
  Filled 2017-06-20: qty 0.3

## 2017-06-20 MED ORDER — BOOST / RESOURCE BREEZE PO LIQD CUSTOM
1.0000 | Freq: Two times a day (BID) | ORAL | Status: DC
  Administered 2017-06-21 (×2): 1 via ORAL

## 2017-06-20 NOTE — Progress Notes (Signed)
Initial Nutrition Assessment  DOCUMENTATION CODES:   Not applicable  INTERVENTION:    Boost Breeze po BID, each supplement provides 250 kcal and 9 grams of protein  NUTRITION DIAGNOSIS:   Moderate Malnutrition related to chronic illness(CAD) as evidenced by moderate fat depletion, moderate muscle depletion   GOAL:   Patient will meet greater than or equal to 90% of their needs  MONITOR:   PO intake, Supplement acceptance, Labs, Weight trends, Skin  REASON FOR ASSESSMENT:   Malnutrition Screening Tool  ASSESSMENT:   83 yo Female with history of hypertension and CAD with remote stent presented with weakness.  CT of the head was unremarkable.  MRI of the brain revealed numerous small acute infarcts.  Neurology was consulted.  CT of the chest showed left lower lobe mass.  RD spoke with pt's husband at bedside. Pt awake. Husband reports pt's appetite is poor. She had bites of her pancake this am. At home pt typically consumes 2 meals per day:  Breakfast: yogurt with fruit or ham biscuit Supper: starch & vegetable (does not care for meat) Snack: peanut butter crackers  Pt was drinking Ensure at one point, however, recently it's made her "sick". Husband also reveals pt has lost about 10 lbs x 1 month due to recent hospitalizations. He was amenable to RD ordering Boost Breeze.  Labs and medications reviewed. CBG's 104-98.  NUTRITION - FOCUSED PHYSICAL EXAM:    Most Recent Value  Orbital Region  Moderate depletion  Upper Arm Region  Moderate depletion  Thoracic and Lumbar Region  Moderate depletion  Buccal Region  Moderate depletion  Temple Region  Moderate depletion  Clavicle Bone Region  Moderate depletion  Clavicle and Acromion Bone Region  Moderate depletion  Scapular Bone Region  Unable to assess  Dorsal Hand  Unable to assess  Patellar Region  Moderate depletion  Anterior Thigh Region  Moderate depletion  Posterior Calf Region  Moderate depletion  Edema (RD  Assessment)  None     Diet Order:  Fall precautions Diet Heart Room service appropriate? Yes; Fluid consistency: Thin  EDUCATION NEEDS:   Not appropriate for education at this time  Skin:  Skin Assessment: Reviewed RN Assessment  Last BM:  3/17  Height:   Ht Readings from Last 1 Encounters:  06/19/17 5\' 2"  (1.575 m)    Weight:   Wt Readings from Last 1 Encounters:  06/19/17 104 lb 8 oz (47.4 kg)    Ideal Body Weight:  50 kg  BMI:  Body mass index is 19.11 kg/m.  Estimated Nutritional Needs:   Kcal:  1200-1400  Protein:  60-75 gm  Fluid:  >/= 1.5 L  Arthur Holms, RD, LDN Pager #: [phone] After-Hours Pager #: [phone]

## 2017-06-20 NOTE — Progress Notes (Signed)
STROKE TEAM PROGRESS NOTE   HISTORY OF PRESENT ILLNESS (per record) [name] [name] is an 83 y.o. female Roland a PMH of HTN, HLD and CAD who presents with worsening left sided weakness since yesterday. Husband states he first noticed her worsening weakness yesterday when he was helping her after she fell in the bathroom. She sustained no injury and was able to ambulate back to bed but he noticed that her left leg appeared weaker than her Right. The husband reports the patient has been sick with a UTI for over one month. He states she has some mild cognitive decline over the past year. The patient states she has had generalized weakness since she started antibiotics for the UTI.  Patient denies any H/A, visual changes, no CP or SOB. Husband denies noticing any slurred speech or worsening confusion  Date last known well: Date: 06/18/2017 Time last known well: unknown tPA Given: No: outside the window Modified Rankin: Rankin Score=1 NIH Stroke Scale: 2   SUBJECTIVE (INTERVAL HISTORY) Her  Husband is at the bedside.    Both carotid ultrasound and TEE have not yet been done. No neurological changes. No new complaints.   OBJECTIVE Temp:  [96.1 F (35.6 C)-98.7 F (37.1 C)] 98.6 F (37 C) (03/18 1200) Cardiac Rhythm: Normal sinus rhythm (03/18 0700) BP: (123-139)/(52-63) 123/52 (03/18 0359) SpO2:  [93 %] 93 % (03/18 0041)  CBC:  Recent Labs  Lab 06/18/17 1351  06/19/17 1037 06/20/17 0526  WBC 9.5  --  7.9 8.9  NEUTROABS 5.1  --   --  4.5  HGB 12.0   < > 10.9* 11.4*  HCT 36.8   < > 34.3* 35.9*  MCV 86.2  --  86.4 87.3  PLT 429*  --  370 394   < > = values in this interval not displayed.    Basic Metabolic Panel:  Recent Labs  Lab 06/19/17 0552  06/19/17 1037 06/19/17 1313 06/20/17 0526  NA 140  --   --   --  138  K 3.6  --   --  3.4* 4.6  CL 108  --   --   --  106  CO2 23  --   --   --  25  GLUCOSE 94  --   --   --  96  BUN <5*  --   --   --  5*  CREATININE 0.73  --   0.78  --  0.88  CALCIUM 8.3*  --   --   --  8.4*  MG  --    < >  --  2.6* 2.3   < > = values in this interval not displayed.    Lipid Panel:     Component Value Date/Time   CHOL 162 06/19/2017 0552   TRIG 101 06/19/2017 0552   HDL 46 06/19/2017 0552   CHOLHDL 3.5 06/19/2017 0552   VLDL 20 06/19/2017 0552   LDLCALC 96 06/19/2017 0552   HgbA1c:  Lab Results  Component Value Date   HGBA1C 5.4 06/18/2017   Urine Drug Screen: No results found for: LABOPIA, COCAINSCRNUR, LABBENZ, AMPHETMU, THCU, LABBARB  Alcohol Level No results found for: [facility]  IMAGING  Dg Chest 2 View 06/18/2017 IMPRESSION:  1. New mass-like opacity projecting over the lower left hemithorax seen only on the frontal projection. This is of uncertain etiology and significance, and could be infectious/inflammatory or neoplastic, or could alternatively reflect a healing fracture of the anterior aspect of the left fourth  rib. Further evaluation with chest CT (preferably with IV contrast) is recommended in the near future to better evaluate this finding.  2. Aortic atherosclerosis.    Ct Head Wo Contrast 06/18/2017 IMPRESSION:  Chronic changes as described. Progressive atrophy since 2018. No acute intracranial findings.    Ct Chest W Contrast 06/18/2017  IMPRESSION:  1. The finding on the recent chest radiograph corresponds to a 3.0 x 2.2 x 2.0 cm left lower lobe mass which is highly aggressive in appearance, very likely to represent a primary bronchogenic neoplasm. This is intimately associated with the left major fissure, but there is no associated mediastinal or hilar lymphadenopathy noted at this time. Further evaluation with PET-CT is recommended in the near future for additional diagnostic and staging purposes.  2. Multiple tiny 2-3 mm pulmonary nodules scattered throughout the lungs bilaterally, nonspecific and statistically favored to represent tiny areas of mucoid impaction within terminal bronchioles. Attention  on any future follow-up imaging is recommended to ensure stability or regression.  3. Diffuse bronchial wall thickening with mild centrilobular and paraseptal emphysema; imaging findings suggestive of underlying COPD.  4. Aortic atherosclerosis, in addition to left main and 3 vessel coronary artery disease.  5. Multiple splenic infarcts.  6. 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 Aortic Atherosclerosis (ICD10-I70.0) and Emphysema (ICD10-J43.9).     Mr Jeri Cos And Wo Contrast 06/18/2017 IMPRESSION:  1. Numerous small acute infarcts scattered in the bilateral anterior and posterior circulation, suggesting a recent embolic event from the heart or proximal aorta.  2. No associated hemorrhage or mass effect.  3. Underlying chronic small vessel disease.  4. No abnormal intracranial enhancement or metastatic disease identified.   Mr Jodene Nam Head Wo Contrast 06/19/2017 IMPRESSION:  1. No emergent large vessel occlusion or high-grade stenosis. Mild narrowing of the right middle cerebral artery M1 segment.  2. 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     Transthoracic Echocardiogram  05/09/2017 Study Conclusions - Left ventricle: The cavity size was normal. Wall thickness was   normal. Systolic function was normal. The estimated ejection   fraction was in the range of 60% to 65%. Wall motion was normal;   there were no regional wall motion abnormalities. Doppler   parameters are consistent with abnormal left ventricular   relaxation (grade 1 diastolic dysfunction).    Bilateral Carotid Dopplers - pending 00/00/00     PHYSICAL EXAM Vitals:   06/20/17 0041 06/20/17 0359 06/20/17 0807 06/20/17 1200  BP: (!) 128/52 (!) 123/52    Pulse:      Resp:      Temp: (!) 96.1 F (35.6 C) 98 F (36.7 C) 98.7 F (37.1 C) 98.6 F (37 C)  TempSrc: Oral Oral    SpO2: 93%     Weight:      Height:        Frail pleasant elderly Caucasian lady who is cachectic.  Not in distress. . Afebrile. Head is nontraumatic. Neck is supple without bruit.    Cardiac exam no murmur or gallop. Lungs are clear to auscultation. Distal pulses are well felt. Neurological Exam ;  Awake  Alert oriented x 3. Normal speech and language.eye movements full without nystagmus.fundi were not visualized. Vision acuity and fields appear normal. Hearing is normal. Palatal movements are normal. Face symmetric. Tongue midline. Normal symmetric 4/5 strength, tone, reflexes and coordination. Normal sensation. Gait deferred.    ASSESSMENT/PLAN Ms. [name]  [name] is a 83 y.o. female with history of pneumonia, hyperlipidemia, recent UTI, cognitive decline times 1 year, hypertension, coronary artery disease, and anxiety presenting with left-sided weakness. She did not receive IV t-PA due to late presentation.  Stroke: Multiple small acute bilateral scattered infarcts likely embolic etiology unknown.  Resultant generalized weakness  CT head - Progressive atrophy since 2018. No acute intracranial findings.   MRI head - Numerous small acute infarcts scattered in the bilateral anterior and posterior circulation, suggesting a recent embolic event from the heart or proximal aorta.   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  Carotid Doppler - pending  2D Echo - 05/09/2017 -unremarkable  LDL - 96  HgbA1c - 5.4  VTE prophylaxis -Lovenox Fall precautions Diet Heart Room service appropriate? Yes; Fluid consistency: Thin  aspirin 81 mg daily prior to admission, now on aspirin 81 mg daily  Patient counseled to be compliant with her antithrombotic medications  Ongoing aggressive stroke risk factor management  Therapy recommendations:  CIR recommended  Disposition:  Pending  Hypertension  Stable  Permissive hypertension (OK if < 220/120) but gradually normalize in 5-7 days  Long-term BP goal  normotensive  Hyperlipidemia  Home meds: No lipid-lowering medications prior to admission  LDL 96, goal < 70  Now on Lipitor 20 mg daily  Continue statin at discharge   Other Stroke Risk Factors  Advanced age  Former cigarette smoker - quit 6 years ago  Coronary artery disease   Other Active Problems  Abnormal chest x-ray consistent with primary bronchogenic neoplasm. Workup in progress.  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   Plan / Recommendations   Check the for marantic endocarditis from suspected lung cancer versus other cardiac source of embolism.  No need to consider loop recorder at the present time as she may not be a long-term candidate for anticoagulation if diagnosis of lung cancer is confirmed  Continue aspirin for now.  Will not add Plavix as she is likely to need lung biopsy.      Hospital day # 2  I have personally examined this patient, reviewed notes, independently viewed imaging studies, participated in medical decision making and plan of care.ROS completed by me personally and pertinent positives fully documented  I have made any additions or clarifications directly to the above note.  She has presented with by cerebral embolic infarcts etiology indeterminate as to brain metastasis from lung cancer versus marantic endocarditis or cardiac source of embolism.  Recommend check TEE but no need for loop recorder at the present time unless diagnosis of cancer has been ruled out.  Long discussion with the patient and Dr.Kshitz and answered questions.   Antony Contras, MD Medical Director [facility] Pager: [phone] 06/20/2017 3:05 PM   To contact Stroke Continuity provider, please refer to [email or web address] After hours, contact General Neurology

## 2017-06-20 NOTE — Progress Notes (Signed)
Physical Therapy Treatment Patient Details Name: [name] [name] MRN: [id number] DOB: June 21, 1934 Today's Date: 06/20/2017    History of Present Illness 83 y.o.femalewith medical history significant forhypertension and CAD with remote stent, admitted for evaluation of weakness. Patient had reportedly been generally weak for the past month, but was noted to have acute worsening on the left side over the 1-2 days PTA. Patient fell in the bathroom without hitting her head or suffering any significant injury but was noted to have more left leg weakness than right when her husband was helping her up. The next day they came to ED where MRI brain revealed numerous small acute infarcts scattered throughout the bilateral anterior and posterior circulation.     PT Comments    Patient progressing well towards PT goals. Tolerated gait training today with Mod A due to decreased coordination/proprioception LUE/LE and right lateral lean. Pt with some left inattention noted, requires encouragement to use LUE for functional tasks. Great CIR candidate. Will continue to follow and progress as tolerated.     Follow Up Recommendations  CIR;Supervision/Assistance - 24 hour     Equipment Recommendations  None recommended by PT    Recommendations for Other Services       Precautions / Restrictions Precautions Precautions: Fall Precaution Comments: HR Restrictions Weight Bearing Restrictions: No    Mobility  Bed Mobility Overal bed mobility: Needs Assistance Bed Mobility: Rolling;Sidelying to Sit Rolling: Min guard Sidelying to sit: Min assist;HOB elevated       General bed mobility comments: Cues to roll and reach with LUE, able to bring LEs to EOB but assist needed to maintain trunk upright. LOB towards right.  Transfers Overall transfer level: Needs assistance Equipment used: Rolling walker (2 wheeled) Transfers: Sit to/from [facility] Sit to Stand: Mod assist Stand pivot  transfers: Mod assist       General transfer comment: Assist to power to standing from EOB with cues for hand placement x2, from, BSC x1, SPT bed to/from chair with Mod A for sequencing/balance. Transferred to chiair post ambulation.  Ambulation/Gait Ambulation/Gait assistance: Mod assist Ambulation Distance (Feet): 12 Feet Assistive device: Rolling walker (2 wheeled) Gait Pattern/deviations: Step-to pattern;Step-through pattern;Decreased step length - left;Trunk flexed;Narrow base of support;Staggering right Gait velocity: decreased   General Gait Details: Assist for weight shifting and verbal cues to take bigger step through LLE. Decreased proprioception/coordination LUE/LE, Left hand falling off RW. Leaning heavily towards right. Needs step by step cues to sequence.   Stairs            Wheelchair Mobility    Modified Rankin (Stroke Patients Only) Modified Rankin (Stroke Patients Only) Pre-Morbid Rankin Score: Moderately severe disability Modified Rankin: Moderately severe disability     Balance Overall balance assessment: Needs assistance Sitting-balance support: Feet supported;No upper extremity supported Sitting balance-Leahy Scale: Poor Sitting balance - Comments: Initially pt with LOB towards right without externsl support progressing to close Min guard for sitting balance.  Postural control: Right lateral lean;Posterior lean Standing balance support: During functional activity Standing balance-Leahy Scale: Poor Standing balance comment: Relient on BUEs for support in standing and Min-Mod A for dynamic tasks. Total A for pericare.                             Cognition Arousal/Alertness: Awake/alert Behavior During Therapy: WFL for tasks assessed/performed Overall Cognitive Status: History of cognitive impairments - at baseline Area of Impairment: Following commands;Problem solving;Orientation  Orientation Level: Disoriented  to;Time;Situation(Knows it is March)     Following Commands: Follows one step commands with increased time     Problem Solving: Slow processing;Requires verbal cues General Comments: Pt requiring increased time and cues. Repetition needed. Left inattention noted.       Exercises      General Comments General comments (skin integrity, edema, etc.): Spouse and daughter present during session. HR initially in 100s, leads fell off during gait so not able to tell HR.      Pertinent Vitals/Pain Pain Assessment: Faces Faces Pain Scale: No hurt    Home Living     Available Help at Discharge: Family Type of Home: House              Prior Function            PT Goals (current goals can now be found in the care plan section) Progress towards PT goals: Progressing toward goals    Frequency    Min 4X/week      PT Plan Current plan remains appropriate    Co-evaluation              AM-PAC PT "6 Clicks" Daily Activity  Outcome Measure  Difficulty turning over in bed (including adjusting bedclothes, sheets and blankets)?: None Difficulty moving from lying on back to sitting on the side of the bed? : Unable Difficulty sitting down on and standing up from a chair with arms (e.g., wheelchair, bedside commode, etc,.)?: Unable Help needed moving to and from a bed to chair (including a wheelchair)?: A Lot Help needed walking in hospital room?: A Lot Help needed climbing 3-5 steps with a railing? : Total 6 Click Score: 11    End of Session Equipment Utilized During Treatment: Gait belt Activity Tolerance: Patient tolerated treatment well Patient left: in chair;with call bell/phone within reach;with chair alarm set;with family/visitor present Nurse Communication: Mobility status PT Visit Diagnosis: Hemiplegia and hemiparesis Hemiplegia - Right/Left: Left Hemiplegia - dominant/non-dominant: Non-dominant Hemiplegia - caused by: Cerebral infarction     Time:  1412-1440 PT Time Calculation (min) (ACUTE ONLY): 28 min  Charges:  $Gait Training: 8-22 mins $Neuromuscular Re-education: 8-22 mins                    G Codes:       Wray Kearns, PT, DPT [place] 06/20/2017, 2:58 PM

## 2017-06-20 NOTE — Evaluation (Addendum)
Speech Language Pathology Evaluation Patient Details Name: [name] [name] MRN: [id number] DOB: Feb 22, 1935 Today's Date: 06/20/2017 Time: 2831-5176 SLP Time Calculation (min) (ACUTE ONLY): 22 min  Problem List:  Patient Active Problem List   Diagnosis Date Noted  . NSVT (nonsustained ventricular tachycardia) ([place])   . Acute embolic stroke ([place]) 16/10/3708  . Hypokalemia 06/18/2017  . Ischemic stroke ([place]) 06/18/2017  . Dehydration 05/08/2017  . Nausea & vomiting 05/08/2017  . Leukocytosis   . CAP (community acquired pneumonia) 08/23/2014  . Leg pain 09/15/2011  . Carotid bruit 09/15/2011  . Mass of lower lobe of left lung 08/16/2011  . HYPERCHOLESTEROLEMIA 01/07/2010  . ANXIETY 01/07/2010  . Essential hypertension 01/07/2010  . CAD 01/07/2010   Past Medical History:  Past Medical History:  Diagnosis Date  . Anxiety    pt denies  . Arthritis    back, legs  . CAD (coronary artery disease) 2008   s/p Taxus Element Perseus Study stent (DES) to OM1 in 08/2006; EF 65%  . HTN (hypertension)   . Hypercholesterolemia   . Pneumonia 5/16   hospitalized for 4 days   Past Surgical History:  Past Surgical History:  Procedure Laterality Date  . APPENDECTOMY  1950  . CORONARY ANGIOPLASTY WITH STENT PLACEMENT    . GANGLION CYST EXCISION Right 12/26/2014   Procedure: EXCISON OF RIGHT FOOT GANGLION CYST;  Surgeon: Wylene Simmer, MD;  Location: [place];  Service: Orthopedics;  Laterality: Right;  . lower back surgery  1976  . NECK SURGERY  1990  . PARTIAL HYSTERECTOMY  1977  . PCI of hte lesion in th marginal branch circumflex artery     HPI:  [name] [name] a 83 y.o.right handedfemalewith history of hypertension, CAD with stenting maintained on aspirin, remote tobacco abuse.Patient lives with spouse. She had recently been treated for UTI last month. Family also noted a mild cognitive decline over the past year. Presented 06/18/2017 with left side weakness 1-2 days as  well as a recent fall in the bathroom. Cranial CT scan showed no acute intracranial abnormalities. Progressive atrophy since 2018. Patient did not receive TPA. MRI showed numerous small acute infarcts scattered in the bilateral anterior and posterior circulation. MRA of the head with no emergent large vessel occlusion or stenosis.   Assessment / Plan / Recommendation Clinical Impression    Pt presents with baseline cognitive deficits that are consistent with deficits revealed in today's evaluation, as reported by pt's husband who was present for testing. Pt's husband reports change in pt's cognition following trauma in January 2018 and UTI earlier this year, but that cognition was not an acute changes for current admission. Pt likely to benefit from additional support at next level of care for compensatory strategies for pt to use to circumvent evolving baseline deficits. However, given non-acute nature of cognitive status no further acute therapy warranted at this time. Please re-consult in the event of acute changes.     SLP Assessment  SLP Recommendation/Assessment: All further Speech Lanaguage Pathology  needs can be addressed in the next venue of care SLP Visit Diagnosis: Cognitive communication deficit (R41.841)    Follow Up Recommendations  Inpatient Rehab    Frequency and Duration           SLP Evaluation Cognition  Overall Cognitive Status: History of cognitive impairments - at baseline Arousal/Alertness: Awake/alert Orientation Level: Oriented to place;Oriented to person Attention: Sustained Sustained Attention: Impaired Sustained Attention Impairment: Verbal basic;Functional basic Memory: Impaired Memory Impairment: Decreased recall of  new information Awareness: Impaired Awareness Impairment: Intellectual impairment Executive Function: Reasoning;Self Correcting;Organizing;Sequencing Reasoning: Impaired Reasoning Impairment: Verbal basic Sequencing: Impaired Sequencing  Impairment: Verbal basic Organizing: Impaired Organizing Impairment: Verbal basic Self Correcting: Impaired Self Correcting Impairment: Verbal basic Safety/Judgment: Impaired       Comprehension  Auditory Comprehension Overall Auditory Comprehension: Appears within functional limits for tasks assessed Commands: Within Functional Limits Conversation: Simple    Expression Expression Primary Mode of Expression: Verbal Verbal Expression Overall Verbal Expression: Impaired at baseline Initiation: No impairment Level of Generative/Spontaneous Verbalization: Conversation Naming: Impairment Confrontation: Impaired Convergent: 75-100% accurate Pragmatics: No impairment Interfering Components: Attention Effective Techniques: Semantic cues Non-Verbal Means of Communication: Not applicable Written Expression Dominant Hand: Right Written Expression: Within Functional Limits   Oral / Motor  Motor Speech Overall Motor Speech: Appears within functional limits for tasks assessed Respiration: Within functional limits Phonation: Normal Resonance: Within functional limits Articulation: Within functional limitis Intelligibility: Intelligible Motor Planning: Witnin functional limits Motor Speech Errors: Not applicable Interfering Components: Premorbid status   GO            [place] Magdalynn Davilla SLP Student Clinician          [place] Kaelen Caughlin 06/20/2017, 2:14 PM

## 2017-06-20 NOTE — Consult Note (Signed)
Cardiology Consultation:   Patient ID: [name] [name]; [id number]; October 21, 1934   Admit date: 06/18/2017 Date of Consult: 06/20/2017  Primary Care Provider: Leonard Downing, MD Primary Cardiologist: Jenkins Rouge, MD  Primary Electrophysiologist:     Patient Profile:   [name] [name] is a 83 y.o. female with a hx of CAD with stent in OM (2008), normal myoview 07/2014, HTN, and carotid disease (03-50% LICA) who is being seen today for the evaluation of NSVT at the request of Dr. Starla Link.  History of Present Illness:   [name] [name] was recently seen by cardiology in consultation on 05/09/17 for elevated troponin. In the absence of anginal symptoms and normal echo, no further ischemic evaluation was planned and she was discharged.   She presented to [facility] with left-sided weakness and codes stroke was initiated. Head CT without acute findings, but MRI brain with several scattered small acute infarcts in the bilateral anterior and posterior circulation. Per neurology, differential includes brain mets from possible lung cancer vs endocarditis vs embolic event from the heart or proximal aorta.   Cardiology was consulted for several runs of NSVT on telemetry. She has a TEE scheduled for tomorrow at 0900 with Dr. Acie Fredrickson.  While working with PT yesterday, her heart rate increased to the 160s and appeared to be NSVT on telemetry. This lasted for approximately 1 minute. After returning to bed, her heart rate again increased to the 140s and appeared to be NSVT on telemetry. This lasted for about a minute before self-resolving. On my interview, the patient denies chest pain, shortness of breath, palpitations, heart racing, dizziness, and feelings of syncope. She recalls no symptoms associated with her rapid heart rate yesterday.   Past Medical History:  Diagnosis Date  . Anxiety    pt denies  . Arthritis    back, legs  . CAD (coronary artery disease) 2008   s/p Taxus Element Perseus Study stent (DES) to OM1  in 08/2006; EF 65%  . HTN (hypertension)   . Hypercholesterolemia   . Pneumonia 5/16   hospitalized for 4 days    Past Surgical History:  Procedure Laterality Date  . APPENDECTOMY  1950  . CORONARY ANGIOPLASTY WITH STENT PLACEMENT    . GANGLION CYST EXCISION Right 12/26/2014   Procedure: EXCISON OF RIGHT FOOT GANGLION CYST;  Surgeon: Wylene Simmer, MD;  Location: [place];  Service: Orthopedics;  Laterality: Right;  . lower back surgery  1976  . NECK SURGERY  1990  . PARTIAL HYSTERECTOMY  1977  . PCI of hte lesion in th marginal branch circumflex artery       Home Medications:  Prior to Admission medications   Medication Sig Start Date End Date Taking? Authorizing Provider  aspirin 81 MG tablet Take 81 mg by mouth 2 (two) times a week.    Yes [provider]  EPINEPHrine (EPIPEN 2-PAK) 0.3 mg/0.3 mL IJ SOAJ injection Inject 0.3 mg into the muscle once as needed (for anaphylaxis).   Yes [provider]  felodipine (PLENDIL) 10 MG 24 hr tablet Take 10 mg by mouth daily.     Yes [provider]  Multiple Vitamin (MULTIVITAMIN WITH MINERALS) TABS tablet Take 1 tablet by mouth daily. 05/11/17  Yes Georgette Shell, MD  nitroGLYCERIN (NITROSTAT) 0.4 MG SL tablet Place 1 tablet (0.4 mg total) under the tongue every 5 (five) minutes as needed for chest pain. 07/10/14  Yes Josue Hector, MD  ondansetron (ZOFRAN ODT) 4 MG disintegrating tablet Take 1  tablet (4 mg total) by mouth every 8 (eight) hours as needed for nausea or vomiting. Patient not taking: Reported on 06/18/2017 06/02/17   Pixie Casino, MD    Inpatient Medications: Scheduled Meds: . aspirin EC  81 mg Oral Daily  . aspirin EC  81 mg Oral Daily  . atorvastatin  20 mg Oral q1800  . enoxaparin (LOVENOX) injection  40 mg Subcutaneous Q24H  . multivitamin with minerals  1 tablet Oral Daily   Continuous Infusions:  PRN Meds: acetaminophen **OR** acetaminophen (TYLENOL) oral liquid 160  mg/5 mL **OR** acetaminophen, senna-docusate  Allergies:    Allergies  Allergen Reactions  . Other Anaphylaxis    Food or preservative allergy of unknown/undetermined origin (happened last in 2017 when the patient had gone out to eat and hadn't yet touched her steak??)    Social History:   Social History   Socioeconomic History  . Marital status: Married    Spouse name: Not on file  . Number of children: Not on file  . Years of education: Not on file  . Highest education level: Not on file  Social Needs  . Financial resource strain: Not on file  . Food insecurity - worry: Not on file  . Food insecurity - inability: Not on file  . Transportation needs - medical: Not on file  . Transportation needs - non-medical: Not on file  Occupational History  . Not on file  Tobacco Use  . Smoking status: Former Smoker    Packs/day: 1.00    Years: 40.00    Pack years: 40.00    Types: Cigarettes    Last attempt to quit: 06/13/2011    Years since quitting: 6.0  . Smokeless tobacco: Never Used  Substance and Sexual Activity  . Alcohol use: No  . Drug use: No  . Sexual activity: Not on file  Other Topics Concern  . Not on file  Social History Narrative  . Not on file    Family History:    Family History  Problem Relation Age of Onset  . Heart failure Mother   . Hypertension Mother      ROS:  Please see the history of present illness.   All other ROS reviewed and negative.     Physical Exam/Data:   Vitals:   06/19/17 2100 06/20/17 0041 06/20/17 0359 06/20/17 0807  BP: 139/63 (!) 128/52 (!) 123/52   Pulse:      Resp:      Temp: 97.8 F (36.6 C) (!) 96.1 F (35.6 C) 98 F (36.7 C) 98.7 F (37.1 C)  TempSrc: Oral Oral Oral   SpO2:  93%    Weight:      Height:        Intake/Output Summary (Last 24 hours) at 06/20/2017 1154 Last data filed at 06/20/2017 0500 Gross per 24 hour  Intake 180 ml  Output -  Net 180 ml   Filed Weights   06/18/17 1339 06/19/17 0101    Weight: 116 lb (52.6 kg) 104 lb 8 oz (47.4 kg)   Body mass index is 19.11 kg/m.  General:  Frail, well developed, in no acute distress HEENT: normal Lymph: no adenopathy Neck: no JVD Endocrine:  No thryomegaly Vascular: No carotid bruits; FA pulses 2+ bilaterally without bruits  Cardiac:  normal S1, S2; RRR; no murmur  Lungs:  clear to auscultation bilaterally, no wheezing, rhonchi or rales  Abd: soft, nontender, no hepatomegaly  Ext: no edema Musculoskeletal:  No deformities, BUE  and BLE strength normal and equal Skin: warm and dry  Neuro:  Mild weakness LLE and LUE Psych:  Normal affect   EKG:  The EKG was personally reviewed and demonstrates:  sinus Telemetry:  Telemetry was personally reviewed and demonstrates:  Sinus, occasional PVC  Relevant CV Studies:  Echo 05/09/17: Study Conclusions - Left ventricle: The cavity size was normal. Wall thickness was   normal. Systolic function was normal. The estimated ejection   fraction was in the range of 60% to 65%. Wall motion was normal;   there were no regional wall motion abnormalities. Doppler   parameters are consistent with abnormal left ventricular   relaxation (grade 1 diastolic dysfunction).   Laboratory Data:  Chemistry Recent Labs  Lab 06/18/17 1351 06/18/17 1402 06/19/17 0552 06/19/17 1037 06/19/17 1313 06/20/17 0526  NA 138 142 140  --   --  138  K 2.8* 2.9* 3.6  --  3.4* 4.6  CL 103 103 108  --   --  106  CO2 25  --  23  --   --  25  GLUCOSE 108* 103* 94  --   --  96  BUN 5* 3* <5*  --   --  5*  CREATININE 0.83 0.70 0.73 0.78  --  0.88  CALCIUM 8.3*  --  8.3*  --   --  8.4*  GFRNONAA >60  --  >60 >60  --  >60  GFRAA >60  --  >60 >60  --  >60  ANIONGAP 10  --  9  --   --  7    Recent Labs  Lab 06/18/17 1351 06/20/17 0526  PROT 6.0* 5.6*  ALBUMIN 2.8* 2.6*  AST 23 20  ALT 12* 12*  ALKPHOS 61 56  BILITOT 0.6 0.4   Hematology Recent Labs  Lab 06/18/17 1351 06/18/17 1402 06/19/17 1037  06/20/17 0526  WBC 9.5  --  7.9 8.9  RBC 4.27  --  3.97 4.11  HGB 12.0 12.2 10.9* 11.4*  HCT 36.8 36.0 34.3* 35.9*  MCV 86.2  --  86.4 87.3  MCH 28.1  --  27.5 27.7  MCHC 32.6  --  31.8 31.8  RDW 13.9  --  14.3 14.4  PLT 429*  --  370 394   Cardiac EnzymesNo results for input(s): TROPONINI in the last 168 hours.  Recent Labs  Lab 06/18/17 1400  TROPIPOC 0.04    BNPNo results for input(s): BNP, PROBNP in the last 168 hours.  DDimer No results for input(s): DDIMER in the last 168 hours.  Radiology/Studies:  Dg Chest 2 View  Result Date: 06/18/2017 CLINICAL DATA:  83 year old female with history of progressive weakness for 1 month. EXAM: CHEST - 2 VIEW COMPARISON:  Chest x-ray 05/08/2017. FINDINGS: Lung volumes are normal. In the lower left hemithorax there is a 3.0 x 2.6 cm mass-like opacity, which is not confidently identified on lateral projection. This appears intimately associated with the anterior aspect of the left fourth rib, and could simply reflect a healing rib fracture, however, the possibility of a primary pulmonary lesion is not excluded. Alternatively, this could reflect in rounded area of pneumonia. No other consolidative airspace disease noted. No pleural effusions. No evidence of pulmonary edema. Heart size is normal. Upper mediastinal contours are within normal limits. Aortic atherosclerosis. IMPRESSION: 1. New mass-like opacity projecting over the lower left hemithorax seen only on the frontal projection. This is of uncertain etiology and significance, and could be infectious/inflammatory or  neoplastic, or could alternatively reflect a healing fracture of the anterior aspect of the left fourth rib. Further evaluation with chest CT (preferably with IV contrast) is recommended in the near future to better evaluate this finding. 2. Aortic atherosclerosis. Electronically Signed   By: Vinnie Langton M.D.   On: 06/18/2017 16:41   Ct Head Wo Contrast  Result Date:  06/18/2017 CLINICAL DATA:  Complaints of progressive weakness.  Recent UTI. EXAM: CT HEAD WITHOUT CONTRAST TECHNIQUE: Contiguous axial images were obtained from the base of the skull through the vertex without intravenous contrast. COMPARISON:  05/01/2016. FINDINGS: Brain: No evidence for acute infarction, hemorrhage, mass lesion, hydrocephalus, or extra-axial fluid. Generalized atrophy. Chronic microvascular ischemic change. Chronic RIGHT basal ganglia lacunar infarct. Vascular: Calcification of the cavernous internal carotid arteries consistent with cerebrovascular atherosclerotic disease. No signs of intracranial large vessel occlusion. Skull: Normal. Negative for fracture or focal lesion. Sinuses/Orbits: No acute finding. Other: None. IMPRESSION: Chronic changes as described. Progressive atrophy since 2018. No acute intracranial findings. Electronically Signed   By: Staci Righter M.D.   On: 06/18/2017 15:17   Ct Chest W Contrast  Result Date: 06/18/2017 CLINICAL DATA:  83 year old female with history of abnormal chest x-ray suspicious for pulmonary mass. EXAM: CT CHEST WITH CONTRAST TECHNIQUE: Multidetector CT imaging of the chest was performed during intravenous contrast administration. CONTRAST:  75 mL ISOVUE-300 IOPAMIDOL (ISOVUE-300) INJECTION 61% COMPARISON:  Chest CT 08/19/2011. FINDINGS: Cardiovascular: Heart size is normal. There is no significant pericardial fluid, thickening or pericardial calcification. There is aortic atherosclerosis, as well as atherosclerosis of the great vessels of the mediastinum and the coronary arteries, including calcified atherosclerotic plaque in the left main, left anterior descending, left circumflex and right coronary arteries. Mediastinum/Nodes: No pathologically enlarged mediastinal or hilar lymph nodes. Esophagus is unremarkable in appearance. No axillary lymphadenopathy. Lungs/Pleura: New left lower lobe mass measuring 3.0 x 2.2 x 2.0 cm (coronal image 44 of  series 5 and axial image 105 of series 7), which has macrolobulated slightly spiculated margins, and is in contact with the right major fissure which is slightly retracted toward the lesion. Tiny right upper lobe calcified granuloma. A few scattered tiny 2-3 mm pulmonary nodules throughout the lungs bilaterally. No acute consolidative airspace disease. No pleural effusions. Diffuse bronchial wall thickening with mild centrilobular and paraseptal emphysema. Upper Abdomen: Irregular areas of hypoattenuation throughout the spleen, new compared to the prior examination, likely to reflect multifocal splenic infarctions. Small calcified granuloma in the liver. 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 Musculoskeletal: Chronic appearing compression fractures of T6 and L1, most severe at T6 where there is 30% loss of anterior vertebral body height. There are no aggressive appearing lytic or blastic lesions noted in the visualized portions of the skeleton. IMPRESSION: 1. The finding on the recent chest radiograph corresponds to a 3.0 x 2.2 x 2.0 cm left lower lobe mass which is highly aggressive in appearance, very likely to represent a primary bronchogenic neoplasm. This is intimately associated with the left major fissure, but there is no associated mediastinal or hilar lymphadenopathy noted at this time. Further evaluation with PET-CT is recommended in the near future for additional diagnostic and staging purposes. 2. Multiple tiny 2-3 mm pulmonary nodules scattered throughout the lungs bilaterally, nonspecific and statistically favored to represent tiny areas of mucoid impaction within terminal bronchioles. Attention on any future follow-up imaging is recommended to ensure stability or regression. 3. Diffuse bronchial wall thickening with mild centrilobular and paraseptal  emphysema; imaging findings suggestive of underlying COPD. 4. Aortic atherosclerosis, in addition to left main and 3 vessel  coronary artery disease. 5. Multiple splenic infarcts. 6. 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 Aortic Atherosclerosis (ICD10-I70.0) and Emphysema (ICD10-J43.9). Electronically Signed   By: Vinnie Langton M.D.   On: 06/18/2017 20:26   Mr Jeri Cos And Wo Contrast  Result Date: 06/18/2017 CLINICAL DATA:  83 year old female with vomiting for 2 days. Left side weakness onset yesterday. Possible left lung mass. EXAM: MRI HEAD WITHOUT AND WITH CONTRAST TECHNIQUE: Multiplanar, multiecho pulse sequences of the brain and surrounding structures were obtained without and with intravenous contrast. CONTRAST:  41mL MULTIHANCE GADOBENATE DIMEGLUMINE 529 MG/ML IV SOLN COMPARISON:  Head CT without contrast 1429 hours today, 05/01/2016. FINDINGS: Brain: There are numerous small foci of restricted diffusion scattered in both cerebral and cerebellar hemispheres. The largest areas of involvement are in the posterior right cerebellum (series 3, image 8), left cerebellar peduncle (image 12), and posterior right insula (image 24). Bilateral anterior and posterior vascular territories are affected. The deep gray matter nuclei are spared. Mild associated T2 and FLAIR hyperintensity at the larger areas of involvement. No associated hemorrhage or mass effect. No associated abnormal enhancement. Underlying widespread and confluent bilateral cerebral white matter T2 and FLAIR hyperintensity. Underlying chronic lacunar infarcts of the right basal ganglia. No chronic cerebral blood products identified. No midline shift, mass effect, evidence of mass lesion, ventriculomegaly, extra-axial collection or acute intracranial hemorrhage. Cervicomedullary junction and pituitary are within normal limits. No dural thickening. Vascular: Major intracranial vascular flow voids are preserved. The major dural venous sinuses are enhancing and appear patent. Skull and upper cervical spine: Lower cervical spine disc and endplate  degeneration with up to mild degenerative spinal stenosis. Normal bone marrow signal. Sinuses/Orbits: Normal orbits soft tissues. The paranasal sinuses are clear. Other: Trace mastoid air cell fluid. Negative nasopharynx. Visible internal auditory structures appear normal. Negative orbit and scalp soft tissues. IMPRESSION: 1. Numerous small acute infarcts scattered in the bilateral anterior and posterior circulation, suggesting a recent embolic event from the heart or proximal aorta. 2. No associated hemorrhage or mass effect. 3. Underlying chronic small vessel disease. 4. No abnormal intracranial enhancement or metastatic disease identified. Electronically Signed   By: Genevie Ann M.D.   On: 06/18/2017 18:43   Ct Abdomen Pelvis W Contrast  Result Date: 06/19/2017 CLINICAL DATA:  83 year old female with newly diagnosed left lower lobe mass. 20-30 pound of recent weight loss. Staging examination. EXAM: CT ABDOMEN AND PELVIS WITH CONTRAST TECHNIQUE: Multidetector CT imaging of the abdomen and pelvis was performed using the standard protocol following bolus administration of intravenous contrast. CONTRAST:  150mL ISOVUE-300 IOPAMIDOL (ISOVUE-300) INJECTION 61% COMPARISON:  No priors. FINDINGS: Lower chest: No change compared to yesterday's chest CT (see report from 06/18/2017). Hepatobiliary: No suspicious cystic or solid hepatic lesions. No intra or extrahepatic biliary ductal dilatation. Tiny partially calcified gallstones lying dependently in the gallbladder. No findings to suggest an acute cholecystitis at this time. Pancreas: No pancreatic mass. No pancreatic ductal dilatation. No pancreatic or peripancreatic fluid or inflammatory changes. Spleen: Multiple linear areas of low attenuation throughout the spleen, most compatible with areas of splenic infarctions. Adrenals/Urinary Tract: 8 mm low-attenuation lesion in the lower pole the left kidney, and 4 mm low-attenuation lesion in the upper pole the right kidney  are too small to characterize, but statistically likely tiny cysts. Mild atrophy of the left kidney. Bilateral adrenal glands are normal in  appearance. No hydroureteronephrosis. Urinary bladder is normal in appearance. Stomach/Bowel: The appearance of the stomach is normal. No pathologic dilatation of small bowel or colon. The appendix is not confidently identified and may be surgically absent. Regardless, there are no inflammatory changes noted adjacent to the cecum to suggest the presence of an acute appendicitis at this time. Vascular/Lymphatic: Aortic atherosclerosis, with aneurysmal dilatation of the celiac axis which measures up to 17 mm in diameter. No lymphadenopathy noted in the abdomen or pelvis. Reproductive: Status post hysterectomy. Ovaries are not confidently identified may be surgically absent or atrophic. Other: No significant volume of ascites.  No pneumoperitoneum. Musculoskeletal: Chronic appearing compression fracture of L1 with 30% loss of anterior vertebral body height. There are no aggressive appearing lytic or blastic lesions noted in the visualized portions of the skeleton. IMPRESSION: 1. No definite evidence of metastatic disease noted in the abdomen or pelvis. 2. Multiple splenic infarcts, new compared to prior study from 08/19/2011. 3. 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 4. Mild atrophy of the left kidney. 5. Cholelithiasis without evidence of acute cholecystitis at this time. 6. Additional incidental findings, as above. Electronically Signed   By: Vinnie Langton M.D.   On: 06/19/2017 21:53   Mr Jodene Nam Head Wo Contrast  Result Date: 06/19/2017 CLINICAL DATA:  Stroke follow-up EXAM: MRA HEAD WITHOUT CONTRAST TECHNIQUE: Angiographic images of the Circle of Willis were obtained using MRA technique without intravenous contrast. COMPARISON:  Brain MRI 06/18/2017 FINDINGS: Intracranial internal carotid arteries: Small medially projecting  aneurysm from the distal cavernous segment of the left internal carotid artery, measuring approximately 3 x 2 mm. Normal right internal carotid artery. Anterior cerebral arteries: Normal. Middle cerebral arteries: Mild stenosis of the right middle cerebral artery M1 segment. Otherwise normal middle cerebral arteries. Posterior communicating arteries: Present bilaterally. Posterior cerebral arteries: Normal. Basilar artery: Normal. Vertebral arteries: Left dominant. Normal. Superior cerebellar arteries: Normal. Anterior inferior cerebellar arteries: Normal. Posterior inferior cerebellar arteries: Normal. IMPRESSION: 1. No emergent large vessel occlusion or high-grade stenosis. Mild narrowing of the right middle cerebral artery M1 segment. 2. 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 Electronically Signed   By: Ulyses Jarred M.D.   On: 06/19/2017 00:57    Assessment and Plan:   Rapid heart rate reported during PT No telemetry evidence or cardiac strips reported in chart of NSVT. EKG today with NSR. Patient was asymptomatic during episodes of elevated HR yesterday. Would proceed with TEE tomorrow to look for cardiac source of embolic stroke. Would also recommend loop recorder. Its possible that this elevated heart rate was an arrhythmia, such as Afib/flutter. Would need to rule this out as a potential source of stroke. Pt and family agree to proceed with TEE and loop tomorrow. TSH pending. Mg and K WNL.  Grade 1 diastolic dysfunction Pt is euvolemic on exam. No evidence of congestive heart failure.  HTN Permissive hypertension per neurology.   For questions or updates, please contact [place] Please consult [email or web address] for contact info under Cardiology/STEMI.   Signed, Tami Lin [place], PA  06/20/2017 11:54 AM As above, patient seen and examined.  Briefly she is an 83 year old female with past medical history of coronary  artery disease, hypertension, carotid artery disease for evaluation of nonsustained ventricular tachycardia and recent CVA.  Patient admitted with left-sided weakness.  Echocardiogram February 2019 showed normal LV function.  MRI showed scattered small acute  infarcts in the bilateral anterior and posterior circulation.  MRA showed no large vessel occlusion or high-grade stenosis.  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  Chest CT showed left lower lobe mass suggestive of bronchogenic neoplasm.  There was COPD and note of multiple splenic infarcts.  Patient reportedly had runs of nonsustained ventricular tachycardia on telemetry.  However no strips are available to review and I find no documentation of this in reviewing her monitor.  Patient denies dyspnea, chest pain, palpitations or syncope. Electrocardiogram shows sinus rhythm with no ST changes.  1 CVA-patient is noted to have possible small acute embolic events on her MRI.  There also multiple splenic infarcts noted on her abdominal CT.  We will arrange a transesophageal echocardiogram to rule out cardiac source of embolus.  Ordinarily we would consider proceeding with implantable loop monitor to exclude atrial arrhythmias.  However she has a left lower lobe mass concerning for malignancy.  It would be worthwhile to understand definitive diagnosis of this mass and prognosis before proceeding with additional cardiac evaluation.  2 Question nonsustained ventricular tachycardia-I have reviewed patient's telemetry, save strips and electrocardiograms personally.  I cannot find documentation of nonsustained ventricular tachycardia.  Regardless her LV function is normal and she has not had palpitations or syncope.  We will continue to monitor on telemetry but no additional therapy indicated at this point.  3 left lower lobe lung mass-concerning for malignancy.  Further evaluation per primary service.  Kirk Ruths, MD

## 2017-06-20 NOTE — Progress Notes (Signed)
    [facility] has been requested to perform a transesophageal echocardiogram on [name] [name] for stroke.  After careful review of history and examination, the risks and benefits of transesophageal echocardiogram have been explained including risks of esophageal damage, perforation (1:10,000 risk), bleeding, pharyngeal hematoma as well as other potential complications associated with conscious sedation including aspiration, arrhythmia, respiratory failure and death. Alternatives to treatment were discussed, questions were answered. Patient is willing to proceed.   Pt is scheduled for TEE tomorrow at 11:00AM with Dr. Acie Fredrickson. NPO at [place] please.  Tami Lin Duke, [place]  06/20/2017 1:46 PM

## 2017-06-20 NOTE — Consult Note (Signed)
Physical Medicine and Rehabilitation Consult Reason for Consult:Left side weakness Referring Physician: [facility]   HPI: [name] [name] is a 83 y.o.right handed female with history of hypertension, CAD with stenting maintained on aspirin, remote tobacco abuse.Per chart review patient lives with spouse. One level home with 2 steps to entry. Recently using a rolling walker for functional mobility due to weakness. Husband had been assisting with lower body ADLs.Daughter in the area work.She had recently been treated for UTI last month. Family also noted a mild cognitive decline over the past year. Presented 06/18/2017 withleft side weakness 1-2 days as well as a recent fall in the bathroom. Cranial CT scan showed no acute intracranial abnormalities. Progressive atrophy since 2018. Patient did not receive TPA. MRI showed numerous small acute infarcts scattered in the bilateral anterior and posterior circulation .MRA of the head with no emergent large vessel occlusion or stenosis. 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 CT of the chest showed 3.0 by 2.2 x 2.0 cm left lower lobe mass as well as multiple tiny 2-3 mm pulmonary nodules scattered throughout the lungs bilaterally.Echocardiogram with ejection fraction 40% grade 1 diastolic dysfunction.Neurology follow-up currently on aspirin for CVA prophylaxis. Subcutaneous Lovenox for DVT prophylaxis. In regards to left lung Mass plan is for outpatient PET and pulmonary consult as well as lung biopsy to be completed as outpatient. Physical and occupational therapy evaluations completed with recommendations of physical medicine rehabilitation consult.   Review of Systems  Constitutional: Negative for chills and fever.  HENT: Negative for hearing loss.   Eyes: Negative for blurred  vision and double vision.  Respiratory: Negative for cough and shortness of breath.   Cardiovascular: Negative for chest pain and leg swelling.  Gastrointestinal: Positive for constipation. Negative for nausea.  Genitourinary: Negative for dysuria, flank pain and hematuria.  Musculoskeletal: Positive for joint pain and myalgias.  Skin: Negative for rash.  Neurological: Positive for weakness. Negative for seizures.  Psychiatric/Behavioral:       Anxiety  All other systems reviewed and are negative.  Past Medical History:  Diagnosis Date  . Anxiety    pt denies  . Arthritis    back, legs  . CAD (coronary artery disease) 2008   s/p Taxus Element Perseus Study stent (DES) to OM1 in 08/2006; EF 65%  . HTN (hypertension)   . Hypercholesterolemia   . Pneumonia 5/16   hospitalized for 4 days   Past Surgical History:  Procedure Laterality Date  . APPENDECTOMY  1950  . CORONARY ANGIOPLASTY WITH STENT PLACEMENT    . GANGLION CYST EXCISION Right 12/26/2014   Procedure: EXCISON OF RIGHT FOOT GANGLION CYST;  Surgeon: Wylene Simmer, MD;  Location: [facility];  Service: Orthopedics;  Laterality: Right;  . lower back surgery  1976  . NECK SURGERY  1990  . PARTIAL HYSTERECTOMY  1977  . PCI of hte lesion in th marginal branch circumflex artery     Family History  Problem Relation Age of Onset  . Heart failure Mother   . Hypertension Mother    Social History:  reports that she quit smoking about 6 years ago. Her smoking use included cigarettes. She has a 40.00 pack-year smoking history. she has never used smokeless tobacco. She reports that she does not drink alcohol or use drugs. Allergies:  Allergies  Allergen Reactions  . Other Anaphylaxis    Food or preservative allergy of unknown/undetermined origin (happened last in 2017 when the patient had gone out to eat and hadn't yet touched her steak??)   Medications Prior to Admission  Medication Sig Dispense Refill  . aspirin 81  MG tablet Take 81 mg by mouth 2 (two) times a week.     Marland Kitchen EPINEPHrine (EPIPEN 2-PAK) 0.3 mg/0.3 mL IJ SOAJ injection Inject 0.3 mg into the muscle once as needed (for anaphylaxis).    . felodipine (PLENDIL) 10 MG 24 hr tablet Take 10 mg by mouth daily.      . Multiple Vitamin (MULTIVITAMIN WITH MINERALS) TABS tablet Take 1 tablet by mouth daily.    . nitroGLYCERIN (NITROSTAT) 0.4 MG SL tablet Place 1 tablet (0.4 mg total) under the tongue every 5 (five) minutes as needed for chest pain. 25 tablet 3  . ondansetron (ZOFRAN ODT) 4 MG disintegrating tablet Take 1 tablet (4 mg total) by mouth every 8 (eight) hours as needed for nausea or vomiting. (Patient not taking: Reported on 06/18/2017) 20 tablet 0    Home: [place] expects to be discharged to:: Private residence Living Arrangements: Spouse/significant other Available Help at Discharge: Family Type of Home: House Home Access: Stairs to enter Technical brewer of Steps: 2 Entrance Stairs-Rails: None Home Layout: One level Bathroom Shower/Tub: Gaffer, Chiropodist: Handicapped height Home Equipment: Civil engineer, contracting, Environmental consultant - 2 wheels  Functional History: Prior Function Level of Independence: Needs assistance Gait / Transfers Assistance Needed: Recently been using RW for functional mobility due to weakness ADL's / Homemaking Assistance Needed: Husband has been assisting with LB ADLs recently due to weakness Comments: Husband and daughter perform IADLs Functional Status:  Mobility: Bed Mobility Overal bed mobility: Needs Assistance Bed Mobility: Supine to Sit, Sit to Supine Supine to sit: Mod assist Sit to supine: Min assist General bed mobility comments: Mod A to elevate trunk and bring hips towards EOB. Min A to manage BLEs Transfers Overall transfer level: Needs assistance Equipment used: Rolling walker (2 wheeled), None Transfers: Sit to/from Stand, Stand Pivot Transfers Sit to  Stand: Mod assist, +2 physical assistance(with RW) Stand pivot transfers: Mod assist, Max assist(to/from BSC) General transfer comment: on return to bed appeared pt trying to do more of a squat-pivot and did not fully stand Ambulation/Gait General Gait Details: unable due to weakness    ADL: ADL Overall ADL's : Needs assistance/impaired Eating/Feeding: Set up, Sitting, Bed level(supported sitting) Grooming: Minimal assistance, Sitting Grooming Details (indicate cue type and reason): Min A for sitting balance and bilateral tasks Upper Body Bathing: Minimal assistance, Sitting Lower Body Bathing: Maximal assistance, Sit to/from stand Upper Body Dressing : Minimal assistance, Sitting Lower Body Dressing: Maximal assistance, Sit to/from stand Toilet Transfer: Maximal assistance, Moderate assistance, +2 for safety/equipment, BSC, Stand-pivot Toilet Transfer Details (indicate cue type and reason): Mod A for stand pivot to [facility]. Requiring Max cues for sequencing. Max A for stand pivot to return to EOB.  Toileting- Clothing Manipulation and Hygiene: Maximal assistance, Sit to/from stand, +2 for physical assistance Toileting - Clothing Manipulation Details (indicate cue type and reason): Max A for standing balance while second perform perform toilet hygiene.  Functional mobility during ADLs: Maximal assistance, +2 for safety/equipment(stand pivot only) General ADL Comments: Pt with decreased functional performnce. Demosntrating decreased balance, cognition, strength  Cognition: Cognition Overall Cognitive Status: Impaired/Different from baseline Orientation Level: Oriented to person, Oriented to place, Oriented to time  Cognition Arousal/Alertness: Awake/alert Behavior During Therapy: WFL for tasks assessed/performed Overall Cognitive Status: Impaired/Different from baseline Area of Impairment: Following commands, Problem solving Following Commands: Follows one step commands with increased  time Problem Solving: Slow processing, Requires verbal cues General Comments: Pt requiring increased time and cues  Blood pressure (!) 123/52, pulse 71, temperature 98 F (36.7 C), temperature source Oral, resp. rate 18, height 5\' 2"  (1.575 m), weight 47.4 kg (104 lb 8 oz), SpO2 93 %. Physical Exam  Vitals reviewed. HENT:  Head: Normocephalic.  Eyes: EOM are normal. Right eye exhibits no discharge. Left eye exhibits no discharge.  Neck: Normal range of motion. Neck supple. No thyromegaly present.  Cardiovascular: Normal rate, regular rhythm and normal heart sounds.  Respiratory: Effort normal and breath sounds normal. No respiratory distress.  GI: Soft. Bowel sounds are normal. She exhibits no distension.  Neurological: She is alert.  Makes good eye contact with examiner. She was able to provide her name, age and upcoming birthday 1934-05-05. Follow simple commands. Fair awareness of deficits. Mild left central 7. Speech clear. LUE 4-/5 prox to distal. LLE 4- prox to 4/5 distally. RUE and RLE 4+/5. Sensory 1+/2 LUE and LLE.   Skin: Skin is warm and dry.    Results for orders placed or performed during the hospital encounter of 06/18/17 (from the past 24 hour(s))  Magnesium     Status: Abnormal   Collection Time: 06/19/17  8:34 AM  Result Value Ref Range   Magnesium 2.8 (H) 1.7 - 2.4 mg/dL  CBC     Status: Abnormal   Collection Time: 06/19/17 10:37 AM  Result Value Ref Range   WBC 7.9 4.0 - 10.5 K/uL   RBC 3.97 3.87 - 5.11 MIL/uL   Hemoglobin 10.9 (L) 12.0 - 15.0 g/dL   HCT 34.3 (L) 36.0 - 46.0 %   MCV 86.4 78.0 - 100.0 fL   MCH 27.5 26.0 - 34.0 pg   MCHC 31.8 30.0 - 36.0 g/dL   RDW 14.3 11.5 - 15.5 %   Platelets 370 150 - 400 K/uL  Creatinine, serum     Status: None   Collection Time: 06/19/17 10:37 AM  Result Value Ref Range   Creatinine, Ser 0.78 0.44 - 1.00 mg/dL   GFR calc non Af Amer >60 >60 mL/min   GFR calc Af Amer >60 >60 mL/min  Potassium     Status: Abnormal    Collection Time: 06/19/17  1:13 PM  Result Value Ref Range   Potassium 3.4 (L) 3.5 - 5.1 mmol/L  Magnesium     Status: Abnormal   Collection Time: 06/19/17  1:13 PM  Result Value Ref Range   Magnesium 2.6 (H) 1.7 - 2.4 mg/dL   Dg Chest 2 View  Result Date: 06/18/2017 CLINICAL DATA:  83 year old female with history of progressive weakness for 1 month. EXAM: CHEST - 2 VIEW COMPARISON:  Chest x-ray 05/08/2017. FINDINGS: Lung volumes are normal. In the lower left hemithorax there is a 3.0 x 2.6 cm mass-like opacity, which is not confidently identified on lateral projection. This appears intimately associated with the anterior aspect of the left fourth rib, and could simply reflect a healing rib fracture, however, the possibility of a primary pulmonary lesion is not excluded. Alternatively, this could reflect in rounded area of pneumonia. No other consolidative airspace disease noted. No pleural effusions. No evidence of pulmonary edema. Heart size is normal. Upper mediastinal contours are within normal limits. Aortic atherosclerosis. IMPRESSION: 1. New mass-like opacity  projecting over the lower left hemithorax seen only on the frontal projection. This is of uncertain etiology and significance, and could be infectious/inflammatory or neoplastic, or could alternatively reflect a healing fracture of the anterior aspect of the left fourth rib. Further evaluation with chest CT (preferably with IV contrast) is recommended in the near future to better evaluate this finding. 2. Aortic atherosclerosis. Electronically Signed   By: Vinnie Langton M.D.   On: 06/18/2017 16:41   Ct Head Wo Contrast  Result Date: 06/18/2017 CLINICAL DATA:  Complaints of progressive weakness.  Recent UTI. EXAM: CT HEAD WITHOUT CONTRAST TECHNIQUE: Contiguous axial images were obtained from the base of the skull through the vertex without intravenous contrast. COMPARISON:  05/01/2016. FINDINGS: Brain: No evidence for acute infarction,  hemorrhage, mass lesion, hydrocephalus, or extra-axial fluid. Generalized atrophy. Chronic microvascular ischemic change. Chronic RIGHT basal ganglia lacunar infarct. Vascular: Calcification of the cavernous internal carotid arteries consistent with cerebrovascular atherosclerotic disease. No signs of intracranial large vessel occlusion. Skull: Normal. Negative for fracture or focal lesion. Sinuses/Orbits: No acute finding. Other: None. IMPRESSION: Chronic changes as described. Progressive atrophy since 2018. No acute intracranial findings. Electronically Signed   By: Staci Righter M.D.   On: 06/18/2017 15:17   Ct Chest W Contrast  Result Date: 06/18/2017 CLINICAL DATA:  83 year old female with history of abnormal chest x-ray suspicious for pulmonary mass. EXAM: CT CHEST WITH CONTRAST TECHNIQUE: Multidetector CT imaging of the chest was performed during intravenous contrast administration. CONTRAST:  75 mL ISOVUE-300 IOPAMIDOL (ISOVUE-300) INJECTION 61% COMPARISON:  Chest CT 08/19/2011. FINDINGS: Cardiovascular: Heart size is normal. There is no significant pericardial fluid, thickening or pericardial calcification. There is aortic atherosclerosis, as well as atherosclerosis of the great vessels of the mediastinum and the coronary arteries, including calcified atherosclerotic plaque in the left main, left anterior descending, left circumflex and right coronary arteries. Mediastinum/Nodes: No pathologically enlarged mediastinal or hilar lymph nodes. Esophagus is unremarkable in appearance. No axillary lymphadenopathy. Lungs/Pleura: New left lower lobe mass measuring 3.0 x 2.2 x 2.0 cm (coronal image 44 of series 5 and axial image 105 of series 7), which has macrolobulated slightly spiculated margins, and is in contact with the right major fissure which is slightly retracted toward the lesion. Tiny right upper lobe calcified granuloma. A few scattered tiny 2-3 mm pulmonary nodules throughout the lungs bilaterally.  No acute consolidative airspace disease. No pleural effusions. Diffuse bronchial wall thickening with mild centrilobular and paraseptal emphysema. Upper Abdomen: Irregular areas of hypoattenuation throughout the spleen, new compared to the prior examination, likely to reflect multifocal splenic infarctions. Small calcified granuloma in the liver. 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 Musculoskeletal: Chronic appearing compression fractures of T6 and L1, most severe at T6 where there is 30% loss of anterior vertebral body height. There are no aggressive appearing lytic or blastic lesions noted in the visualized portions of the skeleton. IMPRESSION: 1. The finding on the recent chest radiograph corresponds to a 3.0 x 2.2 x 2.0 cm left lower lobe mass which is highly aggressive in appearance, very likely to represent a primary bronchogenic neoplasm. This is intimately associated with the left major fissure, but there is no associated mediastinal or hilar lymphadenopathy noted at this time. Further evaluation with PET-CT is recommended in the near future for additional diagnostic and staging purposes. 2. Multiple tiny 2-3 mm pulmonary nodules scattered throughout the lungs bilaterally, nonspecific and statistically favored to represent tiny areas of mucoid impaction within terminal  bronchioles. Attention on any future follow-up imaging is recommended to ensure stability or regression. 3. Diffuse bronchial wall thickening with mild centrilobular and paraseptal emphysema; imaging findings suggestive of underlying COPD. 4. Aortic atherosclerosis, in addition to left main and 3 vessel coronary artery disease. 5. Multiple splenic infarcts. 6. 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 Aortic Atherosclerosis (ICD10-I70.0) and Emphysema (ICD10-J43.9). Electronically Signed   By: Vinnie Langton M.D.   On: 06/18/2017 20:26   Mr Jeri Cos And Wo Contrast  Result Date:  06/18/2017 CLINICAL DATA:  83 year old female with vomiting for 2 days. Left side weakness onset yesterday. Possible left lung mass. EXAM: MRI HEAD WITHOUT AND WITH CONTRAST TECHNIQUE: Multiplanar, multiecho pulse sequences of the brain and surrounding structures were obtained without and with intravenous contrast. CONTRAST:  84mL MULTIHANCE GADOBENATE DIMEGLUMINE 529 MG/ML IV SOLN COMPARISON:  Head CT without contrast 1429 hours today, 05/01/2016. FINDINGS: Brain: There are numerous small foci of restricted diffusion scattered in both cerebral and cerebellar hemispheres. The largest areas of involvement are in the posterior right cerebellum (series 3, image 8), left cerebellar peduncle (image 12), and posterior right insula (image 24). Bilateral anterior and posterior vascular territories are affected. The deep gray matter nuclei are spared. Mild associated T2 and FLAIR hyperintensity at the larger areas of involvement. No associated hemorrhage or mass effect. No associated abnormal enhancement. Underlying widespread and confluent bilateral cerebral white matter T2 and FLAIR hyperintensity. Underlying chronic lacunar infarcts of the right basal ganglia. No chronic cerebral blood products identified. No midline shift, mass effect, evidence of mass lesion, ventriculomegaly, extra-axial collection or acute intracranial hemorrhage. Cervicomedullary junction and pituitary are within normal limits. No dural thickening. Vascular: Major intracranial vascular flow voids are preserved. The major dural venous sinuses are enhancing and appear patent. Skull and upper cervical spine: Lower cervical spine disc and endplate degeneration with up to mild degenerative spinal stenosis. Normal bone marrow signal. Sinuses/Orbits: Normal orbits soft tissues. The paranasal sinuses are clear. Other: Trace mastoid air cell fluid. Negative nasopharynx. Visible internal auditory structures appear normal. Negative orbit and scalp soft tissues.  IMPRESSION: 1. Numerous small acute infarcts scattered in the bilateral anterior and posterior circulation, suggesting a recent embolic event from the heart or proximal aorta. 2. No associated hemorrhage or mass effect. 3. Underlying chronic small vessel disease. 4. No abnormal intracranial enhancement or metastatic disease identified. Electronically Signed   By: Genevie Ann M.D.   On: 06/18/2017 18:43   Ct Abdomen Pelvis W Contrast  Result Date: 06/19/2017 CLINICAL DATA:  82 year old female with newly diagnosed left lower lobe mass. 20-30 pound of recent weight loss. Staging examination. EXAM: CT ABDOMEN AND PELVIS WITH CONTRAST TECHNIQUE: Multidetector CT imaging of the abdomen and pelvis was performed using the standard protocol following bolus administration of intravenous contrast. CONTRAST:  160mL ISOVUE-300 IOPAMIDOL (ISOVUE-300) INJECTION 61% COMPARISON:  No priors. FINDINGS: Lower chest: No change compared to yesterday's chest CT (see report from 06/18/2017). Hepatobiliary: No suspicious cystic or solid hepatic lesions. No intra or extrahepatic biliary ductal dilatation. Tiny partially calcified gallstones lying dependently in the gallbladder. No findings to suggest an acute cholecystitis at this time. Pancreas: No pancreatic mass. No pancreatic ductal dilatation. No pancreatic or peripancreatic fluid or inflammatory changes. Spleen: Multiple linear areas of low attenuation throughout the spleen, most compatible with areas of splenic infarctions. Adrenals/Urinary Tract: 8 mm low-attenuation lesion in the lower pole the left kidney, and 4 mm low-attenuation lesion in the upper pole the right  kidney are too small to characterize, but statistically likely tiny cysts. Mild atrophy of the left kidney. Bilateral adrenal glands are normal in appearance. No hydroureteronephrosis. Urinary bladder is normal in appearance. Stomach/Bowel: The appearance of the stomach is normal. No pathologic dilatation of small bowel  or colon. The appendix is not confidently identified and may be surgically absent. Regardless, there are no inflammatory changes noted adjacent to the cecum to suggest the presence of an acute appendicitis at this time. Vascular/Lymphatic: Aortic atherosclerosis, with aneurysmal dilatation of the celiac axis which measures up to 17 mm in diameter. No lymphadenopathy noted in the abdomen or pelvis. Reproductive: Status post hysterectomy. Ovaries are not confidently identified may be surgically absent or atrophic. Other: No significant volume of ascites.  No pneumoperitoneum. Musculoskeletal: Chronic appearing compression fracture of L1 with 30% loss of anterior vertebral body height. There are no aggressive appearing lytic or blastic lesions noted in the visualized portions of the skeleton. IMPRESSION: 1. No definite evidence of metastatic disease noted in the abdomen or pelvis. 2. Multiple splenic infarcts, new compared to prior study from 08/19/2011. 3. 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 4. Mild atrophy of the left kidney. 5. Cholelithiasis without evidence of acute cholecystitis at this time. 6. Additional incidental findings, as above. Electronically Signed   By: Vinnie Langton M.D.   On: 06/19/2017 21:53   Mr Jodene Nam Head Wo Contrast  Result Date: 06/19/2017 CLINICAL DATA:  Stroke follow-up EXAM: MRA HEAD WITHOUT CONTRAST TECHNIQUE: Angiographic images of the Circle of Willis were obtained using MRA technique without intravenous contrast. COMPARISON:  Brain MRI 06/18/2017 FINDINGS: Intracranial internal carotid arteries: Small medially projecting aneurysm from the distal cavernous segment of the left internal carotid artery, measuring approximately 3 x 2 mm. Normal right internal carotid artery. Anterior cerebral arteries: Normal. Middle cerebral arteries: Mild stenosis of the right middle cerebral artery M1 segment. Otherwise normal middle cerebral arteries.  Posterior communicating arteries: Present bilaterally. Posterior cerebral arteries: Normal. Basilar artery: Normal. Vertebral arteries: Left dominant. Normal. Superior cerebellar arteries: Normal. Anterior inferior cerebellar arteries: Normal. Posterior inferior cerebellar arteries: Normal. IMPRESSION: 1. No emergent large vessel occlusion or high-grade stenosis. Mild narrowing of the right middle cerebral artery M1 segment. 2. 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 Electronically Signed   By: Ulyses Jarred M.D.   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 Does the need for close, 24 hr/day medical supervision in concert with the patient's rehab needs make it unreasonable for this patient to be served in a less intensive setting? Yes 2. Co-Morbidities requiring supervision/potential complications: lung mass, htn 3. Due to bladder management, bowel management, safety, skin/wound care, disease management, medication administration, pain management and patient education, does the patient require 24 hr/day rehab nursing? Yes 4. Does the patient require coordinated care of a physician, rehab nurse, PT (1-2 hrs/day, 5 days/week), OT (1-2 hrs/day, 5 days/week) and SLP (1-2 hrs/day, 5 days/week) to address physical and functional deficits in the context of the above medical diagnosis(es)? Yes Addressing deficits in the following areas: balance, endurance, locomotion, strength, transferring, bowel/bladder control, bathing, dressing, feeding, grooming, toileting, cognition and psychosocial support 5. Can the patient actively participate in an intensive therapy program of at least 3 hrs of therapy per day at least 5 days per week? Yes 6. The potential for patient to make  measurable gains while on inpatient rehab is excellent 7. Anticipated functional  outcomes upon discharge from inpatient rehab are modified independent and supervision  with PT, modified independent and supervision with OT, modified independent and supervision with SLP. 8. Estimated rehab length of stay to reach the above functional goals is: potentially 11-15 days 9. Anticipated D/C setting: Home 10. Anticipated post D/C treatments: [place] therapy 11. Overall Rehab/Functional Prognosis: excellent  RECOMMENDATIONS: This patient's condition is appropriate for continued rehabilitative care in the following setting: CIR Patient has agreed to participate in recommended program. Yes Note that insurance prior authorization may be required for reimbursement for recommended care.  Comment: Rehab Admissions Coordinator to follow up.  Thanks,  Meredith Staggers, MD, Mellody Drown    Lavon Paganini Angiulli, PA-C 06/20/2017

## 2017-06-20 NOTE — Progress Notes (Signed)
Rehab admissions - I met with patient and her husband.  Their first choice for rehab is [facility] in [place] because it is convenient and 5 minutes from their home.  I did give patient rehab booklets and talk about inpatient rehab.  Husband is concerned about findings of a chest mass and talked about follow up for the mass in the next couple days.  I will follow along for now.  Call me for questions.  #[phone]

## 2017-06-20 NOTE — Progress Notes (Signed)
VASCULAR LAB PRELIMINARY  PRELIMINARY  PRELIMINARY  PRELIMINARY  Bilateral carotid duplex completed.    Preliminary report:  1-39% ICA stenosis.  Vertebral artery flow is antegrade.   [name] [name], RVT 06/20/2017, 4:27 PM

## 2017-06-20 NOTE — Progress Notes (Addendum)
Patient ID: [name] [name], female   DOB: 1935-03-03, 83 y.o.   MRN: [id number]  PROGRESS NOTE    Shalayah Beagley  DDU:[id number] DOB: August 01, 1934 DOA: 06/18/2017 PCP: Leonard Downing, MD   Brief Narrative:  83 year old female with history of hypertension and CAD with remote stent presented with weakness.  CT of the head was unremarkable.  MRI of the brain revealed numerous small acute infarcts.  Neurology was consulted.  CT of the chest showed left lower lobe mass.  Assessment & Plan:   Principal Problem:   Acute CVA (cerebrovascular accident) [facility]) Active Problems:   Essential hypertension   Mass of lower lobe of left lung   Hypokalemia   Ischemic stroke ([place])   1. Acute CVA  - CT was negative for acute intracranial abnormality, but MRI brain reveals numerous small acute infarcts scattered about the bilateral anterior and posterior circulation  - Neurology following  - Had recent TTE on 05/29/2017 which showed EF of 60-65% with grade 1 diastolic dysfunction.  Neurology is recommending TEE.  Cardiology has been notified.  No need for loop recorder at this time as per neurology -MRA of the head did not show any large vessel occlusion - Continue cardiac monitoring, frequent neuro checks - Continue ASA and statin. -Patient tolerating diet. -PT OT recommends CIR.  CIR consulted -Carotid ultrasound pending   2. Left lung mass  - Chest CT reveals LLL mass with highly-aggressive appearance, underlying COPD changes  -As per IR, lung mass biopsy will have to be performed as an outpatient after pulmonary consult and/or CT surgery evaluation -CT of the abdomen was negative for any metastatic disease -Outpatient evaluation with pulmonary has been set up for April 10 at 1030 with Dr. Vaughan Browner   3. Hypokalemia  - Improved.  Repeat a.m. labs  4. Hypertension  - Monitor blood pressure.  Allow permissive hypertension until today.  5.  Ventricular tachycardia noted on telemetry -Potassium  was replaced yesterday.  Potassium and magnesium are normal today.  Cardiology consulted.  Patient had a recent echo done in February 2019   DVT prophylaxis:  Lovenox Code Status: Full Family Communication: None at bedside Disposition Plan: Probable CIR in 1-2 days  Consultants: Neurology/cardiology  Procedures: None  Antimicrobials: None   Subjective: Patient seen and examined at bedside.  No overnight fever, nausea or vomiting.  Feels weak.   Objective: Vitals:   06/19/17 2100 06/20/17 0041 06/20/17 0359 06/20/17 0807  BP: 139/63 (!) 128/52 (!) 123/52   Pulse:      Resp:      Temp: 97.8 F (36.6 C) (!) 96.1 F (35.6 C) 98 F (36.7 C) 98.7 F (37.1 C)  TempSrc: Oral Oral Oral   SpO2:  93%    Weight:      Height:        Intake/Output Summary (Last 24 hours) at 06/20/2017 0842 Last data filed at 06/20/2017 0500 Gross per 24 hour  Intake 180 ml  Output -  Net 180 ml   Filed Weights   06/18/17 1339 06/19/17 0101  Weight: 52.6 kg (116 lb) 47.4 kg (104 lb 8 oz)    Examination:  General exam: Appears calm and comfortable.  Very thinly built female.  No acute distress Respiratory system: Bilateral decreased breath sounds at bases Cardiovascular system: S1 & S2 heard; currently rate controlled  gastrointestinal system: Abdomen is nondistended, soft and nontender. Normal bowel sounds heard. Central nervous system: Alert and oriented. No focal neurological deficits. Moving extremities Extremities: No  cyanosis, clubbing, edema  Skin: No rashes, lesions or ulcers Lymph: No cervical lymphadenopathy    Data Reviewed: I have personally reviewed following labs and imaging studies  CBC: Recent Labs  Lab 06/18/17 1351 06/18/17 1402 06/19/17 1037 06/20/17 0526  WBC 9.5  --  7.9 8.9  NEUTROABS 5.1  --   --  4.5  HGB 12.0 12.2 10.9* 11.4*  HCT 36.8 36.0 34.3* 35.9*  MCV 86.2  --  86.4 87.3  PLT 429*  --  370 542   Basic Metabolic Panel: Recent Labs  Lab  06/18/17 1351 06/18/17 1402 06/18/17 1913 06/19/17 0552 06/19/17 0834 06/19/17 1037 06/19/17 1313 06/20/17 0526  NA 138 142  --  140  --   --   --  138  K 2.8* 2.9*  --  3.6  --   --  3.4* 4.6  CL 103 103  --  108  --   --   --  106  CO2 25  --   --  23  --   --   --  25  GLUCOSE 108* 103*  --  94  --   --   --  96  BUN 5* 3*  --  <5*  --   --   --  5*  CREATININE 0.83 0.70  --  0.73  --  0.78  --  0.88  CALCIUM 8.3*  --   --  8.3*  --   --   --  8.4*  MG  --   --  1.8  --  2.8*  --  2.6* 2.3   GFR: Estimated Creatinine Clearance: 38.2 mL/min (by C-G formula based on SCr of 0.88 mg/dL). Liver Function Tests: Recent Labs  Lab 06/18/17 1351 06/20/17 0526  AST 23 20  ALT 12* 12*  ALKPHOS 61 56  BILITOT 0.6 0.4  PROT 6.0* 5.6*  ALBUMIN 2.8* 2.6*   No results for input(s): LIPASE, AMYLASE in the last 168 hours. No results for input(s): AMMONIA in the last 168 hours. Coagulation Profile: Recent Labs  Lab 06/18/17 1351  INR 1.04   Cardiac Enzymes: No results for input(s): CKTOTAL, CKMB, CKMBINDEX, TROPONINI in the last 168 hours. BNP (last 3 results) No results for input(s): PROBNP in the last 8760 hours. HbA1C: Recent Labs    06/18/17 1913  HGBA1C 5.4   CBG: Recent Labs  Lab 06/18/17 1356 06/19/17 0517  GLUCAP 104* 98   Lipid Profile: Recent Labs    06/18/17 1913 06/19/17 0552  CHOL 182 162  HDL 52 46  LDLCALC 109* 96  TRIG 105 101  CHOLHDL 3.5 3.5   Thyroid Function Tests: No results for input(s): TSH, T4TOTAL, FREET4, T3FREE, THYROIDAB in the last 72 hours. Anemia Panel: No results for input(s): VITAMINB12, FOLATE, FERRITIN, TIBC, IRON, RETICCTPCT in the last 72 hours. Sepsis Labs: No results for input(s): PROCALCITON, LATICACIDVEN in the last 168 hours.  Recent Results (from the past 240 hour(s))  Urine culture     Status: Abnormal   Collection Time: 06/18/17  8:48 PM  Result Value Ref Range Status   Specimen Description URINE, CATHETERIZED   Final   Special Requests   Final    NONE Performed at [facility], [address], [place], [place] [address]    Culture MULTIPLE SPECIES PRESENT, SUGGEST RECOLLECTION (A)  Final   Report Status 06/20/2017 FINAL  Final         Radiology Studies: Dg Chest 2 View  Result Date: 06/18/2017 CLINICAL DATA:  83 year old female with history of progressive weakness for 1 month. EXAM: CHEST - 2 VIEW COMPARISON:  Chest x-ray 05/08/2017. FINDINGS: Lung volumes are normal. In the lower left hemithorax there is a 3.0 x 2.6 cm mass-like opacity, which is not confidently identified on lateral projection. This appears intimately associated with the anterior aspect of the left fourth rib, and could simply reflect a healing rib fracture, however, the possibility of a primary pulmonary lesion is not excluded. Alternatively, this could reflect in rounded area of pneumonia. No other consolidative airspace disease noted. No pleural effusions. No evidence of pulmonary edema. Heart size is normal. Upper mediastinal contours are within normal limits. Aortic atherosclerosis. IMPRESSION: 1. New mass-like opacity projecting over the lower left hemithorax seen only on the frontal projection. This is of uncertain etiology and significance, and could be infectious/inflammatory or neoplastic, or could alternatively reflect a healing fracture of the anterior aspect of the left fourth rib. Further evaluation with chest CT (preferably with IV contrast) is recommended in the near future to better evaluate this finding. 2. Aortic atherosclerosis. Electronically Signed   By: Vinnie Langton M.D.   On: 06/18/2017 16:41   Ct Head Wo Contrast  Result Date: 06/18/2017 CLINICAL DATA:  Complaints of progressive weakness.  Recent UTI. EXAM: CT HEAD WITHOUT CONTRAST TECHNIQUE: Contiguous axial images were obtained from the base of the skull through the vertex without intravenous contrast. COMPARISON:  05/01/2016. FINDINGS: Brain: No  evidence for acute infarction, hemorrhage, mass lesion, hydrocephalus, or extra-axial fluid. Generalized atrophy. Chronic microvascular ischemic change. Chronic RIGHT basal ganglia lacunar infarct. Vascular: Calcification of the cavernous internal carotid arteries consistent with cerebrovascular atherosclerotic disease. No signs of intracranial large vessel occlusion. Skull: Normal. Negative for fracture or focal lesion. Sinuses/Orbits: No acute finding. Other: None. IMPRESSION: Chronic changes as described. Progressive atrophy since 2018. No acute intracranial findings. Electronically Signed   By: Staci Righter M.D.   On: 06/18/2017 15:17   Ct Chest W Contrast  Result Date: 06/18/2017 CLINICAL DATA:  83 year old female with history of abnormal chest x-ray suspicious for pulmonary mass. EXAM: CT CHEST WITH CONTRAST TECHNIQUE: Multidetector CT imaging of the chest was performed during intravenous contrast administration. CONTRAST:  75 mL ISOVUE-300 IOPAMIDOL (ISOVUE-300) INJECTION 61% COMPARISON:  Chest CT 08/19/2011. FINDINGS: Cardiovascular: Heart size is normal. There is no significant pericardial fluid, thickening or pericardial calcification. There is aortic atherosclerosis, as well as atherosclerosis of the great vessels of the mediastinum and the coronary arteries, including calcified atherosclerotic plaque in the left main, left anterior descending, left circumflex and right coronary arteries. Mediastinum/Nodes: No pathologically enlarged mediastinal or hilar lymph nodes. Esophagus is unremarkable in appearance. No axillary lymphadenopathy. Lungs/Pleura: New left lower lobe mass measuring 3.0 x 2.2 x 2.0 cm (coronal image 44 of series 5 and axial image 105 of series 7), which has macrolobulated slightly spiculated margins, and is in contact with the right major fissure which is slightly retracted toward the lesion. Tiny right upper lobe calcified granuloma. A few scattered tiny 2-3 mm pulmonary nodules  throughout the lungs bilaterally. No acute consolidative airspace disease. No pleural effusions. Diffuse bronchial wall thickening with mild centrilobular and paraseptal emphysema. Upper Abdomen: Irregular areas of hypoattenuation throughout the spleen, new compared to the prior examination, likely to reflect multifocal splenic infarctions. Small calcified granuloma in the liver. 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 Musculoskeletal: Chronic appearing compression fractures of T6 and L1, most severe at T6  where there is 30% loss of anterior vertebral body height. There are no aggressive appearing lytic or blastic lesions noted in the visualized portions of the skeleton. IMPRESSION: 1. The finding on the recent chest radiograph corresponds to a 3.0 x 2.2 x 2.0 cm left lower lobe mass which is highly aggressive in appearance, very likely to represent a primary bronchogenic neoplasm. This is intimately associated with the left major fissure, but there is no associated mediastinal or hilar lymphadenopathy noted at this time. Further evaluation with PET-CT is recommended in the near future for additional diagnostic and staging purposes. 2. Multiple tiny 2-3 mm pulmonary nodules scattered throughout the lungs bilaterally, nonspecific and statistically favored to represent tiny areas of mucoid impaction within terminal bronchioles. Attention on any future follow-up imaging is recommended to ensure stability or regression. 3. Diffuse bronchial wall thickening with mild centrilobular and paraseptal emphysema; imaging findings suggestive of underlying COPD. 4. Aortic atherosclerosis, in addition to left main and 3 vessel coronary artery disease. 5. Multiple splenic infarcts. 6. 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 Aortic Atherosclerosis (ICD10-I70.0) and Emphysema (ICD10-J43.9). Electronically Signed   By: Vinnie Langton M.D.   On: 06/18/2017 20:26   Mr Jeri Cos And Wo  Contrast  Result Date: 06/18/2017 CLINICAL DATA:  83 year old female with vomiting for 2 days. Left side weakness onset yesterday. Possible left lung mass. EXAM: MRI HEAD WITHOUT AND WITH CONTRAST TECHNIQUE: Multiplanar, multiecho pulse sequences of the brain and surrounding structures were obtained without and with intravenous contrast. CONTRAST:  57mL MULTIHANCE GADOBENATE DIMEGLUMINE 529 MG/ML IV SOLN COMPARISON:  Head CT without contrast 1429 hours today, 05/01/2016. FINDINGS: Brain: There are numerous small foci of restricted diffusion scattered in both cerebral and cerebellar hemispheres. The largest areas of involvement are in the posterior right cerebellum (series 3, image 8), left cerebellar peduncle (image 12), and posterior right insula (image 24). Bilateral anterior and posterior vascular territories are affected. The deep gray matter nuclei are spared. Mild associated T2 and FLAIR hyperintensity at the larger areas of involvement. No associated hemorrhage or mass effect. No associated abnormal enhancement. Underlying widespread and confluent bilateral cerebral white matter T2 and FLAIR hyperintensity. Underlying chronic lacunar infarcts of the right basal ganglia. No chronic cerebral blood products identified. No midline shift, mass effect, evidence of mass lesion, ventriculomegaly, extra-axial collection or acute intracranial hemorrhage. Cervicomedullary junction and pituitary are within normal limits. No dural thickening. Vascular: Major intracranial vascular flow voids are preserved. The major dural venous sinuses are enhancing and appear patent. Skull and upper cervical spine: Lower cervical spine disc and endplate degeneration with up to mild degenerative spinal stenosis. Normal bone marrow signal. Sinuses/Orbits: Normal orbits soft tissues. The paranasal sinuses are clear. Other: Trace mastoid air cell fluid. Negative nasopharynx. Visible internal auditory structures appear normal. Negative  orbit and scalp soft tissues. IMPRESSION: 1. Numerous small acute infarcts scattered in the bilateral anterior and posterior circulation, suggesting a recent embolic event from the heart or proximal aorta. 2. No associated hemorrhage or mass effect. 3. Underlying chronic small vessel disease. 4. No abnormal intracranial enhancement or metastatic disease identified. Electronically Signed   By: Genevie Ann M.D.   On: 06/18/2017 18:43   Ct Abdomen Pelvis W Contrast  Result Date: 06/19/2017 CLINICAL DATA:  83 year old female with newly diagnosed left lower lobe mass. 20-30 pound of recent weight loss. Staging examination. EXAM: CT ABDOMEN AND PELVIS WITH CONTRAST TECHNIQUE: Multidetector CT imaging of the abdomen and pelvis was performed using  the standard protocol following bolus administration of intravenous contrast. CONTRAST:  138mL ISOVUE-300 IOPAMIDOL (ISOVUE-300) INJECTION 61% COMPARISON:  No priors. FINDINGS: Lower chest: No change compared to yesterday's chest CT (see report from 06/18/2017). Hepatobiliary: No suspicious cystic or solid hepatic lesions. No intra or extrahepatic biliary ductal dilatation. Tiny partially calcified gallstones lying dependently in the gallbladder. No findings to suggest an acute cholecystitis at this time. Pancreas: No pancreatic mass. No pancreatic ductal dilatation. No pancreatic or peripancreatic fluid or inflammatory changes. Spleen: Multiple linear areas of low attenuation throughout the spleen, most compatible with areas of splenic infarctions. Adrenals/Urinary Tract: 8 mm low-attenuation lesion in the lower pole the left kidney, and 4 mm low-attenuation lesion in the upper pole the right kidney are too small to characterize, but statistically likely tiny cysts. Mild atrophy of the left kidney. Bilateral adrenal glands are normal in appearance. No hydroureteronephrosis. Urinary bladder is normal in appearance. Stomach/Bowel: The appearance of the stomach is normal. No  pathologic dilatation of small bowel or colon. The appendix is not confidently identified and may be surgically absent. Regardless, there are no inflammatory changes noted adjacent to the cecum to suggest the presence of an acute appendicitis at this time. Vascular/Lymphatic: Aortic atherosclerosis, with aneurysmal dilatation of the celiac axis which measures up to 17 mm in diameter. No lymphadenopathy noted in the abdomen or pelvis. Reproductive: Status post hysterectomy. Ovaries are not confidently identified may be surgically absent or atrophic. Other: No significant volume of ascites.  No pneumoperitoneum. Musculoskeletal: Chronic appearing compression fracture of L1 with 30% loss of anterior vertebral body height. There are no aggressive appearing lytic or blastic lesions noted in the visualized portions of the skeleton. IMPRESSION: 1. No definite evidence of metastatic disease noted in the abdomen or pelvis. 2. Multiple splenic infarcts, new compared to prior study from 08/19/2011. 3. 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 4. Mild atrophy of the left kidney. 5. Cholelithiasis without evidence of acute cholecystitis at this time. 6. Additional incidental findings, as above. Electronically Signed   By: Vinnie Langton M.D.   On: 06/19/2017 21:53   Mr Jodene Nam Head Wo Contrast  Result Date: 06/19/2017 CLINICAL DATA:  Stroke follow-up EXAM: MRA HEAD WITHOUT CONTRAST TECHNIQUE: Angiographic images of the Circle of Willis were obtained using MRA technique without intravenous contrast. COMPARISON:  Brain MRI 06/18/2017 FINDINGS: Intracranial internal carotid arteries: Small medially projecting aneurysm from the distal cavernous segment of the left internal carotid artery, measuring approximately 3 x 2 mm. Normal right internal carotid artery. Anterior cerebral arteries: Normal. Middle cerebral arteries: Mild stenosis of the right middle cerebral artery M1 segment.  Otherwise normal middle cerebral arteries. Posterior communicating arteries: Present bilaterally. Posterior cerebral arteries: Normal. Basilar artery: Normal. Vertebral arteries: Left dominant. Normal. Superior cerebellar arteries: Normal. Anterior inferior cerebellar arteries: Normal. Posterior inferior cerebellar arteries: Normal. IMPRESSION: 1. No emergent large vessel occlusion or high-grade stenosis. Mild narrowing of the right middle cerebral artery M1 segment. 2. 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 Electronically Signed   By: Ulyses Jarred M.D.   On: 06/19/2017 00:57        Scheduled Meds: . aspirin EC  81 mg Oral Daily  . aspirin EC  81 mg Oral Daily  . atorvastatin  20 mg Oral q1800  . enoxaparin (LOVENOX) injection  40 mg Subcutaneous Q24H  . multivitamin with minerals  1 tablet Oral Daily  Continuous Infusions:   LOS: 2 days        Aline August, MD [facility] Pager [phone]  If 7PM-7AM, please contact night-coverage [email or web address] Password [facility] 06/20/2017, 8:42 AM

## 2017-06-20 NOTE — H&P (View-Only) (Signed)
STROKE TEAM PROGRESS NOTE   HISTORY OF PRESENT ILLNESS (per record) [name] [name] is an 83 y.o. female [place] a PMH of HTN, HLD and CAD who presents with worsening left sided weakness since yesterday. Husband states he first noticed her worsening weakness yesterday when he was helping her after she fell in the bathroom. She sustained no injury and was able to ambulate back to bed but he noticed that her left leg appeared weaker than her Right. The husband reports the patient has been sick with a UTI for over one month. He states she has some mild cognitive decline over the past year. The patient states she has had generalized weakness since she started antibiotics for the UTI.  Patient denies any H/A, visual changes, no CP or SOB. Husband denies noticing any slurred speech or worsening confusion  Date last known well: Date: 06/18/2017 Time last known well: unknown tPA Given: No: outside the window Modified Rankin: Rankin Score=1 NIH Stroke Scale: 2   SUBJECTIVE (INTERVAL HISTORY) Her  Husband is at the bedside.    Both carotid ultrasound and TEE have not yet been done. No neurological changes. No new complaints.   OBJECTIVE Temp:  [96.1 F (35.6 C)-98.7 F (37.1 C)] 98.6 F (37 C) (03/18 1200) Cardiac Rhythm: Normal sinus rhythm (03/18 0700) BP: (123-139)/(52-63) 123/52 (03/18 0359) SpO2:  [93 %] 93 % (03/18 0041)  CBC:  Recent Labs  Lab 06/18/17 1351  06/19/17 1037 06/20/17 0526  WBC 9.5  --  7.9 8.9  NEUTROABS 5.1  --   --  4.5  HGB 12.0   < > 10.9* 11.4*  HCT 36.8   < > 34.3* 35.9*  MCV 86.2  --  86.4 87.3  PLT 429*  --  370 394   < > = values in this interval not displayed.    Basic Metabolic Panel:  Recent Labs  Lab 06/19/17 0552  06/19/17 1037 06/19/17 1313 06/20/17 0526  NA 140  --   --   --  138  K 3.6  --   --  3.4* 4.6  CL 108  --   --   --  106  CO2 23  --   --   --  25  GLUCOSE 94  --   --   --  96  BUN <5*  --   --   --  5*  CREATININE 0.73  --   0.78  --  0.88  CALCIUM 8.3*  --   --   --  8.4*  MG  --    < >  --  2.6* 2.3   < > = values in this interval not displayed.    Lipid Panel:     Component Value Date/Time   CHOL 162 06/19/2017 0552   TRIG 101 06/19/2017 0552   HDL 46 06/19/2017 0552   CHOLHDL 3.5 06/19/2017 0552   VLDL 20 06/19/2017 0552   LDLCALC 96 06/19/2017 0552   HgbA1c:  Lab Results  Component Value Date   HGBA1C 5.4 06/18/2017   Urine Drug Screen: No results found for: LABOPIA, COCAINSCRNUR, LABBENZ, AMPHETMU, THCU, LABBARB  Alcohol Level No results found for: [facility]  IMAGING  Dg Chest 2 View 06/18/2017 IMPRESSION:  1. New mass-like opacity projecting over the lower left hemithorax seen only on the frontal projection. This is of uncertain etiology and significance, and could be infectious/inflammatory or neoplastic, or could alternatively reflect a healing fracture of the anterior aspect of the left fourth  rib. Further evaluation with chest CT (preferably with IV contrast) is recommended in the near future to better evaluate this finding.  2. Aortic atherosclerosis.    Ct Head Wo Contrast 06/18/2017 IMPRESSION:  Chronic changes as described. Progressive atrophy since 2018. No acute intracranial findings.    Ct Chest W Contrast 06/18/2017  IMPRESSION:  1. The finding on the recent chest radiograph corresponds to a 3.0 x 2.2 x 2.0 cm left lower lobe mass which is highly aggressive in appearance, very likely to represent a primary bronchogenic neoplasm. This is intimately associated with the left major fissure, but there is no associated mediastinal or hilar lymphadenopathy noted at this time. Further evaluation with PET-CT is recommended in the near future for additional diagnostic and staging purposes.  2. Multiple tiny 2-3 mm pulmonary nodules scattered throughout the lungs bilaterally, nonspecific and statistically favored to represent tiny areas of mucoid impaction within terminal bronchioles. Attention  on any future follow-up imaging is recommended to ensure stability or regression.  3. Diffuse bronchial wall thickening with mild centrilobular and paraseptal emphysema; imaging findings suggestive of underlying COPD.  4. Aortic atherosclerosis, in addition to left main and 3 vessel coronary artery disease.  5. Multiple splenic infarcts.  6. 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 Aortic Atherosclerosis (ICD10-I70.0) and Emphysema (ICD10-J43.9).     Mr Jeri Cos And Wo Contrast 06/18/2017 IMPRESSION:  1. Numerous small acute infarcts scattered in the bilateral anterior and posterior circulation, suggesting a recent embolic event from the heart or proximal aorta.  2. No associated hemorrhage or mass effect.  3. Underlying chronic small vessel disease.  4. No abnormal intracranial enhancement or metastatic disease identified.   Mr Jodene Nam Head Wo Contrast 06/19/2017 IMPRESSION:  1. No emergent large vessel occlusion or high-grade stenosis. Mild narrowing of the right middle cerebral artery M1 segment.  2. 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     Transthoracic Echocardiogram  05/09/2017 Study Conclusions - Left ventricle: The cavity size was normal. Wall thickness was   normal. Systolic function was normal. The estimated ejection   fraction was in the range of 60% to 65%. Wall motion was normal;   there were no regional wall motion abnormalities. Doppler   parameters are consistent with abnormal left ventricular   relaxation (grade 1 diastolic dysfunction).    Bilateral Carotid Dopplers - pending 00/00/00     PHYSICAL EXAM Vitals:   06/20/17 0041 06/20/17 0359 06/20/17 0807 06/20/17 1200  BP: (!) 128/52 (!) 123/52    Pulse:      Resp:      Temp: (!) 96.1 F (35.6 C) 98 F (36.7 C) 98.7 F (37.1 C) 98.6 F (37 C)  TempSrc: Oral Oral    SpO2: 93%     Weight:      Height:        Frail pleasant elderly Caucasian lady who is cachectic.  Not in distress. . Afebrile. Head is nontraumatic. Neck is supple without bruit.    Cardiac exam no murmur or gallop. Lungs are clear to auscultation. Distal pulses are well felt. Neurological Exam ;  Awake  Alert oriented x 3. Normal speech and language.eye movements full without nystagmus.fundi were not visualized. Vision acuity and fields appear normal. Hearing is normal. Palatal movements are normal. Face symmetric. Tongue midline. Normal symmetric 4/5 strength, tone, reflexes and coordination. Normal sensation. Gait deferred.    ASSESSMENT/PLAN Ms. [name]  [name] is a 83 y.o. female with history of pneumonia, hyperlipidemia, recent UTI, cognitive decline times 1 year, hypertension, coronary artery disease, and anxiety presenting with left-sided weakness. She did not receive IV t-PA due to late presentation.  Stroke: Multiple small acute bilateral scattered infarcts likely embolic etiology unknown.  Resultant generalized weakness  CT head - Progressive atrophy since 2018. No acute intracranial findings.   MRI head - Numerous small acute infarcts scattered in the bilateral anterior and posterior circulation, suggesting a recent embolic event from the heart or proximal aorta.   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  Carotid Doppler - pending  2D Echo - 05/09/2017 -unremarkable  LDL - 96  HgbA1c - 5.4  VTE prophylaxis -Lovenox Fall precautions Diet Heart Room service appropriate? Yes; Fluid consistency: Thin  aspirin 81 mg daily prior to admission, now on aspirin 81 mg daily  Patient counseled to be compliant with her antithrombotic medications  Ongoing aggressive stroke risk factor management  Therapy recommendations:  CIR recommended  Disposition:  Pending  Hypertension  Stable  Permissive hypertension (OK if < 220/120) but gradually normalize in 5-7 days  Long-term BP goal  normotensive  Hyperlipidemia  Home meds: No lipid-lowering medications prior to admission  LDL 96, goal < 70  Now on Lipitor 20 mg daily  Continue statin at discharge   Other Stroke Risk Factors  Advanced age  Former cigarette smoker - quit 6 years ago  Coronary artery disease   Other Active Problems  Abnormal chest x-ray consistent with primary bronchogenic neoplasm. Workup in progress.  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   Plan / Recommendations   Check the for marantic endocarditis from suspected lung cancer versus other cardiac source of embolism.  No need to consider loop recorder at the present time as she may not be a long-term candidate for anticoagulation if diagnosis of lung cancer is confirmed  Continue aspirin for now.  Will not add Plavix as she is likely to need lung biopsy.      Hospital day # 2  I have personally examined this patient, reviewed notes, independently viewed imaging studies, participated in medical decision making and plan of care.ROS completed by me personally and pertinent positives fully documented  I have made any additions or clarifications directly to the above note.  She has presented with by cerebral embolic infarcts etiology indeterminate as to brain metastasis from lung cancer versus marantic endocarditis or cardiac source of embolism.  Recommend check TEE but no need for loop recorder at the present time unless diagnosis of cancer has been ruled out.  Long discussion with the patient and Dr.Kshitz and answered questions.   Antony Contras, MD Medical Director [facility] Pager: [phone] 06/20/2017 3:05 PM   To contact Stroke Continuity provider, please refer to [email or web address] After hours, contact General Neurology

## 2017-06-21 ENCOUNTER — Encounter (HOSPITAL_COMMUNITY)
Admission: EM | Disposition: A | Discharge status: Skilled Nursing Facility | Payer: Self-pay | Source: Home / Self Care | Attending: Internal Medicine

## 2017-06-21 ENCOUNTER — Inpatient Hospital Stay (HOSPITAL_COMMUNITY): Payer: Medicare Other

## 2017-06-21 ENCOUNTER — Encounter (HOSPITAL_COMMUNITY): Payer: Self-pay | Admitting: *Deleted

## 2017-06-21 DIAGNOSIS — I639 Cerebral infarction, unspecified: Secondary | ICD-10-CM

## 2017-06-21 HISTORY — PX: TEE WITHOUT CARDIOVERSION: SHX5443

## 2017-06-21 LAB — BASIC METABOLIC PANEL
Anion gap: 10 (ref 5–15)
BUN: 5 mg/dL — ABNORMAL LOW (ref 6–20)
CALCIUM: 8.3 mg/dL — AB (ref 8.9–10.3)
CHLORIDE: 105 mmol/L (ref 101–111)
CO2: 23 mmol/L (ref 22–32)
CREATININE: 0.79 mg/dL (ref 0.44–1.00)
GFR calc non Af Amer: >60 mL/min (ref >60)
Glucose, Bld: 92 mg/dL (ref 65–99)
Potassium: 4.2 mmol/L (ref 3.5–5.1)
SODIUM: 138 mmol/L (ref 135–145)

## 2017-06-21 LAB — PROTIME-INR
INR: 1.04
PROTHROMBIN TIME: 13.5 s (ref 11.4–15.2)

## 2017-06-21 SURGERY — ECHOCARDIOGRAM, TRANSESOPHAGEAL
Anesthesia: Moderate Sedation

## 2017-06-21 MED ORDER — MIDAZOLAM HCL 10 MG/2ML IJ SOLN
INTRAMUSCULAR | Status: DC | PRN
  Administered 2017-06-21: 2 mg via INTRAVENOUS

## 2017-06-21 MED ORDER — ATORVASTATIN CALCIUM 20 MG PO TABS: 20.0000 mg | ORAL_TABLET | Freq: Every day | ORAL | 0 refills | Status: DC

## 2017-06-21 MED ORDER — ASPIRIN 81 MG PO TABS: 81.0000 mg | ORAL_TABLET | Freq: Every day | ORAL | Status: DC

## 2017-06-21 MED ORDER — BUTAMBEN-TETRACAINE-BENZOCAINE 2-2-14 % EX AERO
INHALATION_SPRAY | CUTANEOUS | Status: DC | PRN
  Administered 2017-06-21: 2 via TOPICAL

## 2017-06-21 MED ORDER — FENTANYL CITRATE (PF) 100 MCG/2ML IJ SOLN
INTRAMUSCULAR | Status: AC
  Filled 2017-06-21: qty 2

## 2017-06-21 MED ORDER — SODIUM CHLORIDE 0.9 % IV SOLN
INTRAVENOUS | Status: AC | PRN
  Administered 2017-06-21: 500 mL via INTRAVENOUS

## 2017-06-21 MED ORDER — MIDAZOLAM HCL 5 MG/ML IJ SOLN
INTRAMUSCULAR | Status: AC
  Filled 2017-06-21: qty 2

## 2017-06-21 NOTE — Progress Notes (Addendum)
Progress Note  Patient Name: [name] [name] Date of Encounter: 06/21/2017  Primary Cardiologist: Jenkins Rouge, MD   Subjective   No chest pain or dyspnea  Inpatient Medications    Scheduled Meds: . aspirin EC  81 mg Oral Daily  . aspirin EC  81 mg Oral Daily  . atorvastatin  20 mg Oral q1800  . enoxaparin (LOVENOX) injection  30 mg Subcutaneous Q24H  . feeding supplement  1 Container Oral BID BM  . multivitamin with minerals  1 tablet Oral Daily   Continuous Infusions:  PRN Meds: acetaminophen **OR** acetaminophen (TYLENOL) oral liquid 160 mg/5 mL **OR** acetaminophen, senna-docusate   Vital Signs    Vitals:   06/21/17 0830 06/21/17 0835 06/21/17 0840 06/21/17 1253  BP: (!) 117/49 (!) 119/41  (!) 118/43  Pulse: 78 79  83  Resp: 20 (!) 21  16  Temp:    98.7 F (37.1 C)  TempSrc:    Oral  SpO2: 93% 94% 94% 97%  Weight:      Height:       No intake or output data in the 24 hours ending 06/21/17 1255 Filed Weights   06/18/17 1339 06/19/17 0101 06/21/17 0736  Weight: 116 lb (52.6 kg) 104 lb 8 oz (47.4 kg) 104 lb (47.2 kg)    Telemetry    Sinus- Personally Reviewed   Physical Exam   GEN: No acute distress.   Neck: No JVD Cardiac: RRR, no murmurs, rubs, or gallops.  Respiratory: Clear to auscultation bilaterally. GI: Soft, nontender, non-distended  MS: No edema Neuro:  Nonfocal  Psych: Normal affect   Labs    Chemistry Recent Labs  Lab 06/18/17 1351  06/19/17 0552 06/19/17 1037 06/19/17 1313 06/20/17 0526 06/21/17 0609  NA 138   < > 140  --   --  138 138  K 2.8*   < > 3.6  --  3.4* 4.6 4.2  CL 103   < > 108  --   --  106 105  CO2 25  --  23  --   --  25 23  GLUCOSE 108*   < > 94  --   --  96 92  BUN 5*   < > <5*  --   --  5* 5*  CREATININE 0.83   < > 0.73 0.78  --  0.88 0.79  CALCIUM 8.3*  --  8.3*  --   --  8.4* 8.3*  PROT 6.0*  --   --   --   --  5.6*  --   ALBUMIN 2.8*  --   --   --   --  2.6*  --   AST 23  --   --   --   --  20  --     ALT 12*  --   --   --   --  12*  --   ALKPHOS 61  --   --   --   --  56  --   BILITOT 0.6  --   --   --   --  0.4  --   GFRNONAA >60  --  >60 >60  --  >60 >60  GFRAA >60  --  >60 >60  --  >60 >60  ANIONGAP 10  --  9  --   --  7 10   < > = values in this interval not displayed.     Hematology Recent Labs  Lab 06/18/17  1351 06/18/17 1402 06/19/17 1037 06/20/17 0526  WBC 9.5  --  7.9 8.9  RBC 4.27  --  3.97 4.11  HGB 12.0 12.2 10.9* 11.4*  HCT 36.8 36.0 34.3* 35.9*  MCV 86.2  --  86.4 87.3  MCH 28.1  --  27.5 27.7  MCHC 32.6  --  31.8 31.8  RDW 13.9  --  14.3 14.4  PLT 429*  --  370 394     Recent Labs  Lab 06/18/17 1400  TROPIPOC 0.04     Radiology    Ct Abdomen Pelvis W Contrast  Result Date: 06/19/2017 CLINICAL DATA:  83 year old female with newly diagnosed left lower lobe mass. 20-30 pound of recent weight loss. Staging examination. EXAM: CT ABDOMEN AND PELVIS WITH CONTRAST TECHNIQUE: Multidetector CT imaging of the abdomen and pelvis was performed using the standard protocol following bolus administration of intravenous contrast. CONTRAST:  151mL ISOVUE-300 IOPAMIDOL (ISOVUE-300) INJECTION 61% COMPARISON:  No priors. FINDINGS: Lower chest: No change compared to yesterday's chest CT (see report from 06/18/2017). Hepatobiliary: No suspicious cystic or solid hepatic lesions. No intra or extrahepatic biliary ductal dilatation. Tiny partially calcified gallstones lying dependently in the gallbladder. No findings to suggest an acute cholecystitis at this time. Pancreas: No pancreatic mass. No pancreatic ductal dilatation. No pancreatic or peripancreatic fluid or inflammatory changes. Spleen: Multiple linear areas of low attenuation throughout the spleen, most compatible with areas of splenic infarctions. Adrenals/Urinary Tract: 8 mm low-attenuation lesion in the lower pole the left kidney, and 4 mm low-attenuation lesion in the upper pole the right kidney are too small to  characterize, but statistically likely tiny cysts. Mild atrophy of the left kidney. Bilateral adrenal glands are normal in appearance. No hydroureteronephrosis. Urinary bladder is normal in appearance. Stomach/Bowel: The appearance of the stomach is normal. No pathologic dilatation of small bowel or colon. The appendix is not confidently identified and may be surgically absent. Regardless, there are no inflammatory changes noted adjacent to the cecum to suggest the presence of an acute appendicitis at this time. Vascular/Lymphatic: Aortic atherosclerosis, with aneurysmal dilatation of the celiac axis which measures up to 17 mm in diameter. No lymphadenopathy noted in the abdomen or pelvis. Reproductive: Status post hysterectomy. Ovaries are not confidently identified may be surgically absent or atrophic. Other: No significant volume of ascites.  No pneumoperitoneum. Musculoskeletal: Chronic appearing compression fracture of L1 with 30% loss of anterior vertebral body height. There are no aggressive appearing lytic or blastic lesions noted in the visualized portions of the skeleton. IMPRESSION: 1. No definite evidence of metastatic disease noted in the abdomen or pelvis. 2. Multiple splenic infarcts, new compared to prior study from 08/19/2011. 3. 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 4. Mild atrophy of the left kidney. 5. Cholelithiasis without evidence of acute cholecystitis at this time. 6. Additional incidental findings, as above. Electronically Signed   By: Vinnie Langton M.D.   On: 06/19/2017 21:53     Patient Profile     83 year old female with past medical history of coronary artery disease, hypertension, carotid artery disease for evaluation of ? nonsustained ventricular tachycardia and recent CVA.  Patient admitted with left-sided weakness.  Echocardiogram February 2019 showed normal LV function.  MRI showed scattered small acute infarcts in the  bilateral anterior and posterior circulation.  MRA showed no large vessel occlusion or high-grade stenosis.  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  Chest CT  showed left lower lobe mass suggestive of bronchogenic neoplasm.  There was COPD and note of multiple splenic infarcts.  Patient reportedly had runs of nonsustained ventricular tachycardia on telemetry.  However no strips are available to review and I find no documentation of this in reviewing her monitor.   Assessment & Plan     1 CVA-patient is noted to have possible small acute embolic events on her MRI.  There also multiple splenic infarcts noted on her abdominal CT.    However transesophageal echocardiogram showed no source of embolus.  We will plan follow-up with Dr. Johnsie Cancel after discharge.  She will have her pulmonary mass evaluated as an outpatient (appears to be malignant).  If prognosis poor we would not pursue further cardiac evaluation.  Otherwise could consider implantable loop monitor pending results of lung evaluation.    2 Question nonsustained ventricular tachycardia-patient remains in sinus rhythm and I have not found any strips demonstrating nonsustained ventricular tachycardia.  No further evaluation at this point.  LV function normal.    3 left lower lobe lung mass-concerning for malignancy.  Further evaluation per primary service.  We will sign off; please call with questions.  For questions or updates, please contact [place] Please consult [email or web address] for contact info under Cardiology/STEMI.      Signed, Kirk Ruths, MD  06/21/2017, 12:55 PM

## 2017-06-21 NOTE — Progress Notes (Signed)
PT Cancellation Note  Patient Details Name: [name] [name] MRN: [id number] DOB: Dec 11, 1934   Cancelled Treatment:    Reason Eval/Treat Not Completed: Patient at procedure or test/unavailable Pt off floor at TEE. Will follow.   Marguarite Arbour A Zyra Parrillo 06/21/2017, 8:20 AM Wray Kearns, [place], DPT [phone]

## 2017-06-21 NOTE — Interval H&P Note (Signed)
History and Physical Interval Note:  06/21/2017 8:01 AM  [name] [name]  has presented today for surgery, with the diagnosis of stroke  The various methods of treatment have been discussed with the patient and family. After consideration of risks, benefits and other options for treatment, the patient has consented to  Procedure(s): TRANSESOPHAGEAL ECHOCARDIOGRAM (TEE) (N/A) as a surgical intervention .  The patient's history has been reviewed, patient examined, no change in status, stable for surgery.  I have reviewed the patient's chart and labs.  Questions were answered to the patient's satisfaction.     Mertie Moores

## 2017-06-21 NOTE — Progress Notes (Signed)
Patient being D/C to [facility](SNF) in [place], report given to Kell(RN)@1650 . Patients IV's removed intact by Laverda Sorenson). Medications and AVS reviewed with patient/husband. Assist with getting dressing and transfer to car.

## 2017-06-21 NOTE — Clinical Social Work Placement (Signed)
Nurse to call report to [phone], Room [place]  NOTE  Date:  06/21/2017  Patient Details  Name: [name] [name] MRN: [id number] Date of Birth: [birth year]  Clinical Social Work is seeking post-discharge placement for this patient at the [place] level of care (*CSW will initial, date and re-position this form in  chart as items are completed):  Yes   Patient/family provided with [place] Work Department's list of facilities offering this level of care within the geographic area requested by the patient (or if unable, by the patient's family).  Yes   Patient/family informed of their freedom to choose among providers that offer the needed level of care, that participate in Medicare, Medicaid or managed care program needed by the patient, have an available bed and are willing to accept the patient.  Yes   Patient/family informed of [facility]'s ownership interest in [facility] and [facility], as well as of the fact that they are under no obligation to receive care at these facilities.  PASRR submitted to EDS on 06/21/17     PASRR number received on 06/21/17     Existing PASRR number confirmed on       FL2 transmitted to all facilities in geographic area requested by pt/family on 06/21/17     FL2 transmitted to all facilities within larger geographic area on       Patient informed that his/her managed care company has contracts with or will negotiate with certain facilities, including the following:        Yes   Patient/family informed of bed offers received.  Patient chooses bed at [facility], [place]     Physician recommends and patient chooses bed at      Patient to be transferred to [facility], [place] on 06/21/17.  Patient to be transferred to facility by Family car     Patient family notified on 06/21/17 of transfer.  Name of family member notified:  [name]     PHYSICIAN        Additional Comment:    _______________________________________________ [name], LCSW 06/21/2017, 4:30 PM

## 2017-06-21 NOTE — Care Management Note (Signed)
Case Management Note  Patient Details  Name: [name] [name] MRN: [id number] Date of Birth: 09-26-1934  Subjective/Objective:                    Action/Plan: Pt discharging to [facility] today. CM signing off.  Expected Discharge Date:  06/21/17               Expected Discharge Plan:  Skilled Nursing Facility  In-House Referral:  Clinical Social Work  Discharge planning Services     Post Acute Care Choice:    Choice offered to:     DME Arranged:    DME Agency:     HH Arranged:    [place] Agency:     Status of Service:  Completed, signed off  If discussed at [employer], dates discussed:    Additional Comments:  Pollie Friar, RN 06/21/2017, 12:40 PM

## 2017-06-21 NOTE — Discharge Summary (Signed)
Physician Discharge Summary  Mckenna Gamm LZJ:[id number] DOB: 1934-08-12 DOA: 06/18/2017  PCP: Leonard Downing, MD  Admit date: 06/18/2017 Discharge date: 06/21/2017  Admitted From: Home Disposition:  SNF  Recommendations for Outpatient Follow-up:  1. Follow up with provider at SNF at earliest convenient 2. Follow-up with neurology 3. Follow-up with pulmonary/Dr. Vaughan Browner on July 13, 2017 at 10:30 AM  Home Health: No Equipment/Devices: None  Discharge Condition: Guarded CODE STATUS: Full Diet recommendation: Heart Healthy   Brief/Interim Summary: 83 year old female with history of hypertension and CAD with remote stent presented with weakness.  CT of the head was unremarkable.  MRI of the brain revealed numerous small acute infarcts.  Neurology was consulted.  CT of the chest showed left lower lobe mass.  Patient symptoms are improving.  She has tolerated her diet and is tolerating PT.  She underwent TEE today which was unremarkable.  She will be discharged to nursing home with outpatient follow-up with neurology.  She will need outpatient follow-up with pulmonary.   Discharge Diagnoses:  Principal Problem:   Acute embolic stroke [facility]) Active Problems:   Essential hypertension   Mass of lower lobe of left lung   Hypokalemia   Ischemic stroke (HCC)   NSVT (nonsustained ventricular tachycardia) ([place])   1.Acute CVA -CT was negative for acute intracranial abnormality, but MRI brain reveals numerous small acute infarcts scattered about the bilateral anterior and posterior circulation -Neurology following -Had recent TTE on 05/29/2017 which showed EF of 60-65% with grade 1 diastolic dysfunction.   -TEE done today was unremarkable -MRA of the head did not show any large vessel occlusion -Continue ASA and statin. -Patient tolerating diet. -PT OT recommended CIR.   patient and family wants to go to nursing home.  Discharge patient today if bed is available. -Carotid  ultrasound: 1-39% ICA stenosis -Outpatient follow-up with neurology   2.Left lung mass -Chest CT reveals LLL mass with highly-aggressive appearance, underlying COPD changes  -As per IR, lung mass biopsy will have to be performed as an outpatient after pulmonary consult and/or CT surgery evaluation -CT of the abdomen was negative for any metastatic disease -Outpatient evaluation with pulmonary has been set up for April 10 at 1030 with Dr. Vaughan Browner   3.Hypokalemia -Improved.    Outpatient follow-up  4.Hypertension -Pressure stable.  Outpatient follow-up  5.   Possible Ventricular tachycardia noted on telemetry -Cardiology evaluation  Appreciated.  Cardiology thought that patient did not have ventricular tachycardia.  Outpatient follow-up.     Discharge Instructions  Discharge Instructions    Ambulatory referral to Neurology   Complete by:  As directed    An appointment is requested in approximately: Few weeks   Call MD for:  difficulty breathing, headache or visual disturbances   Complete by:  As directed    Call MD for:  extreme fatigue   Complete by:  As directed    Call MD for:  hives   Complete by:  As directed    Call MD for:  persistant dizziness or light-headedness   Complete by:  As directed    Call MD for:  persistant nausea and vomiting   Complete by:  As directed    Call MD for:  severe uncontrolled pain   Complete by:  As directed    Call MD for:  temperature >100.4   Complete by:  As directed    Diet - low sodium heart healthy   Complete by:  As directed    Increase activity slowly  Complete by:  As directed      Allergies as of 06/21/2017      Reactions   Other Anaphylaxis   Food or preservative allergy of unknown/undetermined origin (happened last in 2017 when the patient had gone out to eat and hadn't yet touched her steak??)      Medication List    STOP taking these medications   ondansetron 4 MG disintegrating tablet Commonly  known as:  ZOFRAN ODT     TAKE these medications   aspirin 81 MG tablet Take 1 tablet (81 mg total) by mouth daily. What changed:  when to take this   atorvastatin 20 MG tablet Commonly known as:  LIPITOR Take 1 tablet (20 mg total) by mouth daily at 6 PM.   EPIPEN 2-PAK 0.3 mg/0.3 mL Soaj injection Generic drug:  EPINEPHrine Inject 0.3 mg into the muscle once as needed (for anaphylaxis).   felodipine 10 MG 24 hr tablet Commonly known as:  PLENDIL Take 10 mg by mouth daily.   multivitamin with minerals Tabs tablet Take 1 tablet by mouth daily.   nitroGLYCERIN 0.4 MG SL tablet Commonly known as:  NITROSTAT Place 1 tablet (0.4 mg total) under the tongue every 5 (five) minutes as needed for chest pain.      Follow-up Information    Leonard Downing, MD. Schedule an appointment as soon as possible for a visit in 1 week(s).   Specialty:  Family Medicine Contact information: [place] [address] [phone]        Josue Hector, MD Follow up.   Specialty:  Cardiology Contact information: [address] [address] [phone]        Marshell Garfinkel, MD Follow up.   Specialty:  Pulmonary Disease Why:  Keep scheduled appointment.  Patient has appointment on April 10 at 10:30 AM Contact information: [place] [address] [phone]          Allergies  Allergen Reactions  . Other Anaphylaxis    Food or preservative allergy of unknown/undetermined origin (happened last in 2017 when the patient had gone out to eat and hadn't yet touched her steak??)    Consultations:  Neurology/cardiology   Procedures/Studies: Dg Chest 2 View  Result Date: 06/18/2017 CLINICAL DATA:  83 year old female with history of progressive weakness for 1 month. EXAM: CHEST - 2 VIEW COMPARISON:  Chest x-ray 05/08/2017. FINDINGS: Lung volumes are normal. In the lower left hemithorax there is a 3.0 x  2.6 cm mass-like opacity, which is not confidently identified on lateral projection. This appears intimately associated with the anterior aspect of the left fourth rib, and could simply reflect a healing rib fracture, however, the possibility of a primary pulmonary lesion is not excluded. Alternatively, this could reflect in rounded area of pneumonia. No other consolidative airspace disease noted. No pleural effusions. No evidence of pulmonary edema. Heart size is normal. Upper mediastinal contours are within normal limits. Aortic atherosclerosis. IMPRESSION: 1. New mass-like opacity projecting over the lower left hemithorax seen only on the frontal projection. This is of uncertain etiology and significance, and could be infectious/inflammatory or neoplastic, or could alternatively reflect a healing fracture of the anterior aspect of the left fourth rib. Further evaluation with chest CT (preferably with IV contrast) is recommended in the near future to better evaluate this finding. 2. Aortic atherosclerosis. Electronically Signed   By: Vinnie Langton M.D.   On: 06/18/2017 16:41  Ct Head Wo Contrast  Result Date: 06/18/2017 CLINICAL DATA:  Complaints of progressive weakness.  Recent UTI. EXAM: CT HEAD WITHOUT CONTRAST TECHNIQUE: Contiguous axial images were obtained from the base of the skull through the vertex without intravenous contrast. COMPARISON:  05/01/2016. FINDINGS: Brain: No evidence for acute infarction, hemorrhage, mass lesion, hydrocephalus, or extra-axial fluid. Generalized atrophy. Chronic microvascular ischemic change. Chronic RIGHT basal ganglia lacunar infarct. Vascular: Calcification of the cavernous internal carotid arteries consistent with cerebrovascular atherosclerotic disease. No signs of intracranial large vessel occlusion. Skull: Normal. Negative for fracture or focal lesion. Sinuses/Orbits: No acute finding. Other: None. IMPRESSION: Chronic changes as described. Progressive atrophy  since 2018. No acute intracranial findings. Electronically Signed   By: Staci Righter M.D.   On: 06/18/2017 15:17   Ct Chest W Contrast  Result Date: 06/18/2017 CLINICAL DATA:  83 year old female with history of abnormal chest x-ray suspicious for pulmonary mass. EXAM: CT CHEST WITH CONTRAST TECHNIQUE: Multidetector CT imaging of the chest was performed during intravenous contrast administration. CONTRAST:  75 mL ISOVUE-300 IOPAMIDOL (ISOVUE-300) INJECTION 61% COMPARISON:  Chest CT 08/19/2011. FINDINGS: Cardiovascular: Heart size is normal. There is no significant pericardial fluid, thickening or pericardial calcification. There is aortic atherosclerosis, as well as atherosclerosis of the great vessels of the mediastinum and the coronary arteries, including calcified atherosclerotic plaque in the left main, left anterior descending, left circumflex and right coronary arteries. Mediastinum/Nodes: No pathologically enlarged mediastinal or hilar lymph nodes. Esophagus is unremarkable in appearance. No axillary lymphadenopathy. Lungs/Pleura: New left lower lobe mass measuring 3.0 x 2.2 x 2.0 cm (coronal image 44 of series 5 and axial image 105 of series 7), which has macrolobulated slightly spiculated margins, and is in contact with the right major fissure which is slightly retracted toward the lesion. Tiny right upper lobe calcified granuloma. A few scattered tiny 2-3 mm pulmonary nodules throughout the lungs bilaterally. No acute consolidative airspace disease. No pleural effusions. Diffuse bronchial wall thickening with mild centrilobular and paraseptal emphysema. Upper Abdomen: Irregular areas of hypoattenuation throughout the spleen, new compared to the prior examination, likely to reflect multifocal splenic infarctions. Small calcified granuloma in the liver. 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 Musculoskeletal: Chronic appearing compression fractures of T6 and L1, most severe at T6  where there is 30% loss of anterior vertebral body height. There are no aggressive appearing lytic or blastic lesions noted in the visualized portions of the skeleton. IMPRESSION: 1. The finding on the recent chest radiograph corresponds to a 3.0 x 2.2 x 2.0 cm left lower lobe mass which is highly aggressive in appearance, very likely to represent a primary bronchogenic neoplasm. This is intimately associated with the left major fissure, but there is no associated mediastinal or hilar lymphadenopathy noted at this time. Further evaluation with PET-CT is recommended in the near future for additional diagnostic and staging purposes. 2. Multiple tiny 2-3 mm pulmonary nodules scattered throughout the lungs bilaterally, nonspecific and statistically favored to represent tiny areas of mucoid impaction within terminal bronchioles. Attention on any future follow-up imaging is recommended to ensure stability or regression. 3. Diffuse bronchial wall thickening with mild centrilobular and paraseptal emphysema; imaging findings suggestive of underlying COPD. 4. Aortic atherosclerosis, in addition to left main and 3 vessel coronary artery disease. 5. Multiple splenic infarcts. 6. 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 Aortic Atherosclerosis (ICD10-I70.0) and Emphysema (ICD10-J43.9). Electronically Signed   By: Vinnie Langton M.D.   On: 06/18/2017 20:26  Mr Jeri Cos And Wo Contrast  Result Date: 06/18/2017 CLINICAL DATA:  83 year old female with vomiting for 2 days. Left side weakness onset yesterday. Possible left lung mass. EXAM: MRI HEAD WITHOUT AND WITH CONTRAST TECHNIQUE: Multiplanar, multiecho pulse sequences of the brain and surrounding structures were obtained without and with intravenous contrast. CONTRAST:  47mL MULTIHANCE GADOBENATE DIMEGLUMINE 529 MG/ML IV SOLN COMPARISON:  Head CT without contrast 1429 hours today, 05/01/2016. FINDINGS: Brain: There are numerous small foci of restricted  diffusion scattered in both cerebral and cerebellar hemispheres. The largest areas of involvement are in the posterior right cerebellum (series 3, image 8), left cerebellar peduncle (image 12), and posterior right insula (image 24). Bilateral anterior and posterior vascular territories are affected. The deep gray matter nuclei are spared. Mild associated T2 and FLAIR hyperintensity at the larger areas of involvement. No associated hemorrhage or mass effect. No associated abnormal enhancement. Underlying widespread and confluent bilateral cerebral white matter T2 and FLAIR hyperintensity. Underlying chronic lacunar infarcts of the right basal ganglia. No chronic cerebral blood products identified. No midline shift, mass effect, evidence of mass lesion, ventriculomegaly, extra-axial collection or acute intracranial hemorrhage. Cervicomedullary junction and pituitary are within normal limits. No dural thickening. Vascular: Major intracranial vascular flow voids are preserved. The major dural venous sinuses are enhancing and appear patent. Skull and upper cervical spine: Lower cervical spine disc and endplate degeneration with up to mild degenerative spinal stenosis. Normal bone marrow signal. Sinuses/Orbits: Normal orbits soft tissues. The paranasal sinuses are clear. Other: Trace mastoid air cell fluid. Negative nasopharynx. Visible internal auditory structures appear normal. Negative orbit and scalp soft tissues. IMPRESSION: 1. Numerous small acute infarcts scattered in the bilateral anterior and posterior circulation, suggesting a recent embolic event from the heart or proximal aorta. 2. No associated hemorrhage or mass effect. 3. Underlying chronic small vessel disease. 4. No abnormal intracranial enhancement or metastatic disease identified. Electronically Signed   By: Genevie Ann M.D.   On: 06/18/2017 18:43   Ct Abdomen Pelvis W Contrast  Result Date: 06/19/2017 CLINICAL DATA:  83 year old female with newly  diagnosed left lower lobe mass. 20-30 pound of recent weight loss. Staging examination. EXAM: CT ABDOMEN AND PELVIS WITH CONTRAST TECHNIQUE: Multidetector CT imaging of the abdomen and pelvis was performed using the standard protocol following bolus administration of intravenous contrast. CONTRAST:  144mL ISOVUE-300 IOPAMIDOL (ISOVUE-300) INJECTION 61% COMPARISON:  No priors. FINDINGS: Lower chest: No change compared to yesterday's chest CT (see report from 06/18/2017). Hepatobiliary: No suspicious cystic or solid hepatic lesions. No intra or extrahepatic biliary ductal dilatation. Tiny partially calcified gallstones lying dependently in the gallbladder. No findings to suggest an acute cholecystitis at this time. Pancreas: No pancreatic mass. No pancreatic ductal dilatation. No pancreatic or peripancreatic fluid or inflammatory changes. Spleen: Multiple linear areas of low attenuation throughout the spleen, most compatible with areas of splenic infarctions. Adrenals/Urinary Tract: 8 mm low-attenuation lesion in the lower pole the left kidney, and 4 mm low-attenuation lesion in the upper pole the right kidney are too small to characterize, but statistically likely tiny cysts. Mild atrophy of the left kidney. Bilateral adrenal glands are normal in appearance. No hydroureteronephrosis. Urinary bladder is normal in appearance. Stomach/Bowel: The appearance of the stomach is normal. No pathologic dilatation of small bowel or colon. The appendix is not confidently identified and may be surgically absent. Regardless, there are no inflammatory changes noted adjacent to the cecum to suggest the presence of an acute appendicitis at this time. Vascular/Lymphatic:  Aortic atherosclerosis, with aneurysmal dilatation of the celiac axis which measures up to 17 mm in diameter. No lymphadenopathy noted in the abdomen or pelvis. Reproductive: Status post hysterectomy. Ovaries are not confidently identified may be surgically absent or  atrophic. Other: No significant volume of ascites.  No pneumoperitoneum. Musculoskeletal: Chronic appearing compression fracture of L1 with 30% loss of anterior vertebral body height. There are no aggressive appearing lytic or blastic lesions noted in the visualized portions of the skeleton. IMPRESSION: 1. No definite evidence of metastatic disease noted in the abdomen or pelvis. 2. Multiple splenic infarcts, new compared to prior study from 08/19/2011. 3. 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 4. Mild atrophy of the left kidney. 5. Cholelithiasis without evidence of acute cholecystitis at this time. 6. Additional incidental findings, as above. Electronically Signed   By: Vinnie Langton M.D.   On: 06/19/2017 21:53   Mr Jodene Nam Head Wo Contrast  Result Date: 06/19/2017 CLINICAL DATA:  Stroke follow-up EXAM: MRA HEAD WITHOUT CONTRAST TECHNIQUE: Angiographic images of the Circle of Willis were obtained using MRA technique without intravenous contrast. COMPARISON:  Brain MRI 06/18/2017 FINDINGS: Intracranial internal carotid arteries: Small medially projecting aneurysm from the distal cavernous segment of the left internal carotid artery, measuring approximately 3 x 2 mm. Normal right internal carotid artery. Anterior cerebral arteries: Normal. Middle cerebral arteries: Mild stenosis of the right middle cerebral artery M1 segment. Otherwise normal middle cerebral arteries. Posterior communicating arteries: Present bilaterally. Posterior cerebral arteries: Normal. Basilar artery: Normal. Vertebral arteries: Left dominant. Normal. Superior cerebellar arteries: Normal. Anterior inferior cerebellar arteries: Normal. Posterior inferior cerebellar arteries: Normal. IMPRESSION: 1. No emergent large vessel occlusion or high-grade stenosis. Mild narrowing of the right middle cerebral artery M1 segment. 2. 3 x 2 mm aneurysm projecting medially from the distal cavernous segment of the  left internal carotid artery. Electronically Signed   By: Ulyses Jarred M.D.   On: 06/19/2017 00:57    TEE on 06/21/2017 Left Ventrical:  Normal LV function   Mitral Valve: ? Slight prolapse.   ( images off axis)  No MR   Aortic Valve: normal   Tricuspid Valve: normal,  Trace TR   Pulmonic Valve: trace PI  Left Atrium/ Left atrial appendage: no LAA thrombus   Atrial septum: no ASD or PFO by bubble contrast or color dopplers   Aorta: not visualized ( probe malfunctioned at the end of the case )    Carotid ultrasound on 06/20/2017: 1-39% ICA stenosis   Subjective: Patient seen and examined at bedside.  She denies overnight fever, nausea or vomiting.  Tolerating diet  Discharge Exam: Vitals:   06/21/17 0835 06/21/17 0840  BP: (!) 119/41   Pulse: 79   Resp: (!) 21   Temp:    SpO2: 94% 94%   Vitals:   06/21/17 0825 06/21/17 0830 06/21/17 0835 06/21/17 0840  BP: 127/67 (!) 117/49 (!) 119/41   Pulse: 80 78 79   Resp: (!) 21 20 (!) 21   Temp: 98.2 F (36.8 C)     TempSrc: Oral     SpO2: 98% 93% 94% 94%  Weight:      Height:        General: Pt is alert, awake, not in acute distress Cardiovascular: Rate controlled, S1/S2 + Respiratory: Bilateral decreased breath sounds at bases Abdominal: Soft, NT, ND, bowel sounds + Extremities: no edema, no cyanosis    The results of significant diagnostics from this hospitalization (  including imaging, microbiology, ancillary and laboratory) are listed below for reference.     Microbiology: Recent Results (from the past 240 hour(s))  Urine culture     Status: Abnormal   Collection Time: 06/18/17  8:48 PM  Result Value Ref Range Status   Specimen Description URINE, CATHETERIZED  Final   Special Requests   Final    NONE Performed at [facility], [address] [place], [place] [address]    Culture MULTIPLE SPECIES PRESENT, SUGGEST RECOLLECTION (A)  Final   Report Status 06/20/2017 FINAL  Final      Labs: BNP (last 3 results) No results for input(s): BNP in the last 8760 hours. Basic Metabolic Panel: Recent Labs  Lab 06/18/17 1351 06/18/17 1402 06/18/17 1913 06/19/17 0552 06/19/17 0834 06/19/17 1037 06/19/17 1313 06/20/17 0526 06/21/17 0609  NA 138 142  --  140  --   --   --  138 138  K 2.8* 2.9*  --  3.6  --   --  3.4* 4.6 4.2  CL 103 103  --  108  --   --   --  106 105  CO2 25  --   --  23  --   --   --  25 23  GLUCOSE 108* 103*  --  94  --   --   --  96 92  BUN 5* 3*  --  <5*  --   --   --  5* 5*  CREATININE 0.83 0.70  --  0.73  --  0.78  --  0.88 0.79  CALCIUM 8.3*  --   --  8.3*  --   --   --  8.4* 8.3*  MG  --   --  1.8  --  2.8*  --  2.6* 2.3  --    Liver Function Tests: Recent Labs  Lab 06/18/17 1351 06/20/17 0526  AST 23 20  ALT 12* 12*  ALKPHOS 61 56  BILITOT 0.6 0.4  PROT 6.0* 5.6*  ALBUMIN 2.8* 2.6*   No results for input(s): LIPASE, AMYLASE in the last 168 hours. No results for input(s): AMMONIA in the last 168 hours. CBC: Recent Labs  Lab 06/18/17 1351 06/18/17 1402 06/19/17 1037 06/20/17 0526  WBC 9.5  --  7.9 8.9  NEUTROABS 5.1  --   --  4.5  HGB 12.0 12.2 10.9* 11.4*  HCT 36.8 36.0 34.3* 35.9*  MCV 86.2  --  86.4 87.3  PLT 429*  --  370 394   Cardiac Enzymes: No results for input(s): CKTOTAL, CKMB, CKMBINDEX, TROPONINI in the last 168 hours. BNP: Invalid input(s): POCBNP CBG: Recent Labs  Lab 06/18/17 1356 06/19/17 0517  GLUCAP 104* 98   D-Dimer No results for input(s): DDIMER in the last 72 hours. Hgb A1c Recent Labs    06/18/17 1913  HGBA1C 5.4   Lipid Profile Recent Labs    06/18/17 1913 06/19/17 0552  CHOL 182 162  HDL 52 46  LDLCALC 109* 96  TRIG 105 101  CHOLHDL 3.5 3.5   Thyroid function studies Recent Labs    06/20/17 1255  TSH 1.441   Anemia work up No results for input(s): VITAMINB12, FOLATE, FERRITIN, TIBC, IRON, RETICCTPCT in the last 72 hours. Urinalysis    Component Value Date/Time    COLORURINE STRAW (A) 06/18/2017 2048   APPEARANCEUR CLEAR 06/18/2017 2048   LABSPEC 1.019 06/18/2017 2048   PHURINE 7.0 06/18/2017 2048   GLUCOSEU NEGATIVE 06/18/2017  [place] 06/18/2017 2048   BILIRUBINUR NEGATIVE 06/18/2017 2048   KETONESUR NEGATIVE 06/18/2017 2048   PROTEINUR NEGATIVE 06/18/2017 2048   UROBILINOGEN 0.2 08/23/2014 1502   NITRITE NEGATIVE 06/18/2017 2048   LEUKOCYTESUR NEGATIVE 06/18/2017 2048   Sepsis Labs Invalid input(s): PROCALCITONIN,  WBC,  LACTICIDVEN Microbiology Recent Results (from the past 240 hour(s))  Urine culture     Status: Abnormal   Collection Time: 06/18/17  8:48 PM  Result Value Ref Range Status   Specimen Description URINE, CATHETERIZED  Final   Special Requests   Final    NONE Performed at [facility], [address], [place], [place] [address]    Culture MULTIPLE SPECIES PRESENT, SUGGEST RECOLLECTION (A)  Final   Report Status 06/20/2017 FINAL  Final     Time coordinating discharge: 35 minutes  SIGNED:   Aline August, MD  [facility] 06/21/2017, 10:58 AM Pager: [phone]  If 7PM-7AM, please contact night-coverage [email or web address] Password [email or web address]

## 2017-06-21 NOTE — CV Procedure (Signed)
    Transesophageal Echocardiogram Note  Wanona Stare [id number] 1934-04-06  Procedure: Transesophageal Echocardiogram Indications: CVA   Procedure Details Consent: Obtained Time Out: Verified patient identification, verified procedure, site/side was marked, verified correct patient position, special equipment/implants available, Radiology Safety Procedures followed,  medications/allergies/relevent history reviewed, required imaging and test results available.  Performed  Medications:  During this procedure the patient is administered a total of Versed 2  mg   to achieve and maintain moderate conscious sedation.  The patient's heart rate, blood pressure, and oxygen saturation are monitored continuously during the procedure. The period of conscious sedation is  30  minutes, of which I was present face-to-face 100% of this time.  The heart is roatated.   Very challenging images.   Left Ventrical:  Normal LV function   Mitral Valve: ? Slight prolapse.   ( images off axis)  No MR   Aortic Valve: normal   Tricuspid Valve: normal,  Trace TR   Pulmonic Valve: trace PI  Left Atrium/ Left atrial appendage: no LAA thrombus   Atrial septum: no ASD or PFO by bubble contrast or color dopplers   Aorta: not visualized ( probe malfunctioned at the end of the case )    Complications: No apparent complications Patient did tolerate procedure well.   Thayer Headings, Brooke Bonito., MD, [facility] 06/21/2017, 8:20 AM

## 2017-06-21 NOTE — Progress Notes (Signed)
STROKE TEAM PROGRESS NOTE   HISTORY OF PRESENT ILLNESS (per record) [name] [name] is an 83 y.o. female [place] a PMH of HTN, HLD and CAD who presents with worsening left sided weakness since yesterday. Husband states he first noticed her worsening weakness yesterday when he was helping her after she fell in the bathroom. She sustained no injury and was able to ambulate back to bed but he noticed that her left leg appeared weaker than her Right. The husband reports the patient has been sick with a UTI for over one month. He states she has some mild cognitive decline over the past year. The patient states she has had generalized weakness since she started antibiotics for the UTI.  Patient denies any H/A, visual changes, no CP or SOB. Husband denies noticing any slurred speech or worsening confusion  Date last known well: Date: 06/18/2017 Time last known well: unknown tPA Given: No: outside the window Modified Rankin: Rankin Score=1 NIH Stroke Scale: 2   SUBJECTIVE (INTERVAL HISTORY) Her  Husband is at the bedside.    Both carotid ultrasound and TEE have not yet been done. No neurological changes. No new complaints.   OBJECTIVE Temp:  [98 F (36.7 C)-98.7 F (37.1 C)] 98.7 F (37.1 C) (03/19 1253) Pulse Rate:  [69-87] 83 (03/19 1253) Cardiac Rhythm: Normal sinus rhythm (03/18 2000) Resp:  [16-52] 16 (03/19 1253) BP: (104-139)/(41-82) 118/43 (03/19 1253) SpO2:  [93 %-100 %] 97 % (03/19 1253) Weight:  [104 lb (47.2 kg)] 104 lb (47.2 kg) (03/19 0736)  CBC:  Recent Labs  Lab 06/18/17 1351  06/19/17 1037 06/20/17 0526  WBC 9.5  --  7.9 8.9  NEUTROABS 5.1  --   --  4.5  HGB 12.0   < > 10.9* 11.4*  HCT 36.8   < > 34.3* 35.9*  MCV 86.2  --  86.4 87.3  PLT 429*  --  370 394   < > = values in this interval not displayed.    Basic Metabolic Panel:  Recent Labs  Lab 06/19/17 1313 06/20/17 0526 06/21/17 0609  NA  --  138 138  K 3.4* 4.6 4.2  CL  --  106 105  CO2  --  25 23   GLUCOSE  --  96 92  BUN  --  5* 5*  CREATININE  --  0.88 0.79  CALCIUM  --  8.4* 8.3*  MG 2.6* 2.3  --     Lipid Panel:     Component Value Date/Time   CHOL 162 06/19/2017 0552   TRIG 101 06/19/2017 0552   HDL 46 06/19/2017 0552   CHOLHDL 3.5 06/19/2017 0552   VLDL 20 06/19/2017 0552   LDLCALC 96 06/19/2017 0552   HgbA1c:  Lab Results  Component Value Date   HGBA1C 5.4 06/18/2017   Urine Drug Screen: No results found for: LABOPIA, COCAINSCRNUR, LABBENZ, AMPHETMU, THCU, LABBARB  Alcohol Level No results found for: [facility]  IMAGING  Dg Chest 2 View 06/18/2017 IMPRESSION:  1. New mass-like opacity projecting over the lower left hemithorax seen only on the frontal projection. This is of uncertain etiology and significance, and could be infectious/inflammatory or neoplastic, or could alternatively reflect a healing fracture of the anterior aspect of the left fourth rib. Further evaluation with chest CT (preferably with IV contrast) is recommended in the near future to better evaluate this finding.  2. Aortic atherosclerosis.    Ct Head Wo Contrast 06/18/2017 IMPRESSION:  Chronic changes as described. Progressive atrophy  since 2018. No acute intracranial findings.    Ct Chest W Contrast 06/18/2017  IMPRESSION:  1. The finding on the recent chest radiograph corresponds to a 3.0 x 2.2 x 2.0 cm left lower lobe mass which is highly aggressive in appearance, very likely to represent a primary bronchogenic neoplasm. This is intimately associated with the left major fissure, but there is no associated mediastinal or hilar lymphadenopathy noted at this time. Further evaluation with PET-CT is recommended in the near future for additional diagnostic and staging purposes.  2. Multiple tiny 2-3 mm pulmonary nodules scattered throughout the lungs bilaterally, nonspecific and statistically favored to represent tiny areas of mucoid impaction within terminal bronchioles. Attention on any future  follow-up imaging is recommended to ensure stability or regression.  3. Diffuse bronchial wall thickening with mild centrilobular and paraseptal emphysema; imaging findings suggestive of underlying COPD.  4. Aortic atherosclerosis, in addition to left main and 3 vessel coronary artery disease.  5. Multiple splenic infarcts.  6. 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 Aortic Atherosclerosis (ICD10-I70.0) and Emphysema (ICD10-J43.9).     Mr Jeri Cos And Wo Contrast 06/18/2017 IMPRESSION:  1. Numerous small acute infarcts scattered in the bilateral anterior and posterior circulation, suggesting a recent embolic event from the heart or proximal aorta.  2. No associated hemorrhage or mass effect.  3. Underlying chronic small vessel disease.  4. No abnormal intracranial enhancement or metastatic disease identified.   Mr Jodene Nam Head Wo Contrast 06/19/2017 IMPRESSION:  1. No emergent large vessel occlusion or high-grade stenosis. Mild narrowing of the right middle cerebral artery M1 segment.  2. 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     Transthoracic Echocardiogram  05/09/2017 Study Conclusions - Left ventricle: The cavity size was normal. Wall thickness was   normal. Systolic function was normal. The estimated ejection   fraction was in the range of 60% to 65%. Wall motion was normal;   there were no regional wall motion abnormalities. Doppler   parameters are consistent with abnormal left ventricular   relaxation (grade 1 diastolic dysfunction).    Bilateral Carotid Dopplers - pending 00/00/00     PHYSICAL EXAM Vitals:   06/21/17 0830 06/21/17 0835 06/21/17 0840 06/21/17 1253  BP: (!) 117/49 (!) 119/41  (!) 118/43  Pulse: 78 79  83  Resp: 20 (!) 21  16  Temp:    98.7 F (37.1 C)  TempSrc:    Oral  SpO2: 93% 94% 94% 97%  Weight:      Height:       Frail pleasant elderly Caucasian  lady who is cachectic.  Not in distress. . Afebrile. Head is nontraumatic. Neck is supple without bruit.    Cardiac exam no murmur or gallop. Lungs are clear to auscultation. Distal pulses are well felt. Neurological Exam ;  Awake  Alert oriented x 3. Normal speech and language.eye movements full without nystagmus.fundi were not visualized. Vision acuity and fields appear normal. Hearing is normal. Palatal movements are normal. Face symmetric. Tongue midline. Normal symmetric 4/5 strength, tone, reflexes and coordination. Normal sensation. Gait deferred.    ASSESSMENT/PLAN [name] [name] is a 83 y.o. female with history of pneumonia, hyperlipidemia, recent UTI, cognitive decline times 1 year, hypertension, coronary artery disease, and anxiety presenting with left-sided weakness. She did not receive IV t-PA due to late presentation.  Stroke: Multiple small acute bilateral scattered infarcts likely embolic etiology  unknown.  Resultant generalized weakness  CT head - Progressive atrophy since 2018. No acute intracranial findings.   MRI head - Numerous small acute infarcts scattered in the bilateral anterior and posterior circulation, suggesting a recent embolic event from the heart or proximal aorta.   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  Carotid Doppler - pending  2D Echo - 05/09/2017 -unremarkable  LDL - 96  HgbA1c - 5.4  VTE prophylaxis -Lovenox Fall precautions Diet Heart Room service appropriate? Yes; Fluid consistency: Thin Diet - low sodium heart healthy  aspirin 81 mg daily prior to admission, now on aspirin 81 mg daily  Patient counseled to be compliant with her antithrombotic medications  Ongoing aggressive stroke risk factor management  Therapy recommendations:  CIR recommended  Disposition:  Pending  Hypertension  Stable  Permissive hypertension (OK if < 220/120) but gradually normalize in 5-7 days  Long-term BP goal  normotensive  Hyperlipidemia  Home meds: No lipid-lowering medications prior to admission  LDL 96, goal < 70  Now on Lipitor 20 mg daily  Continue statin at discharge   Other Stroke Risk Factors  Advanced age  Former cigarette smoker - quit 6 years ago  Coronary artery disease   Other Active Problems  Abnormal chest x-ray consistent with primary bronchogenic neoplasm. Workup in progress.  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   Plan / Recommendations   Check the for marantic endocarditis from suspected lung cancer versus other cardiac source of embolism.  No need to consider loop recorder at the present time as she may not be a long-term candidate for anticoagulation if diagnosis of lung cancer is confirmed  Continue aspirin for now.  Will not add Plavix as she is likely to need lung biopsy.      Hospital day # 3  I have personally examined this patient, reviewed notes, independently viewed imaging studies, participated in medical decision making and plan of care.ROS completed by me personally and pertinent positives fully documented  I have made any additions or clarifications directly to the above note.  She has presented with by cerebral embolic infarcts etiology indeterminate as to brain metastasis from lung cancer versus marantic endocarditis or cardiac source of embolism.  Recommend check TEE but no need for loop recorder at the present time unless diagnosis of cancer has been ruled out.  Long discussion with the patient and Dr.Kshitz and answered questions.   Antony Contras, MD Medical Director [facility] Stroke Center Pager: [phone] 06/21/2017 1:56 PM   To contact Stroke Continuity provider, please refer to [email or web address] After hours, contact General Neurology STROKE TEAM PROGRESS NOTE   HISTORY OF PRESENT ILLNESS (per record) [name] [name] is an 83 y.o. female  [place] a PMH of HTN, HLD and CAD who presents with worsening left sided weakness since yesterday. Husband states he first noticed her worsening weakness yesterday when he was helping her after she fell in the bathroom. She sustained no injury and was able to ambulate back to bed but he noticed that her left leg appeared weaker than her Right. The husband reports the patient has been sick with a UTI for over one month. He states she has some mild cognitive decline over the past year. The patient states she has had generalized weakness since she started antibiotics for the UTI.  Patient denies any H/A, visual changes, no CP or SOB. Husband denies noticing  any slurred speech or worsening confusion  Date last known well: Date: 06/18/2017 Time last known well: unknown tPA Given: No: outside the window Modified Rankin: Rankin Score=1 NIH Stroke Scale: 2   SUBJECTIVE (INTERVAL HISTORY) Her  Husband is at the bedside.    Both carotid ultrasound and TEE have not yet been done. No neurological changes. No new complaints.   OBJECTIVE Temp:  [98 F (36.7 C)-98.7 F (37.1 C)] 98.7 F (37.1 C) (03/19 1253) Pulse Rate:  [69-87] 83 (03/19 1253) Cardiac Rhythm: Normal sinus rhythm (03/18 2000) Resp:  [16-52] 16 (03/19 1253) BP: (104-139)/(41-82) 118/43 (03/19 1253) SpO2:  [93 %-100 %] 97 % (03/19 1253) Weight:  [104 lb (47.2 kg)] 104 lb (47.2 kg) (03/19 0736)  CBC:  Recent Labs  Lab 06/18/17 1351  06/19/17 1037 06/20/17 0526  WBC 9.5  --  7.9 8.9  NEUTROABS 5.1  --   --  4.5  HGB 12.0   < > 10.9* 11.4*  HCT 36.8   < > 34.3* 35.9*  MCV 86.2  --  86.4 87.3  PLT 429*  --  370 394   < > = values in this interval not displayed.    Basic Metabolic Panel:  Recent Labs  Lab 06/19/17 1313 06/20/17 0526 06/21/17 0609  NA  --  138 138  K 3.4* 4.6 4.2  CL  --  106 105  CO2  --  25 23  GLUCOSE  --  96 92  BUN  --  5* 5*  CREATININE  --  0.88 0.79  CALCIUM  --  8.4* 8.3*  MG 2.6* 2.3  --      Lipid Panel:     Component Value Date/Time   CHOL 162 06/19/2017 0552   TRIG 101 06/19/2017 0552   HDL 46 06/19/2017 0552   CHOLHDL 3.5 06/19/2017 0552   VLDL 20 06/19/2017 0552   LDLCALC 96 06/19/2017 0552   HgbA1c:  Lab Results  Component Value Date   HGBA1C 5.4 06/18/2017   Urine Drug Screen: No results found for: LABOPIA, COCAINSCRNUR, LABBENZ, AMPHETMU, THCU, LABBARB  Alcohol Level No results found for: [facility]  IMAGING  Dg Chest 2 View 06/18/2017 IMPRESSION:  1. New mass-like opacity projecting over the lower left hemithorax seen only on the frontal projection. This is of uncertain etiology and significance, and could be infectious/inflammatory or neoplastic, or could alternatively reflect a healing fracture of the anterior aspect of the left fourth rib. Further evaluation with chest CT (preferably with IV contrast) is recommended in the near future to better evaluate this finding.  2. Aortic atherosclerosis.    Ct Head Wo Contrast 06/18/2017 IMPRESSION:  Chronic changes as described. Progressive atrophy since 2018. No acute intracranial findings.    Ct Chest W Contrast 06/18/2017  IMPRESSION:  1. The finding on the recent chest radiograph corresponds to a 3.0 x 2.2 x 2.0 cm left lower lobe mass which is highly aggressive in appearance, very likely to represent a primary bronchogenic neoplasm. This is intimately associated with the left major fissure, but there is no associated mediastinal or hilar lymphadenopathy noted at this time. Further evaluation with PET-CT is recommended in the near future for additional diagnostic and staging purposes.  2. Multiple tiny 2-3 mm pulmonary nodules scattered throughout the lungs bilaterally, nonspecific and statistically favored to represent tiny areas of mucoid impaction within terminal bronchioles. Attention on any future follow-up imaging is recommended to ensure stability or regression.  3. Diffuse bronchial wall thickening  with  mild centrilobular and paraseptal emphysema; imaging findings suggestive of underlying COPD.  4. Aortic atherosclerosis, in addition to left main and 3 vessel coronary artery disease.  5. Multiple splenic infarcts.  6. 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 Aortic Atherosclerosis (ICD10-I70.0) and Emphysema (ICD10-J43.9).     Mr Jeri Cos And Wo Contrast 06/18/2017 IMPRESSION:  1. Numerous small acute infarcts scattered in the bilateral anterior and posterior circulation, suggesting a recent embolic event from the heart or proximal aorta.  2. No associated hemorrhage or mass effect.  3. Underlying chronic small vessel disease.  4. No abnormal intracranial enhancement or metastatic disease identified.   Mr Jodene Nam Head Wo Contrast 06/19/2017 IMPRESSION:  1. No emergent large vessel occlusion or high-grade stenosis. Mild narrowing of the right middle cerebral artery M1 segment.  2. 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     Transthoracic Echocardiogram  05/09/2017 Study Conclusions - Left ventricle: The cavity size was normal. Wall thickness was   normal. Systolic function was normal. The estimated ejection   fraction was in the range of 60% to 65%. Wall motion was normal;   there were no regional wall motion abnormalities. Doppler   parameters are consistent with abnormal left ventricular   relaxation (grade 1 diastolic dysfunction).    Bilateral Carotid Dopplers - Bilateral 1-39% ICA stenosis   TEE  no cardiac sourceof embolism, vegetation for PFO   PHYSICAL EXAM Vitals:   06/21/17 0830 06/21/17 0835 06/21/17 0840 06/21/17 1253  BP: (!) 117/49 (!) 119/41  (!) 118/43  Pulse: 78 79  83  Resp: 20 (!) 21  16  Temp:    98.7 F (37.1 C)  TempSrc:    Oral  SpO2: 93% 94% 94% 97%  Weight:      Height:       Frail pleasant elderly Caucasian lady who is cachectic.  Not in distress. .  Afebrile. Head is nontraumatic. Neck is supple without bruit.    Cardiac exam no murmur or gallop. Lungs are clear to auscultation. Distal pulses are well felt. Neurological Exam ;  Awake  Alert oriented x 3. Normal speech and language.eye movements full without nystagmus.fundi were not visualized. Vision acuity and fields appear normal. Hearing is normal. Palatal movements are normal. Face symmetric. Tongue midline. Normal symmetric 4/5 strength, tone, reflexes and coordination. Normal sensation. Gait deferred.    ASSESSMENT/PLAN [name] [name] is a 83 y.o. female with history of pneumonia, hyperlipidemia, recent UTI, cognitive decline times 1 year, hypertension, coronary artery disease, and anxiety presenting with left-sided weakness. She did not receive IV t-PA due to late presentation.  Stroke: Multiple small acute bilateral scattered infarcts likely embolic etiology unknown.  Resultant generalized weakness  CT head - Progressive atrophy since 2018. No acute intracranial findings.   MRI head - Numerous small acute infarcts scattered in the bilateral anterior and posterior circulation, suggesting a recent embolic event from the heart or proximal aorta.   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  Carotid Doppler - bilateral 1-39% ICA stenosis  2D Echo - 05/09/2017 -unremarkable  LDL - 96  HgbA1c - 5.4  VTE prophylaxis -Lovenox Fall precautions Diet Heart Room service appropriate? Yes; Fluid consistency: Thin Diet - low sodium heart healthy  aspirin 81 mg daily prior to admission, now on aspirin 81 mg daily  Patient counseled to be compliant with her antithrombotic  medications  Ongoing aggressive stroke risk factor management  Therapy recommendations:  CIR recommended  Disposition:  Pending  Hypertension  Stable  Permissive hypertension (OK if < 220/120) but gradually normalize in 5-7 days  Long-term BP goal normotensive  Hyperlipidemia  Home  meds: No lipid-lowering medications prior to admission  LDL 96, goal < 70  Now on Lipitor 20 mg daily  Continue statin at discharge   Other Stroke Risk Factors  Advanced age  Former cigarette smoker - quit 6 years ago  Coronary artery disease   Other Active Problems  Abnormal chest x-ray consistent with primary bronchogenic neoplasm. Workup in progress.  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   Plan / Recommendations   No need for furtherstrokeworkup primary team to decide further workupfor lung cancer  No need to consider loop recorder at the present time as she may not be a long-term candidate for anticoagulation if diagnosis of lung cancer is confirmed  Continue aspirin for now.  Will not add Plavix as she is likely to need lung biopsy.      Hospital day # 3  I have personally examined this patient, reviewed notes, independently viewed imaging studies, participated in medical decision making and plan of care.ROS completed by me personally and pertinent positives fully documented  I have made any additions or clarifications directly to the above note.  She has presented with by cerebral embolic infarcts etiology indeterminate  Recommend  no need for loop recorder at the present time unless diagnosis of cancer has been ruled out.  Long discussion with the patient and Dr.Kshitz and answered questions.  Stroke team will sign off. Follow-up as an outpatient in stroke clinic in 6 weeks. Kindly call for questions if any. Antony Contras, MD Medical Director [facility] Stroke Center Pager: [phone] 06/21/2017 1:57 PM   To contact Stroke Continuity provider, please refer to [email or web address] After hours, contact General Neurology

## 2017-06-21 NOTE — Progress Notes (Signed)
Physical Therapy Treatment Patient Details Name: [name] [name] MRN: [id number] DOB: 01/05/1935 Today's Date: 06/21/2017    History of Present Illness 82 y.o.femalewith medical history significant forhypertension and CAD with remote stent, admitted for evaluation of weakness. Patient had reportedly been generally weak for the past month, but was noted to have acute worsening on the left side over the 1-2 days PTA. Patient fell in the bathroom without hitting her head or suffering any significant injury but was noted to have more left leg weakness than right when her husband was helping her up. The next day they came to ED where MRI brain revealed numerous small acute infarcts scattered throughout the bilateral anterior and posterior circulation. s/p TEE 3/19.    PT Comments    Patient progressing well towards PT goals. Tolerated transfers and gait training with Min-Mod A for balance/safety. Less assist required for standing today. Pt with baseline cognitive deficits- requiring constant cues for sequencing and memory within session. Better able to functionally use LUE today. Improved ambulation distance with chair follow as pt fatigues. Discharge recommendation updated to SNF per family choice. Will follow.   Follow Up Recommendations  SNF;Supervision for mobility/OOB     Equipment Recommendations  None recommended by PT    Recommendations for Other Services       Precautions / Restrictions Precautions Precautions: Fall Restrictions Weight Bearing Restrictions: No    Mobility  Bed Mobility Overal bed mobility: Needs Assistance Bed Mobility: Rolling;Sidelying to Sit Rolling: Min guard Sidelying to sit: Min guard;HOB elevated       General bed mobility comments: Cues to roll and reach with LUE, able to bring LEs to EOB. Able to elevate trunk without support today. leans right with performing tasks EOB.  Transfers Overall transfer level: Needs assistance Equipment used:  Rolling walker (2 wheeled);None Transfers: Sit to/from [employer] to Stand: Min assist Stand pivot transfers: Min assist       General transfer comment: Assist to power to standing from EOB with cues for hand placement x2, from [facility] x2, SPT bed to/from [facility] with Min A with BUE support. Transferred to chair post ambulation.  Ambulation/Gait Ambulation/Gait assistance: Min assist Ambulation Distance (Feet): 20 Feet(+ 20') Assistive device: Rolling walker (2 wheeled) Gait Pattern/deviations: Step-to pattern;Step-through pattern Gait velocity: decreased   General Gait Details: Cues to increase step length bilaterally and for RW proximity. Able to keep LUE on walker today, 1 seated rest break. Not leaning noted today. Needs step by step cues for sequencing.    Stairs            Wheelchair Mobility    Modified Rankin (Stroke Patients Only) Modified Rankin (Stroke Patients Only) Pre-Morbid Rankin Score: Moderately severe disability Modified Rankin: Moderately severe disability     Balance Overall balance assessment: Needs assistance Sitting-balance support: Feet supported;No upper extremity supported Sitting balance-Leahy Scale: Fair Sitting balance - Comments: Able to sit EOB unsupported statically but when wiping nose, pt with LOB towards right but able to recover without assist.  Postural control: Right lateral lean Standing balance support: During functional activity Standing balance-Leahy Scale: Poor Standing balance comment: Relient on BUEs for support in standing and Mod A for pericare.                            Cognition Arousal/Alertness: Awake/alert Behavior During Therapy: WFL for tasks assessed/performed Overall Cognitive Status: History of cognitive impairments - at baseline Area of Impairment: Following  commands;Problem solving;Memory                     Memory: Decreased short-term memory Following Commands: Follows  one step commands with increased time     Problem Solving: Slow processing;Requires verbal cues General Comments: Better able to use LUE today. Poor memory within session. Requires repetition for tasks.       Exercises      General Comments General comments (skin integrity, edema, etc.): Spouse present for session and provided chair follow. HRs stayed in 90s-100s bpm.      Pertinent Vitals/Pain Pain Assessment: Faces Faces Pain Scale: No hurt    Home Living                      Prior Function            PT Goals (current goals can now be found in the care plan section) Progress towards PT goals: Progressing toward goals    Frequency    Min 3X/week      PT Plan Discharge plan needs to be updated    Co-evaluation              AM-PAC PT "6 Clicks" Daily Activity  Outcome Measure  Difficulty turning over in bed (including adjusting bedclothes, sheets and blankets)?: None Difficulty moving from lying on back to sitting on the side of the bed? : None Difficulty sitting down on and standing up from a chair with arms (e.g., wheelchair, bedside commode, etc,.)?: Unable Help needed moving to and from a bed to chair (including a wheelchair)?: A Lot Help needed walking in hospital room?: A Lot Help needed climbing 3-5 steps with a railing? : A Lot 6 Click Score: 15    End of Session Equipment Utilized During Treatment: Gait belt Activity Tolerance: Patient tolerated treatment well Patient left: in chair;with call bell/phone within reach;with family/visitor present Nurse Communication: Mobility status PT Visit Diagnosis: Hemiplegia and hemiparesis Hemiplegia - Right/Left: Left Hemiplegia - dominant/non-dominant: Non-dominant Hemiplegia - caused by: Cerebral infarction     Time: 1010-1037 PT Time Calculation (min) (ACUTE ONLY): 27 min  Charges:  $Gait Training: 8-22 mins $Therapeutic Activity: 8-22 mins                    G Codes:       Wray Kearns, PT, DPT [place] 06/21/2017, 11:31 AM

## 2017-06-21 NOTE — NC FL2 (Signed)
[place] LEVEL OF CARE SCREENING TOOL     IDENTIFICATION  Patient Name: [name] [name] Birthdate: 05/02/1934 Sex: female Admission Date (Current Location): 06/18/2017  [facility] and [place] Number:  Herbalist and Address:  [facility] [place], [place] [address] [place], [place] [address]      Provider Number: [id number]  Attending Physician Name and Address:  Aline August, MD  Relative Name and Phone Number:       Current Level of Care: Hospital Recommended Level of Care: [place] Prior Approval Number:    Date Approved/Denied:   PASRR Number: [id number]  Discharge Plan: SNF    Current Diagnoses: Patient Active Problem List   Diagnosis Date Noted  . NSVT (nonsustained ventricular tachycardia) ([place])   . Acute embolic stroke ([place]) 13/24/4010  . Hypokalemia 06/18/2017  . Ischemic stroke ([place]) 06/18/2017  . Dehydration 05/08/2017  . Nausea & vomiting 05/08/2017  . Leukocytosis   . CAP (community acquired pneumonia) 08/23/2014  . Leg pain 09/15/2011  . Carotid bruit 09/15/2011  . Mass of lower lobe of left lung 08/16/2011  . HYPERCHOLESTEROLEMIA 01/07/2010  . ANXIETY 01/07/2010  . Essential hypertension 01/07/2010  . CAD 01/07/2010    Orientation RESPIRATION BLADDER Height & Weight     Self, Time, Place, Situation  Normal Continent Weight: 104 lb (47.2 kg) Height:  5\' 2"  (157.5 cm)  BEHAVIORAL SYMPTOMS/MOOD NEUROLOGICAL BOWEL NUTRITION STATUS      Continent Diet(heart healthy)  AMBULATORY STATUS COMMUNICATION OF NEEDS Skin   Limited Assist Verbally Normal                       Personal Care Assistance Level of Assistance  Bathing, Feeding, Dressing Bathing Assistance: Limited assistance Feeding assistance: Independent Dressing Assistance: Limited assistance     Functional Limitations Info  Sight, Hearing, Speech Sight Info: Adequate Hearing Info: Adequate Speech Info: Adequate    SPECIAL  CARE FACTORS FREQUENCY  PT (By licensed PT), OT (By licensed OT)     PT Frequency: 5x/wk OT Frequency: 5x/wk            Contractures Contractures Info: Not present    Additional Factors Info  Code Status, Allergies Code Status Info: Full Allergies Info: Other: had a food allergy once but unknown source of allergic reaction           Current Medications (06/21/2017):  This is the current hospital active medication list Current Facility-Administered Medications  Medication Dose Route Frequency Provider Last Rate Last Dose  . acetaminophen (TYLENOL) tablet 650 mg  650 mg Oral Q4H PRN Opyd, Ilene Qua, MD       Or  . acetaminophen (TYLENOL) solution 650 mg  650 mg Per Tube Q4H PRN Opyd, Ilene Qua, MD       Or  . acetaminophen (TYLENOL) suppository 650 mg  650 mg Rectal Q4H PRN Opyd, Ilene Qua, MD      . aspirin EC tablet 81 mg  81 mg Oral Daily Candise Che A, NP   81 mg at 06/18/17 2021  . aspirin EC tablet 81 mg  81 mg Oral Daily Opyd, Ilene Qua, MD   81 mg at 06/21/17 0949  . atorvastatin (LIPITOR) tablet 20 mg  20 mg Oral q1800 Costello, Mary A, NP   20 mg at 06/20/17 1750  . enoxaparin (LOVENOX) injection 30 mg  30 mg Subcutaneous Q24H Alekh, Kshitiz, MD      . feeding supplement (  BOOST / RESOURCE BREEZE) liquid 1 Container  1 Container Oral BID BM Aline August, MD   1 Container at 06/21/17 [phone]  . multivitamin with minerals tablet 1 tablet  1 tablet Oral Daily Opyd, Ilene Qua, MD   1 tablet at 06/21/17 0950  . senna-docusate (Senokot-S) tablet 1 tablet  1 tablet Oral QHS PRN Opyd, Ilene Qua, MD         Discharge Medications: Please see discharge summary for a list of discharge medications.  Relevant Imaging Results:  Relevant Lab Results:   Additional Information SS#: [id number]  [name], LCSW

## 2017-06-21 NOTE — Clinical Social Work Note (Signed)
Clinical Social Work Assessment  Patient Details  Name: [name] [name] MRN: [id number] Date of Birth: 30-Dec-1934  Date of referral:  06/21/17               Reason for consult:  Facility Placement                Permission sought to share information with:  Facility Sport and exercise psychologist, Family Supports Permission granted to share information::  Yes, Verbal Permission Granted  Name::     Psychologist, educational::  SNF  Relationship::  Spouse  Contact Information:     Housing/Transportation Living arrangements for the past 2 months:  [place] of Information:  Patient, Spouse Patient Interpreter Needed:  None Criminal Activity/Legal Involvement Pertinent to Current Situation/Hospitalization:  No - Comment as needed Significant Relationships:  Spouse, Adult Children Lives with:  Self, Spouse Do you feel safe going back to the place where you live?  Yes Need for family participation in patient care:  No (Coment)  Care giving concerns:  Patient lives at home with spouse, but will benefit from short term rehab at discharge prior to returning home with spouse support.   Social Worker assessment / plan:  CSW alerted by Rehab Admissions that patient would prefer [facility] in [place] for SNF. CSW sent referral and confirmed bed offer. CSW updated patient and family at bedside. CSW answered questions and discussed transportation with patient and family. CSW to follow to facilitate discharge to [facility].  Employment status:  Retired Nurse, adult PT Recommendations:  [place] / Referral to community resources:  [place]  Patient/Family's Response to care:  Patient agreeable to SNF placement.  Patient/Family's Understanding of and Emotional Response to Diagnosis, Current Treatment, and Prognosis:  Patient and family are appreciative of CSW assistance as it's much more convenient for the patient's husband to have  her at [place] for rehab. Patient is excited to get started and hopeful to get home soon.  Emotional Assessment Appearance:  Appears stated age Attitude/Demeanor/Rapport:  Engaged Affect (typically observed):  Pleasant Orientation:  Oriented to Self, Oriented to Place, Oriented to  Time, Oriented to Situation Alcohol / Substance use:  Not Applicable Psych involvement (Current and /or in the community):  No (Comment)  Discharge Needs  Concerns to be addressed:  Care Coordination Readmission within the last 30 days:  No Current discharge risk:  Dependent with Mobility Barriers to Discharge:  No Barriers Identified   [name], LCSW 06/21/2017, 1:29 PM

## 2017-06-22 ENCOUNTER — Encounter (HOSPITAL_COMMUNITY): Payer: Self-pay | Admitting: Cardiovascular Disease

## 2017-07-13 ENCOUNTER — Ambulatory Visit: Payer: Medicare Other | Admitting: Pulmonary Disease

## 2017-07-13 ENCOUNTER — Encounter: Payer: Self-pay | Admitting: Pulmonary Disease

## 2017-07-13 VITALS — BP 124/68 | HR 95 | Ht 62.0 in | Wt 104.0 lb

## 2017-07-13 DIAGNOSIS — R918 Other nonspecific abnormal finding of lung field: Secondary | ICD-10-CM | POA: Diagnosis not present

## 2017-07-13 DIAGNOSIS — R911 Solitary pulmonary nodule: Secondary | ICD-10-CM | POA: Diagnosis not present

## 2017-07-13 NOTE — H&P (View-Only) (Signed)
[name] [name]    [id number]    01-18-1935  Primary Care Physician:Elkins, Curt Jews, MD  Referring Physician: Leonard Downing, MD [address] [place], [place] [address]  Chief complaint: Lung mass  HPI: 83 year old with history of coronary artery disease, hypertension, hyperlipidemia, stroke admitted with weakness and CT findings of small CVA.  She had an incidental finding of left lower lobe mass and has been referred to pulmonary for further evaluation  She is doing well post discharge.  Denies any cough, sputum production, hemoptysis, fever.  Pets: None Occupation: Retired Web designer Exposures: No known exposures, no mold, hot tub Smoking history: 40-pack-year smoking history.  Quit in 2008 Travel History: Not significant  Outpatient Encounter Medications as of 07/13/2017  Medication Sig  . aspirin 81 MG tablet Take 1 tablet (81 mg total) by mouth daily.  Marland Kitchen atorvastatin (LIPITOR) 20 MG tablet Take 1 tablet (20 mg total) by mouth daily at 6 PM.  . EPINEPHrine (EPIPEN 2-PAK) 0.3 mg/0.3 mL IJ SOAJ injection Inject 0.3 mg into the muscle once as needed (for anaphylaxis).  . felodipine (PLENDIL) 10 MG 24 hr tablet Take 10 mg by mouth daily.    . Multiple Vitamin (MULTIVITAMIN WITH MINERALS) TABS tablet Take 1 tablet by mouth daily.  . nitroGLYCERIN (NITROSTAT) 0.4 MG SL tablet Place 1 tablet (0.4 mg total) under the tongue every 5 (five) minutes as needed for chest pain.  Marland Kitchen sulfamethoxazole-trimethoprim (BACTRIM DS,SEPTRA DS) 800-160 MG tablet    No facility-administered encounter medications on file as of 07/13/2017.     Allergies as of 07/13/2017 - Review Complete 06/21/2017  Allergen Reaction Noted  . Other Anaphylaxis 06/18/2017    Past Medical History:  Diagnosis Date  . Anxiety    pt denies  . Arthritis    back, legs  . CAD (coronary artery disease) 2008   s/p Taxus Element Perseus Study stent (DES) to OM1 in 08/2006; EF 65%    . HTN (hypertension)   . Hypercholesterolemia   . Pneumonia 5/16   hospitalized for 4 days    Past Surgical History:  Procedure Laterality Date  . APPENDECTOMY  1950  . CORONARY ANGIOPLASTY WITH STENT PLACEMENT    . GANGLION CYST EXCISION Right 12/26/2014   Procedure: EXCISON OF RIGHT FOOT GANGLION CYST;  Surgeon: Wylene Simmer, MD;  Location: [facility];  Service: Orthopedics;  Laterality: Right;  . lower back surgery  1976  . NECK SURGERY  1990  . PARTIAL HYSTERECTOMY  1977  . PCI of hte lesion in th marginal branch circumflex artery    . TEE WITHOUT CARDIOVERSION N/A 06/21/2017   Procedure: TRANSESOPHAGEAL ECHOCARDIOGRAM (TEE);  Surgeon: Acie Fredrickson Wonda Cheng, MD;  [facility] ENDOSCOPY;  Service: Cardiovascular;  Laterality: N/A;    Family History  Problem Relation Age of Onset  . Heart failure Mother   . Hypertension Mother     Social History   Socioeconomic History  . Marital status: Married    Spouse name: Not on file  . Number of children: Not on file  . Years of education: Not on file  . Highest education level: Not on file  Occupational History  . Not on file  Social Needs  . Financial resource strain: Not on file  . Food insecurity:    Worry: Not on file    Inability: Not on file  . Transportation needs:    Medical: Not on file    Non-medical:  Not on file  Tobacco Use  . Smoking status: Former Smoker    Packs/day: 1.00    Years: 40.00    Pack years: 40.00    Types: Cigarettes    Last attempt to quit: 2008    Years since quitting: 11.2  . Smokeless tobacco: Never Used  Substance and Sexual Activity  . Alcohol use: No  . Drug use: No  . Sexual activity: Not on file  Lifestyle  . Physical activity:    Days per week: Not on file    Minutes per session: Not on file  . Stress: Not on file  Relationships  . Social connections:    Talks on phone: Not on file    Gets together: Not on file    Attends religious service: Not on file    Active  member of club or organization: Not on file    Attends meetings of clubs or organizations: Not on file    Relationship status: Not on file  . Intimate partner violence:    Fear of current or ex partner: Not on file    Emotionally abused: Not on file    Physically abused: Not on file    Forced sexual activity: Not on file  Other Topics Concern  . Not on file  Social History Narrative  . Not on file    Review of systems: Review of Systems  Constitutional: Negative for fever and chills.  HENT: Negative.   Eyes: Negative for blurred vision.  Respiratory: as per HPI  Cardiovascular: Negative for chest pain and palpitations.  Gastrointestinal: Negative for vomiting, diarrhea, blood per rectum. Genitourinary: Negative for dysuria, urgency, frequency and hematuria.  Musculoskeletal: Negative for myalgias, back pain and joint pain.  Skin: Negative for itching and rash.  Neurological: Negative for dizziness, tremors, focal weakness, seizures and loss of consciousness.  Endo/Heme/Allergies: Negative for environmental allergies.  Psychiatric/Behavioral: Negative for depression, suicidal ideas and hallucinations.  All other systems reviewed and are negative.  Physical Exam: Blood pressure 124/68, pulse 95, height 5\' 2"  (1.575 m), weight 104 lb (47.2 kg), SpO2 93 %. Gen:      No acute distress HEENT:  EOMI, sclera anicteric Neck:     No masses; no thyromegaly Lungs:    Clear to auscultation bilaterally; normal respiratory effort CV:         Regular rate and rhythm; no murmurs Abd:      + bowel sounds; soft, non-tender; no palpable masses, no distension Ext:    No edema; adequate peripheral perfusion Skin:      Warm and dry; no rash Neuro: alert and oriented x 3 Psych: normal mood and affect  Data Reviewed: CT chest 06/18/17- left lower lobe mass measuring 3 cm.  Multiple bilateral 2-3 mm lung nodules.  No mediastinal or hilar lymphadenopathy. Atherosclerosis, left main and three-vessel  coronary artery disease. CT abdomen 06/19/17- no evidence of metastatic disease. I have reviewed the images personally  Assessment:  Left lower lobe mass Suspicious for primary lung malignancy.  No evidence of metastasis or lymphadenopathy on CT scan Schedule for PET scan. She will likely need navigational biopsy of the lung mass under general anesthesia Risk/benefit discussed with the patient and she has agreed to the plan We will get cardiology clearance from Dr. Johnsie Cancel.  Plan/Recommendations: - PET scan - Cardiology clearence  Marshell Garfinkel MD [facility] 07/13/2017, 10:47 AM  CC: Leonard Downing, *

## 2017-07-13 NOTE — Patient Instructions (Signed)
We will schedule you for a PET scan for better evaluation of the lung nodule Based on the findings you may need to undergo procedure called bronchoscope to make a diagnosis I will get in touch with you after the PET scan to discuss results and plan for next steps Will need to get in touch with Dr. Johnsie Cancel your cardiologist to get clearance for the procedure. Follow-up in 1 month

## 2017-07-13 NOTE — Progress Notes (Signed)
[name] [name]    [id number]    02/28/1935  Primary Care Physician:Elkins, Curt Jews, MD  Referring Physician: Leonard Downing, MD [address] [place], [place] [address]  Chief complaint: Lung mass  HPI: 82 year old with history of coronary artery disease, hypertension, hyperlipidemia, stroke admitted with weakness and CT findings of small CVA.  She had an incidental finding of left lower lobe mass and has been referred to pulmonary for further evaluation  She is doing well post discharge.  Denies any cough, sputum production, hemoptysis, fever.  Pets: None Occupation: Retired Web designer Exposures: No known exposures, no mold, hot tub Smoking history: 40-pack-year smoking history.  Quit in 2008 Travel History: Not significant  Outpatient Encounter Medications as of 07/13/2017  Medication Sig  . aspirin 81 MG tablet Take 1 tablet (81 mg total) by mouth daily.  Marland Kitchen atorvastatin (LIPITOR) 20 MG tablet Take 1 tablet (20 mg total) by mouth daily at 6 PM.  . EPINEPHrine (EPIPEN 2-PAK) 0.3 mg/0.3 mL IJ SOAJ injection Inject 0.3 mg into the muscle once as needed (for anaphylaxis).  . felodipine (PLENDIL) 10 MG 24 hr tablet Take 10 mg by mouth daily.    . Multiple Vitamin (MULTIVITAMIN WITH MINERALS) TABS tablet Take 1 tablet by mouth daily.  . nitroGLYCERIN (NITROSTAT) 0.4 MG SL tablet Place 1 tablet (0.4 mg total) under the tongue every 5 (five) minutes as needed for chest pain.  Marland Kitchen sulfamethoxazole-trimethoprim (BACTRIM DS,SEPTRA DS) 800-160 MG tablet    No facility-administered encounter medications on file as of 07/13/2017.     Allergies as of 07/13/2017 - Review Complete 06/21/2017  Allergen Reaction Noted  . Other Anaphylaxis 06/18/2017    Past Medical History:  Diagnosis Date  . Anxiety    pt denies  . Arthritis    back, legs  . CAD (coronary artery disease) 2008   s/p Taxus Element Perseus Study stent (DES) to OM1 in 08/2006; EF 65%    . HTN (hypertension)   . Hypercholesterolemia   . Pneumonia 5/16   hospitalized for 4 days    Past Surgical History:  Procedure Laterality Date  . APPENDECTOMY  1950  . CORONARY ANGIOPLASTY WITH STENT PLACEMENT    . GANGLION CYST EXCISION Right 12/26/2014   Procedure: EXCISON OF RIGHT FOOT GANGLION CYST;  Surgeon: Wylene Simmer, MD;  Location: [facility];  Service: Orthopedics;  Laterality: Right;  . lower back surgery  1976  . NECK SURGERY  1990  . PARTIAL HYSTERECTOMY  1977  . PCI of hte lesion in th marginal branch circumflex artery    . TEE WITHOUT CARDIOVERSION N/A 06/21/2017   Procedure: TRANSESOPHAGEAL ECHOCARDIOGRAM (TEE);  Surgeon: Acie Fredrickson Wonda Cheng, MD;  Location: [facility] ENDOSCOPY;  Service: Cardiovascular;  Laterality: N/A;    Family History  Problem Relation Age of Onset  . Heart failure Mother   . Hypertension Mother     Social History   Socioeconomic History  . Marital status: Married    Spouse name: Not on file  . Number of children: Not on file  . Years of education: Not on file  . Highest education level: Not on file  Occupational History  . Not on file  Social Needs  . Financial resource strain: Not on file  . Food insecurity:    Worry: Not on file    Inability: Not on file  . Transportation needs:    Medical: Not on file    Non-medical:  Not on file  Tobacco Use  . Smoking status: Former Smoker    Packs/day: 1.00    Years: 40.00    Pack years: 40.00    Types: Cigarettes    Last attempt to quit: 2008    Years since quitting: 11.2  . Smokeless tobacco: Never Used  Substance and Sexual Activity  . Alcohol use: No  . Drug use: No  . Sexual activity: Not on file  Lifestyle  . Physical activity:    Days per week: Not on file    Minutes per session: Not on file  . Stress: Not on file  Relationships  . Social connections:    Talks on phone: Not on file    Gets together: Not on file    Attends religious service: Not on file    Active  member of club or organization: Not on file    Attends meetings of clubs or organizations: Not on file    Relationship status: Not on file  . Intimate partner violence:    Fear of current or ex partner: Not on file    Emotionally abused: Not on file    Physically abused: Not on file    Forced sexual activity: Not on file  Other Topics Concern  . Not on file  Social History Narrative  . Not on file    Review of systems: Review of Systems  Constitutional: Negative for fever and chills.  HENT: Negative.   Eyes: Negative for blurred vision.  Respiratory: as per HPI  Cardiovascular: Negative for chest pain and palpitations.  Gastrointestinal: Negative for vomiting, diarrhea, blood per rectum. Genitourinary: Negative for dysuria, urgency, frequency and hematuria.  Musculoskeletal: Negative for myalgias, back pain and joint pain.  Skin: Negative for itching and rash.  Neurological: Negative for dizziness, tremors, focal weakness, seizures and loss of consciousness.  Endo/Heme/Allergies: Negative for environmental allergies.  Psychiatric/Behavioral: Negative for depression, suicidal ideas and hallucinations.  All other systems reviewed and are negative.  Physical Exam: Blood pressure 124/68, pulse 95, height 5\' 2"  (1.575 m), weight 104 lb (47.2 kg), SpO2 93 %. Gen:      No acute distress HEENT:  EOMI, sclera anicteric Neck:     No masses; no thyromegaly Lungs:    Clear to auscultation bilaterally; normal respiratory effort CV:         Regular rate and rhythm; no murmurs Abd:      + bowel sounds; soft, non-tender; no palpable masses, no distension Ext:    No edema; adequate peripheral perfusion Skin:      Warm and dry; no rash Neuro: alert and oriented x 3 Psych: normal mood and affect  Data Reviewed: CT chest 06/18/17- left lower lobe mass measuring 3 cm.  Multiple bilateral 2-3 mm lung nodules.  No mediastinal or hilar lymphadenopathy. Atherosclerosis, left main and three-vessel  coronary artery disease. CT abdomen 06/19/17- no evidence of metastatic disease. I have reviewed the images personally  Assessment:  Left lower lobe mass Suspicious for primary lung malignancy.  No evidence of metastasis or lymphadenopathy on CT scan Schedule for PET scan. She will likely need navigational biopsy of the lung mass under general anesthesia Risk/benefit discussed with the patient and she has agreed to the plan We will get cardiology clearance from Dr. Johnsie Cancel.  Plan/Recommendations: - PET scan - Cardiology clearence  Marshell Garfinkel MD [facility] 07/13/2017, 10:47 AM  CC: Leonard Downing, *

## 2017-07-21 ENCOUNTER — Ambulatory Visit (HOSPITAL_COMMUNITY)
Admission: RE | Admit: 2017-07-21 | Discharge: 2017-07-21 | Disposition: A | Discharge status: Home / Self Care | Payer: Medicare Other | Source: Ambulatory Visit | Attending: Pulmonary Disease | Admitting: Pulmonary Disease

## 2017-07-21 DIAGNOSIS — J439 Emphysema, unspecified: Secondary | ICD-10-CM | POA: Diagnosis not present

## 2017-07-21 DIAGNOSIS — I2510 Atherosclerotic heart disease of native coronary artery without angina pectoris: Secondary | ICD-10-CM | POA: Diagnosis not present

## 2017-07-21 DIAGNOSIS — I700 Atherosclerosis of aorta: Secondary | ICD-10-CM | POA: Diagnosis not present

## 2017-07-21 DIAGNOSIS — K8020 Calculus of gallbladder without cholecystitis without obstruction: Secondary | ICD-10-CM | POA: Diagnosis not present

## 2017-07-21 DIAGNOSIS — R911 Solitary pulmonary nodule: Secondary | ICD-10-CM | POA: Insufficient documentation

## 2017-07-21 LAB — GLUCOSE, CAPILLARY: Glucose-Capillary: 88 mg/dL (ref 65–99)

## 2017-07-21 MED ORDER — FLUDEOXYGLUCOSE F - 18 (FDG) INJECTION
5.1900 | Freq: Once | INTRAVENOUS | Status: AC | PRN
  Administered 2017-07-21: 5.19 via INTRAVENOUS

## 2017-07-25 ENCOUNTER — Telehealth: Payer: Self-pay | Admitting: Cardiovascular Disease

## 2017-07-25 ENCOUNTER — Telehealth: Payer: Self-pay | Admitting: Pulmonary Disease

## 2017-07-25 NOTE — Progress Notes (Signed)
CARDIOLOGY OFFICE NOTE  Date:  07/26/2017    [name] [name] Date of Birth: 1935-02-21 Medical Record #[id number]  PCP:  Leonard Downing, MD  Cardiologist:  Johnsie Cancel  Chief Complaint  Patient presents with  . Pre-op Exam    Surgical clearance - seen for Dr. Johnsie Cancel    History of Present Illness: [name] [name] is a 83 y.o. female who presents today for a pre op clearance visit. Seen for Dr. Johnsie Cancel.   She has not been here since 2017.   She has a history of HTN, HLD and CAD with previous stent to an OM branch back in 2008. Former smoker. Last Myoview in 2016 was normal. Has carotid disease as well.   Had been admitted in February with elevated troponin in the setting of N, V and diarrhea and feeling poorly - noted "in the absence of anginal symptoms and normal echo - no further ischemic evaluation was planned".  Then got admitted again in March with "weakness". MRI of the brain with numerous small acute infarcts. CT of the chest with left lower lobe mass. TEE was done.   Seen by cardiology for brief run NSVT by Dr. Stanford Breed - looked to be asymptomatic and noted that there were NO telemetry evidence or cardiac strips as reported in the chart of NSVT. Dr. Stanford Breed recommended TEE and a loop but in light of the left lower mass concerning for malignancy - "it was felt to be worthwhile to get a definitive diagnosis of this mass and prognosis before proceeding with any additional cardiac evaluation".   Current differential now of the numerous infarcts is possible mets, endocarditis vs embolic event. Her TEE was ok.   Comes in today. Here with her husband. Here now to get clearance for her biopsy. Anxious to proceed. She says she is feeling ok. No chest pain. Denies being short of breath. Says she has no pain anywhere. Appetite ok. No palpitations. No real exercise program but trying to move around the house a bit more.   Past Medical History:  Diagnosis Date  . Anxiety    pt denies  .  Arthritis    back, legs  . CAD (coronary artery disease) 2008   s/p Taxus Element Perseus Study stent (DES) to OM1 in 08/2006; EF 65%  . HTN (hypertension)   . Hypercholesterolemia   . Pneumonia 5/16   hospitalized for 4 days    Past Surgical History:  Procedure Laterality Date  . APPENDECTOMY  1950  . CORONARY ANGIOPLASTY WITH STENT PLACEMENT    . GANGLION CYST EXCISION Right 12/26/2014   Procedure: EXCISON OF RIGHT FOOT GANGLION CYST;  Surgeon: Wylene Simmer, MD;  Location: [facility];  Service: Orthopedics;  Laterality: Right;  . lower back surgery  1976  . NECK SURGERY  1990  . PARTIAL HYSTERECTOMY  1977  . PCI of hte lesion in th marginal branch circumflex artery    . TEE WITHOUT CARDIOVERSION N/A 06/21/2017   Procedure: TRANSESOPHAGEAL ECHOCARDIOGRAM (TEE);  Surgeon: Thayer Headings, MD;  Location: [facility] ENDOSCOPY;  Service: Cardiovascular;  Laterality: N/A;     Medications: Current Meds  Medication Sig  . aspirin 81 MG tablet Take 1 tablet (81 mg total) by mouth daily.  Marland Kitchen atorvastatin (LIPITOR) 20 MG tablet Take 1 tablet (20 mg total) by mouth daily at 6 PM.  . EPINEPHrine (EPIPEN 2-PAK) 0.3 mg/0.3 mL IJ SOAJ injection Inject 0.3 mg into the muscle once as needed (for anaphylaxis).  Marland Kitchen  felodipine (PLENDIL) 10 MG 24 hr tablet Take 10 mg by mouth daily.    . Multiple Vitamin (MULTIVITAMIN WITH MINERALS) TABS tablet Take 1 tablet by mouth daily.  . nitroGLYCERIN (NITROSTAT) 0.4 MG SL tablet Place 1 tablet (0.4 mg total) under the tongue every 5 (five) minutes as needed for chest pain.     Allergies: Allergies  Allergen Reactions  . Other Anaphylaxis    Food or preservative allergy of unknown/undetermined origin (happened last in 2017 when the patient had gone out to eat and hadn't yet touched her steak??)    Social History: The patient  reports that she quit smoking about 11 years ago. Her smoking use included cigarettes. She has a 40.00 pack-year smoking  history. She has never used smokeless tobacco. She reports that she does not drink alcohol or use drugs.   Family History: The patient's family history includes Heart failure in her mother; Hypertension in her mother.   Review of Systems: Please see the history of present illness.   Otherwise, the review of systems is positive for none.   All other systems are reviewed and negative.   Physical Exam: VS:  BP 120/76 (BP Location: Left Arm, Patient Position: Sitting, Cuff Size: Normal)   Pulse 68   Ht 5\' 2"  (1.575 m)   Wt 105 lb 12.8 oz (48 kg)   BMI 19.35 kg/m  .  BMI Body mass index is 19.35 kg/m.  Wt Readings from Last 3 Encounters:  07/26/17 105 lb 12.8 oz (48 kg)  07/13/17 104 lb (47.2 kg)  06/21/17 104 lb (47.2 kg)    General: Pleasant.Elderly thin female who is alert and in no acute distress.   HEENT: Normal.  Neck: Supple, no JVD, carotid bruits, or masses noted.  Cardiac: Regular rate and rhythm. No murmurs, rubs, or gallops. No edema.  Respiratory:  Lungs are clear to auscultation bilaterally with normal work of breathing.  GI: Soft and nontender.  MS: No deformity or atrophy. Gait and ROM intact.  Skin: Warm and dry. Color is normal.  Neuro:  Strength and sensation are intact and no gross focal deficits noted.  Psych: Alert, appropriate and with normal affect.   LABORATORY DATA:  EKG:  EKG is ordered today. This demonstrates NSR.  Lab Results  Component Value Date   WBC 8.9 06/20/2017   HGB 11.4 (L) 06/20/2017   HCT 35.9 (L) 06/20/2017   PLT 394 06/20/2017   GLUCOSE 92 06/21/2017   CHOL 162 06/19/2017   TRIG 101 06/19/2017   HDL 46 06/19/2017   LDLDIRECT 157.3 01/06/2011   LDLCALC 96 06/19/2017   ALT 12 (L) 06/20/2017   AST 20 06/20/2017   NA 138 06/21/2017   K 4.2 06/21/2017   CL 105 06/21/2017   CREATININE 0.79 06/21/2017   BUN 5 (L) 06/21/2017   CO2 23 06/21/2017   TSH 1.441 06/20/2017   INR 1.04 06/21/2017   HGBA1C 5.4 06/18/2017     BNP  (last 3 results) No results for input(s): BNP in the last 8760 hours.  ProBNP (last 3 results) No results for input(s): PROBNP in the last 8760 hours.   Other Studies Reviewed Today:  TEE Study Conclusions 06/2017  - Left ventricle: Systolic function was vigorous. The estimated   ejection fraction was in the range of 65% to 70%. - Aortic valve: No evidence of vegetation. - Left atrium: No evidence of thrombus in the atrial cavity or   appendage. - Pulmonic valve: No evidence of  vegetation.    Carotid Doppler Final Interpretation 06/2017: Right Carotid: Velocities in the right ICA are consistent with a 1-39% stenosis.  Left Carotid: Velocities in the left ICA are consistent with a 1-39% stenosis.  Vertebrals: Bilateral vertebral arteries demonstrate antegrade flow. Subclavians: Normal flow hemodynamics were seen in bilateral subclavian       arteries.   Electronically signed by Antony Contras on 06/21/2017 at 1:19:36 PM.  Notes Recorded by Josue Hector, MD on 07/22/2014 at 9:58 AM Normal myovue study with no evidence of ischemia or infarction   Assessment/Plan: 1. Pre op clearance - would be at increased risk due to elevated troponin back in February - was negative in March - she has no symptoms. Probable malignancy. Already noted to need the lung biopsy to make definitive diagnosis of the mass prior to any further cardiac testing. She may proceed. Will be available as needed.   2. Multiple infarcts noted on brain MRI- ? Mets - TEE was negative. Would hold on ILR until further definitive diagnosis of the lung mass as previously outlined.   3. Left lung mass - concerning for malignancy - plan per pulmonary  4. CAD - no active chest pain.   5. Concern for NSVT - never documented in the record. No palpitations. Would hold on Loop until definitive diagnosis of the lung mass.    Current medicines are reviewed with the patient today.  The patient does not have  concerns regarding medicines other than what has been noted above.  The following changes have been made:  See above.  Labs/ tests ordered today include:    Orders Placed This Encounter  Procedures  . EKG 12-Lead     Disposition:   Further disposition pending. Will be available as needed.   Patient is agreeable to this plan and will call if any problems develop in the interim.   SignedTruitt Merle, NP  07/26/2017 11:30 AM  [facility] [address] [place], [place]  [address] Phone: [phone] Fax: [phone]

## 2017-07-25 NOTE — Telephone Encounter (Signed)
Noted  

## 2017-07-25 NOTE — Telephone Encounter (Signed)
   Primary Cardiologist:Peter Johnsie Cancel, MD  Chart reviewed as part of pre-operative protocol coverage. Admitted 06/2017 with CVA. There were concern about nonsustained ventricular tachycardia. Recommended outpatient follow up with Dr. Johnsie Cancel.   She will require a follow-up visit in order to better assess preoperative cardiovascular risk. Hopefully Dr. Johnsie Cancel in clinic with APP if possible.   Pre-op covering staff: - Please schedule appointment and call patient to inform them. - Please contact requesting surgeon's office via preferred method (i.e, phone, fax) to inform them of need for appointment prior to surgery.  [place], [place]  07/25/2017, 3:29 PM

## 2017-07-25 NOTE — Telephone Encounter (Signed)
Per Pre-Op Protocol: appt made for 07/26/17 with Truitt Merle, NP dx lung BX... Pt was very grateful for [place] squeezing her in so quickly. I will route to Truitt Merle, NP so that she may have surgery information for appt.

## 2017-07-25 NOTE — Telephone Encounter (Signed)
New message      [facility] Pre-operative Risk Assessment    Request for surgical clearance:  1. What type of surgery is being performed?  CT Biopsy- dx: lung Nodule  2. When is this surgery scheduled? TBD  What type of clearance is required (medical clearance vs. Pharmacy clearance to hold med vs. Both)? medical 3. Are there any medications that need to be held prior to surgery and how long?n/a  4. Practice name and name of physician performing surgery? Dr Merrilyn Puma  5. What is your office phone number [phone]   7.   What is your office fax number [phone] attn: Lesleigh Noe  8.   Anesthesia type (None, local, MAC, general) ? general   Laurier Nancy 07/25/2017, 3:05 PM  _________________________________________________________________   (provider comments below)

## 2017-07-26 ENCOUNTER — Encounter: Payer: Self-pay | Admitting: Nurse Practitioner

## 2017-07-26 ENCOUNTER — Ambulatory Visit: Payer: Medicare Other | Admitting: Nurse Practitioner

## 2017-07-26 VITALS — BP 120/76 | HR 68 | Ht 62.0 in | Wt 105.8 lb

## 2017-07-26 DIAGNOSIS — Z01818 Encounter for other preprocedural examination: Secondary | ICD-10-CM | POA: Diagnosis not present

## 2017-07-26 NOTE — Patient Instructions (Addendum)
We will be checking the following labs today - NONE   Medication Instructions:    Continue with your current medicines.     Testing/Procedures To Be Arranged:  N/A  Follow-Up:   Will see how your biopsy turns out and then decide about follow up back here.     Other Special Instructions:   Ok to proceed with your lung biopsy.     If you need a refill on your cardiac medications before your next appointment, please call your pharmacy.   Call the [place] office at [phone] if you have any questions, problems or concerns.

## 2017-07-27 ENCOUNTER — Ambulatory Visit: Payer: Medicare Other | Admitting: Physician Assistant

## 2017-07-27 ENCOUNTER — Other Ambulatory Visit: Payer: Self-pay | Admitting: Emergency Medicine

## 2017-07-27 DIAGNOSIS — R918 Other nonspecific abnormal finding of lung field: Secondary | ICD-10-CM

## 2017-07-28 ENCOUNTER — Ambulatory Visit (INDEPENDENT_AMBULATORY_CARE_PROVIDER_SITE_OTHER)
Admission: RE | Admit: 2017-07-28 | Discharge: 2017-07-28 | Disposition: A | Discharge status: Home / Self Care | Payer: Medicare Other | Source: Ambulatory Visit | Attending: Emergency Medicine | Admitting: Emergency Medicine

## 2017-07-28 DIAGNOSIS — R918 Other nonspecific abnormal finding of lung field: Secondary | ICD-10-CM | POA: Diagnosis not present

## 2017-08-01 ENCOUNTER — Other Ambulatory Visit: Payer: Self-pay

## 2017-08-01 ENCOUNTER — Encounter (HOSPITAL_COMMUNITY): Payer: Self-pay | Admitting: *Deleted

## 2017-08-01 NOTE — Progress Notes (Signed)
Spoke with pt and her husband, [name] [name] for pre-op call. Pt has hx of CAD in 2008 with stent placement. Has cardiac clearance from Truitt Merle, NP on 07/26/17. Pt denies any recent chest pain or sob. Pt states she is not diabetic.

## 2017-08-03 ENCOUNTER — Ambulatory Visit (HOSPITAL_COMMUNITY): Payer: Medicare Other

## 2017-08-03 ENCOUNTER — Inpatient Hospital Stay (HOSPITAL_COMMUNITY): Payer: Medicare Other

## 2017-08-03 ENCOUNTER — Ambulatory Visit (HOSPITAL_COMMUNITY): Payer: Medicare Other | Admitting: Certified Registered Nurse Anesthetist

## 2017-08-03 ENCOUNTER — Other Ambulatory Visit: Payer: Self-pay

## 2017-08-03 ENCOUNTER — Encounter (HOSPITAL_COMMUNITY)
Admission: AD | Disposition: A | Discharge status: Home / Self Care | Payer: Self-pay | Source: Ambulatory Visit | Attending: Emergency Medicine

## 2017-08-03 ENCOUNTER — Encounter (HOSPITAL_COMMUNITY): Payer: Self-pay | Admitting: *Deleted

## 2017-08-03 ENCOUNTER — Ambulatory Visit: Payer: Self-pay | Admitting: Adult Health

## 2017-08-03 ENCOUNTER — Inpatient Hospital Stay (HOSPITAL_COMMUNITY)
Admission: AD | Admit: 2017-08-03 | Discharge: 2017-08-04 | DRG: 168 | Disposition: A | Discharge status: Home / Self Care | Payer: Medicare Other | Source: Ambulatory Visit | Attending: Emergency Medicine | Admitting: Emergency Medicine

## 2017-08-03 DIAGNOSIS — Z87891 Personal history of nicotine dependence: Secondary | ICD-10-CM

## 2017-08-03 DIAGNOSIS — R918 Other nonspecific abnormal finding of lung field: Secondary | ICD-10-CM

## 2017-08-03 DIAGNOSIS — E785 Hyperlipidemia, unspecified: Secondary | ICD-10-CM | POA: Diagnosis not present

## 2017-08-03 DIAGNOSIS — J939 Pneumothorax, unspecified: Secondary | ICD-10-CM

## 2017-08-03 DIAGNOSIS — Z91018 Allergy to other foods: Secondary | ICD-10-CM

## 2017-08-03 DIAGNOSIS — M1990 Unspecified osteoarthritis, unspecified site: Secondary | ICD-10-CM | POA: Diagnosis present

## 2017-08-03 DIAGNOSIS — Z955 Presence of coronary angioplasty implant and graft: Secondary | ICD-10-CM

## 2017-08-03 DIAGNOSIS — J95811 Postprocedural pneumothorax: Secondary | ICD-10-CM | POA: Diagnosis present

## 2017-08-03 DIAGNOSIS — I10 Essential (primary) hypertension: Secondary | ICD-10-CM | POA: Diagnosis not present

## 2017-08-03 DIAGNOSIS — R079 Chest pain, unspecified: Secondary | ICD-10-CM

## 2017-08-03 DIAGNOSIS — Z9102 Food additives allergy status: Secondary | ICD-10-CM | POA: Diagnosis not present

## 2017-08-03 DIAGNOSIS — Z8673 Personal history of transient ischemic attack (TIA), and cerebral infarction without residual deficits: Secondary | ICD-10-CM | POA: Diagnosis not present

## 2017-08-03 DIAGNOSIS — Z7982 Long term (current) use of aspirin: Secondary | ICD-10-CM | POA: Diagnosis not present

## 2017-08-03 DIAGNOSIS — Z79899 Other long term (current) drug therapy: Secondary | ICD-10-CM | POA: Diagnosis not present

## 2017-08-03 DIAGNOSIS — I2510 Atherosclerotic heart disease of native coronary artery without angina pectoris: Secondary | ICD-10-CM | POA: Diagnosis not present

## 2017-08-03 DIAGNOSIS — Z419 Encounter for procedure for purposes other than remedying health state, unspecified: Secondary | ICD-10-CM

## 2017-08-03 DIAGNOSIS — Z9889 Other specified postprocedural states: Secondary | ICD-10-CM

## 2017-08-03 HISTORY — PX: FUDUCIAL PLACEMENT: SHX5083

## 2017-08-03 HISTORY — DX: Cerebral infarction, unspecified: I63.9

## 2017-08-03 HISTORY — DX: Other nonspecific abnormal finding of lung field: R91.8

## 2017-08-03 HISTORY — PX: VIDEO BRONCHOSCOPY WITH ENDOBRONCHIAL NAVIGATION: SHX6175

## 2017-08-03 HISTORY — PX: BRONCHOSCOPY: SUR163

## 2017-08-03 LAB — BASIC METABOLIC PANEL
ANION GAP: 9 (ref 5–15)
BUN: 8 mg/dL (ref 6–20)
CHLORIDE: 107 mmol/L (ref 101–111)
CO2: 26 mmol/L (ref 22–32)
Calcium: 9.3 mg/dL (ref 8.9–10.3)
Creatinine, Ser: 0.85 mg/dL (ref 0.44–1.00)
GFR calc Af Amer: >60 mL/min (ref >60)
GFR calc non Af Amer: >60 mL/min (ref >60)
GLUCOSE: 92 mg/dL (ref 65–99)
Potassium: 3.8 mmol/L (ref 3.5–5.1)
Sodium: 142 mmol/L (ref 135–145)

## 2017-08-03 LAB — CBC
HCT: 37.1 % (ref 36.0–46.0)
HEMOGLOBIN: 11.6 g/dL — AB (ref 12.0–15.0)
MCH: 27.3 pg (ref 26.0–34.0)
MCHC: 31.3 g/dL (ref 30.0–36.0)
MCV: 87.3 fL (ref 78.0–100.0)
Platelets: 344 10*3/uL (ref 150–400)
RBC: 4.25 MIL/uL (ref 3.87–5.11)
RDW: 16.5 % — ABNORMAL HIGH (ref 11.5–15.5)
WBC: 10.2 10*3/uL (ref 4.0–10.5)

## 2017-08-03 LAB — TROPONIN I

## 2017-08-03 SURGERY — VIDEO BRONCHOSCOPY WITH ENDOBRONCHIAL NAVIGATION
Anesthesia: General | Site: Chest

## 2017-08-03 MED ORDER — FENTANYL CITRATE (PF) 250 MCG/5ML IJ SOLN
INTRAMUSCULAR | Status: AC
  Filled 2017-08-03: qty 5

## 2017-08-03 MED ORDER — LIDOCAINE 2% (20 MG/ML) 5 ML SYRINGE
INTRAMUSCULAR | Status: AC
  Filled 2017-08-03: qty 5

## 2017-08-03 MED ORDER — MIDAZOLAM HCL 2 MG/2ML IJ SOLN: 0.5000 mg | Freq: Once | INTRAMUSCULAR | Status: DC | PRN

## 2017-08-03 MED ORDER — SUGAMMADEX SODIUM 200 MG/2ML IV SOLN
INTRAVENOUS | Status: DC | PRN
  Administered 2017-08-03: 200 mg via INTRAVENOUS

## 2017-08-03 MED ORDER — LACTATED RINGERS IV SOLN
INTRAVENOUS | Status: DC
Start: 2017-08-03 — End: 2017-08-04
  Administered 2017-08-03 (×2): via INTRAVENOUS

## 2017-08-03 MED ORDER — 0.9 % SODIUM CHLORIDE (POUR BTL) OPTIME
TOPICAL | Status: DC | PRN
  Administered 2017-08-03: 1000 mL

## 2017-08-03 MED ORDER — ADULT MULTIVITAMIN W/MINERALS CH
1.0000 | ORAL_TABLET | ORAL | Status: DC
  Administered 2017-08-04: 1 via ORAL
  Filled 2017-08-03: qty 1

## 2017-08-03 MED ORDER — ROCURONIUM BROMIDE 50 MG/5ML IV SOSY
PREFILLED_SYRINGE | INTRAVENOUS | Status: DC | PRN
  Administered 2017-08-03: 50 mg via INTRAVENOUS

## 2017-08-03 MED ORDER — MEPERIDINE HCL 50 MG/ML IJ SOLN: 6.2500 mg | INTRAMUSCULAR | Status: DC | PRN

## 2017-08-03 MED ORDER — FENTANYL CITRATE (PF) 100 MCG/2ML IJ SOLN: 25.0000 ug | INTRAMUSCULAR | Status: DC | PRN

## 2017-08-03 MED ORDER — ATORVASTATIN CALCIUM 20 MG PO TABS
20.0000 mg | ORAL_TABLET | Freq: Every day | ORAL | Status: DC
  Filled 2017-08-03: qty 1

## 2017-08-03 MED ORDER — DEXAMETHASONE SODIUM PHOSPHATE 10 MG/ML IJ SOLN
INTRAMUSCULAR | Status: DC | PRN
  Administered 2017-08-03: 5 mg via INTRAVENOUS

## 2017-08-03 MED ORDER — ONDANSETRON HCL 4 MG/2ML IJ SOLN
INTRAMUSCULAR | Status: DC | PRN
  Administered 2017-08-03: 4 mg via INTRAVENOUS

## 2017-08-03 MED ORDER — ASPIRIN EC 81 MG PO TBEC
81.0000 mg | DELAYED_RELEASE_TABLET | ORAL | Status: DC
  Administered 2017-08-03 – 2017-08-04 (×2): 81 mg via ORAL
  Filled 2017-08-03 (×2): qty 1

## 2017-08-03 MED ORDER — ACETAMINOPHEN 325 MG PO TABS
650.0000 mg | ORAL_TABLET | Freq: Three times a day (TID) | ORAL | Status: DC | PRN
  Administered 2017-08-03: 650 mg via ORAL
  Filled 2017-08-03: qty 2

## 2017-08-03 MED ORDER — PROMETHAZINE HCL 25 MG/ML IJ SOLN: 6.2500 mg | INTRAMUSCULAR | Status: DC | PRN

## 2017-08-03 MED ORDER — ASPIRIN 81 MG PO TABS: 81.0000 mg | ORAL_TABLET | ORAL | Status: DC

## 2017-08-03 MED ORDER — DEXAMETHASONE SODIUM PHOSPHATE 10 MG/ML IJ SOLN
INTRAMUSCULAR | Status: AC
  Filled 2017-08-03: qty 1

## 2017-08-03 MED ORDER — PROPOFOL 10 MG/ML IV BOLUS
INTRAVENOUS | Status: AC
  Filled 2017-08-03: qty 20

## 2017-08-03 MED ORDER — ROCURONIUM BROMIDE 10 MG/ML (PF) SYRINGE
PREFILLED_SYRINGE | INTRAVENOUS | Status: AC
  Filled 2017-08-03: qty 5

## 2017-08-03 MED ORDER — FELODIPINE ER 10 MG PO TB24
10.0000 mg | ORAL_TABLET | Freq: Every day | ORAL | Status: DC
  Administered 2017-08-03 – 2017-08-04 (×2): 10 mg via ORAL
  Filled 2017-08-03 (×2): qty 1

## 2017-08-03 MED ORDER — ONDANSETRON HCL 4 MG/2ML IJ SOLN
INTRAMUSCULAR | Status: AC
  Filled 2017-08-03: qty 2

## 2017-08-03 MED ORDER — PROPOFOL 10 MG/ML IV BOLUS
INTRAVENOUS | Status: DC | PRN
  Administered 2017-08-03: 70 mg via INTRAVENOUS

## 2017-08-03 MED ORDER — LIDOCAINE 2% (20 MG/ML) 5 ML SYRINGE
INTRAMUSCULAR | Status: DC | PRN
  Administered 2017-08-03: 25 mg via INTRAVENOUS

## 2017-08-03 MED ORDER — FENTANYL CITRATE (PF) 100 MCG/2ML IJ SOLN
INTRAMUSCULAR | Status: DC | PRN
  Administered 2017-08-03 (×2): 50 ug via INTRAVENOUS

## 2017-08-03 SURGICAL SUPPLY — 44 items
ADAPTER BRONCH F/PENTAX (ADAPTER) ×4 IMPLANT
BRUSH BIOPSY BRONCH 10 SDTNB (MISCELLANEOUS) ×3 IMPLANT
BRUSH BIOPSY BRONCH 10MM SDTNB (MISCELLANEOUS) ×1
BRUSH CYTOL CELLEBRITY 1.5X140 (MISCELLANEOUS) ×4 IMPLANT
BRUSH SUPERTRAX BIOPSY (INSTRUMENTS) IMPLANT
BRUSH SUPERTRAX NDL-TIP CYTO (INSTRUMENTS) ×4 IMPLANT
CANISTER SUCT 3000ML PPV (MISCELLANEOUS) ×4 IMPLANT
CHANNEL WORK EXTEND EDGE 180 (KITS) IMPLANT
CHANNEL WORK EXTEND EDGE 45 (KITS) IMPLANT
CHANNEL WORK EXTEND EDGE 90 (KITS) IMPLANT
CONT SPEC 4OZ CLIKSEAL STRL BL (MISCELLANEOUS) ×4 IMPLANT
COVER BACK TABLE 60X90IN (DRAPES) ×4 IMPLANT
FILTER STRAW FLUID ASPIR (MISCELLANEOUS) IMPLANT
FORCEPS BIOP SUPERTRX PREMAR (INSTRUMENTS) ×4 IMPLANT
GAUZE SPONGE 4X4 12PLY STRL (GAUZE/BANDAGES/DRESSINGS) ×4 IMPLANT
GLOVE BIO SURGEON STRL SZ7.5 (GLOVE) ×8 IMPLANT
GOWN STRL REUS W/ TWL LRG LVL3 (GOWN DISPOSABLE) ×4 IMPLANT
GOWN STRL REUS W/TWL LRG LVL3 (GOWN DISPOSABLE) ×4
KIT CLEAN ENDO COMPLIANCE (KITS) ×4 IMPLANT
KIT LOCATABLE GUIDE (CANNULA) IMPLANT
KIT MARKER FIDUCIAL DELIVERY (KITS) ×4 IMPLANT
KIT PROCEDURE EDGE 180 (KITS) ×4 IMPLANT
KIT PROCEDURE EDGE 45 (KITS) IMPLANT
KIT PROCEDURE EDGE 90 (KITS) IMPLANT
KIT TURNOVER KIT B (KITS) ×4 IMPLANT
MARKER FIDUCIAL SL NIT COIL (Implant Marker) ×12 IMPLANT
MARKER SKIN DUAL TIP RULER LAB (MISCELLANEOUS) ×4 IMPLANT
NEEDLE SUPERTRX PREMARK BIOPSY (NEEDLE) ×4 IMPLANT
NS IRRIG 1000ML POUR BTL (IV SOLUTION) ×4 IMPLANT
OIL SILICONE PENTAX (PARTS (SERVICE/REPAIRS)) ×4 IMPLANT
PAD ARMBOARD 7.5X6 YLW CONV (MISCELLANEOUS) ×8 IMPLANT
PATCHES PATIENT (LABEL) ×12 IMPLANT
STOPCOCK 4 WAY LG BORE MALE ST (IV SETS) ×4 IMPLANT
SYR 20CC LL (SYRINGE) ×4 IMPLANT
SYR 20ML ECCENTRIC (SYRINGE) ×4 IMPLANT
SYR 50ML SLIP (SYRINGE) ×4 IMPLANT
SYSTEM GENCUT CORE BIOPSY (NEEDLE) ×4 IMPLANT
TOWEL OR 17X24 6PK STRL BLUE (TOWEL DISPOSABLE) ×4 IMPLANT
TRAP SPECIMEN MUCOUS 40CC (MISCELLANEOUS) IMPLANT
TUBE CONNECTING 20'X1/4 (TUBING) ×1
TUBE CONNECTING 20X1/4 (TUBING) ×3 IMPLANT
UNDERPAD 30X30 (UNDERPADS AND DIAPERS) ×4 IMPLANT
VALVE DISPOSABLE (MISCELLANEOUS) ×4 IMPLANT
WATER STERILE IRR 1000ML POUR (IV SOLUTION) ×4 IMPLANT

## 2017-08-03 NOTE — Anesthesia Preprocedure Evaluation (Addendum)
Anesthesia Evaluation  Patient identified by MRN, date of birth, ID band Patient awake  General Assessment Comment:Mild confusion, husband confirming history  Reviewed: Allergy & Precautions, NPO status , Patient's Chart, lab work & pertinent test results  History of Anesthesia Complications Negative for: history of anesthetic complications  Airway Mallampati: I  TM Distance: >3 FB Neck ROM: Full    Dental  (+) Edentulous Upper, Edentulous Lower   Pulmonary COPD, former smoker (quit '08),  Lung mass   breath sounds clear to auscultation       Cardiovascular hypertension, Pt. on medications (-) angina+ CAD and + Cardiac Stents  + dysrhythmias Supra Ventricular Tachycardia  Rhythm:Regular Rate:Normal  3/19 ECHO: EF 65-70% '16 Myoview: Normal study with no evidence of ischemia or infarction   Neuro/Psych Anxiety MRI of the brain with numerous small acute infarcts CVA, No Residual Symptoms    GI/Hepatic negative GI ROS, Neg liver ROS,   Endo/Other  negative endocrine ROS  Renal/GU negative Renal ROS     Musculoskeletal  (+) Arthritis ,   Abdominal   Peds  Hematology Hb 11.6   Anesthesia Other Findings   Reproductive/Obstetrics                            Anesthesia Physical Anesthesia Plan  ASA: III  Anesthesia Plan: General   Post-op Pain Management:    Induction: Intravenous  PONV Risk Score and Plan: 3 and Ondansetron, Dexamethasone and Treatment may vary due to age or medical condition  Airway Management Planned: Oral ETT  Additional Equipment:   Intra-op Plan:   Post-operative Plan: Extubation in OR  Informed Consent: I have reviewed the patients History and Physical, chart, labs and discussed the procedure including the risks, benefits and alternatives for the proposed anesthesia with the patient or authorized representative who has indicated his/her understanding and  acceptance.   Consent reviewed with POA  Plan Discussed with: CRNA and Surgeon  Anesthesia Plan Comments: (Plan routine monitors, GETA)        Anesthesia Quick Evaluation

## 2017-08-03 NOTE — Anesthesia Postprocedure Evaluation (Signed)
Anesthesia Post Note  Patient: Ruthellen Tippy  Procedure(s) Performed: VIDEO BRONCHOSCOPY WITH ENDOBRONCHIAL NAVIGATION (N/A Chest) PLACEMENT OF FUDUCIAL (Left Chest)     Patient location during evaluation: PACU Anesthesia Type: General Level of consciousness: awake and alert, oriented and patient cooperative Pain management: pain level controlled Vital Signs Assessment: post-procedure vital signs reviewed and stable Respiratory status: spontaneous breathing, nonlabored ventilation and respiratory function stable Cardiovascular status: blood pressure returned to baseline and stable Postop Assessment: no apparent nausea or vomiting Anesthetic complications: no    Last Vitals:  Vitals:   08/03/17 1445 08/03/17 1519  BP: 133/63 (!) 145/60  Pulse: 70 67  Resp: 16 16  Temp: 36.6 C 36.9 C  SpO2: 95% 99%    Last Pain:  Vitals:   08/03/17 1519  TempSrc: Oral  PainSc:                  [place] Haydon,E. Raquel Sayres

## 2017-08-03 NOTE — Interval H&P Note (Signed)
PCCM Interval Note  Pt presents today for further eval of hypermetabolic LLL nodule / mass.  No new issues reported, no CP, dyspnea, hemoptysis  Vitals:   08/03/17 0857 08/03/17 0909  BP: (!) 157/51   Pulse: (!) 55   Resp: 20   Temp: 98.4 F (36.9 C)   TempSrc: Oral   SpO2: 97%   Weight:  47.6 kg (105 lb)  Height:  5\' 2"  (1.575 m)   Gen: Pleasant, thin elderly woman, in no distress,  normal affect  ENT: No lesions,  mouth clear,  oropharynx clear, no postnasal drip  Neck: No JVD, no stridor  Lungs: No use of accessory muscles, clear bilaterally  Cardiovascular: RRR, heart sounds normal, no murmur or gallops, no peripheral edema  Musculoskeletal: No deformities, no cyanosis or clubbing  Neuro: alert, non focal  Skin: Warm, no lesions or rashes   Impression: LLL mass / nodule, suspicious for malignancy.  Plan for navigational FOB w biopsies, possible fiducials.  Discussed risks and benefits, all questions answered. No barriers identified.   Baltazar Apo, MD, PhD 08/03/2017, 10:48 AM [facility] [phone] or if no answer [phone]

## 2017-08-03 NOTE — Discharge Instructions (Signed)
Flexible Bronchoscopy, Care After These instructions give you information on caring for yourself after your procedure. Your doctor may also give you more specific instructions. Call your doctor if you have any problems or questions after your procedure. Follow these instructions at home:  Do not eat or drink anything for 2 hours after your procedure. If you try to eat or drink before the medicine wears off, food or drink could go into your lungs. You could also burn yourself.  After 2 hours have passed and when you can cough and gag normally, you may eat soft food and drink liquids slowly.  The day after the test, you may eat your normal diet.  You may do your normal activities.  Keep all doctor visits. Get help right away if:  You get more and more short of breath.  You get light-headed.  You feel like you are going to pass out (faint).  You have chest pain.  You have new problems that worry you.  You cough up more than a little blood.  You cough up more blood than before.  Please call our office for any questions or concerns, [phone].  This information is not intended to replace advice given to you by your health care provider. Make sure you discuss any questions you have with your health care provider. Document Released: 01/17/2009 Document Revised: 08/28/2015 Document Reviewed: 11/24/2012 Elsevier Interactive Patient Education  2017 [employer].

## 2017-08-03 NOTE — Progress Notes (Signed)
Pt complaining of sudden onset of aching on her left breast.  Pt stated that it began when she sat up to eat.  MD notified and new orders received.  Will give Tylenol and continue to monitor.  Eliezer [place]

## 2017-08-03 NOTE — Progress Notes (Signed)
I was asked to follow-up on patient's CXR. On my read she has a small pneumothorax on left that is unchanged from prior. She remains hemodynamically stable. I called and discussed with patient's bedside RN. Will place patient on 100% FIO2 via face mask to help this small pneumothorax reabsorb faster. If any changes overnight, we can place a chest tube if needed. Followup CXR in AM.

## 2017-08-03 NOTE — Op Note (Signed)
Video Bronchoscopy with Electromagnetic Navigation Procedure Note  Date of Operation: 08/03/2017  Pre-op Diagnosis: Left lower lobe mass  Post-op Diagnosis: Left lower lobe mass  Surgeon: Baltazar Apo  Assistants: None  Anesthesia: General endotracheal anesthesia  Operation: Flexible video fiberoptic bronchoscopy with electromagnetic navigation and biopsies.  Estimated Blood Loss: Minimal  Complications: None apparent  Indications and History: [name] [name] is a 83 y.o. female with a hypermetabolic left lower lobe mass noted on CT scan and then PET scan.  Recommendation was made to achieve tissue diagnosis via navigational bronchoscopy with biopsies.  The risks, benefits, complications, treatment options and expected outcomes were discussed with the patient.  The possibilities of pneumothorax, pneumonia, reaction to medication, pulmonary aspiration, perforation of a viscus, bleeding, failure to diagnose a condition and creating a complication requiring transfusion or operation were discussed with the patient who freely signed the consent.    Description of Procedure: The patient was seen in the Preoperative Area, was examined and was deemed appropriate to proceed.  The patient was taken to OR 10, identified as [name] [name] and the procedure verified as Flexible Video Fiberoptic Bronchoscopy.  A Time Out was held and the above information confirmed.   Prior to the date of the procedure a high-resolution CT scan of the chest was performed. Utilizing Koppel a virtual tracheobronchial tree was generated to allow the creation of distinct navigation pathways to the patient's parenchymal abnormality. After being taken to the operating room general anesthesia was initiated and the patient  was orally intubated. The video fiberoptic bronchoscope was introduced via the endotracheal tube and a general inspection was performed which showed normal airways throughout. The extendable working  channel and locator guide were introduced into the bronchoscope. The distinct navigation pathway prepared prior to this procedure were then utilized to navigate to within 0.6 cm of patient's lesion identified on CT scan. The extendable working channel was secured into place and the locator guide was withdrawn. Under fluoroscopic guidance transbronchial needle brushings, transbronchial Wang needle biopsies, and transbronchial forceps biopsies were performed to be sent for cytology and pathology. A bronchioalveolar lavage was performed in the left lower lobe adjacent to the lesion and sent for cytology and microbiology (bacterial, fungal, AFB smears and cultures).  Under fluoroscopic guidance 3 fiducial markers were placed triangulating the lesion to facilitate stereotactic radiation therapy should this be indicated in the future.  At the end of the procedure a general airway inspection was performed and there was no evidence of active bleeding. The bronchoscope was removed.  The patient tolerated the procedure well. There was no significant blood loss and there were no obvious complications. A post-procedural chest x-ray is pending.  Samples: 1. Transbronchial needle brushings from left lower lobe mass 2. Transbronchial Wang needle biopsies and Gen-Cut biopsies from left lower lobe mass 3. Transbronchial forceps biopsies from left lower lobe mass 4. Bronchoalveolar lavage from left lower lobe  Plans:  The patient will be discharged from the PACU to home when recovered from anesthesia and after chest x-ray is reviewed. We will review the cytology, pathology and microbiology results with the patient when they become available. Outpatient followup will be with Dr Vaughan Browner.    Baltazar Apo, MD, PhD 08/03/2017, 12:38 PM  [facility] [phone] or if no answer [phone]

## 2017-08-03 NOTE — H&P (Signed)
Name: [name] [name] MRN: [id number] DOB: 09/15/1934    ADMISSION DATE:  08/03/2017   CHIEF COMPLAINT:  Left iatrogenic pneumothorax  SIGNIFICANT EVENTS  Navigational bronchoscopy 08/03/2017  STUDIES:  Serial chest x-rays 08/03/2017 >> stable small left pneumothorax LLL TBBx's 5/1 >>  LLL needle bx's 5/1 >>  LLL brushings 5/1 >>  LLL BAL 5/1 >>   HISTORY OF PRESENT ILLNESS: Pleasant 83 year old woman with a history of remote tobacco abuse (40 pack years), hypertension, coronary artery disease, hyperlipidemia and stroke.  She was found to have a spurious left lower lobe mass as well as scattered bilateral 2 to 3 mm lung nodules on a CT scan of her chest that was done on 06/18/2017.  Subsequent PET scan that was done on 07/22/2017 showed that the lesion was hypermetabolic without any evidence for distant disease.  She underwent navigational bronchoscopy on 5/1 with transbronchial biopsies, brushings, needle sampling and BAL.  3 fiducial markers were placed around the left lower lobe lesion as well.  Postoperative chest x-ray in the recovery area revealed a small left-sided pneumothorax.  She does not complain of any pain, dyspnea, cough.  She will be admitted for observation, followed with serial imaging.  PAST MEDICAL HISTORY :   has a past medical history of Anxiety, Arthritis, CAD (coronary artery disease) (2008), HTN (hypertension), Hypercholesterolemia, Ischemic stroke ([place]), Mass of lower lobe of left lung (08/2017), and Pneumonia (5/16).  has a past surgical history that includes PCI of hte lesion in th marginal branch circumflex artery; lower back surgery (1976); Partial hysterectomy (1977); Neck surgery (1990); Appendectomy (1950); Coronary angioplasty with stent; Ganglion cyst excision (Right, 12/26/2014); TEE without cardioversion (N/A, 06/21/2017); and Bronchoscopy (08/03/2017). Prior to Admission medications   Medication Sig Start Date End Date Taking? Authorizing Provider  atorvastatin  (LIPITOR) 20 MG tablet Take 1 tablet (20 mg total) by mouth daily at 6 PM. 06/21/17  Yes Starla Link, Kshitiz, MD  felodipine (PLENDIL) 10 MG 24 hr tablet Take 10 mg by mouth daily.     Yes [provider]  Multiple Vitamin (MULTIVITAMIN WITH MINERALS) TABS tablet Take 1 tablet by mouth daily. Patient taking differently: Take 1 tablet by mouth 2 (two) times a week.  05/11/17  Yes Georgette Shell, MD  nitroGLYCERIN (NITROSTAT) 0.4 MG SL tablet Place 1 tablet (0.4 mg total) under the tongue every 5 (five) minutes as needed for chest pain. 07/10/14  Yes Josue Hector, MD  aspirin 81 MG tablet Take 1 tablet (81 mg total) by mouth 3 (three) times a week. 08/03/17   Collene Gobble, MD  EPINEPHrine (EPIPEN 2-PAK) 0.3 mg/0.3 mL IJ SOAJ injection Inject 0.3 mg into the muscle once as needed (for anaphylaxis).    [provider]   Allergies  Allergen Reactions  . Other Anaphylaxis    Food or preservative allergy of unknown/undetermined origin (happened last in 2017 when the patient had gone out to eat and hadn't yet touched her steak??)    FAMILY HISTORY:  family history includes Heart failure in her mother; Hypertension in her mother. SOCIAL HISTORY:  reports that she quit smoking about 11 years ago. Her smoking use included cigarettes. She has a 40.00 pack-year smoking history. She has never used smokeless tobacco. She reports that she does not drink alcohol or use drugs.  REVIEW OF SYSTEMS:   Constitutional: Negative for fever, chills, weight loss, malaise/fatigue and diaphoresis.  HENT: Negative for hearing loss, ear pain, nosebleeds, congestion, sore throat, neck pain, tinnitus  and ear discharge.   Eyes: Negative for blurred vision, double vision, photophobia, pain, discharge and redness.  Respiratory: Negative for cough, hemoptysis, sputum production, shortness of breath, wheezing and stridor.   Cardiovascular: Negative for chest pain, palpitations, orthopnea, claudication, leg  swelling and PND.  Gastrointestinal: Negative for heartburn, nausea, vomiting, abdominal pain, diarrhea, constipation, blood in stool and melena.  Genitourinary: Negative for dysuria, urgency, frequency, hematuria and flank pain.  Musculoskeletal: Negative for myalgias, back pain, joint pain and falls.  Skin: Negative for itching and rash.  Neurological: Negative for dizziness, tingling, tremors, sensory change, speech change, focal weakness, seizures, loss of consciousness, weakness and headaches.  Endo/Heme/Allergies: Negative for environmental allergies and polydipsia. Does not bruise/bleed easily.  SUBJECTIVE:  Patient is recovered from her anesthesia, is awake, feels well and denies any discomfort on room air  VITAL SIGNS: Temp:  [97.5 F (36.4 C)-98.4 F (36.9 C)] 98.4 F (36.9 C) (05/01 1519) Pulse Rate:  [55-71] 67 (05/01 1519) Resp:  [14-20] 16 (05/01 1519) BP: (131-157)/(51-67) 145/60 (05/01 1519) SpO2:  [92 %-100 %] 99 % (05/01 1519) Weight:  [47.6 kg (105 lb)] 47.6 kg (105 lb) (05/01 0909)  PHYSICAL EXAMINATION: General: Thin elderly woman in no distress on room air Neuro: No focal deficits, awake, alert, interacting, a bit tired postanesthesia HEENT: No oral lesions, pupils equal and reactive Cardiovascular: Regular, no murmur, no peripheral edema Lungs: Decreased somewhat at the left base, no wheezing, no crackles Abdomen: Soft, benign with positive bowel sounds Musculoskeletal: No deformities Skin: No rash  Recent Labs  Lab 08/03/17 0908  NA 142  K 3.8  CL 107  CO2 26  BUN 8  CREATININE 0.85  GLUCOSE 92   Recent Labs  Lab 08/03/17 0908  HGB 11.6*  HCT 37.1  WBC 10.2  PLT 344   Dg Chest Port 1 View  Result Date: 08/03/2017 CLINICAL DATA:  Left pneumothorax after biopsy. EXAM: PORTABLE CHEST 1 VIEW COMPARISON:  Radiograph of same day. FINDINGS: The heart size and mediastinal contours are within normal limits. Atherosclerosis of thoracic aorta is noted.  Right lung is clear. Stable mild left apical and basilar pneumothorax is noted compared to prior exam. The visualized skeletal structures are unremarkable. IMPRESSION: Stable mild left apical and basilar pneumothorax compared to prior exam. Electronically Signed   By: Marijo Conception, M.D.   On: 08/03/2017 16:26   Dg Chest Port 1 View  Result Date: 08/03/2017 CLINICAL DATA:  Status post bronchoscopy EXAM: PORTABLE CHEST 1 VIEW COMPARISON:  07/28/2017 FINDINGS: There are changes consistent with previous bronchoscopy with fiducial markers surrounding the patient's known left lower lobe mass. There is a mild pneumothorax identified with approximately 1 cm excursion along the apex as well as laterally in the left lung base. No other focal abnormality is seen. IMPRESSION: Left-sided pneumothorax with approximately 1 cm excursion as described. Critical Value/emergent results were called by telephone at the time of interpretation on 08/03/2017 at 1:20 pm to Dr. Baltazar Apo , who verbally acknowledged these results. Electronically Signed   By: Inez Catalina M.D.   On: 08/03/2017 13:22   Dg C-arm Bronchoscopy  Result Date: 08/03/2017 C-ARM BRONCHOSCOPY: Fluoroscopy was utilized by the requesting physician.  No radiographic interpretation.    ASSESSMENT / PLAN:  Iatrogenic left pneumothorax following transbronchial biopsies -Admit for observation.  Follow serial chest x-rays.  If patient develops symptoms or if there is an increased pneumothorax on imaging then favor small pigtail catheter insertion for drainage until air leak  resolves.  Left lower lobe mass, suspicious for primary malignancy -Await pathology, cytology, micro results from bronchoscopy on 08/03/2017 -Fiducial markers placed in the event that radiation therapy is indicated  History of coronary artery disease, cleared by cardiology prior to procedure -Atorvastatin Restart aspirin on 5/2  Hypertension -Felodipine per home regimen  History of  CVA -Aspirin as above   Baltazar Apo, MD, PhD 08/03/2017, 5:51 PM [facility] [phone] or if no answer [phone]

## 2017-08-03 NOTE — Transfer of Care (Signed)
Immediate Anesthesia Transfer of Care Note  Patient: [name] [name]  Procedure(s) Performed: VIDEO BRONCHOSCOPY WITH ENDOBRONCHIAL NAVIGATION (N/A Chest) PLACEMENT OF FUDUCIAL (Left Chest)  Patient Location: PACU  Anesthesia Type:General  Level of Consciousness: awake, alert  and oriented  Airway & Oxygen Therapy: Patient Spontanous Breathing and Patient connected to face mask oxygen  Post-op Assessment: Report given to RN and Post -op Vital signs reviewed and stable  Post vital signs: Reviewed and stable  Last Vitals:  Vitals Value Taken Time  BP    Temp    Pulse 69 08/03/2017 12:58 PM  Resp 18 08/03/2017 12:58 PM  SpO2 99 % 08/03/2017 12:58 PM  Vitals shown include unvalidated device data.  Last Pain:  Vitals:   08/03/17 0909  TempSrc:   PainSc: 0-No pain      Patients Stated Pain Goal: 2 (67/01/41 0301)  Complications: No apparent anesthesia complications

## 2017-08-03 NOTE — Anesthesia Procedure Notes (Signed)
Procedure Name: Intubation Date/Time: 08/03/2017 11:05 AM Performed by: Genelle Bal, CRNA Pre-anesthesia Checklist: Patient identified, Emergency Drugs available, Suction available and Patient being monitored Patient Re-evaluated:Patient Re-evaluated prior to induction Oxygen Delivery Method: Circle system utilized Preoxygenation: Pre-oxygenation with 100% oxygen Induction Type: IV induction Ventilation: Mask ventilation without difficulty Laryngoscope Size: Miller and 2 Grade View: Grade I Tube type: Oral Tube size: 8.5 mm Number of attempts: 1 Airway Equipment and Method: Stylet and Oral airway Placement Confirmation: ETT inserted through vocal cords under direct vision,  positive ETCO2 and breath sounds checked- equal and bilateral Secured at: 20 cm Tube secured with: Tape Dental Injury: Teeth and Oropharynx as per pre-operative assessment

## 2017-08-04 ENCOUNTER — Ambulatory Visit: Payer: Medicare Other | Admitting: Cardiology

## 2017-08-04 ENCOUNTER — Other Ambulatory Visit: Payer: Self-pay | Admitting: Pulmonary Disease

## 2017-08-04 ENCOUNTER — Inpatient Hospital Stay (HOSPITAL_COMMUNITY): Payer: Medicare Other

## 2017-08-04 ENCOUNTER — Encounter (HOSPITAL_COMMUNITY): Payer: Self-pay | Admitting: Emergency Medicine

## 2017-08-04 DIAGNOSIS — R918 Other nonspecific abnormal finding of lung field: Secondary | ICD-10-CM | POA: Diagnosis not present

## 2017-08-04 DIAGNOSIS — J95811 Postprocedural pneumothorax: Secondary | ICD-10-CM

## 2017-08-04 DIAGNOSIS — Z9889 Other specified postprocedural states: Secondary | ICD-10-CM

## 2017-08-04 LAB — TROPONIN I
Troponin I: <0.03 ng/mL (ref <0.03)
Troponin I: <0.03 ng/mL (ref <0.03)

## 2017-08-04 LAB — ACID FAST SMEAR (AFB, MYCOBACTERIA): Acid Fast Smear: NEGATIVE

## 2017-08-04 LAB — ACID FAST SMEAR (AFB)

## 2017-08-04 MED ORDER — ASPIRIN 81 MG PO TABS: 81.0000 mg | ORAL_TABLET | ORAL | Status: DC

## 2017-08-04 NOTE — Discharge Summary (Signed)
Physician Discharge Summary  Patient ID: [name] [name] MRN: [id number] DOB/AGE: Jun 14, 1934 83 y.o.  Admit date: 08/03/2017 Discharge date: 08/04/2017    Discharge Diagnoses:  Left Iatrogenic Pneumothorax  Former Tobacco Abuse  LLL Mass - hypermetabolic on PET Pulmonary Nodules                                                                      DISCHARGE PLAN BY DIAGNOSIS      Left Iatrogenic Pneumothorax  Former Tobacco Abuse  LLL Mass - hypermetabolic on PET Pulmonary Nodules   Discharge Plan: Follow up CXR on 5/6 at [facility]  Follow up visit with Dr. Vaughan Browner on 5/10 Await cytology  Pt instructed to return to the ER if new or worsening symptoms, SOB, chest pain, syncope etc.                      DISCHARGE SUMMARY   83 year old F with a history of remote tobacco abuse (40 pack years), hypertension, coronary artery disease, hyperlipidemia and stroke.  She was found to have a spurious left lower lobe mass as well as scattered bilateral 2 to 3 mm lung nodules on a CT scan of her chest that was done on 06/18/2017.  Subsequent PET scan that was done on 07/22/2017 showed that the lesion was hypermetabolic without any evidence for distant disease.  She underwent navigational bronchoscopy on 5/1 with transbronchial biopsies, brushings, needle sampling and BAL.  3 fiducial markers were placed around the left lower lobe lesion as well.  Postoperative chest x-ray in the recovery area revealed a small left-sided pneumothorax.  She does not complain of any pain, dyspnea, cough.  She was admitted for observation, followed with serial imaging.  Pneumothorax remained stable.  The patient was felt to be medically stable for discharge on 5/2 with plans as above.     SIGNIFICANT EVENTS  Navigational bronchoscopy 08/03/2017           STUDIES:  Serial chest x-rays 08/03/2017 >> stable small left pneumothorax LLL TBBx's 5/1 >>  LLL needle bx's 5/1 >>  LLL brushings 5/1 >>  LLL BAL 5/1 >>     Discharge Exam: General: elderly female in NAD Neuro:  AAOx4, speech clear, MAE, non-focal CV: s1s2 rrr PULM: non-labored on RA, lungs clear GI: soft, nontender, tolerating PO's  Extremities: warm/dry, no edema   Vitals:   08/03/17 2058 08/04/17 0647 08/04/17 0824 08/04/17 1218  BP: (!) 126/48 (!) 131/58 (!) 139/53 (!) 106/46  Pulse: 75 60 65 67  Resp: 16 16    Temp: 97.6 F (36.4 C) 97.8 F (36.6 C) 98.7 F (37.1 C) 98.3 F (36.8 C)  TempSrc: Oral Oral Oral Oral  SpO2: 96% 98% 98% 93%  Weight:      Height:         Discharge Labs  BMET Recent Labs  Lab 08/03/17 0908  NA 142  K 3.8  CL 107  CO2 26  GLUCOSE 92  BUN 8  CREATININE 0.85  CALCIUM 9.3    CBC Recent Labs  Lab 08/03/17 0908  HGB 11.6*  HCT 37.1  WBC 10.2  PLT 344    Anti-Coagulation No results for input(s): INR in the last 168 hours.  Discharge Instructions    Diet - low sodium heart healthy   Complete by:  As directed    Discharge instructions   Complete by:  As directed    1.  Review your medications carefully.   2.  Follow up on 5/6 during the day at [facility] for chest xray  3.  See Dr. Vaughan Browner on 5/10 for follow up visit  4.  Report to the ER immediately if you have new shortness of breath, chest pain, passing out or new concerns.  5.  Call our office if you have any questions.   Discharge patient   Complete by:  As directed    Discharge disposition:  01-Home or Self Care   Discharge patient date:  08/03/2017   Increase activity slowly   Complete by:  As directed        Follow-up Information    Marshell Garfinkel, MD Follow up on 08/12/2017.   Specialty:  Pulmonary Disease Why:  Appt at 2:15 PM.   Contact information: [address] [place] [address] [phone]        [facility] Follow up on 08/08/2017.   Why:  Please go to [facility] on 5/6 for chest XRAY.  You can go at anytime during the day and report to radiology.  You will not  be seen by a provider that day.  You are to see Dr. Vaughan Browner on 5/10.            Allergies as of 08/04/2017      Reactions   Other Anaphylaxis   Food or preservative allergy of unknown/undetermined origin (happened last in 2017 when the patient had gone out to eat and hadn't yet touched her steak??)      Medication List    TAKE these medications   aspirin 81 MG tablet Take 1 tablet (81 mg total) by mouth 3 (three) times a week. Start taking on:  08/05/2017   atorvastatin 20 MG tablet Commonly known as:  LIPITOR Take 1 tablet (20 mg total) by mouth daily at 6 PM.   EPIPEN 2-PAK 0.3 mg/0.3 mL Soaj injection Generic drug:  EPINEPHrine Inject 0.3 mg into the muscle once as needed (for anaphylaxis).   felodipine 10 MG 24 hr tablet Commonly known as:  PLENDIL Take 10 mg by mouth daily.   multivitamin with minerals Tabs tablet Take 1 tablet by mouth daily. What changed:  when to take this   nitroGLYCERIN 0.4 MG SL tablet Commonly known as:  NITROSTAT Place 1 tablet (0.4 mg total) under the tongue every 5 (five) minutes as needed for chest pain.         Disposition:  Home.  No new home health needs identified at discharge  Discharged Condition: [name] [name] has met maximum benefit of inpatient care and is medically stable and cleared for discharge.  Patient is pending follow up as above.      Time spent on disposition:  35 Minutes.   Signed: Noe Gens, NP-C [facility] Pgr: [phone] Office: [phone]

## 2017-08-04 NOTE — Plan of Care (Signed)
  Problem: Education: Goal: Knowledge of General Education information will improve Outcome: Progressing Note:  POC reviewed with pt.; pt. states she 's good with nasal cannula; doesn't want to try mask- asked earlier in shift.

## 2017-08-04 NOTE — Progress Notes (Signed)
Follow up appt on 5/10 with Dr. Vaughan Browner.  Will obtain CXR on 5/6 for follow up.     Noe Gens, NP-C [facility] Pgr: [phone] or if no answer [phone] 08/04/2017, 1:48 PM

## 2017-08-05 ENCOUNTER — Telehealth: Payer: Self-pay | Admitting: Emergency Medicine

## 2017-08-05 DIAGNOSIS — R918 Other nonspecific abnormal finding of lung field: Secondary | ICD-10-CM

## 2017-08-05 LAB — CULTURE, RESPIRATORY W GRAM STAIN: Culture: NO GROWTH

## 2017-08-05 NOTE — Telephone Encounter (Signed)
Tried to call pt to discuss results. Cytology shows atypical cell but can't call malignancy. The biopsies were non-diagnostic. I suspect that this is lung cancer. Will need to decide whether to treat based on the data we've collected. She has an OV with Dr Vaughan Browner next week to make plans.

## 2017-08-08 ENCOUNTER — Telehealth: Payer: Self-pay | Admitting: Emergency Medicine

## 2017-08-08 DIAGNOSIS — R918 Other nonspecific abnormal finding of lung field: Secondary | ICD-10-CM

## 2017-08-08 NOTE — Telephone Encounter (Signed)
Pt is aware of below message and voiced her understanding.  PFT has been ordered and scheduled for 08/12/17 at 3:00. Nothing further is needed.

## 2017-08-08 NOTE — Telephone Encounter (Signed)
Per Dr. Vaughan Browner verbally- arrange PFT prior to OV on 08/12/17 vs after.  Lmtcb x1 for pt.

## 2017-08-08 NOTE — Telephone Encounter (Signed)
Please see 08/05/17 phone note.

## 2017-08-08 NOTE — Telephone Encounter (Signed)
Per Dr. Vaughan Browner- schedule PFT on 08/12/17. Okay to schedule PFT after OV. lmtcb x 1 for pt.

## 2017-08-08 NOTE — Telephone Encounter (Signed)
Referral to [facility] has been made.  Nothing further is needed at this time.

## 2017-08-08 NOTE — Telephone Encounter (Signed)
Pt has been scheduled for PFT 08/11/17 at 4:00p. Pt is aware and voiced her understanding. Nothing further is needed.

## 2017-08-08 NOTE — Telephone Encounter (Signed)
I was able to reach [name] [name] today. I told them that the biopsy was inconclusive for a malignancy but showed atypical cells. I have also discussed with Dr. Lamonte Sakai. The next step would be to refer to [name] to have them evaluate and see if they would treat without a path diagnosis.   Discussed this with the family and they understand. I will review further at time of clinic visit on 5/10  Marshell Garfinkel MD [facility] 08/08/2017, 9:49 AM

## 2017-08-09 ENCOUNTER — Telehealth: Payer: Self-pay | Admitting: Pulmonary Disease

## 2017-08-10 ENCOUNTER — Inpatient Hospital Stay: Payer: Medicare Other | Admitting: Adult Health

## 2017-08-11 ENCOUNTER — Encounter: Payer: Self-pay | Admitting: *Deleted

## 2017-08-11 ENCOUNTER — Ambulatory Visit (INDEPENDENT_AMBULATORY_CARE_PROVIDER_SITE_OTHER): Payer: Medicare Other | Admitting: Emergency Medicine

## 2017-08-11 ENCOUNTER — Other Ambulatory Visit: Payer: Self-pay | Admitting: *Deleted

## 2017-08-11 DIAGNOSIS — R918 Other nonspecific abnormal finding of lung field: Secondary | ICD-10-CM | POA: Diagnosis not present

## 2017-08-11 LAB — PULMONARY FUNCTION TEST
DL/VA % PRED: 58 %
DL/VA: 2.65 ml/min/mmHg/L
DLCO cor % pred: 39 %
DLCO cor: 8.42 ml/min/mmHg
DLCO unc % pred: 36 %
DLCO unc: 7.91 ml/min/mmHg
FEF 25-75 PRE: 0.41 L/s
FEF 25-75 Post: 0.61 L/sec
FEF2575-%Change-Post: 50 %
FEF2575-%PRED-PRE: 32 %
FEF2575-%Pred-Post: 49 %
FEV1-%Change-Post: 13 %
FEV1-%PRED-PRE: 49 %
FEV1-%Pred-Post: 56 %
FEV1-Post: 0.97 L
FEV1-Pre: 0.85 L
FEV1FVC-%CHANGE-POST: -8 %
FEV1FVC-%Pred-Pre: 76 %
FEV6-%CHANGE-POST: 21 %
FEV6-%PRED-POST: 83 %
FEV6-%PRED-PRE: 68 %
FEV6-PRE: 1.5 L
FEV6-Post: 1.83 L
FEV6FVC-%CHANGE-POST: -1 %
FEV6FVC-%Pred-Post: 103 %
FEV6FVC-%Pred-Pre: 105 %
FVC-%CHANGE-POST: 24 %
FVC-%Pred-Post: 80 %
FVC-%Pred-Pre: 65 %
FVC-Post: 1.88 L
FVC-Pre: 1.51 L
POST FEV1/FVC RATIO: 52 %
POST FEV6/FVC RATIO: 97 %
PRE FEV1/FVC RATIO: 57 %
Pre FEV6/FVC Ratio: 99 %
RV % pred: 126 %
RV: 2.92 L
TLC % pred: 93 %
TLC: 4.46 L

## 2017-08-11 NOTE — Progress Notes (Signed)
PFT completed 08/11/17

## 2017-08-11 NOTE — Progress Notes (Signed)
I updated Dr. Vaughan Browner on cancer conference discussion.

## 2017-08-12 ENCOUNTER — Encounter: Payer: Self-pay | Admitting: *Deleted

## 2017-08-12 ENCOUNTER — Encounter: Payer: Self-pay | Admitting: Pulmonary Disease

## 2017-08-12 ENCOUNTER — Ambulatory Visit (INDEPENDENT_AMBULATORY_CARE_PROVIDER_SITE_OTHER)
Admission: RE | Admit: 2017-08-12 | Discharge: 2017-08-12 | Disposition: A | Discharge status: Home / Self Care | Payer: Medicare Other | Source: Ambulatory Visit | Attending: Pulmonary Disease | Admitting: Pulmonary Disease

## 2017-08-12 ENCOUNTER — Ambulatory Visit: Payer: Medicare Other | Admitting: Pulmonary Disease

## 2017-08-12 VITALS — BP 118/72 | HR 94 | Ht 62.0 in | Wt 105.0 lb

## 2017-08-12 DIAGNOSIS — J939 Pneumothorax, unspecified: Secondary | ICD-10-CM

## 2017-08-12 DIAGNOSIS — R918 Other nonspecific abnormal finding of lung field: Secondary | ICD-10-CM

## 2017-08-12 NOTE — Patient Instructions (Addendum)
We will get a chest x-ray today to make sure that the pneumothorax is improving I will discuss further with the oncologist and surgeon about the best step forward One option would be to redo the biopsy to see if he can get a better answer We will keep in touch going forward Follow-up in 1 month.

## 2017-08-12 NOTE — Progress Notes (Signed)
Oncology Nurse Navigator Documentation  Oncology Nurse Navigator Flowsheets 08/12/2017  Navigator Location [facility]  Navigator Encounter Type Other/completed referral to [facility]  Treatment Phase Abnormal Scans  Barriers/Navigation Needs Coordination of Care  Interventions Coordination of Care  Coordination of Care Other  Acuity Level 1  Time Spent with Patient 30

## 2017-08-12 NOTE — Progress Notes (Signed)
[name] [name]    [id number]    1935-02-26  Primary Care Physician:Elkins, Curt Jews, MD  Referring Physician: Leonard Downing, MD [address] [place], [place] [address]  Chief complaint: Follow-up for lung mass  HPI: 83 year old with history of coronary artery disease, hypertension, hyperlipidemia, stroke admitted with weakness and CT findings of small CVA.  She had an incidental finding of left lower lobe mass and has been referred to pulmonary for further evaluation  She is doing well post discharge.  Denies any cough, sputum production, hemoptysis, fever.  Pets: None Occupation: Retired Web designer Exposures: No known exposures, no mold, hot tub Smoking history: 40-pack-year smoking history.  Quit in 2008 Travel History: Not significant  Interim history: Underwent navigational biopsy on 5/1.  Postop she had small left pneumothorax and was kept overnight for observation.  She did not require a chest tube Returns to clinic today to discuss results.  States that breathing is doing fine.  No cough, sputum production, fevers, chills.  Outpatient Encounter Medications as of 08/12/2017  Medication Sig  . aspirin 81 MG tablet Take 1 tablet (81 mg total) by mouth 3 (three) times a week.  Marland Kitchen atorvastatin (LIPITOR) 20 MG tablet Take 1 tablet (20 mg total) by mouth daily at 6 PM.  . EPINEPHrine (EPIPEN 2-PAK) 0.3 mg/0.3 mL IJ SOAJ injection Inject 0.3 mg into the muscle once as needed (for anaphylaxis).  . felodipine (PLENDIL) 10 MG 24 hr tablet Take 10 mg by mouth daily.    . Multiple Vitamin (MULTIVITAMIN WITH MINERALS) TABS tablet Take 1 tablet by mouth daily. (Patient taking differently: Take 1 tablet by mouth 2 (two) times a week. )  . nitroGLYCERIN (NITROSTAT) 0.4 MG SL tablet Place 1 tablet (0.4 mg total) under the tongue every 5 (five) minutes as needed for chest pain.   No facility-administered encounter medications on file as of 08/12/2017.       Allergies as of 08/12/2017 - Review Complete 08/12/2017  Allergen Reaction Noted  . Other Anaphylaxis 06/18/2017    Past Medical History:  Diagnosis Date  . Anxiety    pt denies  . Arthritis    back, legs  . CAD (coronary artery disease) 2008   s/p Taxus Element Perseus Study stent (DES) to OM1 in 08/2006; EF 65%  . HTN (hypertension)   . Hypercholesterolemia   . Ischemic stroke ([facility])   . Mass of lower lobe of left lung 08/2017  . Pneumonia 5/16   hospitalized for 4 days    Past Surgical History:  Procedure Laterality Date  . APPENDECTOMY  1950  . BRONCHOSCOPY  08/03/2017   BIOPSY  . CORONARY ANGIOPLASTY WITH STENT PLACEMENT    . FUDUCIAL PLACEMENT Left 08/03/2017   Procedure: PLACEMENT OF FUDUCIAL;  Surgeon: Collene Gobble, MD;  Location: [facility];  Service: Thoracic;  Laterality: Left;  . GANGLION CYST EXCISION Right 12/26/2014   Procedure: EXCISON OF RIGHT FOOT GANGLION CYST;  Surgeon: Wylene Simmer, MD;  Location: [facility];  Service: Orthopedics;  Laterality: Right;  . lower back surgery  1976  . NECK SURGERY  1990  . PARTIAL HYSTERECTOMY  1977  . PCI of hte lesion in th marginal branch circumflex artery    . TEE WITHOUT CARDIOVERSION N/A 06/21/2017   Procedure: TRANSESOPHAGEAL ECHOCARDIOGRAM (TEE);  Surgeon: Acie Fredrickson Wonda Cheng, MD;  Location: [facility];  Service: Cardiovascular;  Laterality: N/A;  . VIDEO BRONCHOSCOPY WITH ENDOBRONCHIAL NAVIGATION N/A  08/03/2017   Procedure: VIDEO BRONCHOSCOPY WITH ENDOBRONCHIAL NAVIGATION;  Surgeon: Collene Gobble, MD;  Location: [facility];  Service: Thoracic;  Laterality: N/A;    Family History  Problem Relation Age of Onset  . Heart failure Mother   . Hypertension Mother     Social History   Socioeconomic History  . Marital status: Married    Spouse name: Not on file  . Number of children: Not on file  . Years of education: Not on file  . Highest education level: Not on file  Occupational History  . Not on  file  Social Needs  . Financial resource strain: Not on file  . Food insecurity:    Worry: Not on file    Inability: Not on file  . Transportation needs:    Medical: Not on file    Non-medical: Not on file  Tobacco Use  . Smoking status: Former Smoker    Packs/day: 1.00    Years: 40.00    Pack years: 40.00    Types: Cigarettes    Last attempt to quit: 2008    Years since quitting: 11.3  . Smokeless tobacco: Never Used  Substance and Sexual Activity  . Alcohol use: No  . Drug use: No  . Sexual activity: Not on file  Lifestyle  . Physical activity:    Days per week: Not on file    Minutes per session: Not on file  . Stress: Not on file  Relationships  . Social connections:    Talks on phone: Not on file    Gets together: Not on file    Attends religious service: Not on file    Active member of club or organization: Not on file    Attends meetings of clubs or organizations: Not on file    Relationship status: Not on file  . Intimate partner violence:    Fear of current or ex partner: Not on file    Emotionally abused: Not on file    Physically abused: Not on file    Forced sexual activity: Not on file  Other Topics Concern  . Not on file  Social History Narrative  . Not on file    Review of systems: Review of Systems  Constitutional: Negative for fever and chills.  HENT: Negative.   Eyes: Negative for blurred vision.  Respiratory: as per HPI  Cardiovascular: Negative for chest pain and palpitations.  Gastrointestinal: Negative for vomiting, diarrhea, blood per rectum. Genitourinary: Negative for dysuria, urgency, frequency and hematuria.  Musculoskeletal: Negative for myalgias, back pain and joint pain.  Skin: Negative for itching and rash.  Neurological: Negative for dizziness, tremors, focal weakness, seizures and loss of consciousness.  Endo/Heme/Allergies: Negative for environmental allergies.  Psychiatric/Behavioral: Negative for depression, suicidal ideas  and hallucinations.  All other systems reviewed and are negative.  Physical Exam: Blood pressure 118/72, pulse 94, height _0  (1.575 m), weight 105 lb (47.6 kg), SpO2 94 %. Gen:      No acute distress HEENT:  EOMI, sclera anicteric Neck:     No masses; no thyromegaly Lungs:    Clear to auscultation bilaterally; normal respiratory effort CV:         Regular rate and rhythm; no murmurs Abd:      + bowel sounds; soft, non-tender; no palpable masses, no distension Ext:    No edema; adequate peripheral perfusion Skin:      Warm and dry; no rash Neuro: alert and oriented x 3 Psych: normal  mood and affect  Data Reviewed: CT chest 06/18/17- left lower lobe mass measuring 3 cm.  Multiple bilateral 2-3 mm lung nodules.  No mediastinal or hilar lymphadenopathy. Atherosclerosis, left main and three-vessel coronary artery disease. CT abdomen 06/19/17- no evidence of metastatic disease. PET scan 07/21/2017- left lower lobe lung mass with intense FDG uptake.  No hilar or mediastinal adenopathy. Chest x-ray 08/04/2017-small left apical, basilar pneumothorax. I have reviewed the images personally  PFTs 08/11/2017 FVC 1.88 [80%), FEV1 0.97 [56%], F/F 52, TLC 93%, DLCO 36% Moderate-severe obstruction, severe diffusion impairment.  Pathology, lung biopsy 08/03/2017- Biopsy was negative for malignancy.  Atypical cells present on BAL, transbronchial needle aspiration   Assessment:  Left lower lobe mass Suspicious for primary lung malignancy.  No evidence of metastasis or lymphadenopathy on PET scan Underwent navigational biopsy of the left lower lobe lung mass with the results showing atypical cells  Discussed at multidisciplinary tumor board yesterday and recommendation was to do a repeat biopsy. I had a discussion with the patient and husband today regarding this.  They are reluctant to undergo the procedure again and want some more time to think about it.  If the patient does not want repeat procedure done  then we may have to consider radiation even without the path diagnosis.  Left lung pneumothorax Repeat chest x-ray today to ensure continued resolution.  Plan/Recommendations: - Chest x-ray  Marshell Garfinkel MD  Pulmonary and Critical Care 08/12/2017, 2:33 PM  CC: Leonard Downing, *

## 2017-08-15 ENCOUNTER — Encounter: Payer: Self-pay | Admitting: *Deleted

## 2017-08-15 ENCOUNTER — Encounter: Payer: Self-pay | Admitting: Radiation Oncology

## 2017-08-15 DIAGNOSIS — R918 Other nonspecific abnormal finding of lung field: Secondary | ICD-10-CM

## 2017-08-15 NOTE — Progress Notes (Signed)
Oncology Nurse Navigator Documentation  Oncology Nurse Navigator Flowsheets 08/15/2017  Navigator Location [facility]  Navigator Encounter Type Other/I followed up with Dr. Julien Nordmann regarding patient's status. He spoke with Dr. Vaughan Browner. It was discussed patient needs referral to [facility]. I completed referral.   Treatment Phase Abnormal Scans  Barriers/Navigation Needs Coordination of Care  Interventions Coordination of Care  Coordination of Care Other  Acuity Level 2  Time Spent with Patient 30

## 2017-08-18 ENCOUNTER — Institutional Professional Consult (permissible substitution): Payer: Medicare Other | Admitting: Cardiothoracic Surgery

## 2017-08-18 ENCOUNTER — Encounter: Payer: Self-pay | Admitting: Radiation Oncology

## 2017-08-18 ENCOUNTER — Encounter: Payer: Self-pay | Admitting: Cardiothoracic Surgery

## 2017-08-18 ENCOUNTER — Other Ambulatory Visit: Payer: Self-pay

## 2017-08-18 ENCOUNTER — Other Ambulatory Visit: Payer: Self-pay | Admitting: *Deleted

## 2017-08-18 ENCOUNTER — Telehealth: Payer: Self-pay | Admitting: Pulmonary Disease

## 2017-08-18 VITALS — BP 124/60 | HR 73 | Resp 16 | Ht 60.0 in | Wt 103.0 lb

## 2017-08-18 DIAGNOSIS — R918 Other nonspecific abnormal finding of lung field: Secondary | ICD-10-CM

## 2017-08-18 DIAGNOSIS — Z9889 Other specified postprocedural states: Secondary | ICD-10-CM | POA: Diagnosis not present

## 2017-08-18 NOTE — Patient Instructions (Signed)
Pulmonary Nodule A pulmonary nodule is a small, round growth of tissue in the lung. Pulmonary nodules can range in size from less than 1/5 inch (4 mm) to a little bigger than an inch (25 mm). Most pulmonary nodules are detected when imaging tests of the lung are being performed for a different problem. Pulmonary nodules are usually not cancerous (benign). However, some pulmonary nodules are cancerous (malignant). Follow-up treatment or testing is based on the size of the pulmonary nodule and your risk of getting lung cancer. What are the causes? Benign pulmonary nodules can be caused by various things. Some of the causes include:  Bacterial, fungal, or viral infections. This is usually an old infection that is no longer active, but it can sometimes be a current, active infection.  A benign mass of tissue.  Inflammation from conditions such as rheumatoid arthritis.  Abnormal blood vessels in the lungs.  Malignant pulmonary nodules can result from lung cancer or from cancers that spread to the lung from other places in the body. What are the signs or symptoms? Pulmonary nodules usually do not cause symptoms. How is this diagnosed? Most often, pulmonary nodules are found incidentally when an X-ray or CT scan is performed to look for some other problem in the lung area. To help determine whether a pulmonary nodule is benign or malignant, your health care provider will take a medical history and order a variety of tests. Tests done may include:  Blood tests.  A skin test called a tuberculin test. This test is used to determine if you have been exposed to the germ that causes tuberculosis.  Chest X-rays. If possible, a new X-ray may be compared with X-rays you have had in the past.  CT scan. This test shows smaller pulmonary nodules more clearly than an X-ray.  Positron emission tomography (PET) scan. In this test, a safe amount of a radioactive substance is injected into the bloodstream. Then,  the scan takes a picture of the pulmonary nodule. The radioactive substance is eliminated from your body in your urine.  Biopsy. A tiny piece of the pulmonary nodule is removed so it can be checked under a microscope.  How is this treated? Pulmonary nodules that are benign normally do not require any treatment because they usually do not cause symptoms or breathing problems. Your health care provider may want to monitor the pulmonary nodule through follow-up CT scans. The frequency of these CT scans will vary based on the size of the nodule and the risk factors for lung cancer. For example, CT scans will need to be done more frequently if the pulmonary nodule is larger and if you have a history of smoking and a family history of cancer. Further testing or biopsies may be done if any follow-up CT scan shows that the size of the pulmonary nodule has increased. Follow these instructions at home:  Only take over-the-counter or prescription medicines as directed by your health care provider.  Keep all follow-up appointments with your health care provider. Contact a health care provider if:  You have trouble breathing when you are active.  You feel sick or unusually tired.  You do not feel like eating.  You lose weight without trying to.  You develop chills or night sweats. Get help right away if:  You cannot catch your breath, or you begin wheezing.  You cannot stop coughing.  You cough up blood.  You become dizzy or feel like you are going to pass out.  You  have sudden chest pain.  You have a fever or persistent symptoms for more than 2-3 days.  You have a fever and your symptoms suddenly get worse. This information is not intended to replace advice given to you by your health care provider. Make sure you discuss any questions you have with your health care provider. Document Released: 01/17/2009 Document Revised: 08/28/2015 Document Reviewed: 09/11/2012 Elsevier Interactive  Patient Education  2017 [place] Lung cancer occurs when abnormal cells in the lung grow out of control and form a mass (tumor). There are several types of lung cancer. The two most common types are:  Non-small cell. In this type of lung cancer, abnormal cells are larger and grow more slowly than those of small cell lung cancer.  Small cell. In this type of lung cancer, abnormal cells are smaller than those of non-small cell lung cancer. Small cell lung cancer gets worse faster than non-small cell lung cancer.  What are the causes? The leading cause of lung cancer is smoking tobacco. The second leading cause is radon exposure. What increases the risk?  Smoking tobacco.  Exposure to secondhand tobacco smoke.  Exposure to radon gas.  Exposure to asbestos.  Exposure to arsenic in drinking water.  Air pollution.  Family or personal history of lung cancer.  Lung radiation therapy.  Being older than 74 years. What are the signs or symptoms? In the early stages, symptoms may not be present. As the cancer progresses, symptoms may include:  A lasting cough, possibly with blood.  Fatigue.  Unexplained weight loss.  Shortness of breath.  Wheezing.  Chest pain.  Loss of appetite.  Symptoms of advanced lung cancer include:  Hoarseness.  Bone or joint pain.  Weakness.  Nail problems.  Face or arm swelling.  Paralysis of the face.  Drooping eyelids.  How is this diagnosed? Lung cancer can be identified with a physical exam and with tests such as:  A chest X-ray.  A CT scan.  Blood tests.  A biopsy.  After a diagnosis is made, you will have more tests to determine the stage of the cancer. The stages of non-small cell lung cancer are:  Stage 0, also called carcinoma in situ. At this stage, abnormal cells are found in the inner lining of your lung or lungs.  Stage I. At this stage, abnormal cells have grown into a tumor that is no  larger than 5 cm across. The cancer has entered the deeper lung tissue but has not yet entered the lymph nodes or other parts of the body.  Stage II. At this stage, the tumor is 7 cm across or smaller and has entered nearby lymph nodes. Or, the tumor is 5 cm across or smaller and has invaded surrounding tissue but is not found in nearby lymph nodes. There may be more than one tumor present.  Stage III. At this stage, the tumor may be any size. There may be more than one tumor in the lungs. The cancer cells have spread to the lymph nodes and possibly to other organs.  Stage IV. At this stage, there are tumors in both lungs and the cancer has spread to other areas of the body.  The stages of small cell lung cancer are:  Limited. At this stage, the cancer is found only on one side of the chest.  Extensive. At this stage, the cancer is in the lungs and in tissues on the other side of the chest. The  cancer has spread to other organs or is found in the fluid between the layers of your lungs.  How is this treated? Depending on the type and stage of your lung cancer, you may be treated with:  Surgery. This is done to remove a tumor.  Radiation therapy. This treatment destroys cancer cells using X-rays or other types of radiation.  Chemotherapy. This treatment uses medicines to destroy cancer cells.  Targeted therapy. This treatment aims to destroy only cancer cells instead of all cells as other therapies do.  You may also have a combination of treatments. Follow these instructions at home:  Do not use any tobacco products. This includes cigarettes, chewing tobacco, and electronic cigarettes. If you need help quitting, ask your health care provider.  Take medicines only as directed by your health care provider.  Eat a healthy diet. Work with a dietitian to make sure you are getting the nutrition you need.  Consider joining a support group or seeking counseling to help you cope with the  stress of having lung cancer.  Let your cancer specialist (oncologist) know if you are admitted to the hospital.  Keep all follow-up visits as directed by your health care provider. This is important. Contact a health care provider if:  You lose weight without trying.  You have a persistent cough and wheezing.  You feel short of breath.  You tire easily.  You experience bone or joint pain.  You have difficulty swallowing.  You feel hoarse or notice your voice changing.  Your pain medicine is not helping. Get help right away if:  You cough up blood.  You have new breathing problems.  You develop chest pain.  You develop swelling in: ? One or both ankles or legs. ? Your face, neck, or arms.  You are confused.  You experience paralysis in your face or a drooping eyelid. This information is not intended to replace advice given to you by your health care provider. Make sure you discuss any questions you have with your health care provider. Document Released: 06/28/2000 Document Revised: 08/28/2015 Document Reviewed: 07/26/2013 Elsevier Interactive Patient Education  [employer].

## 2017-08-18 NOTE — Progress Notes (Signed)
[address]       [place] [address]             [phone]                    [name] [name] [facility] Medical Record #[id number] Date of Birth: 26-Jan-1935  Referring: Marshell Garfinkel, MD Primary Care: Leonard Downing, MD Primary Cardiologist: Jenkins Rouge, MD  Chief Complaint:    Chief Complaint  Patient presents with  . Lung Mass    Surgical eval for LLLobe lung mass.Marland KitchenPET 4/18, SUPER-D CT CHEST 4/26, ENB 5/1, PFT 08/11/17, scheduled to see Dr. Lisbeth Renshaw on 08/24/17    History of Present Illness:    [name] [name] 83 y.o. female is seen in the office  today for after navigation bronchoscopy to a left lower lobe lung lesion.  The patient has a history of coronary artery disease hypertension hyperlipidemia and was admitted with a stroke and weakness in March.  She was noted to have a small CVA.  During that admission there was incidental finding of the left lower lobe lung mass and she was referred to the pulmonary service for evaluation.  On 5/  1 navigation bronchoscopy was performed, cytology was noted to few atypical cells but not diagnostic of malignancy.    Had been admitted in February with elevated troponin in the setting of N, V and diarrhea and feeling poorly - noted "in the absence of anginal symptoms and normal echo - no further ischemic evaluation was planned".  admitted again in March with "weakness". MRI of the brain with numerous small acute infarcts. CT of the chest .  She also noted left-sided weakness and difficulty with speech and difficulty with memory.  Some of this has returned at this point, her husband notes that she still has trouble with her memory    Diagnosis TRANSBRONCHIAL NEEDLE ASPIRATION, (B), NAVIGATION, LEFT LOWER LOBE (SPECIMEN 2 OF 7 COLLECTED 08-03-2017) ATYPICAL CELLS PRESENT, SEE COMMENT. Preliminary Diagnosis DX: ATYPICAL CELLS PRESENT ([facility]) Jaquita Folds MD  Not diagnostic   Current Activity/ Functional  Status:  Patient is independent with mobility/ambulation, transfers, ADL's, IADL's.   Zubrod Score: At the time of surgery this patient's most appropriate activity status/level should be described as: []     0    Normal activity, no symptoms []     1    Restricted in physical strenuous activity but ambulatory, able to do out light work [x]     2    Ambulatory and capable of self care, unable to do work activities, up and about               >50 % of waking hours                              []     3    Only limited self care, in bed greater than 50% of waking hours []     4    Completely disabled, no self care, confined to bed or chair []     5    Moribund   Past Medical History:  Diagnosis Date  . Anxiety    pt denies  . Arthritis    back, legs  . CAD (coronary artery disease) 2008   s/p Taxus Element Perseus Study stent (DES) to OM1 in 08/2006; EF 65%  . HTN (hypertension)   . Hypercholesterolemia   .  Ischemic stroke ([facility])   . Mass of lower lobe of left lung 08/2017  . Pneumonia 5/16   hospitalized for 4 days    Past Surgical History:  Procedure Laterality Date  . APPENDECTOMY  1950  . BRONCHOSCOPY  08/03/2017   BIOPSY  . CORONARY ANGIOPLASTY WITH STENT PLACEMENT    . FUDUCIAL PLACEMENT Left 08/03/2017   Procedure: PLACEMENT OF FUDUCIAL;  Surgeon: Collene Gobble, MD;  Location: [facility];  Service: Thoracic;  Laterality: Left;  . GANGLION CYST EXCISION Right 12/26/2014   Procedure: EXCISON OF RIGHT FOOT GANGLION CYST;  Surgeon: Wylene Simmer, MD;  Location: [facility];  Service: Orthopedics;  Laterality: Right;  . lower back surgery  1976  . NECK SURGERY  1990  . PARTIAL HYSTERECTOMY  1977  . PCI of hte lesion in th marginal branch circumflex artery    . TEE WITHOUT CARDIOVERSION N/A 06/21/2017   Procedure: TRANSESOPHAGEAL ECHOCARDIOGRAM (TEE);  Surgeon: Acie Fredrickson Wonda Cheng, MD;  Location: [facility] ENDOSCOPY;  Service: Cardiovascular;  Laterality: N/A;  . VIDEO BRONCHOSCOPY  WITH ENDOBRONCHIAL NAVIGATION N/A 08/03/2017   Procedure: VIDEO BRONCHOSCOPY WITH ENDOBRONCHIAL NAVIGATION;  Surgeon: Collene Gobble, MD;  Location: [facility] OR;  Service: Thoracic;  Laterality: N/A;    Family History  Problem Relation Age of Onset  . Heart failure Mother   . Hypertension Mother      Social History   Tobacco Use  Smoking Status Former Smoker  . Packs/day: 1.00  . Years: 40.00  . Pack years: 40.00  . Types: Cigarettes  . Last attempt to quit: 2008  . Years since quitting: 11.3  Smokeless Tobacco Never Used    Social History   Substance and Sexual Activity  Alcohol Use No     Allergies  Allergen Reactions  . Other Anaphylaxis    Food or preservative allergy of unknown/undetermined origin (happened last in 2017 when the patient had gone out to eat and hadn't yet touched her steak??)    Current Outpatient Medications  Medication Sig Dispense Refill  . aspirin 81 MG tablet Take 1 tablet (81 mg total) by mouth 3 (three) times a week. 30 tablet   . atorvastatin (LIPITOR) 20 MG tablet Take 1 tablet (20 mg total) by mouth daily at 6 PM. 30 tablet 0  . EPINEPHrine (EPIPEN 2-PAK) 0.3 mg/0.3 mL IJ SOAJ injection Inject 0.3 mg into the muscle once as needed (for anaphylaxis).    . felodipine (PLENDIL) 10 MG 24 hr tablet Take 10 mg by mouth daily.      . Multiple Vitamin (MULTIVITAMIN WITH MINERALS) TABS tablet Take 1 tablet by mouth daily. (Patient taking differently: Take 1 tablet by mouth 2 (two) times a week. )    . nitroGLYCERIN (NITROSTAT) 0.4 MG SL tablet Place 1 tablet (0.4 mg total) under the tongue every 5 (five) minutes as needed for chest pain. 25 tablet 3   No current facility-administered medications for this visit.     Pertinent items are noted in HPI.   Review of Systems:     Cardiac Review of Systems: [Y] = yes  or   [ N ] = no   Chest Pain [ n  ]  Resting SOB [ y  ] Exertional SOB  [ y ]  Orthopnea [ y ]   Pedal Edema [n   ]    Palpitations [n   ] Syncope  Florencio.Farrier  ]   Presyncope [ n  ]   General Review  of Systems: [Y] = yes [  ]=no Constitional: recent weight change [ y ];  Wt loss over the last 3 months [   ] anorexia [  ]; fatigue [ y]; nausea [  ]; night sweats [  ]; fever [  ]; or chills [  ];           Eye : blurred vision [  ]; diplopia [   ]; vision changes [  ];  Amaurosis fugax[  ]; Resp: cough [  ];  wheezing[  ];  hemoptysis[  ]; shortness of breath[ y ]; paroxysmal nocturnal dyspnea[  ]; dyspnea on exertion[ y ]; or orthopnea[  ];  GI:  gallstones[  ], vomiting[  ];  dysphagia[  ]; melena[  ];  hematochezia [  ]; heartburn[  ];   Hx of  Colonoscopy[  ]; GU: kidney stones [  ]; hematuria[  ];   dysuria [  ];  nocturia[  ];  history of     obstruction [  ]; urinary frequency [  ]             Skin: rash, swelling[  ];, hair loss[  ];  peripheral edema[  ];  or itching[  ]; Musculosketetal: myalgias[  ];  joint swelling[  ];  joint erythema[  ];  joint pain[  ];  back pain[  ];  Heme/Lymph: bruising[  ];  bleeding[  ];  anemia[  ];  Neuro: TIA[  ];  headaches[  ];  stroke[ y ];  vertigo[  ];  seizures[  ];   paresthesias[  ];  difficulty walking[ y ];  Psych:depression[  ]; anxiety[  ];  Endocrine: diabetes[  ];  thyroid dysfunction[  ];  Immunizations: Flu up to date Blue.Reese  ]; Pneumococcal up to date Blue.Reese  ];  Other:     PHYSICAL EXAMINATION: BP 124/60 (BP Location: Right Arm, Patient Position: Sitting, Cuff Size: Normal)   Pulse 73   Resp 16   Ht 5' (1.524 m)   Wt 103 lb (46.7 kg)   SpO2 92% Comment: RA  BMI 20.12 kg/m  General appearance: alert, cooperative and slowed mentation Head: Normocephalic, without obvious abnormality, atraumatic Neck: no adenopathy, no carotid bruit, no JVD, supple, symmetrical, trachea midline and thyroid not enlarged, symmetric, no tenderness/mass/nodules Lymph nodes: Cervical, supraclavicular, and axillary nodes normal. Resp: clear to auscultation bilaterally Back: symmetric, no curvature. ROM  normal. No CVA tenderness. Cardio: regular rate and rhythm, S1, S2 normal, no murmur, click, rub or gallop GI: soft, non-tender; bowel sounds normal; no masses,  no organomegaly Extremities: extremities normal, atraumatic, no cyanosis or edema and Homans sign is negative, no sign of DVT Neurologic: Grossly normal  Diagnostic Studies & Laboratory data:     Recent Radiology Findings:   Dg Chest 2 View  Result Date: 08/13/2017 CLINICAL DATA:  Unspecified pneumothorax, follow-up. Bronchoscopy 08/03/2016. EXAM: CHEST - 2 VIEW COMPARISON:  08/04/2017 FINDINGS: Stable small left apical pneumothorax, 5% or less. Pneumothorax at the base has been replaced by a small effusion. Known nodule at the left base with fiducial markers. Hyperinflation. Normal heart size. IMPRESSION: Small left apical pneumothorax, less than 5%, without progression since exam 8 days ago. Pneumothorax at the left base on prior has been replaced by pleural fluid. Electronically Signed   By: Monte Fantasia M.D.   On: 08/13/2017 08:48   Nm Pet Image Initial (pi) Skull Base To Thigh  Result Date: 07/22/2017 CLINICAL DATA:  Initial  treatment strategy for lung nodule. EXAM: NUCLEAR MEDICINE PET SKULL BASE TO THIGH TECHNIQUE: 5.19 mCi F-18 FDG was injected intravenously. Full-ring PET imaging was performed from the skull base to thigh after the radiotracer. CT data was obtained and used for attenuation correction and anatomic localization. Fasting blood glucose: 88 mg/dl COMPARISON:  None FINDINGS: Mediastinal blood pool activity: SUV max 2.07 NECK: No hypermetabolic lymph nodes in the neck. Incidental CT findings: none CHEST: No hypermetabolic mediastinal or hilar lymph nodes. No hypermetabolic axillary or supraclavicular lymph nodes. Nodule within the anterior left lower lobe abutting the oblique fissure measures 2.6 cm and has an SUV max equal to 13.02. No additional hypermetabolic nodules identified. Tiny right upper lobe lung nodule  measures 2 mm, image 30/8. Too small to characterize by PET-CT. Incidental CT findings: Heart size is normal. There is aortic atherosclerosis. Calcifications in the RCA, LAD and left circumflex coronary arteries noted. Moderate changes of emphysema. ABDOMEN/PELVIS: No abnormal hypermetabolic activity within the liver, pancreas, adrenal glands, or spleen. No hypermetabolic lymph nodes in the abdomen or pelvis. Incidental CT findings: Calcified granulomas noted within the liver. Aortic atherosclerosis noted. 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 No abdominal aortic aneurysm. Small stones noted within the gallbladder measure up to 5 mm. SKELETON: No focal hypermetabolic activity to suggest skeletal metastasis. Incidental CT findings: Scoliosis and degenerative disc disease identified. IMPRESSION: 1. Left lower lobe perifissural lung mass exhibits intense FDG uptake compatible with primary bronchogenic carcinoma. There is no hypermetabolic hilar or mediastinal adenopathy and no evidence for distant metastatic disease. 2. Aortic Atherosclerosis (ICD10-I70.0) and Emphysema (ICD10-J43.9). 3. Three vessel coronary artery calcifications. 4. 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 5. Gallstones. Electronically Signed   By: Kerby Moors M.D.   On: 07/22/2017 09:19     Ct Super D Chest Wo Contrast  Result Date: 07/29/2017 CLINICAL DATA:  Left lung mass. EXAM: CT CHEST WITHOUT CONTRAST TECHNIQUE: Multidetector CT imaging of the chest was performed using thin slice collimation for electromagnetic bronchoscopy planning purposes, without intravenous contrast. COMPARISON:  06/18/2017 FINDINGS: Cardiovascular: Heart size within normal limits. Trace pericardial effusion again noted. Coronary artery calcification is evident. Atherosclerotic calcification is noted in the wall of the thoracic aorta. Mediastinum/Nodes: No mediastinal lymphadenopathy. No evidence for gross hilar lymphadenopathy although assessment is  limited by the lack of intravenous contrast on today's study. There is no axillary lymphadenopathy. The esophagus has normal imaging features. Lungs/Pleura: The central tracheobronchial airways are patent. Centrilobular and paraseptal emphysema again noted. Calcified granuloma again noted right upper lobe with tiny noncalcified anterior right upper lobe pulmonary nodule identified on 42/3. Other scattered tiny calcified and noncalcified pulmonary nodules are similar. Left lower lobe lesion along the major fissure is similar, measuring 2.0 cm in the same dimension it was previously measured at 2.0 cm. Upper Abdomen: Heterogeneous small spleen as before. Infarcts noted on the previous exam are less prominent on today's noncontrast study. Musculoskeletal: Superior endplate compression fracture at L1 is similar to prior. Compression deformity at T6 is unchanged. Mild superior endplate compression at T2 and T3 is also stable. IMPRESSION: 1. Stable appearance of the left lower lobe pulmonary lesion, highly suspicious for primary bronchogenic neoplasm. 2. Scattered tiny bilateral calcified and noncalcified pulmonary nodules, nonspecific. 3.  Emphysema. (ICD10-J43.9) 4.  Aortic Atherosclerois (ICD10-170.0) 5. Multilevel compression fractures in the thoracolumbar spine. Electronically Signed   By: Misty Stanley M.D.   On: 07/29/2017 09:00   Dg C-arm Bronchoscopy  Result Date: 08/03/2017 C-ARM BRONCHOSCOPY: Fluoroscopy  was utilized by the requesting physician.  No radiographic interpretation.     I have independently reviewed the above radiology studies  and reviewed the findings with the patient.   Recent Lab Findings: Lab Results  Component Value Date   WBC 10.2 08/03/2017   HGB 11.6 (L) 08/03/2017   HCT 37.1 08/03/2017   PLT 344 08/03/2017   GLUCOSE 92 08/03/2017   CHOL 162 06/19/2017   TRIG 101 06/19/2017   HDL 46 06/19/2017   LDLDIRECT 157.3 01/06/2011   LDLCALC 96 06/19/2017   ALT 12 (L) 06/20/2017    AST 20 06/20/2017   NA 142 08/03/2017   K 3.8 08/03/2017   CL 107 08/03/2017   CREATININE 0.85 08/03/2017   BUN 8 08/03/2017   CO2 26 08/03/2017   TSH 1.441 06/20/2017   INR 1.04 06/21/2017   HGBA1C 5.4 06/18/2017    PFT's  08/11/2017 no formal report FEV1 0.85 49 % DLCO 7.9 36%  Assessment / Plan:   Left lower lobe lung mass suspicious radiographically for malignancy, atypical cells but not diagnostic on recent bronchoscopy by the pulmonary service.-  Small left pneumothorax secondary to biopsy done-follow-up chest x-ray done on 511 small left effusion Severe underlying pulmonary disease-with moderately severe obstructive disease and severe diffusion and impairment on formal PFTs History of recent stroke Known coronary occlusive disease    With the patient's recent positive troponins, frail status, recent stroke, moderately severe obstructive disease with FEV1 of 0.8 and severe diffusion capacity she is not an operative candidate for pulmonary resection.  She was discussed at the multidisciplinary thoracic oncology conference.  She could be considered for radiotherapy stereotactic to the left lung, but the consensus was it would be preferred to have a tissue diagnosis.  I discussed CT-guided needle biopsy with the patient since the previous navigation bronchoscopy was unsuccessful obtaining a tissue diagnosis.   She has an appointment to see Dr. Lisbeth Renshaw on May 22  Order has been placed for CT-guided needle biopsy of the left lung mass.  Information is been given to the patient and her husband concerning lung nodules and lung cancer staging   I  spent 45 minutes with  the patient face to face and greater then 50% of the time was spent in counseling and coordination of care.    Grace Isaac MD      [address] [place] [address] Office [phone]   Beeper [phone]  08/18/2017 9:52 AM

## 2017-08-18 NOTE — Telephone Encounter (Signed)
RB please advise, cardiology is requesting interpretation of PFT.

## 2017-08-19 NOTE — Telephone Encounter (Signed)
It got assigned to Dr. Lamonte Sakai by mistake. It has been re routed to me and read. Results are in [facility]

## 2017-08-23 NOTE — Progress Notes (Signed)
Thoracic Location of Tumor / Histology: Lower Lobe of left lung  Patient presented for stroke like symptoms in March.  Incidental finding of the left lower lobe lung mass.  She had a navigational biopsy done on 5/1, postop had a small left pneumothorax that did not require chest tube, kept overnight for observation.  CT Chest 06/18/2017: left lower lobe mass measuring 3 cm, multiple bilateral 2-3 mm lung nodules.  No mediastinal or hilar lymphadenopathy.  CT Abdomen 06/19/2017: no evidence of metastatic disease.  PET 07/21/2017: 1. Left lower lobe perifissural lung mass exhibits intense FDG uptake compatible with primary bronchogenic carcinoma. There is no hypermetabolic hilar or mediastinal adenopathy and no evidence for distant metastatic disease. 2. Aortic Atherosclerosis and Emphysema. 3. Three vessel coronary artery calcifications. 4. 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  CT Chest 07/29/2017: 1. Stable appearance of the left lower lobe pulmonary lesion, highly suspicious for primary bronchogenic neoplasm. 2. Scattered tiny bilateral calcified and noncalcified pulmonary nodules, nonspecific.3. Emphysema. 4. Aortic Atherosclerosis. 5. Multilevel compression fractures in the thoracolumbar spine.   Chest Xray 08/13/2017: Small left apical pneumothorax, less than 5%, without progression since exam 8 days ago. Pneumothorax at the left base on prior has been replaced by pleural fluid.   Biopsies of Left lower lobe 08/03/2017    Tobacco/Marijuana/Snuff/ETOH use:  Past/Anticipated interventions by cardiothoracic surgery, if any:  Dr. Servando Snare 08/18/2017 -With the patient's recent positive troponins, frail status, recent stroke, moderately severe obstructive disease with FEV1 of 0.8 and severe diffusion capacity she is not an operative candidate for pulmonary resection -She could be considered for radiotherapy stereotactic to the left lung, but the consensus was it would be preferred to have a tissue  diagnosis.  I discussed CT-guided needle biopsy with the patient since the previous navigation bronchoscopy was unsuccessful obtaining a tissue diagnosis.   Past/ Anticipated interventions by Pulmonology, if any: Dr. Vaughan Browner 08/12/2017 Left lower lobe mass -Suspicious for primary lung malignancy.  No evidence of metastasis or lymphadenopathy on PET scan -Underwent navigational biopsy of the left lower lobe lung mass with the results showing atypical cells -Discussed at multidisciplinary tumor board yesterday and recommendation was to do a repeat biopsy. -I had a discussion with the patient and husband today regarding this.  They are reluctant to undergo the procedure again and want some more time to think about it.  If the patient does not want repeat procedure done then we may have to consider radiation even without the path diagnosis.   Past/Anticipated interventions by medical oncology, if any:   Signs/Symptoms  Weight changes, if any:  Lost about 10 pounds since stroke in February/ March.  Respiratory complaints, if any: No  Hemoptysis, if any: No cough, no blood noted.  Pain issues, if any:  No   BP (!) 111/93 (BP Location: Left Arm, Patient Position: Sitting, Cuff Size: Normal)   Pulse 70   Temp 98.3 F (36.8 C) (Oral)   Resp 20   Ht 5\' 1"  (1.549 m)   Wt 106 lb (48.1 kg)   SpO2 100%   BMI 20.03 kg/m    Wt Readings from Last 3 Encounters:  08/24/17 106 lb (48.1 kg)  08/24/17 106 lb (48.1 kg)  08/18/17 103 lb (46.7 kg)   SAFETY ISSUES:  Prior radiation? No  Pacemaker/ICD? No  Possible current pregnancy? No  Is the patient on methotrexate? No   Current Complaints / other details:

## 2017-08-24 ENCOUNTER — Ambulatory Visit
Admission: RE | Admit: 2017-08-24 | Discharge: 2017-08-24 | Disposition: A | Discharge status: Home / Self Care | Payer: Medicare Other | Source: Ambulatory Visit | Attending: Radiation Oncology | Admitting: Radiation Oncology

## 2017-08-24 ENCOUNTER — Other Ambulatory Visit: Payer: Self-pay

## 2017-08-24 ENCOUNTER — Encounter: Payer: Self-pay | Admitting: Radiation Oncology

## 2017-08-24 VITALS — BP 111/93 | HR 70 | Temp 98.3°F | Resp 20 | Ht 61.0 in | Wt 106.0 lb

## 2017-08-24 VITALS — BP 111/93 | HR 70 | Temp 98.3°F | Resp 20 | Wt 106.0 lb

## 2017-08-24 DIAGNOSIS — F419 Anxiety disorder, unspecified: Secondary | ICD-10-CM | POA: Diagnosis not present

## 2017-08-24 DIAGNOSIS — Z8673 Personal history of transient ischemic attack (TIA), and cerebral infarction without residual deficits: Secondary | ICD-10-CM | POA: Insufficient documentation

## 2017-08-24 DIAGNOSIS — I700 Atherosclerosis of aorta: Secondary | ICD-10-CM | POA: Insufficient documentation

## 2017-08-24 DIAGNOSIS — I2510 Atherosclerotic heart disease of native coronary artery without angina pectoris: Secondary | ICD-10-CM | POA: Diagnosis not present

## 2017-08-24 DIAGNOSIS — R918 Other nonspecific abnormal finding of lung field: Secondary | ICD-10-CM

## 2017-08-24 DIAGNOSIS — J439 Emphysema, unspecified: Secondary | ICD-10-CM | POA: Insufficient documentation

## 2017-08-24 DIAGNOSIS — Z8781 Personal history of (healed) traumatic fracture: Secondary | ICD-10-CM | POA: Diagnosis not present

## 2017-08-24 DIAGNOSIS — Z87891 Personal history of nicotine dependence: Secondary | ICD-10-CM | POA: Insufficient documentation

## 2017-08-24 DIAGNOSIS — C3432 Malignant neoplasm of lower lobe, left bronchus or lung: Secondary | ICD-10-CM | POA: Diagnosis present

## 2017-08-24 DIAGNOSIS — Z7982 Long term (current) use of aspirin: Secondary | ICD-10-CM | POA: Diagnosis not present

## 2017-08-24 DIAGNOSIS — E7800 Pure hypercholesterolemia, unspecified: Secondary | ICD-10-CM | POA: Diagnosis not present

## 2017-08-24 DIAGNOSIS — I10 Essential (primary) hypertension: Secondary | ICD-10-CM | POA: Insufficient documentation

## 2017-08-24 DIAGNOSIS — Z79899 Other long term (current) drug therapy: Secondary | ICD-10-CM | POA: Insufficient documentation

## 2017-08-24 DIAGNOSIS — M129 Arthropathy, unspecified: Secondary | ICD-10-CM | POA: Insufficient documentation

## 2017-08-24 NOTE — Progress Notes (Signed)
Radiation Oncology         [phone] ________________________________  Name: [name] [name]        MRN: [id number]  Date of Service: 08/24/2017 DOB: 1934-04-13  BD:[id number], Curt Jews, MD  Curt Bears, MD     REFERRING PHYSICIAN: Curt Bears, MD   DIAGNOSIS: The encounter diagnosis was Mass of lower lobe of left lung.   HISTORY OF PRESENT ILLNESS: [name] [name] is a 83 y.o. female seen at the request of Dr. Servando Snare for a putative stage I lung cancer. The patient had a recent ischemic CVA in March 2019, and during her work up a CT chest with contrast on 06/18/17 revealed a 3 x 2.2 x 2 cm mass in the elft lower lobe with spiculated margins and is in contact with the major fissure. She underwent a PET scan on 07/21/17 which measured this lesion in the LLL at 2.6 cm with an SUV of 13.2 wtihout additional nodules being active that were also seen nearby. She does not have any additional concerns for disease. She did have a brain MRI as well in the process of her work up that was negative for disease but further characterized her infarcts. She underwent navigational bronchoscopy with fiducial marker placement on 08/03/17 and her specimen was read as atypical cells but non diagnostic for malignancy. She is scheduled for CT biopsy on 08/31/17 and comes today to discuss options of treatment. She is not a surgical candidate at this time.    PREVIOUS RADIATION THERAPY: No   PAST MEDICAL HISTORY:  Past Medical History:  Diagnosis Date  . Anxiety    pt denies  . Arthritis    back, legs  . CAD (coronary artery disease) 2008   s/p Taxus Element Perseus Study stent (DES) to OM1 in 08/2006; EF 65%  . HTN (hypertension)   . Hypercholesterolemia   . Ischemic stroke ([place])   . Mass of lower lobe of left lung 08/2017  . Pneumonia 5/16   hospitalized for 4 days       PAST SURGICAL HISTORY: Past Surgical History:  Procedure Laterality Date  . APPENDECTOMY  1950  . BRONCHOSCOPY  08/03/2017     BIOPSY  . CORONARY ANGIOPLASTY WITH STENT PLACEMENT    . FUDUCIAL PLACEMENT Left 08/03/2017   Procedure: PLACEMENT OF FUDUCIAL;  Surgeon: Collene Gobble, MD;  Location: [place];  Service: Thoracic;  Laterality: Left;  . GANGLION CYST EXCISION Right 12/26/2014   Procedure: EXCISON OF RIGHT FOOT GANGLION CYST;  Surgeon: Wylene Simmer, MD;  Location: [place];  Service: Orthopedics;  Laterality: Right;  . lower back surgery  1976  . NECK SURGERY  1990  . PARTIAL HYSTERECTOMY  1977  . PCI of hte lesion in th marginal branch circumflex artery    . TEE WITHOUT CARDIOVERSION N/A 06/21/2017   Procedure: TRANSESOPHAGEAL ECHOCARDIOGRAM (TEE);  Surgeon: Acie Fredrickson Wonda Cheng, MD;  Location[facility] ENDOSCOPY;  Service: Cardiovascular;  Laterality: N/A;  . VIDEO BRONCHOSCOPY WITH ENDOBRONCHIAL NAVIGATION N/A 08/03/2017   Procedure: VIDEO BRONCHOSCOPY WITH ENDOBRONCHIAL NAVIGATION;  Surgeon: Collene Gobble, MD;  Location: [facility] OR;  Service: Thoracic;  Laterality: N/A;     FAMILY HISTORY:  Family History  Problem Relation Age of Onset  . Heart failure Mother   . Hypertension Mother      SOCIAL HISTORY:  reports that she quit smoking about 11 years ago. Her smoking use included cigarettes. She has a 40.00 pack-year smoking history. She has never used smokeless  tobacco. She reports that she does not drink alcohol or use drugs.   ALLERGIES: Other   MEDICATIONS:  Current Outpatient Medications  Medication Sig Dispense Refill  . aspirin 81 MG tablet Take 1 tablet (81 mg total) by mouth 3 (three) times a week. 30 tablet   . EPINEPHrine (EPIPEN 2-PAK) 0.3 mg/0.3 mL IJ SOAJ injection Inject 0.3 mg into the muscle once as needed (for anaphylaxis).    . felodipine (PLENDIL) 10 MG 24 hr tablet Take 10 mg by mouth daily.      . Multiple Vitamin (MULTIVITAMIN WITH MINERALS) TABS tablet Take 1 tablet by mouth daily. (Patient taking differently: Take 1 tablet by mouth 3 (three) times a week. )    .  atorvastatin (LIPITOR) 20 MG tablet Take 1 tablet (20 mg total) by mouth daily at 6 PM. (Patient not taking: Reported on 08/23/2017) 30 tablet 0  . nitroGLYCERIN (NITROSTAT) 0.4 MG SL tablet Place 1 tablet (0.4 mg total) under the tongue every 5 (five) minutes as needed for chest pain. (Patient not taking: Reported on 08/24/2017) 25 tablet 3   No current facility-administered medications for this encounter.      REVIEW OF SYSTEMS: On review of systems, the patient reports that she is doing well overall. She denies any chest pain, shortness of breath, cough, fevers, chills, night sweats, unintended weight changes. She denies any bowel or bladder disturbances, and denies abdominal pain, nausea or vomiting. She denies any new musculoskeletal or joint aches or pains. A complete review of systems is obtained and is otherwise negative.     PHYSICAL EXAM:  Wt Readings from Last 3 Encounters:  08/24/17 106 lb (48.1 kg)  08/24/17 106 lb (48.1 kg)  08/18/17 103 lb (46.7 kg)   Temp Readings from Last 3 Encounters:  08/24/17 98.3 F (36.8 C) (Oral)  08/24/17 98.3 F (36.8 C) (Oral)  08/04/17 98.3 F (36.8 C) (Oral)   BP Readings from Last 3 Encounters:  08/24/17 (!) 111/93  08/24/17 (!) 111/93  08/18/17 124/60   Pulse Readings from Last 3 Encounters:  08/24/17 70  08/24/17 70  08/18/17 73   Pain Assessment Pain Score: 0-No pain/10  In general this is a well appearing caucasian female in no acute distress. She is alert and oriented x4 and appropriate throughout the examination. HEENT reveals that the patient is normocephalic, atraumatic. EOMs are intact. PERRLA. Skin is intact without any evidence of gross lesions. Cardiovascular exam reveals a regular rate and rhythm, no clicks rubs or murmurs are auscultated. Chest is clear to auscultation bilaterally. Lymphatic assessment is performed and does not reveal any adenopathy in the cervical, supraclavicular, axillary, or inguinal chains. Abdomen  has active bowel sounds in all quadrants and is intact. The abdomen is soft, non tender, non distended. Lower extremities are negative for pretibial pitting edema, deep calf tenderness, cyanosis or clubbing.   ECOG = 0  0 - Asymptomatic (Fully active, able to carry on all predisease activities without restriction)  1 - Symptomatic but completely ambulatory (Restricted in physically strenuous activity but ambulatory and able to carry out work of a light or sedentary nature. For example, light housework, office work)  2 - Symptomatic, <50% in bed during the day (Ambulatory and capable of all self care but unable to carry out any work activities. Up and about more than 50% of waking hours)  3 - Symptomatic, >50% in bed, but not bedbound (Capable of only limited self-care, confined to bed or chair 50% or  more of waking hours)  4 - Bedbound (Completely disabled. Cannot carry on any self-care. Totally confined to bed or chair)  5 - Death   Eustace Pen MM, Creech RH, Tormey DC, et al. [phone]). "Toxicity and response criteria of the [facility]". [place] Oncol. 5 (6): 649-55    LABORATORY DATA:  Lab Results  Component Value Date   WBC 10.2 08/03/2017   HGB 11.6 (L) 08/03/2017   HCT 37.1 08/03/2017   MCV 87.3 08/03/2017   PLT 344 08/03/2017   Lab Results  Component Value Date   NA 142 08/03/2017   K 3.8 08/03/2017   CL 107 08/03/2017   CO2 26 08/03/2017   Lab Results  Component Value Date   ALT 12 (L) 06/20/2017   AST 20 06/20/2017   ALKPHOS 56 06/20/2017   BILITOT 0.4 06/20/2017      RADIOGRAPHY: Dg Chest 2 View  Result Date: 08/13/2017 CLINICAL DATA:  Unspecified pneumothorax, follow-up. Bronchoscopy 08/03/2016. EXAM: CHEST - 2 VIEW COMPARISON:  08/04/2017 FINDINGS: Stable small left apical pneumothorax, 5% or less. Pneumothorax at the base has been replaced by a small effusion. Known nodule at the left base with fiducial markers. Hyperinflation. Normal  heart size. IMPRESSION: Small left apical pneumothorax, less than 5%, without progression since exam 8 days ago. Pneumothorax at the left base on prior has been replaced by pleural fluid. Electronically Signed   By: Monte Fantasia M.D.   On: 08/13/2017 08:48   Dg Chest Port 1 View  Result Date: 08/04/2017 CLINICAL DATA:  Pneumothorax after biopsy EXAM: PORTABLE CHEST 1 VIEW COMPARISON:  08/03/2017 FINDINGS: Left basilar and apical pneumothorax again noted, unchanged. Left base atelectasis. Right lung clear. Heart is normal size. IMPRESSION: Stable left apical and basilar pneumothorax. Electronically Signed   By: Rolm Baptise M.D.   On: 08/04/2017 09:22   Dg Chest Port 1 View  Result Date: 08/03/2017 CLINICAL DATA:  Pneumothorax. Status post bronchoscopy with left lower lobe biopsy. EXAM: PORTABLE CHEST 1 VIEW COMPARISON:  Earlier the same day FINDINGS: 1844 hours. Lungs are hyperexpanded. Interstitial markings are diffusely coarsened with chronic features. Left-sided pneumothorax is not substantially in the interval. Left lower lobe lesion again identified with fiducial markers in situ. The cardiopericardial silhouette is within normal limits for size. The visualized bony structures of the thorax are intact. IMPRESSION: Stable appearance of the previously documented left pneumothorax. Electronically Signed   By: Misty Stanley M.D.   On: 08/03/2017 19:21   Dg Chest Port 1 View  Result Date: 08/03/2017 CLINICAL DATA:  Left pneumothorax after biopsy. EXAM: PORTABLE CHEST 1 VIEW COMPARISON:  Radiograph of same day. FINDINGS: The heart size and mediastinal contours are within normal limits. Atherosclerosis of thoracic aorta is noted. Right lung is clear. Stable mild left apical and basilar pneumothorax is noted compared to prior exam. The visualized skeletal structures are unremarkable. IMPRESSION: Stable mild left apical and basilar pneumothorax compared to prior exam. Electronically Signed   By: Marijo Conception, M.D.   On: 08/03/2017 16:26   Dg Chest Port 1 View  Result Date: 08/03/2017 CLINICAL DATA:  Status post bronchoscopy EXAM: PORTABLE CHEST 1 VIEW COMPARISON:  07/28/2017 FINDINGS: There are changes consistent with previous bronchoscopy with fiducial markers surrounding the patient's known left lower lobe mass. There is a mild pneumothorax identified with approximately 1 cm excursion along the apex as well as laterally in the left lung base. No other focal abnormality is seen. IMPRESSION: Left-sided pneumothorax  with approximately 1 cm excursion as described. Critical Value/emergent results were called by telephone at the time of interpretation on 08/03/2017 at 1:20 pm to Dr. Baltazar Apo , who verbally acknowledged these results. Electronically Signed   By: Inez Catalina M.D.   On: 08/03/2017 13:22   Ct Super D Chest Wo Contrast  Result Date: 07/29/2017 CLINICAL DATA:  Left lung mass. EXAM: CT CHEST WITHOUT CONTRAST TECHNIQUE: Multidetector CT imaging of the chest was performed using thin slice collimation for electromagnetic bronchoscopy planning purposes, without intravenous contrast. COMPARISON:  06/18/2017 FINDINGS: Cardiovascular: Heart size within normal limits. Trace pericardial effusion again noted. Coronary artery calcification is evident. Atherosclerotic calcification is noted in the wall of the thoracic aorta. Mediastinum/Nodes: No mediastinal lymphadenopathy. No evidence for gross hilar lymphadenopathy although assessment is limited by the lack of intravenous contrast on today's study. There is no axillary lymphadenopathy. The esophagus has normal imaging features. Lungs/Pleura: The central tracheobronchial airways are patent. Centrilobular and paraseptal emphysema again noted. Calcified granuloma again noted right upper lobe with tiny noncalcified anterior right upper lobe pulmonary nodule identified on 42/3. Other scattered tiny calcified and noncalcified pulmonary nodules are similar. Left  lower lobe lesion along the major fissure is similar, measuring 2.0 cm in the same dimension it was previously measured at 2.0 cm. Upper Abdomen: Heterogeneous small spleen as before. Infarcts noted on the previous exam are less prominent on today's noncontrast study. Musculoskeletal: Superior endplate compression fracture at L1 is similar to prior. Compression deformity at T6 is unchanged. Mild superior endplate compression at T2 and T3 is also stable. IMPRESSION: 1. Stable appearance of the left lower lobe pulmonary lesion, highly suspicious for primary bronchogenic neoplasm. 2. Scattered tiny bilateral calcified and noncalcified pulmonary nodules, nonspecific. 3.  Emphysema. (ICD10-J43.9) 4.  Aortic Atherosclerois (ICD10-170.0) 5. Multilevel compression fractures in the thoracolumbar spine. Electronically Signed   By: Misty Stanley M.D.   On: 07/29/2017 09:00   Dg C-arm Bronchoscopy  Result Date: 08/03/2017 C-ARM BRONCHOSCOPY: Fluoroscopy was utilized by the requesting physician.  No radiographic interpretation.       IMPRESSION/PLAN: 1. Putative Stage IA, [id number] NSCLC of the LLL. Dr. Lisbeth Renshaw discusses the pathology findings and reviews the nature of early stage lung cancer. He outlines that in patients who are not surgical candidates, could proceed with stereotactic body radiotherapy (SBRT). He would like to wait to proceed until we had confirmation with the upcoming biopsy. We discussed the risks, benefits, short, and long term effects of radiotherapy, and the patient is interested in proceeding. Dr. Lisbeth Renshaw discusses the delivery and logistics of radiotherapy and anticipates a course of 5 fractions due to the proximity of the heart.  We will contact her with her biopsy results and coordinate simulation once we know this. Written consent is obtained and placed in the chart, a copy was provided to the patient.    The above documentation reflects my direct findings during this shared patient visit. Please  see the separate note by Dr. Lisbeth Renshaw on this date for the remainder of the patient's plan of care.    Carola Rhine, PAC

## 2017-08-30 ENCOUNTER — Ambulatory Visit: Payer: Medicare Other | Admitting: Nurse Practitioner

## 2017-08-30 ENCOUNTER — Other Ambulatory Visit: Payer: Self-pay | Admitting: Radiology

## 2017-08-31 ENCOUNTER — Encounter (HOSPITAL_COMMUNITY): Payer: Self-pay

## 2017-08-31 ENCOUNTER — Ambulatory Visit (HOSPITAL_COMMUNITY)
Admission: RE | Admit: 2017-08-31 | Discharge: 2017-08-31 | Disposition: A | Discharge status: Home / Self Care | Payer: Medicare Other | Source: Ambulatory Visit | Attending: Cardiothoracic Surgery | Admitting: Cardiothoracic Surgery

## 2017-08-31 ENCOUNTER — Ambulatory Visit (HOSPITAL_COMMUNITY)
Admission: RE | Admit: 2017-08-31 | Discharge: 2017-08-31 | Disposition: A | Discharge status: Home / Self Care | Payer: Medicare Other | Source: Ambulatory Visit | Attending: Interventional Radiology | Admitting: Interventional Radiology

## 2017-08-31 DIAGNOSIS — I2510 Atherosclerotic heart disease of native coronary artery without angina pectoris: Secondary | ICD-10-CM | POA: Diagnosis not present

## 2017-08-31 DIAGNOSIS — C3432 Malignant neoplasm of lower lobe, left bronchus or lung: Secondary | ICD-10-CM | POA: Diagnosis not present

## 2017-08-31 DIAGNOSIS — Z79899 Other long term (current) drug therapy: Secondary | ICD-10-CM | POA: Diagnosis not present

## 2017-08-31 DIAGNOSIS — Z8249 Family history of ischemic heart disease and other diseases of the circulatory system: Secondary | ICD-10-CM | POA: Diagnosis not present

## 2017-08-31 DIAGNOSIS — I10 Essential (primary) hypertension: Secondary | ICD-10-CM | POA: Insufficient documentation

## 2017-08-31 DIAGNOSIS — Z87892 Personal history of anaphylaxis: Secondary | ICD-10-CM | POA: Insufficient documentation

## 2017-08-31 DIAGNOSIS — Z8673 Personal history of transient ischemic attack (TIA), and cerebral infarction without residual deficits: Secondary | ICD-10-CM | POA: Diagnosis not present

## 2017-08-31 DIAGNOSIS — E7800 Pure hypercholesterolemia, unspecified: Secondary | ICD-10-CM | POA: Insufficient documentation

## 2017-08-31 DIAGNOSIS — Z9889 Other specified postprocedural states: Secondary | ICD-10-CM | POA: Diagnosis not present

## 2017-08-31 DIAGNOSIS — Z87891 Personal history of nicotine dependence: Secondary | ICD-10-CM | POA: Insufficient documentation

## 2017-08-31 DIAGNOSIS — R911 Solitary pulmonary nodule: Secondary | ICD-10-CM | POA: Diagnosis present

## 2017-08-31 DIAGNOSIS — Z7982 Long term (current) use of aspirin: Secondary | ICD-10-CM | POA: Insufficient documentation

## 2017-08-31 DIAGNOSIS — R918 Other nonspecific abnormal finding of lung field: Secondary | ICD-10-CM

## 2017-08-31 DIAGNOSIS — Z955 Presence of coronary angioplasty implant and graft: Secondary | ICD-10-CM | POA: Diagnosis not present

## 2017-08-31 LAB — CBC
HCT: 40.2 % (ref 36.0–46.0)
Hemoglobin: 12.7 g/dL (ref 12.0–15.0)
MCH: 27.5 pg (ref 26.0–34.0)
MCHC: 31.6 g/dL (ref 30.0–36.0)
MCV: 87.2 fL (ref 78.0–100.0)
PLATELETS: 284 10*3/uL (ref 150–400)
RBC: 4.61 MIL/uL (ref 3.87–5.11)
RDW: 14.9 % (ref 11.5–15.5)
WBC: 10.1 10*3/uL (ref 4.0–10.5)

## 2017-08-31 LAB — APTT: aPTT: 32 seconds (ref 24–36)

## 2017-08-31 LAB — PROTIME-INR
INR: 1.1
Prothrombin Time: 14.1 seconds (ref 11.4–15.2)

## 2017-08-31 MED ORDER — SODIUM CHLORIDE 0.9 % IV SOLN
INTRAVENOUS | Status: AC | PRN
  Administered 2017-08-31: 10 mL/h via INTRAVENOUS

## 2017-08-31 MED ORDER — FENTANYL CITRATE (PF) 100 MCG/2ML IJ SOLN
INTRAMUSCULAR | Status: AC
  Filled 2017-08-31: qty 2

## 2017-08-31 MED ORDER — SODIUM CHLORIDE 0.9 % IV SOLN: INTRAVENOUS | Status: DC

## 2017-08-31 MED ORDER — FENTANYL CITRATE (PF) 100 MCG/2ML IJ SOLN
INTRAMUSCULAR | Status: AC | PRN
  Administered 2017-08-31: 25 ug via INTRAVENOUS

## 2017-08-31 MED ORDER — MIDAZOLAM HCL 2 MG/2ML IJ SOLN
INTRAMUSCULAR | Status: AC | PRN
  Administered 2017-08-31: 1 mg via INTRAVENOUS

## 2017-08-31 MED ORDER — LIDOCAINE HCL 1 % IJ SOLN
INTRAMUSCULAR | Status: AC
  Filled 2017-08-31: qty 20

## 2017-08-31 MED ORDER — MIDAZOLAM HCL 2 MG/2ML IJ SOLN
INTRAMUSCULAR | Status: AC
  Filled 2017-08-31: qty 2

## 2017-08-31 NOTE — H&P (Addendum)
Chief Complaint: Patient was seen in consultation today for lung mass  Referring Physician(s): Grace Isaac  Supervising Physician: Sandi Mariscal  Patient Status: [facility] - Out-pt  History of Present Illness: [name] [name] is a 83 y.o. female with past medical history of CAD, HTN, HLD, CVA who was recently found to have a left lower lobe lung mass.  Patient underwent bronchoscopy 08/03/17 which showed atypical cells but was not diganostic for malignancy.  Of note, she did develop a small pneumothorax after her bronchoscopy which did not require intervention.  CXR 5/11 showed <5% left pneumothorax remaining.  IR now consulted for percutaneous biopsy of the lung mass.  Case reviewed and approved by Dr. Pascal Lux.  She presents to radiology department today in her usual state of health.  She has been NPO.  She does not take blood thinners.   Past Medical History:  Diagnosis Date  . Anxiety    pt denies  . Arthritis    back, legs  . CAD (coronary artery disease) 2008   s/p Taxus Element Perseus Study stent (DES) to OM1 in 08/2006; EF 65%  . HTN (hypertension)   . Hypercholesterolemia   . Ischemic stroke ([place])   . Mass of lower lobe of left lung 08/2017  . Pneumonia 5/16   hospitalized for 4 days    Past Surgical History:  Procedure Laterality Date  . APPENDECTOMY  1950  . BRONCHOSCOPY  08/03/2017   BIOPSY  . CORONARY ANGIOPLASTY WITH STENT PLACEMENT    . FUDUCIAL PLACEMENT Left 08/03/2017   Procedure: PLACEMENT OF FUDUCIAL;  Surgeon: Collene Gobble, MD;  Location: [place];  Service: Thoracic;  Laterality: Left;  . GANGLION CYST EXCISION Right 12/26/2014   Procedure: EXCISON OF RIGHT FOOT GANGLION CYST;  Surgeon: Wylene Simmer, MD;  Location: [place];  Service: Orthopedics;  Laterality: Right;  . lower back surgery  1976  . NECK SURGERY  1990  . PARTIAL HYSTERECTOMY  1977  . PCI of hte lesion in th marginal branch circumflex artery    . TEE WITHOUT CARDIOVERSION  N/A 06/21/2017   Procedure: TRANSESOPHAGEAL ECHOCARDIOGRAM (TEE);  Surgeon: Acie Fredrickson Wonda Cheng, MD;  Location: [place];  Service: Cardiovascular;  Laterality: N/A;  . VIDEO BRONCHOSCOPY WITH ENDOBRONCHIAL NAVIGATION N/A 08/03/2017   Procedure: VIDEO BRONCHOSCOPY WITH ENDOBRONCHIAL NAVIGATION;  Surgeon: Collene Gobble, MD;  Location: [place];  Service: Thoracic;  Laterality: N/A;    Allergies: Other  Medications: Prior to Admission medications   Medication Sig Start Date End Date Taking? Authorizing Provider  aspirin 81 MG tablet Take 1 tablet (81 mg total) by mouth 3 (three) times a week. 08/05/17  Yes Ollis, Brandi L, NP  EPINEPHrine (EPIPEN 2-PAK) 0.3 mg/0.3 mL IJ SOAJ injection Inject 0.3 mg into the muscle once as needed (for anaphylaxis).   Yes [provider]  felodipine (PLENDIL) 10 MG 24 hr tablet Take 10 mg by mouth daily.     Yes [provider]  Multiple Vitamin (MULTIVITAMIN WITH MINERALS) TABS tablet Take 1 tablet by mouth daily. Patient taking differently: Take 1 tablet by mouth 3 (three) times a week.  05/11/17  Yes Georgette Shell, MD  nitroGLYCERIN (NITROSTAT) 0.4 MG SL tablet Place 1 tablet (0.4 mg total) under the tongue every 5 (five) minutes as needed for chest pain. Patient not taking: Reported on 08/24/2017 07/10/14  Yes Josue Hector, MD  atorvastatin (LIPITOR) 20 MG tablet Take 1 tablet (20 mg total) by mouth daily at  6 PM. Patient not taking: Reported on 08/23/2017 06/21/17   Aline August, MD     Family History  Problem Relation Age of Onset  . Heart failure Mother   . Hypertension Mother     Social History   Socioeconomic History  . Marital status: Married    Spouse name: Not on file  . Number of children: Not on file  . Years of education: Not on file  . Highest education level: Not on file  Occupational History  . Not on file  Social Needs  . Financial resource strain: Not on file  . Food insecurity:    Worry: Not on file     Inability: Not on file  . Transportation needs:    Medical: Not on file    Non-medical: Not on file  Tobacco Use  . Smoking status: Former Smoker    Packs/day: 1.00    Years: 40.00    Pack years: 40.00    Types: Cigarettes    Last attempt to quit: 2008    Years since quitting: 11.4  . Smokeless tobacco: Never Used  Substance and Sexual Activity  . Alcohol use: No  . Drug use: No  . Sexual activity: Not on file  Lifestyle  . Physical activity:    Days per week: Not on file    Minutes per session: Not on file  . Stress: Not on file  Relationships  . Social connections:    Talks on phone: Not on file    Gets together: Not on file    Attends religious service: Not on file    Active member of club or organization: Not on file    Attends meetings of clubs or organizations: Not on file    Relationship status: Not on file  Other Topics Concern  . Not on file  Social History Narrative  . Not on file     Review of Systems: A 12 point ROS discussed and pertinent positives are indicated in the HPI above.  All other systems are negative.  Review of Systems  Constitutional: Negative for fatigue and fever.  Respiratory: Positive for cough (chronic). Negative for shortness of breath.   Gastrointestinal: Negative for abdominal pain and nausea.  Musculoskeletal: Negative for back pain.  Psychiatric/Behavioral: Negative for behavioral problems and confusion.    Vital Signs: BP (!) 161/67   Pulse 60   Temp 98 F (36.7 C)   Resp 18   Ht 5\' 1"  (1.549 m)   Wt 106 lb (48.1 kg)   SpO2 100%   BMI 20.03 kg/m   Physical Exam  Constitutional: She is oriented to person, place, and time. She appears well-developed.  Cardiovascular: Normal rate, regular rhythm and normal heart sounds.  Pulmonary/Chest: Effort normal and breath sounds normal. No respiratory distress.  Abdominal: Soft. Bowel sounds are normal. She exhibits no distension.  Neurological: She is alert and oriented to  person, place, and time.  Skin: Skin is warm and dry.  Psychiatric: She has a normal mood and affect. Her behavior is normal. Judgment and thought content normal.  Nursing note and vitals reviewed.    MD Evaluation Airway: WNL Heart: WNL Abdomen: WNL Chest/ Lungs: WNL ASA  Classification: 3 Mallampati/Airway Score: One   Imaging: Dg Chest 2 View  Result Date: 08/13/2017 CLINICAL DATA:  Unspecified pneumothorax, follow-up. Bronchoscopy 08/03/2016. EXAM: CHEST - 2 VIEW COMPARISON:  08/04/2017 FINDINGS: Stable small left apical pneumothorax, 5% or less. Pneumothorax at the base has been replaced by  a small effusion. Known nodule at the left base with fiducial markers. Hyperinflation. Normal heart size. IMPRESSION: Small left apical pneumothorax, less than 5%, without progression since exam 8 days ago. Pneumothorax at the left base on prior has been replaced by pleural fluid. Electronically Signed   By: Monte Fantasia M.D.   On: 08/13/2017 08:48   Dg Chest Port 1 View  Result Date: 08/04/2017 CLINICAL DATA:  Pneumothorax after biopsy EXAM: PORTABLE CHEST 1 VIEW COMPARISON:  08/03/2017 FINDINGS: Left basilar and apical pneumothorax again noted, unchanged. Left base atelectasis. Right lung clear. Heart is normal size. IMPRESSION: Stable left apical and basilar pneumothorax. Electronically Signed   By: Rolm Baptise M.D.   On: 08/04/2017 09:22   Dg Chest Port 1 View  Result Date: 08/03/2017 CLINICAL DATA:  Pneumothorax. Status post bronchoscopy with left lower lobe biopsy. EXAM: PORTABLE CHEST 1 VIEW COMPARISON:  Earlier the same day FINDINGS: 1844 hours. Lungs are hyperexpanded. Interstitial markings are diffusely coarsened with chronic features. Left-sided pneumothorax is not substantially in the interval. Left lower lobe lesion again identified with fiducial markers in situ. The cardiopericardial silhouette is within normal limits for size. The visualized bony structures of the thorax are  intact. IMPRESSION: Stable appearance of the previously documented left pneumothorax. Electronically Signed   By: Misty Stanley M.D.   On: 08/03/2017 19:21   Dg Chest Port 1 View  Result Date: 08/03/2017 CLINICAL DATA:  Left pneumothorax after biopsy. EXAM: PORTABLE CHEST 1 VIEW COMPARISON:  Radiograph of same day. FINDINGS: The heart size and mediastinal contours are within normal limits. Atherosclerosis of thoracic aorta is noted. Right lung is clear. Stable mild left apical and basilar pneumothorax is noted compared to prior exam. The visualized skeletal structures are unremarkable. IMPRESSION: Stable mild left apical and basilar pneumothorax compared to prior exam. Electronically Signed   By: Marijo Conception, M.D.   On: 08/03/2017 16:26   Dg Chest Port 1 View  Result Date: 08/03/2017 CLINICAL DATA:  Status post bronchoscopy EXAM: PORTABLE CHEST 1 VIEW COMPARISON:  07/28/2017 FINDINGS: There are changes consistent with previous bronchoscopy with fiducial markers surrounding the patient's known left lower lobe mass. There is a mild pneumothorax identified with approximately 1 cm excursion along the apex as well as laterally in the left lung base. No other focal abnormality is seen. IMPRESSION: Left-sided pneumothorax with approximately 1 cm excursion as described. Critical Value/emergent results were called by telephone at the time of interpretation on 08/03/2017 at 1:20 pm to Dr. Baltazar Apo , who verbally acknowledged these results. Electronically Signed   By: Inez Catalina M.D.   On: 08/03/2017 13:22   Dg C-arm Bronchoscopy  Result Date: 08/03/2017 C-ARM BRONCHOSCOPY: Fluoroscopy was utilized by the requesting physician.  No radiographic interpretation.    Labs:  CBC: Recent Labs    06/18/17 1351 06/18/17 1402 06/19/17 1037 06/20/17 0526 08/03/17 0908  WBC 9.5  --  7.9 8.9 10.2  HGB 12.0 12.2 10.9* 11.4* 11.6*  HCT 36.8 36.0 34.3* 35.9* 37.1  PLT 429*  --  370 394 344    COAGS: Recent  Labs    06/18/17 1351 06/21/17 0609  INR 1.04 1.04  APTT 30  --     BMP: Recent Labs    06/19/17 0552 06/19/17 1037 06/19/17 1313 06/20/17 0526 06/21/17 0609 08/03/17 0908  NA 140  --   --  138 138 142  K 3.6  --  3.4* 4.6 4.2 3.8  CL 108  --   --  106 105 107  CO2 23  --   --  25 23 26   GLUCOSE 94  --   --  96 92 92  BUN <5*  --   --  5* 5* 8  CALCIUM 8.3*  --   --  8.4* 8.3* 9.3  CREATININE 0.73 0.78  --  0.88 0.79 0.85  GFRNONAA >60 >60  --  >60 >60 >60  GFRAA >60 >60  --  >60 >60 >60    LIVER FUNCTION TESTS: Recent Labs    05/08/17 1750 06/02/17 1314 06/18/17 1351 06/20/17 0526  BILITOT 0.6 0.4 0.6 0.4  AST 80* 25 23 20   ALT 31 13* 12* 12*  ALKPHOS 72 91 61 56  PROT 6.7 7.9 6.0* 5.6*  ALBUMIN 3.3* 3.5 2.8* 2.6*    TUMOR MARKERS: No results for input(s): AFPTM, CEA, CA199, CHROMGRNA in the last 8760 hours.  Assessment and Plan: Patient with past medical history of CAD, HTN, CVA presents with complaint of lung mass.  IR consulted for lung mass biopsy at the request of Dr. Servando Snare. Case reviewed by Dr. Pascal Lux who approves patient for procedure.  Patient presents today in their usual state of health.  She has been NPO and is not currently on blood thinners.   Risks and benefits discussed with the patient including, but not limited to bleeding, hemoptysis, respiratory failure requiring intubation, infection, pneumothorax requiring chest tube placement, stroke from air embolism or even death.  All of the patient's questions were answered, patient is agreeable to proceed. Consent signed and in chart.   Thank you for this interesting consult.  I greatly enjoyed meeting Fatimata Talsma and look forward to participating in their care.  A copy of this report was sent to the requesting provider on this date.  Electronically Signed: Docia Barrier, PA 08/31/2017, 10:36 AM   I spent a total of  30 Minutes   in face to face in clinical consultation,  greater than 50% of which was counseling/coordinating care for lung mass.

## 2017-08-31 NOTE — Procedures (Signed)
Interventional Radiology Procedure Note  Procedure: CT guided biopsy of LLL plmonary mass.   Complications: None  Estimated Blood Loss: None  Recommendations: - CLears - CXR in 2 hrs - ADAT if CXR clear - DC Home  Signed,  Criselda Peaches, MD

## 2017-08-31 NOTE — Discharge Instructions (Signed)
Lung Biopsy, Care After °This sheet gives you information about how to care for yourself after your procedure. Your health care provider may also give you more specific instructions depending on the type of biopsy you had. If you have problems or questions, contact your health care provider. °What can I expect after the procedure? °After the procedure, it is common to have: °· A cough. °· A sore throat. °· Pain where a needle, bronchoscope, or incision was used to collect a biopsy sample (biopsy site). ° °Follow these instructions at home: °Medicines °· Take over-the-counter and prescription medicines only as told by your health care provider. °· Do not drive for 24 hours if you were given a sedative. °· Do not drink alcohol while taking pain medicine. °· Do not drive or use heavy machinery while taking prescription pain medicine. °· To prevent or treat constipation while you are taking prescription pain medicine, your health care provider may recommend that you: °? Drink enough fluid to keep your urine clear or pale yellow. °? Take over-the-counter or prescription medicines. °? Eat foods that are high in fiber, such as fresh fruits and vegetables, whole grains, and beans. °? Limit foods that are high in fat and processed sugars, such as fried and sweet foods. °Activity °· If you had an incision during your procedure, avoid activities that may pull the incision site open. °· Return to your normal activities as told by your health care provider. Ask your health care provider what activities are safe for you. °If you had an open biopsy:  °· Follow instructions from your health care provider about how to take care of your incision. Make sure you: °? Wash your hands with soap and water before you change your bandage (dressing). If soap and water are not available, use hand sanitizer. °? Change your dressing as told by your health care provider. °? Leave stitches (sutures), skin glue, or adhesive strips in place. These  skin closures may need to stay in place for 2 weeks or longer. If adhesive strip edges start to loosen and curl up, you may trim the loose edges. Do not remove adhesive strips completely unless your health care provider tells you to do that. °· Check your incision area every day for signs of infection. Check for: °? Redness, swelling, or pain. °? Fluid or blood. °? Warmth. °? Pus or a bad smell. °General instructions °· It is up to you to get the results of your procedure. Ask your health care provider, or the department that is doing the procedure, when your results will be ready. °Contact a health care provider if: °· You have a fever. °· You have redness, swelling, or pain around your biopsy site. °· You have fluid or blood coming from your biopsy site. °· Your biopsy site feels warm to the touch. °· You have pus or a bad smell coming from your biopsy site. °Get help right away if: °· You cough up blood. °· You have trouble breathing. °· You have chest pain. °Summary °· After the procedure, it is common to have a sore throat and a cough. °· Return to your normal activities as told by your health care provider. Ask your health care provider what activities are safe for you. °· Take over-the-counter and prescription medicines only as told by your health care provider. °· Report any unusual symptoms to your health care provider. °This information is not intended to replace advice given to you by your health care provider. Make sure   you discuss any questions you have with your health care provider. °Document Released: 04/20/2016 Document Revised: 04/20/2016 Document Reviewed: 04/20/2016 °Elsevier Interactive Patient Education © 2018 [employer] ° °

## 2017-09-14 LAB — ACID FAST CULTURE WITH REFLEXED SENSITIVITIES (MYCOBACTERIA): Acid Fast Culture: NEGATIVE

## 2017-09-15 ENCOUNTER — Ambulatory Visit
Admission: RE | Admit: 2017-09-15 | Discharge: 2017-09-15 | Disposition: A | Discharge status: Home / Self Care | Payer: Medicare Other | Source: Ambulatory Visit | Attending: Radiation Oncology | Admitting: Radiation Oncology

## 2017-09-15 ENCOUNTER — Other Ambulatory Visit: Payer: Self-pay

## 2017-09-15 VITALS — BP 126/65 | HR 73 | Temp 98.1°F | Resp 18 | Ht 61.0 in | Wt 106.0 lb

## 2017-09-15 DIAGNOSIS — C3432 Malignant neoplasm of lower lobe, left bronchus or lung: Secondary | ICD-10-CM | POA: Diagnosis present

## 2017-09-15 DIAGNOSIS — R918 Other nonspecific abnormal finding of lung field: Secondary | ICD-10-CM

## 2017-09-15 DIAGNOSIS — Z510 Encounter for antineoplastic radiation therapy: Secondary | ICD-10-CM | POA: Insufficient documentation

## 2017-09-15 NOTE — Progress Notes (Signed)
I saw the patient today and she will proceed with SBRT planning. She was counseled on her pathology results as well.      Carola Rhine, PAC

## 2017-09-15 NOTE — Addendum Note (Signed)
Encounter addended by: Malena Edman, RN on: 09/15/2017 1:08 PM  Actions taken: Charge Capture section accepted

## 2017-09-15 NOTE — Addendum Note (Signed)
Encounter addended by: Malena Edman, RN on: 09/15/2017 1:04 PM  Actions taken: Order Reconciliation Section accessed, Home Medications modified, Medication List reviewed, Medication taking status modified, Allergies reviewed, Vitals modified

## 2017-09-22 DIAGNOSIS — Z510 Encounter for antineoplastic radiation therapy: Secondary | ICD-10-CM | POA: Diagnosis not present

## 2017-09-23 ENCOUNTER — Ambulatory Visit
Admission: RE | Admit: 2017-09-23 | Discharge: 2017-09-23 | Disposition: A | Discharge status: Home / Self Care | Payer: Medicare Other | Source: Ambulatory Visit | Attending: Radiation Oncology | Admitting: Radiation Oncology

## 2017-09-23 DIAGNOSIS — Z510 Encounter for antineoplastic radiation therapy: Secondary | ICD-10-CM | POA: Diagnosis not present

## 2017-09-27 ENCOUNTER — Ambulatory Visit
Admission: RE | Admit: 2017-09-27 | Discharge: 2017-09-27 | Disposition: A | Discharge status: Home / Self Care | Payer: Medicare Other | Source: Ambulatory Visit | Attending: Radiation Oncology | Admitting: Radiation Oncology

## 2017-09-27 DIAGNOSIS — Z510 Encounter for antineoplastic radiation therapy: Secondary | ICD-10-CM | POA: Diagnosis not present

## 2017-09-30 ENCOUNTER — Ambulatory Visit
Admission: RE | Admit: 2017-09-30 | Discharge: 2017-09-30 | Disposition: A | Discharge status: Home / Self Care | Payer: Medicare Other | Source: Ambulatory Visit | Attending: Radiation Oncology | Admitting: Radiation Oncology

## 2017-09-30 DIAGNOSIS — Z510 Encounter for antineoplastic radiation therapy: Secondary | ICD-10-CM | POA: Diagnosis not present

## 2017-10-03 ENCOUNTER — Ambulatory Visit
Admission: RE | Admit: 2017-10-03 | Discharge: 2017-10-03 | Disposition: A | Discharge status: Home / Self Care | Payer: Medicare Other | Source: Ambulatory Visit | Attending: Radiation Oncology | Admitting: Radiation Oncology

## 2017-10-03 DIAGNOSIS — Z510 Encounter for antineoplastic radiation therapy: Secondary | ICD-10-CM | POA: Insufficient documentation

## 2017-10-03 DIAGNOSIS — C3432 Malignant neoplasm of lower lobe, left bronchus or lung: Secondary | ICD-10-CM | POA: Diagnosis not present

## 2017-10-05 ENCOUNTER — Other Ambulatory Visit: Payer: Self-pay | Admitting: Radiation Oncology

## 2017-10-05 ENCOUNTER — Ambulatory Visit
Admission: RE | Admit: 2017-10-05 | Discharge: 2017-10-05 | Disposition: A | Discharge status: Home / Self Care | Payer: Medicare Other | Source: Ambulatory Visit | Attending: Radiation Oncology | Admitting: Radiation Oncology

## 2017-10-05 ENCOUNTER — Encounter: Payer: Self-pay | Admitting: Radiation Oncology

## 2017-10-05 DIAGNOSIS — Z510 Encounter for antineoplastic radiation therapy: Secondary | ICD-10-CM | POA: Diagnosis not present

## 2017-10-05 DIAGNOSIS — R918 Other nonspecific abnormal finding of lung field: Secondary | ICD-10-CM

## 2017-10-05 NOTE — Progress Notes (Signed)
The patient completed SBRT to the lung today for hear early stage lung cancer. She is doing well and tolerated treatment without difficulty. Her BP was 183/70 and she forgot her medication this am. I encouraged her to take it when she arrives home. She had repeat pressures checked and this came down to 160s/70s. She was given precautions to go to the ED if she had symptoms of MI or CVA.

## 2017-10-12 NOTE — Progress Notes (Signed)
  Radiation Oncology         [phone]) [phone] ________________________________  Name: [name] [name] MRN: [id number]  Date: 10/05/2017  DOB: 08-May-1934  End of Treatment Note  Diagnosis:   83 y.o. female with Putative Stage IA, cT1cN0 NSCLC of the LLL    Indication for treatment:  Curative       Radiation treatment dates:   09/23/2017, 09/26/2017, 09/30/2017, 10/03/2017, 10/05/2017  Site/dose:   The tumor in the LLL was treated with a course of stereotactic body radiation treatment. The patient received 60 Gy in 5 fractions at 12 Gy per fraction.  Narrative: The patient tolerated radiation treatment relatively well.   The patient did not have any signs of acute toxicity during treatment.  Plan: The patient has completed radiation treatment. The patient will return to radiation oncology clinic for routine followup in one month. I advised the patient to call or return sooner if they have any questions or concerns related to their recovery or treatment.   ------------------------------------------------  Jodelle Gross, MD, PhD  This document serves as a record of services personally performed by Kyung Rudd, MD. It was created on his behalf by Rae Lips, a trained medical scribe. The creation of this record is based on the scribe's personal observations and the provider's statements to them. This document has been checked and approved by the attending provider.

## 2017-10-26 DIAGNOSIS — C3432 Malignant neoplasm of lower lobe, left bronchus or lung: Secondary | ICD-10-CM | POA: Insufficient documentation

## 2017-10-26 NOTE — Progress Notes (Signed)
[facility] Radiation Oncology Simulation and Treatment Planning Note   Name:  [name] [name] MRN: [id number]   Date: 10/26/2017  DOB: [birth year]  Status:outpatient    DIAGNOSIS:    ICD-10-CM   1. Malignant 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 (HCC) C34.32      CONSENT VERIFIED:yes   SET UP: Patient is setup supine   IMMOBILIZATION: The patient was immobilized using a customized Vac Loc bag/ blue bag and customized accuform device   NARRATIVE:The patient was brought to the [place] suite.  Identity was confirmed.  All relevant records and images related to the planned course of therapy were reviewed.  Then, the patient was positioned in a stable reproducible clinical set-up for radiation therapy. Abdominal compression was applied by me.  4D CT images were obtained and reproducible breathing pattern was confirmed. Free breathing CT images were obtained.  Skin markings were placed.  The CT images were loaded into the planning software where the target and avoidance structures were contoured.  The radiation prescription was entered and confirmed.    TREATMENT PLANNING NOTE:  Treatment planning then occurred. I have requested : IMRT planning.This treatment technique is medically necessary due to the high-dose of radiation delivered to the target region which is in close proximity to adjacent critical normal structures.  3 dimensional simulation is performed and dose volume histogram of the gross tumor volume, planning tumor volume and criticial normal structures including the spinal cord and lungs were analyzed and requested.  Special treatment procedure was performed due to high dose per fraction.  The patient will be monitored for increased risk of toxicity.  Daily imaging using cone beam CT will be used for target localization.  I anticipate that the patient will receive 60 Gy in 5 fractions to target volume. Further adjustments will be made based on  the planning process is necessary.  ------------------------------------------------  Jodelle Gross, MD, PhD

## 2017-10-26 NOTE — Progress Notes (Signed)
  Radiation Oncology         [phone] ________________________________  Name: Vanesa Renier MRN: [id number]  Date: 09/15/2017  DOB: [birth year]  RESPIRATORY MOTION MANAGEMENT SIMULATION  NARRATIVE:  In order to account for effect of respiratory motion on target structures and other organs in the planning and delivery of radiotherapy, this patient underwent respiratory motion management simulation.  To accomplish this, when the patient was brought to the CT simulation planning suite, 4D respiratoy motion management CT images were obtained.  The CT images were loaded into the planning software.  Then, using a variety of tools including Cine, MIP, and standard views, the target volume and planning target volumes (PTV) were delineated.  Avoidance structures were contoured.  Treatment planning then occurred.  Dose volume histograms were generated and reviewed for each of the requested structure.  The resulting plan was carefully reviewed and approved today.   ------------------------------------------------  Jodelle Gross, MD, PhD

## 2017-11-14 ENCOUNTER — Telehealth: Payer: Self-pay | Admitting: *Deleted

## 2017-11-14 ENCOUNTER — Inpatient Hospital Stay: Payer: Medicare Other | Attending: Radiation Oncology

## 2017-11-14 ENCOUNTER — Other Ambulatory Visit: Payer: Self-pay | Admitting: *Deleted

## 2017-11-14 ENCOUNTER — Encounter (HOSPITAL_COMMUNITY): Payer: Self-pay

## 2017-11-14 ENCOUNTER — Ambulatory Visit (HOSPITAL_COMMUNITY)
Admission: RE | Admit: 2017-11-14 | Discharge: 2017-11-14 | Disposition: A | Discharge status: Home / Self Care | Payer: Medicare Other | Source: Ambulatory Visit | Attending: Radiation Oncology | Admitting: Radiation Oncology

## 2017-11-14 DIAGNOSIS — R918 Other nonspecific abnormal finding of lung field: Secondary | ICD-10-CM | POA: Diagnosis not present

## 2017-11-14 DIAGNOSIS — J432 Centrilobular emphysema: Secondary | ICD-10-CM | POA: Insufficient documentation

## 2017-11-14 DIAGNOSIS — C3432 Malignant neoplasm of lower lobe, left bronchus or lung: Secondary | ICD-10-CM | POA: Insufficient documentation

## 2017-11-14 LAB — COMPREHENSIVE METABOLIC PANEL
ALBUMIN: 3.7 g/dL (ref 3.5–5.0)
ALK PHOS: 94 U/L (ref 38–126)
ALT: 13 U/L (ref 0–44)
ANION GAP: 8 (ref 5–15)
AST: 20 U/L (ref 15–41)
BUN: 14 mg/dL (ref 8–23)
CALCIUM: 9 mg/dL (ref 8.9–10.3)
CO2: 25 mmol/L (ref 22–32)
Chloride: 108 mmol/L (ref 98–111)
Creatinine, Ser: 0.89 mg/dL (ref 0.44–1.00)
GFR calc non Af Amer: 59 mL/min — ABNORMAL LOW (ref >60)
GLUCOSE: 90 mg/dL (ref 70–99)
POTASSIUM: 3.8 mmol/L (ref 3.5–5.1)
SODIUM: 141 mmol/L (ref 135–145)
Total Bilirubin: 0.4 mg/dL (ref 0.3–1.2)
Total Protein: 7.2 g/dL (ref 6.5–8.1)

## 2017-11-14 MED ORDER — IOHEXOL 300 MG/ML  SOLN
75.0000 mL | Freq: Once | INTRAMUSCULAR | Status: AC | PRN
  Administered 2017-11-14: 75 mL via INTRAVENOUS

## 2017-11-14 NOTE — Telephone Encounter (Signed)
XXXX 

## 2017-11-16 ENCOUNTER — Telehealth: Payer: Self-pay | Admitting: Radiation Oncology

## 2017-11-16 DIAGNOSIS — C3432 Malignant neoplasm of lower lobe, left bronchus or lung: Secondary | ICD-10-CM

## 2017-11-16 NOTE — Telephone Encounter (Signed)
I called and spoke with the patient regarding her CT results and the recommendation to proceed with repeat scan in 6 months. She is in agreement.

## 2017-11-16 NOTE — Telephone Encounter (Signed)
I called to make sure the patient knew she did not need to come for next week's appt.

## 2017-11-17 ENCOUNTER — Telehealth: Payer: Self-pay | Admitting: Radiation Oncology

## 2017-11-17 NOTE — Telephone Encounter (Signed)
Pt's husband called and we reviewed the discussion from yesterday regarding his wife's scan and that we would repeat this in 6 months and see each other then. She does not need to come in on Monday as we've reviewed these results by phone.

## 2017-11-21 ENCOUNTER — Inpatient Hospital Stay: Admission: RE | Admit: 2017-11-21 | Payer: Self-pay | Source: Ambulatory Visit | Admitting: Radiation Oncology

## 2018-04-21 ENCOUNTER — Other Ambulatory Visit: Payer: Self-pay | Admitting: Family Medicine

## 2018-04-21 ENCOUNTER — Ambulatory Visit
Admission: RE | Admit: 2018-04-21 | Discharge: 2018-04-21 | Disposition: A | Discharge status: Home / Self Care | Payer: Medicare Other | Source: Ambulatory Visit | Attending: Family Medicine | Admitting: Family Medicine

## 2018-04-21 DIAGNOSIS — C3490 Malignant neoplasm of unspecified part of unspecified bronchus or lung: Secondary | ICD-10-CM

## 2018-04-24 ENCOUNTER — Telehealth: Payer: Self-pay | Admitting: *Deleted

## 2018-04-24 NOTE — Telephone Encounter (Signed)
CALLED PATIENT TO INFORM OF LAB AND CT FOR 05/02/18 - 1:30 PM FOR STAT LABS AND CT - ARRIVAL TIME - 2:15 PM @ [facility], PT. TO HAVE WATER ONLY - 4 HRS. PRIOR TO TEST, PATIENT TO COME FOR RESULTS WITH ALISON PERKINS ON 05/03/18 @ 3:30 PM, SPOKE WITH PATIENT AND SHE IS AWARE OF THESE APPTS.

## 2018-05-02 ENCOUNTER — Ambulatory Visit
Admission: RE | Admit: 2018-05-02 | Discharge: 2018-05-02 | Disposition: A | Discharge status: Home / Self Care | Payer: Medicare Other | Source: Ambulatory Visit | Attending: Radiation Oncology | Admitting: Radiation Oncology

## 2018-05-02 ENCOUNTER — Ambulatory Visit (HOSPITAL_COMMUNITY)
Admission: RE | Admit: 2018-05-02 | Discharge: 2018-05-02 | Disposition: A | Discharge status: Home / Self Care | Payer: Medicare Other | Source: Ambulatory Visit | Attending: Radiation Oncology | Admitting: Radiation Oncology

## 2018-05-02 DIAGNOSIS — C3432 Malignant neoplasm of lower lobe, left bronchus or lung: Secondary | ICD-10-CM | POA: Diagnosis not present

## 2018-05-02 DIAGNOSIS — Z923 Personal history of irradiation: Secondary | ICD-10-CM | POA: Insufficient documentation

## 2018-05-02 DIAGNOSIS — Z79899 Other long term (current) drug therapy: Secondary | ICD-10-CM | POA: Insufficient documentation

## 2018-05-02 LAB — BUN & CREATININE (CHCC)
BUN: 9 mg/dL (ref 8–23)
Creatinine: 1 mg/dL (ref 0.44–1.00)
GFR, Est AFR Am: >60 mL/min (ref >60)
GFR, Estimated: 53 mL/min — ABNORMAL LOW (ref >60)

## 2018-05-02 MED ORDER — IOHEXOL 300 MG/ML  SOLN: 100.0000 mL | Freq: Once | INTRAMUSCULAR | Status: DC | PRN

## 2018-05-02 MED ORDER — IOHEXOL 300 MG/ML  SOLN
75.0000 mL | Freq: Once | INTRAMUSCULAR | Status: AC | PRN
  Administered 2018-05-02: 75 mL via INTRAVENOUS

## 2018-05-02 MED ORDER — SODIUM CHLORIDE (PF) 0.9 % IJ SOLN
INTRAMUSCULAR | Status: AC
  Filled 2018-05-02: qty 50

## 2018-05-03 ENCOUNTER — Other Ambulatory Visit: Payer: Self-pay

## 2018-05-03 ENCOUNTER — Encounter: Payer: Self-pay | Admitting: Radiation Oncology

## 2018-05-03 ENCOUNTER — Ambulatory Visit
Admission: RE | Admit: 2018-05-03 | Discharge: 2018-05-03 | Disposition: A | Discharge status: Home / Self Care | Payer: Medicare Other | Source: Ambulatory Visit | Attending: Radiation Oncology | Admitting: Radiation Oncology

## 2018-05-03 VITALS — BP 115/57 | HR 83 | Temp 98.0°F | Resp 20 | Ht 62.0 in | Wt 106.8 lb

## 2018-05-03 DIAGNOSIS — C3432 Malignant neoplasm of lower lobe, left bronchus or lung: Secondary | ICD-10-CM

## 2018-05-03 DIAGNOSIS — M546 Pain in thoracic spine: Secondary | ICD-10-CM

## 2018-05-03 NOTE — Progress Notes (Signed)
Radiation Oncology         [phone] ________________________________  Name: [name] [name] MRN: [id number]  Date of Service: 05/03/2018 DOB: 1935-02-12  Follow Up Note  CC: Leonard Downing, MD  Curt Bears, MD  Diagnosis:  Stage IA, cT1cN0 NSCLC, squamous cell carcinoma of the LLL    Interval Since Last Radiation:  6  Months  09/23/2017- 10/05/2017 SBRT Treatment: The tumor in the LLL was treated with a course of stereotactic body radiation treatment. The patient received 60 Gy in 5 fractions at 12 Gy per fraction.  Narrative: In summary this is a pleasant 84 y.o. who was found to have a mass in the left lower lobe after experiencing an ischemic CVA in the spring 2019.  She underwent CT imaging revealing a tumor measuring 3 x 2.2 cm, and pet imaging revealed an SUV in this location at 13.2.  She underwent navigational bronchoscopy and fiducial marker placement but her specimen was read as atypical cells nondiagnostic for malignancy. She underwent biopsy on 08/31/17 which revealed a squamous cell carcinoma.  She was counseled on the rationale for stereotactic body radiation and she was not a surgical candidate.  She subsequently completed this treatment in July 2019.  Her posttreatment imaging    On 11/14/2017 revealed a band of pleural-parenchymal thickening along the oblique fissure that had been decreased in size measuring 11 mm.  She was counseled on the rationale for repeat imaging in approximately 6 months per NCCN guidelines and underwent this procedure on 05/02/2018.  This revealed progressive masslike thickening in the anterior left lower lobe measuring up to 3.9 x 2.5 cm in the area of fiducial markers consistent with radiation effect.  In addition however there were concerns for compression fractures in the thoracic spine.  She comes today to review these results.        On review of systems, the patient reports that she is doing well overall. She denies any chest pain,  shortness of breath, cough, fevers, chills, night sweats, unintended weight changes.  She does however describe pain that originates along the T9-10 region, with radiation of her pain laterally on both sides but not extending around the flanks.  She denies any weakness, or change in control of bowel or bladder.  She denies any difficulty with lower extremity weakness, or upper extremity weakness. She  denies any bowel or bladder disturbances, and denies abdominal pain, nausea or vomiting. She denies any new musculoskeletal or joint aches or pains, new skin lesions or concerns. A complete review of systems is obtained and is otherwise negative.   ALLERGIES:  is allergic to other.  Meds: Current Outpatient Medications  Medication Sig Dispense Refill  . aspirin 81 MG tablet Take 1 tablet (81 mg total) by mouth 3 (three) times a week. 30 tablet   . atorvastatin (LIPITOR) 20 MG tablet Take 1 tablet (20 mg total) by mouth daily at 6 PM. 30 tablet 0  . EPINEPHrine (EPIPEN 2-PAK) 0.3 mg/0.3 mL IJ SOAJ injection Inject 0.3 mg into the muscle once as needed (for anaphylaxis).    . felodipine (PLENDIL) 10 MG 24 hr tablet Take 10 mg by mouth daily.      . Multiple Vitamin (MULTIVITAMIN WITH MINERALS) TABS tablet Take 1 tablet by mouth daily.    . nitroGLYCERIN (NITROSTAT) 0.4 MG SL tablet Place 1 tablet (0.4 mg total) under the tongue every 5 (five) minutes as needed for chest pain. 25 tablet 3   No current facility-administered medications  for this encounter.     Physical Findings:  height is 5\' 2"  (1.575 m) and weight is 106 lb 12.8 oz (48.4 kg). Her oral temperature is 98 F (36.7 C). Her blood pressure is 115/57 (abnormal) and her pulse is 83. Her respiration is 20 and oxygen saturation is 95%.  Pain Assessment Pain Score: 0-No pain/10 In general this is a well appearing Caucasian female in no acute distress.  She's alert and oriented x4 and appropriate throughout the examination. Cardiopulmonary  assessment is negative for acute distress and she exhibits normal effort.  Chest is clear to auscultation bilaterally and cardiovascular exam reveals a regular rate and rhythm, no clicks rubs or murmurs are auscultated.  She does have the ability to point out the location of her pain which again is between T10 and T11.  Lab Findings: Lab Results  Component Value Date   WBC 10.1 08/31/2017   HGB 12.7 08/31/2017   HCT 40.2 08/31/2017   MCV 87.2 08/31/2017   PLT 284 08/31/2017     Radiographic Findings: Dg Chest 2 View  Result Date: 04/21/2018 CLINICAL DATA:  Mid back pain for 1 week EXAM: CHEST - 2 VIEW COMPARISON:  08/31/2017 FINDINGS: Upper normal heart size with note of a coronary stent. Mediastinal contours and pulmonary vascularity normal. Atherosclerotic calcification aorta. Subsegmental atelectasis versus scarring at LEFT base at site of prior surgery, little changed. Underlying emphysematous changes. No acute infiltrate, pleural effusion or pneumothorax. Bones demineralized. IMPRESSION: Chronic atelectasis versus scarring at LEFT base at site of prior surgery. Emphysematous changes without acute infiltrate. Electronically Signed   By: Lavonia Dana M.D.   On: 04/21/2018 18:03   Ct Chest W Contrast  Result Date: 05/03/2018 CLINICAL DATA:  Left non-small cell lung cancer, diagnosed 1 year ago, XRT complete EXAM: CT CHEST WITH CONTRAST TECHNIQUE: Multidetector CT imaging of the chest was performed during intravenous contrast administration. CONTRAST:  54mL OMNIPAQUE IOHEXOL 300 MG/ML  SOLN COMPARISON:  11/14/2017 FINDINGS: Cardiovascular: Heart is normal in size.  No pericardial effusion. No evidence of thoracic aortic aneurysm. Atherosclerotic calcifications of the aortic arch. Three vessel coronary atherosclerosis. Mediastinum/Nodes: No suspicious mediastinal lymphadenopathy. Visualized thyroid is unremarkable. Lungs/Pleura: Mild to moderate centrilobular emphysematous changes, upper lobe  predominant. No focal consolidation. Progressive masslike thickening in the anterior left lower lobe, now measuring 3.9 x 2.5 cm, in the area of fiducial markers (series 5/image 99). While this may reflect progressive bandlike fibrosis, given the degree of soft tissue, recurrent disease is difficult to exclude by CT. Flat appearance on coronal imaging (series 6/image 58) favors fibrosis/radiation changes. Mild lingular scarring/radiation changes. Otherwise, no suspicious pulmonary nodules. No pleural effusion or pneumothorax. Upper Abdomen: Visualized upper abdomen is notable for a stable lobular appearance of the spleen. Musculoskeletal: Moderate compression fracture deformity at T9, new. Mild compression fracture deformities at T6, T10, T12, new. Chronic mild L1 superior compression fracture deformity. IMPRESSION: Progressive masslike thickening in the anterior lower lobe, in the area of fiducial markers, possibly reflecting progressive bandlike fibrosis/radiation changes (favored), although indeterminate. Consider follow-up CT chest in 3-6 months. If there is a high index of suspicion, consider PET-CT. Aortic Atherosclerosis (ICD10-I70.0) and Emphysema (ICD10-J43.9). Electronically Signed   By: Julian Hy M.D.   On: 05/03/2018 08:09    Impression/Plan: 1. Stage IA, cT1cN0 NSCLC, squamous cell carcinoma of the LLL. The patient is doing well overall since completing raditoherapy and her post treatment imaging is reassuring. The changes per radiology do appear to be consistent with post  radiation change. Given that there has been some concerns though in the size discrepancy, we will repeat a CT scan in 3 months time, I will contact her by phone to review these results.  If this appears to be stable, will resume scans every 6 months per NCCN guidelines.  She is in agreement with this plan. 2. Thoracic compression fractures.  The patient is symptomatic, and there are multi levels that are affected by these  changes.  We discussed the rationale to proceed with an MRI scan to rule out further concerns especially given the fact that she has had prior malignancy.  I will review her images with IR, and we will plan to refer her to consider vertebral augmentation plus or minus osteo-cool.  She is in agreement with this plan    Carola Rhine, PAC

## 2018-05-05 ENCOUNTER — Telehealth: Payer: Self-pay | Admitting: *Deleted

## 2018-05-05 ENCOUNTER — Other Ambulatory Visit: Payer: Self-pay | Admitting: Radiation Oncology

## 2018-05-05 DIAGNOSIS — M546 Pain in thoracic spine: Secondary | ICD-10-CM

## 2018-05-05 NOTE — Telephone Encounter (Signed)
CALLED PATIENT TO INFORM OF MRI FOR 05-10-18 - ARRIVAL TIME - 4:30 PM @ [facility], NO RESTRICTIONS TO TEST, SPOKE WITH PATIENT AND SHE IS AWARE OF THIS TEST.

## 2018-05-08 ENCOUNTER — Other Ambulatory Visit: Payer: Self-pay | Admitting: Radiation Oncology

## 2018-05-08 DIAGNOSIS — M546 Pain in thoracic spine: Secondary | ICD-10-CM

## 2018-05-08 DIAGNOSIS — C3432 Malignant neoplasm of lower lobe, left bronchus or lung: Secondary | ICD-10-CM

## 2018-05-10 ENCOUNTER — Ambulatory Visit (HOSPITAL_COMMUNITY)
Admission: RE | Admit: 2018-05-10 | Discharge: 2018-05-10 | Disposition: A | Discharge status: Home / Self Care | Payer: Medicare Other | Source: Ambulatory Visit | Attending: Radiation Oncology | Admitting: Radiation Oncology

## 2018-05-10 DIAGNOSIS — M546 Pain in thoracic spine: Secondary | ICD-10-CM | POA: Insufficient documentation

## 2018-05-10 DIAGNOSIS — C3432 Malignant neoplasm of lower lobe, left bronchus or lung: Secondary | ICD-10-CM | POA: Diagnosis not present

## 2018-05-10 MED ORDER — GADOBUTROL 1 MMOL/ML IV SOLN
5.0000 mL | Freq: Once | INTRAVENOUS | Status: AC | PRN
  Administered 2018-05-10: 5 mL via INTRAVENOUS

## 2018-05-15 ENCOUNTER — Telehealth: Payer: Self-pay | Admitting: Radiation Oncology

## 2018-05-15 NOTE — Telephone Encounter (Signed)
LM for pt to let her know I sent along her MRI films to Dr. Earleen Newport in IR. She is being seen in IR clinic tomorrow to consider vertebral augmentation.

## 2018-05-16 ENCOUNTER — Encounter: Payer: Self-pay | Admitting: Radiology

## 2018-05-16 ENCOUNTER — Ambulatory Visit
Admission: RE | Admit: 2018-05-16 | Discharge: 2018-05-16 | Disposition: A | Discharge status: Home / Self Care | Payer: Medicare Other | Source: Ambulatory Visit | Attending: Radiation Oncology | Admitting: Radiation Oncology

## 2018-05-16 DIAGNOSIS — M546 Pain in thoracic spine: Secondary | ICD-10-CM

## 2018-05-16 DIAGNOSIS — C3432 Malignant neoplasm of lower lobe, left bronchus or lung: Secondary | ICD-10-CM

## 2018-05-16 HISTORY — PX: IR RADIOLOGIST EVAL & MGMT: IMG5224

## 2018-05-16 NOTE — Consult Note (Signed)
Chief Complaint: Back pain and compression fractures  Referring Physician(s): Hayden Pedro  History of Present Illness: [name] [name] is a 84 y.o. female presenting today as a scheduled consultation for vertebral augmentation candidacy, kindly referred by Shona Simpson of our radiation oncology service.    [name] [name] is here today with her husband for the interview.    [name] [name] had an MRI that was performed of the thoracic spine to evaluate suspicious CT findings that were seen on a chest CT.  She was receiving surveillance CT of the chest after radiation therapy of a lung cancer. The imaging confirmed fractures of T9, T10, and T12.  There is no evidence of tumor involvement/pathologic fracture.    They tell me that they cannot remember or precisely estimate when she started complaining of back pain.  She denies any trauma or fall that may have been associated. She says that she remembers pain for a long time, but was attributing this to her age.     She says that the intensity on a pain scale is 5/10 intensity.  She tells me that she does not feel restricted in any of her activities of daily living.  She tells me that she is able to go about her day as usual, and although she does use some help from her family for ambulating, she is uncertain if this is from general weakness or from residual symptoms of a prior left-sided stroke.    She is not taking any specific pain medications for the known fracture.  She is not kept up at night by pain.  When I ask if there are any activites that she wishes to do that she is unable because of the pain, she says there are none.   She does take naps, but is up all day.  I would say her ECOG is 1.  MRI was performed on 05/10/2018  Past Medical History:  Diagnosis Date  . Anxiety    pt denies  . Arthritis    back, legs  . CAD (coronary artery disease) 2008   s/p Taxus Element Perseus Study stent (DES) to OM1 in 08/2006; EF 65%  . HTN  (hypertension)   . Hypercholesterolemia   . Ischemic stroke ([place])   . Mass of lower lobe of left lung 08/2017  . Pneumonia 5/16   hospitalized for 4 days    Past Surgical History:  Procedure Laterality Date  . APPENDECTOMY  1950  . BRONCHOSCOPY  08/03/2017   BIOPSY  . CORONARY ANGIOPLASTY WITH STENT PLACEMENT    . FUDUCIAL PLACEMENT Left 08/03/2017   Procedure: PLACEMENT OF FUDUCIAL;  Surgeon: Collene Gobble, MD;  Location: [place];  Service: Thoracic;  Laterality: Left;  . GANGLION CYST EXCISION Right 12/26/2014   Procedure: EXCISON OF RIGHT FOOT GANGLION CYST;  Surgeon: Wylene Simmer, MD;  Location: [place];  Service: Orthopedics;  Laterality: Right;  . IR RADIOLOGIST EVAL & MGMT  05/16/2018  . lower back surgery  1976  . NECK SURGERY  1990  . PARTIAL HYSTERECTOMY  1977  . PCI of hte lesion in th marginal branch circumflex artery    . TEE WITHOUT CARDIOVERSION N/A 06/21/2017   Procedure: TRANSESOPHAGEAL ECHOCARDIOGRAM (TEE);  Surgeon: Acie Fredrickson Wonda Cheng, MD;  Location: [place];  Service: Cardiovascular;  Laterality: N/A;  . VIDEO BRONCHOSCOPY WITH ENDOBRONCHIAL NAVIGATION N/A 08/03/2017   Procedure: VIDEO BRONCHOSCOPY WITH ENDOBRONCHIAL NAVIGATION;  Surgeon: Collene Gobble, MD;  Location: [place];  Service:  Thoracic;  Laterality: N/A;    Allergies: Other  Medications: Prior to Admission medications   Medication Sig Start Date End Date Taking? Authorizing Provider  aspirin 81 MG tablet Take 1 tablet (81 mg total) by mouth 3 (three) times a week. 08/05/17  Yes Ollis, Brandi L, NP  Calcium Citrate (CITRACAL PO) Take 2 tablets by mouth daily.   Yes [provider]  EPINEPHrine (EPIPEN 2-PAK) 0.3 mg/0.3 mL IJ SOAJ injection Inject 0.3 mg into the muscle once as needed (for anaphylaxis).   Yes [provider]  felodipine (PLENDIL) 10 MG 24 hr tablet Take 10 mg by mouth daily.     Yes [provider]  Multiple Vitamin (MULTIVITAMIN WITH MINERALS)  TABS tablet Take 1 tablet by mouth daily. 05/11/17  Yes Georgette Shell, MD  nitroGLYCERIN (NITROSTAT) 0.4 MG SL tablet Place 1 tablet (0.4 mg total) under the tongue every 5 (five) minutes as needed for chest pain. 07/10/14  Yes Josue Hector, MD     Family History  Problem Relation Age of Onset  . Heart failure Mother   . Hypertension Mother     Social History   Socioeconomic History  . Marital status: Married    Spouse name: Not on file  . Number of children: Not on file  . Years of education: Not on file  . Highest education level: Not on file  Occupational History  . Not on file  Social Needs  . Financial resource strain: Not on file  . Food insecurity:    Worry: Not on file    Inability: Not on file  . Transportation needs:    Medical: No    Non-medical: No  Tobacco Use  . Smoking status: Former Smoker    Packs/day: 1.00    Years: 40.00    Pack years: 40.00    Types: Cigarettes    Last attempt to quit: 2008    Years since quitting: 12.1  . Smokeless tobacco: Never Used  Substance and Sexual Activity  . Alcohol use: No  . Drug use: No  . Sexual activity: Not on file  Lifestyle  . Physical activity:    Days per week: Not on file    Minutes per session: Not on file  . Stress: Not on file  Relationships  . Social connections:    Talks on phone: Not on file    Gets together: Not on file    Attends religious service: Not on file    Active member of club or organization: Not on file    Attends meetings of clubs or organizations: Not on file    Relationship status: Not on file  Other Topics Concern  . Not on file  Social History Narrative  . Not on file    ECOG Status: 1 - Symptomatic but completely ambulatory  Review of Systems: A 12 point ROS discussed and pertinent positives are indicated in the HPI above.  All other systems are negative.  Review of Systems  Vital Signs: BP (!) 114/51   Pulse 83   Temp 98.1 F (36.7 C) (Oral)   Resp 14   Ht  5\' 2"  (1.575 m)   Wt 48.1 kg   SpO2 95%   BMI 19.39 kg/m   Physical Exam General: 84 yo female appearing  stated age.  Well-developed, well-nourished.  No distress. HEENT: Atraumatic, normocephalic.  Glasses. Conjugate gaze, extra-ocular motor intact. No scleral icterus or scleral injection. No lesions on external ears, nose, lips,  or gums.  Oral mucosa moist, pink.  Neck: Symmetric with no goiter enlargement.  Chest/Lungs:  Symmetric chest with inspiration/expiration.  No labored breathing.  Clear to auscultation with no wheezes, rhonchi, or rales.  Heart:  RRR, with no third heart sounds appreciated. No JVD appreciated.  Abdomen:  Soft, NT/ND, with + bowel sounds.   Genito-urinary: Deferred Neurologic: Alert & Oriented to person, place, and time.   Normal affect and insight.  Appropriate questions.  Moving all 4 extremities with gross sensory intact.  MSK:  Upon palpation, I am able to elicit some TTP along the midline spine, near the levels of injury.  No deformity or step-off.   Mallampati Score:     Imaging: Dg Chest 2 View  Result Date: 04/21/2018 CLINICAL DATA:  Mid back pain for 1 week EXAM: CHEST - 2 VIEW COMPARISON:  08/31/2017 FINDINGS: Upper normal heart size with note of a coronary stent. Mediastinal contours and pulmonary vascularity normal. Atherosclerotic calcification aorta. Subsegmental atelectasis versus scarring at LEFT base at site of prior surgery, little changed. Underlying emphysematous changes. No acute infiltrate, pleural effusion or pneumothorax. Bones demineralized. IMPRESSION: Chronic atelectasis versus scarring at LEFT base at site of prior surgery. Emphysematous changes without acute infiltrate. Electronically Signed   By: Lavonia Dana M.D.   On: 04/21/2018 18:03   Ct Chest W Contrast  Result Date: 05/03/2018 CLINICAL DATA:  Left non-small cell lung cancer, diagnosed 1 year ago, XRT complete EXAM: CT CHEST WITH CONTRAST TECHNIQUE: Multidetector CT imaging of  the chest was performed during intravenous contrast administration. CONTRAST:  84mL OMNIPAQUE IOHEXOL 300 MG/ML  SOLN COMPARISON:  11/14/2017 FINDINGS: Cardiovascular: Heart is normal in size.  No pericardial effusion. No evidence of thoracic aortic aneurysm. Atherosclerotic calcifications of the aortic arch. Three vessel coronary atherosclerosis. Mediastinum/Nodes: No suspicious mediastinal lymphadenopathy. Visualized thyroid is unremarkable. Lungs/Pleura: Mild to moderate centrilobular emphysematous changes, upper lobe predominant. No focal consolidation. Progressive masslike thickening in the anterior left lower lobe, now measuring 3.9 x 2.5 cm, in the area of fiducial markers (series 5/image 99). While this may reflect progressive bandlike fibrosis, given the degree of soft tissue, recurrent disease is difficult to exclude by CT. Flat appearance on coronal imaging (series 6/image 58) favors fibrosis/radiation changes. Mild lingular scarring/radiation changes. Otherwise, no suspicious pulmonary nodules. No pleural effusion or pneumothorax. Upper Abdomen: Visualized upper abdomen is notable for a stable lobular appearance of the spleen. Musculoskeletal: Moderate compression fracture deformity at T9, new. Mild compression fracture deformities at T6, T10, T12, new. Chronic mild L1 superior compression fracture deformity. IMPRESSION: Progressive masslike thickening in the anterior lower lobe, in the area of fiducial markers, possibly reflecting progressive bandlike fibrosis/radiation changes (favored), although indeterminate. Consider follow-up CT chest in 3-6 months. If there is a high index of suspicion, consider PET-CT. Aortic Atherosclerosis (ICD10-I70.0) and Emphysema (ICD10-J43.9). Electronically Signed   By: Julian Hy M.D.   On: 05/03/2018 08:09   Mr Thoracic Spine W Wo Contrast  Result Date: 05/10/2018 CLINICAL DATA:  84 year old female with back pain and compression fractures in the setting of  lung cancer. EXAM: MRI THORACIC WITHOUT AND WITH CONTRAST TECHNIQUE: Multiplanar and multiecho pulse sequences of the thoracic spine were obtained without and with intravenous contrast. CONTRAST:  5 milliliters Gadavist COMPARISON:  Chest CT 05/02/2018 and earlier. FINDINGS: Limited cervical spine imaging: C4 through C7 cervical disc and endplate degeneration. Thoracic spine segmentation:  Normal. Alignment: Mild degenerative appearing anterolisthesis of C7 on T1. Mild dextroconvex thoracic curvature. Preserved to mildly  exaggerated upper thoracic kyphosis. No thoracic spondylolisthesis. Vertebrae: Widespread benign chronic compression deformities, with perhaps the only thoracic levels completely unaffected by recent or chronic compression fractures being T4, T5, T11. The most pronounced chronic benign compression fractures are at T6 and L1. Moderate compression fractures of T9 and T10 with widespread marrow edema and enhancement. Loss of height appears stable from the January CT. There is mild marrow edema in the right T10 pedicle and the right T10 transverse process the, but otherwise the posterior elements appear intact. There is mild retropulsion of bone at each level, but no extraosseous or epidural soft tissue associated with the fractures. There is a moderate T12 superior endplate compression fracture which demonstrates trace marrow edema (series 6, image 7) and mild retropulsion. This is benign. There is no suspicious marrow lesion identified in the thoracic spine or visible posterior ribs. Cord: Spinal cord signal is within normal limits at all visualized levels. The conus is at L1. No abnormal intradural enhancement or dural thickening. Paraspinal and other soft tissues: Stable from the January CT. Disc levels: Age concordant thoracic disc and endplate degeneration. The spinal canal is capacious and there is no spinal stenosis in the thoracic spine. There is partially visible degenerative cervical spinal  stenosis. IMPRESSION: 1. Benign osteoporotic compression fractures throughout the thoracic and upper lumbar spine with no metastatic disease identified. 2. Acute to subacute fractures of T9 and T10, and a late subacute to early chronic T12 fracture, appear stable since the January Chest CT. Electronically Signed   By: Genevie Ann M.D.   On: 05/10/2018 22:32   Ir Radiologist Eval & Mgmt  Result Date: 05/16/2018 Please refer to notes tab for details about interventional procedure. (Op Note)   Labs:  CBC: Recent Labs    06/19/17 1037 06/20/17 0526 08/03/17 0908 08/31/17 1032  WBC 7.9 8.9 10.2 10.1  HGB 10.9* 11.4* 11.6* 12.7  HCT 34.3* 35.9* 37.1 40.2  PLT 370 394 344 284    COAGS: Recent Labs    06/18/17 1351 06/21/17 0609 08/31/17 1032  INR 1.04 1.04 1.10  APTT 30  --  32    BMP: Recent Labs    06/20/17 0526 06/21/17 0609 08/03/17 0908 11/14/17 1357 05/02/18 1316  NA 138 138 142 141  --   K 4.6 4.2 3.8 3.8  --   CL 106 105 107 108  --   CO2 25 23 26 25   --   GLUCOSE 96 92 92 90  --   BUN 5* 5* 8 14 9   CALCIUM 8.4* 8.3* 9.3 9.0  --   CREATININE 0.88 0.79 0.85 0.89 1.00  GFRNONAA >60 >60 >60 59* 53*  GFRAA >60 >60 >60 >60 >60    LIVER FUNCTION TESTS: Recent Labs    06/02/17 1314 06/18/17 1351 06/20/17 0526 11/14/17 1357  BILITOT 0.4 0.6 0.4 0.4  AST 25 23 20 20   ALT 13* 12* 12* 13  ALKPHOS 91 61 56 94  PROT 7.9 6.0* 5.6* 7.2  ALBUMIN 3.5 2.8* 2.6* 3.7    TUMOR MARKERS: No results for input(s): AFPTM, CEA, CA199, CHROMGRNA in the last 8760 hours.  Assessment and Plan:  [name] [name] is an 84 yo female with 3 thoracic compression fractures of T9, T10, T12.    These appear to be benign fractures of osteoporosis, as there is no MRI evidence of pathology fracture.    I had a lengthy discussion with her and her husband about the principles of treating these fractures, including  the historical methods of observation/conservative management, and our current  capability with vertebral augmentation.   I was specific with my discussion about selecting patients for intervention, as we want to make sure that the benefit outweighs the risk.  Risks involved with augmentation include: bleeding, infection, need for further procedure/surgery, embolization, nerve injury, anesthesia risk, cardiopulmonary collapse, death.   My impression is that her symptoms are intermediate, and given that she is relatively un-restricted in her daily activity without medication, I suggested conservative management at this time.  I did emphasize that we could always treat in the future if need be.  Depending on the time-frame, this may or may not need repeat MRI, which I did let them know.   They are happy with this plan.  She does not seem altogether motivated to have a procedure/surgery at this time.   Plan: - Conservative management, deferring vertebral augmentation at this time.  - They know they can always call us back to reevaluate if need be for possible treatment.  Thank you for this interesting consult.  I greatly enjoyed meeting [name] [name] and look forward to participating in their care.  A copy of this report was sent to the requesting provider on this date.  Electronically Signed: Corrie Mckusick 05/16/2018, 4:32 PM   I spent a total of  40 Minutes   in face to face in clinical consultation, greater than 50% of which was counseling/coordinating care for compression fractures of t9,.t10, t12, with possible vertebral augmentation.

## 2018-05-22 ENCOUNTER — Ambulatory Visit: Payer: Self-pay | Admitting: Radiation Oncology

## 2019-02-15 IMAGING — US US ABDOMEN COMPLETE
1 series · 14 of 25 positions shown · non-contrast
Comparison: None.

CLINICAL DATA: Nausea for 1 day.

EXAM:
ABDOMEN ULTRASOUND COMPLETE

[Series 1: us abdomen complete · 0.17mm/px · 14 of 94 slices shown]
[im 1/94]
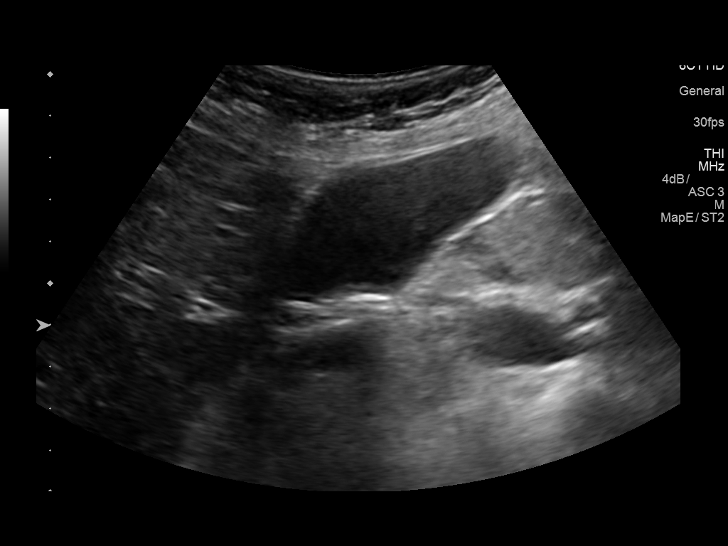
[im 8/94]
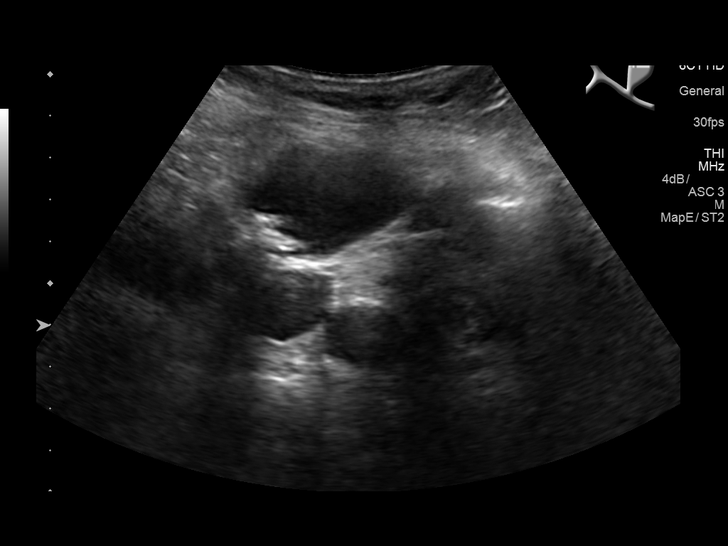
[im 16/94]
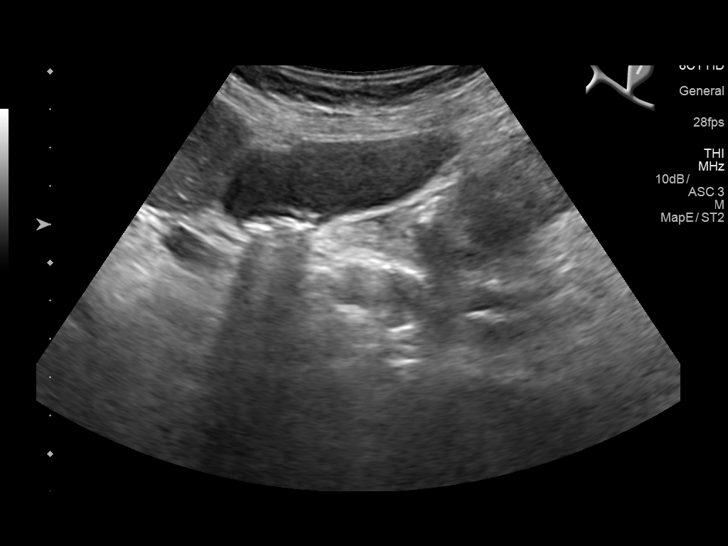
[im 24/94]
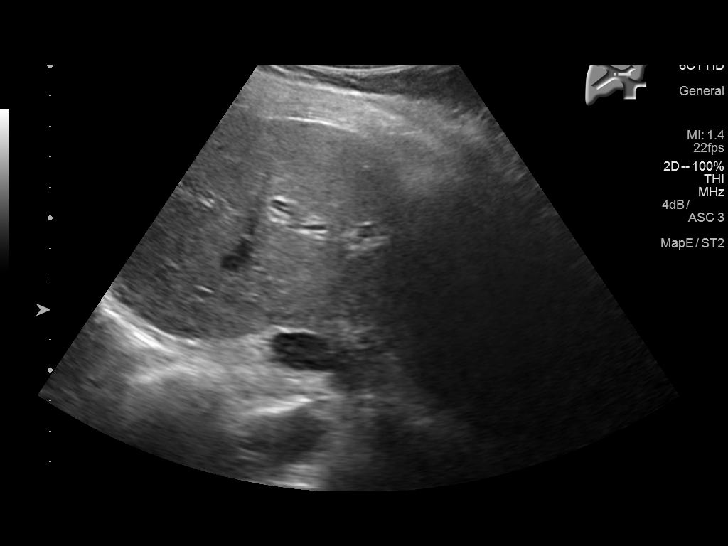
[im 32/94]
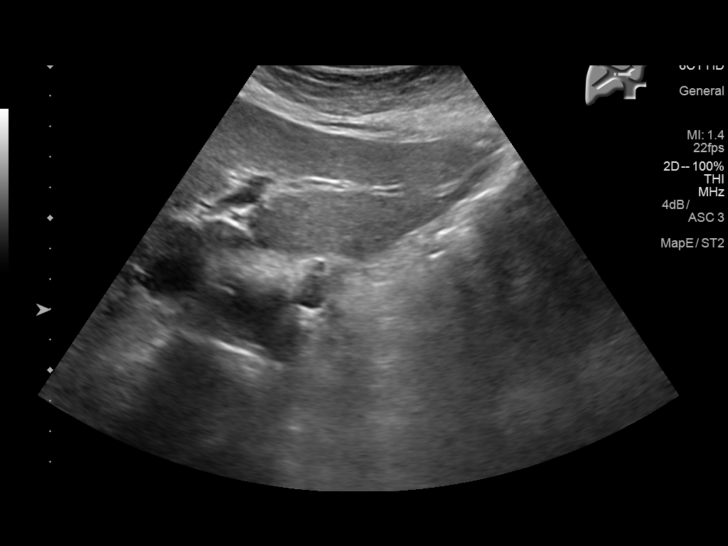
[im 35/94]
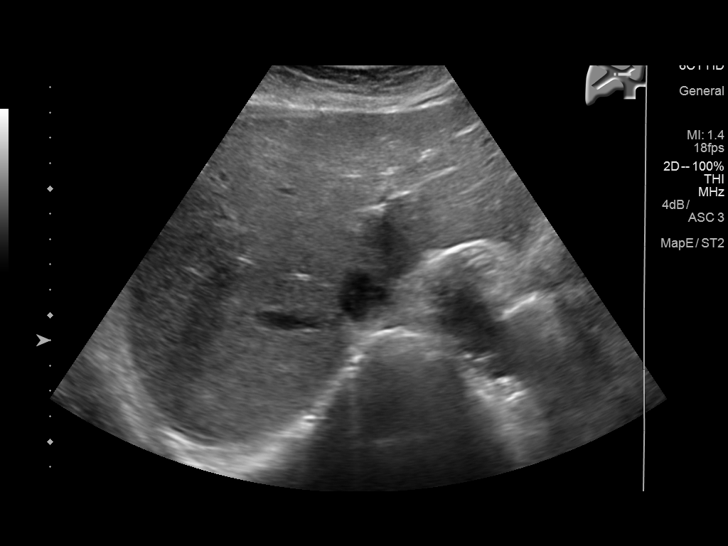
[im 43/94]
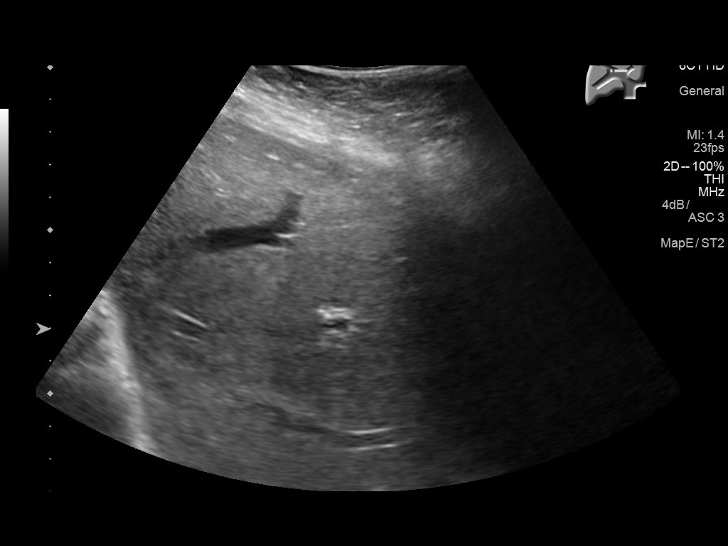
[im 51/94]
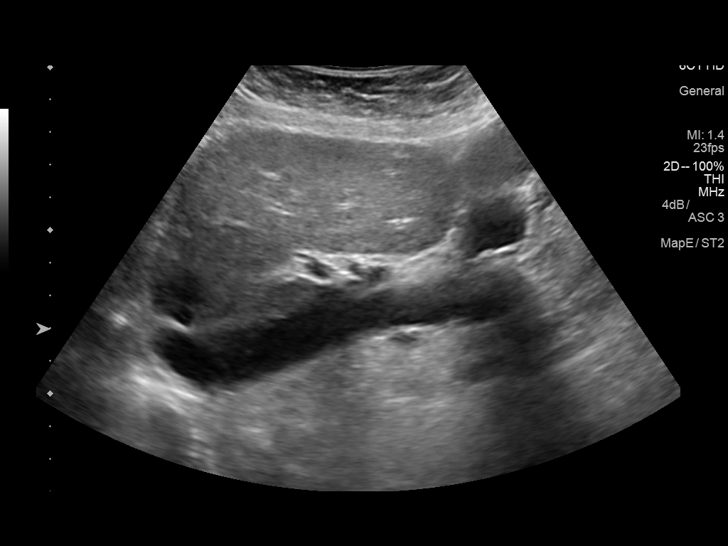
[im 59/94]
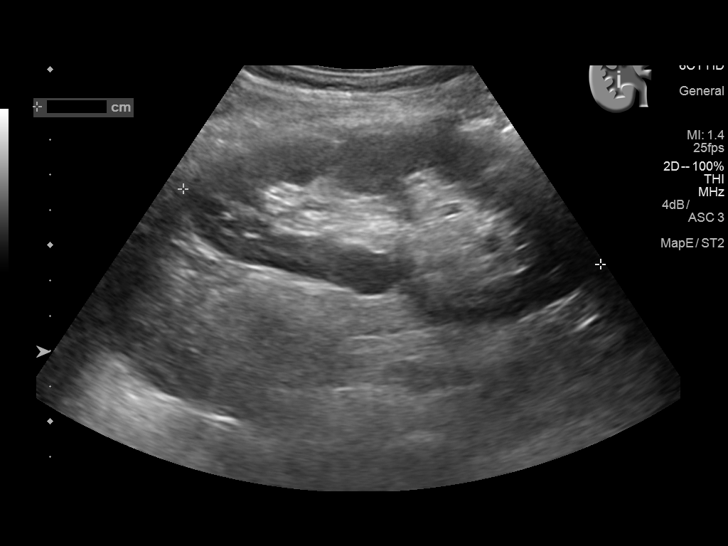
[im 63/94]
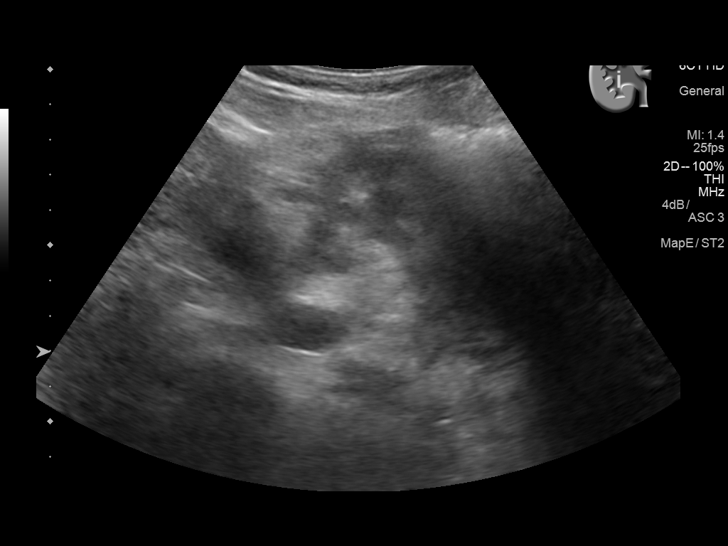
[im 70/94]
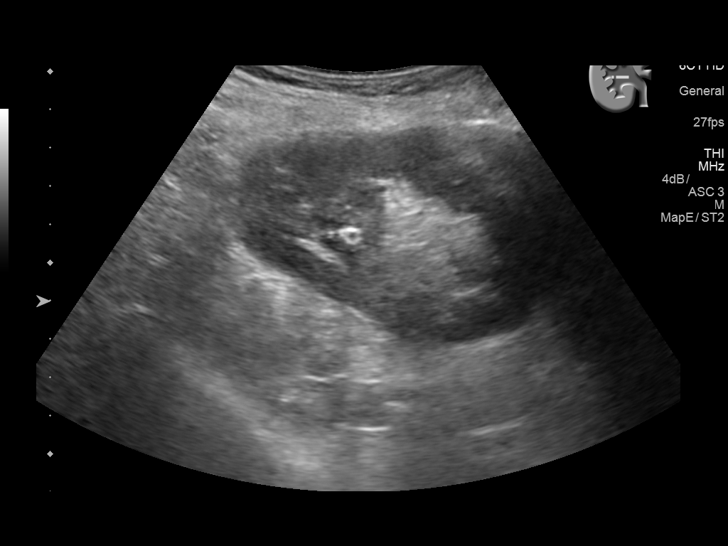
[im 78/94]
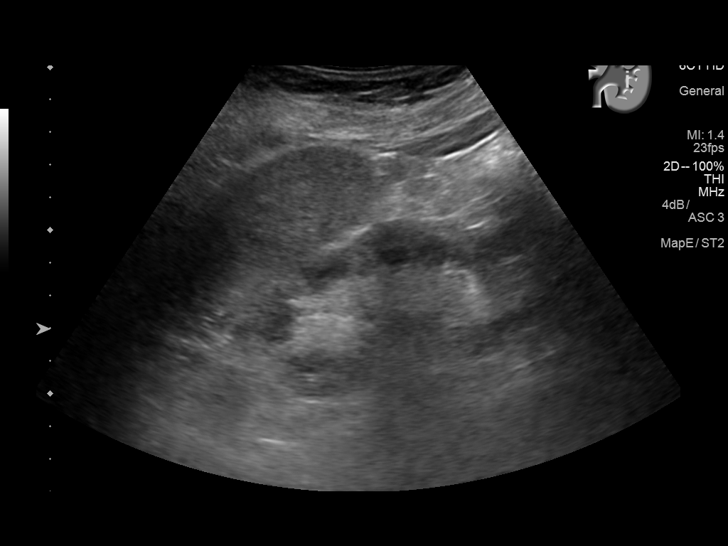
[im 86/94]
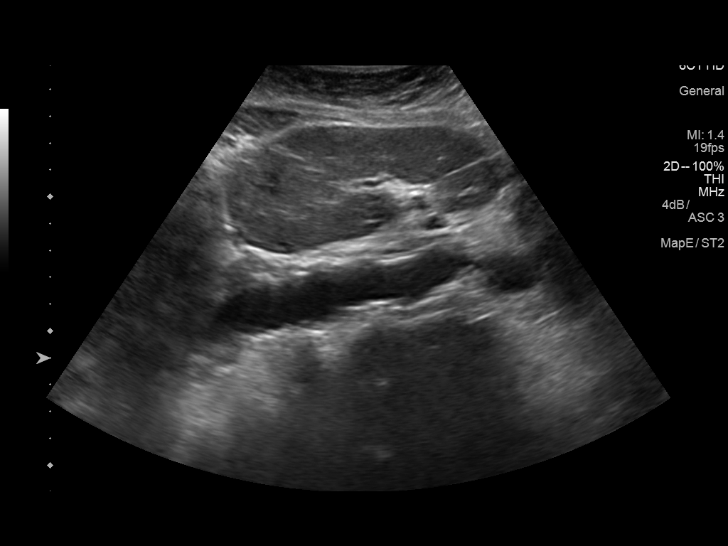
[im 94/94]
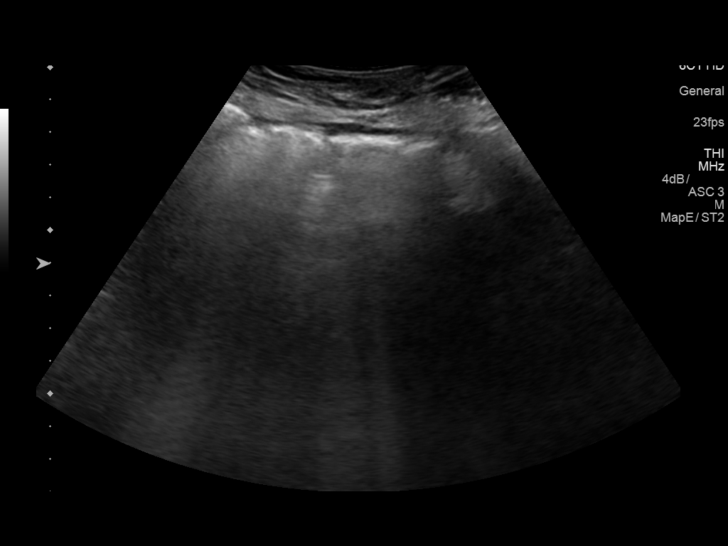

[14 of 25 positions shown; findings below may reference images not displayed]

FINDINGS: Gallbladder: Nonshadowing echogenic debris and shadowing echogenic
stones are seen within. Largest stone measures 9 mm. No wall
thickening. Negative sonographic Murphy sign.

Common bile duct: Diameter: 1 mm, within normal limits.

Liver: Diffusely increased in echogenicity. Portal vein is patent on
color Doppler imaging with normal direction of blood flow towards
the liver.

IVC: No abnormality visualized.

Pancreas: Visualized portion unremarkable.

Spleen: Size and appearance within normal limits.

Right Kidney: Length: 12.0 cm. Echogenicity within normal limits. No
mass or hydronephrosis visualized.

Left Kidney: Length: 9.4 cm. Parenchymal echogenicity is within
normal limits. Cortical thinning. No hydronephrosis. No mass.

Abdominal aorta: No aneurysm visualized.

Other findings: None.
IMPRESSION: 1. No acute findings to explain the patient's symptoms.
2. Cholelithiasis and sludge.
3. Hepatic steatosis.
4. Mild left renal atrophy.

## 2019-08-17 ENCOUNTER — Other Ambulatory Visit: Payer: Self-pay | Admitting: Family Medicine

## 2019-08-17 DIAGNOSIS — R918 Other nonspecific abnormal finding of lung field: Secondary | ICD-10-CM

## 2019-08-22 ENCOUNTER — Other Ambulatory Visit: Payer: Self-pay | Admitting: Family Medicine

## 2019-08-22 DIAGNOSIS — R413 Other amnesia: Secondary | ICD-10-CM

## 2019-08-22 DIAGNOSIS — R918 Other nonspecific abnormal finding of lung field: Secondary | ICD-10-CM

## 2019-08-22 DIAGNOSIS — Z8673 Personal history of transient ischemic attack (TIA), and cerebral infarction without residual deficits: Secondary | ICD-10-CM

## 2019-08-22 DIAGNOSIS — C3490 Malignant neoplasm of unspecified part of unspecified bronchus or lung: Secondary | ICD-10-CM

## 2019-08-23 ENCOUNTER — Other Ambulatory Visit: Payer: Self-pay | Admitting: Family Medicine

## 2019-08-23 DIAGNOSIS — R918 Other nonspecific abnormal finding of lung field: Secondary | ICD-10-CM

## 2019-08-23 DIAGNOSIS — C3490 Malignant neoplasm of unspecified part of unspecified bronchus or lung: Secondary | ICD-10-CM

## 2019-08-24 ENCOUNTER — Ambulatory Visit
Admission: RE | Admit: 2019-08-24 | Discharge: 2019-08-24 | Disposition: A | Discharge status: Home / Self Care | Payer: Medicare Other | Source: Ambulatory Visit | Attending: Family Medicine | Admitting: Family Medicine

## 2019-08-24 DIAGNOSIS — C3490 Malignant neoplasm of unspecified part of unspecified bronchus or lung: Secondary | ICD-10-CM

## 2019-08-24 DIAGNOSIS — R918 Other nonspecific abnormal finding of lung field: Secondary | ICD-10-CM

## 2019-09-08 IMAGING — DX DG CHEST 1V PORT
1 series · 1 of 1 positions shown · non-contrast
Comparison: 11/06/2014.

CLINICAL DATA: Fever with nausea and vomiting. Nonproductive cough.

EXAM:
PORTABLE CHEST 1 VIEW

[chest ap]
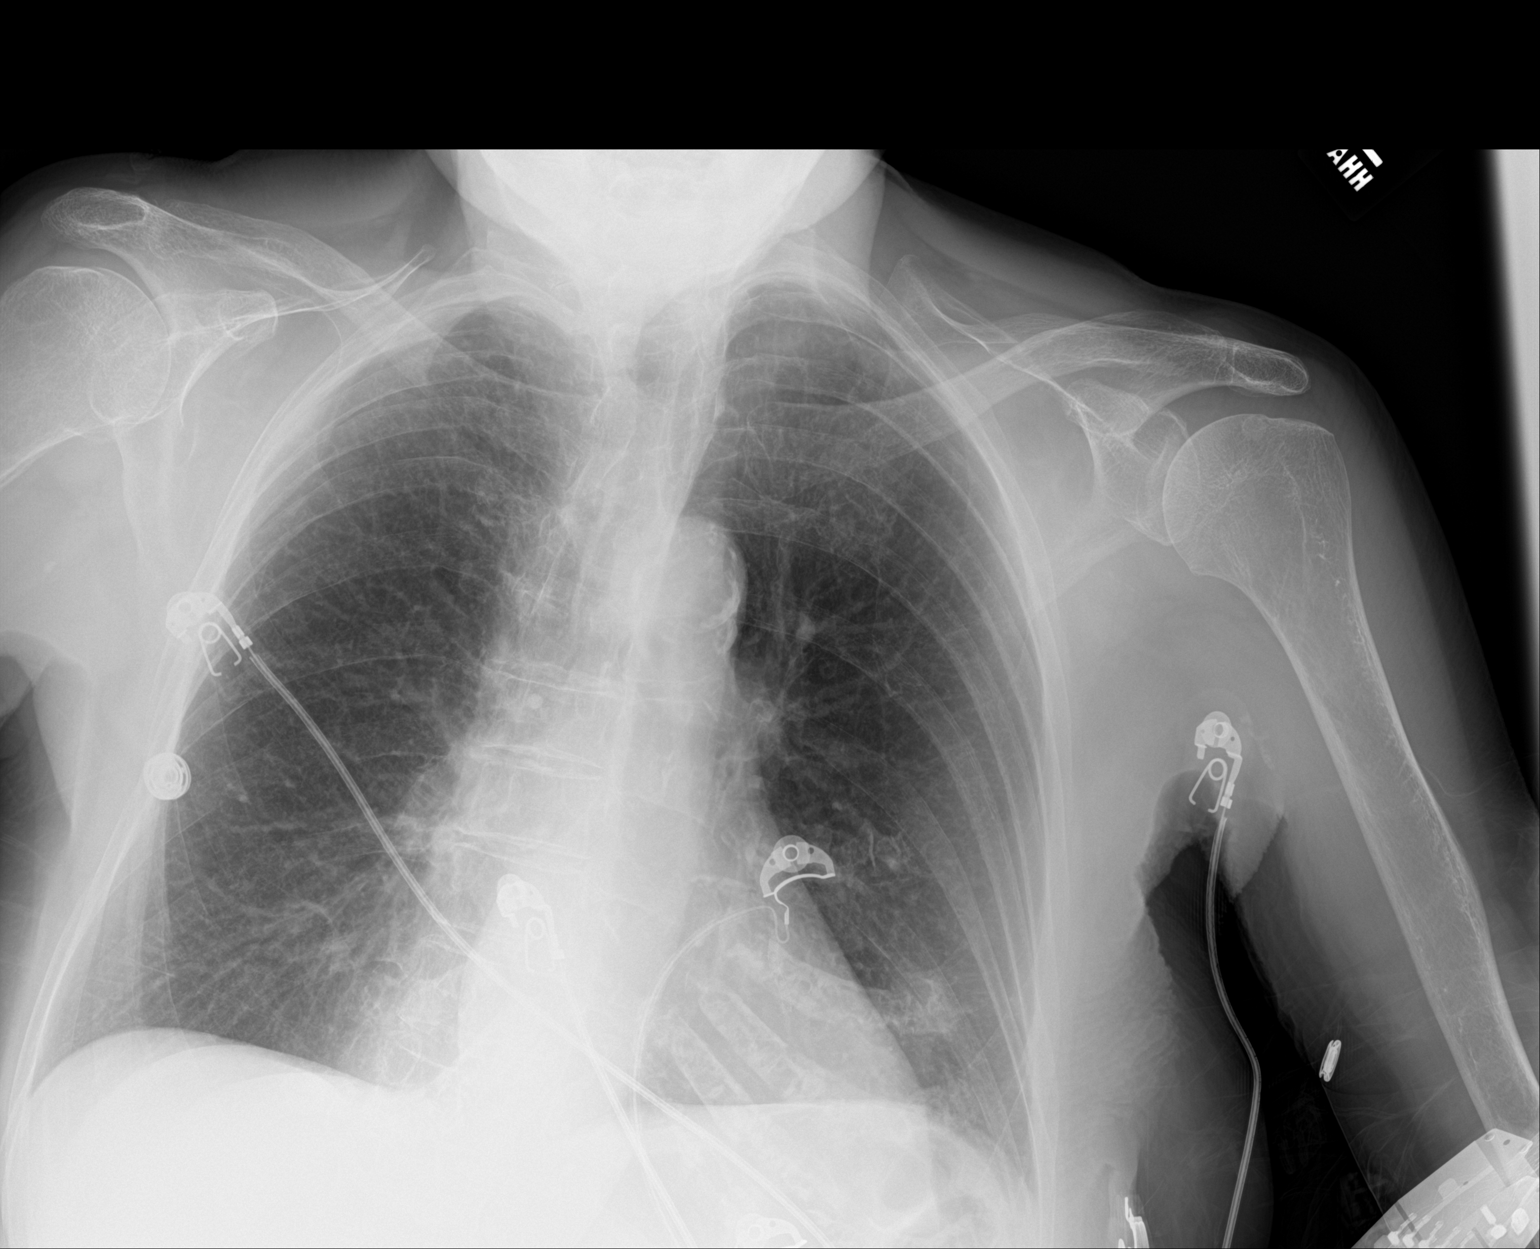

[1 of 1 positions shown; findings below may reference images not displayed]

FINDINGS: The heart size is increased, but mediastinal contours are within
normal limits. Both lungs are clear. The visualized skeletal
structures are unremarkable. Thoracic atherosclerosis.
IMPRESSION: No active disease.  Similar appearance to priors.

## 2019-09-19 ENCOUNTER — Ambulatory Visit
Admission: RE | Admit: 2019-09-19 | Discharge: 2019-09-19 | Disposition: A | Discharge status: Home / Self Care | Payer: Medicare Other | Source: Ambulatory Visit | Attending: Family Medicine | Admitting: Family Medicine

## 2019-09-19 DIAGNOSIS — R918 Other nonspecific abnormal finding of lung field: Secondary | ICD-10-CM

## 2019-09-19 DIAGNOSIS — C3490 Malignant neoplasm of unspecified part of unspecified bronchus or lung: Secondary | ICD-10-CM

## 2019-12-04 IMAGING — DX DG CHEST 1V PORT
1 series · 1 of 1 positions shown · non-contrast
Comparison: 07/28/2017

CLINICAL DATA: Status post bronchoscopy

EXAM:
PORTABLE CHEST 1 VIEW

[chest ap]
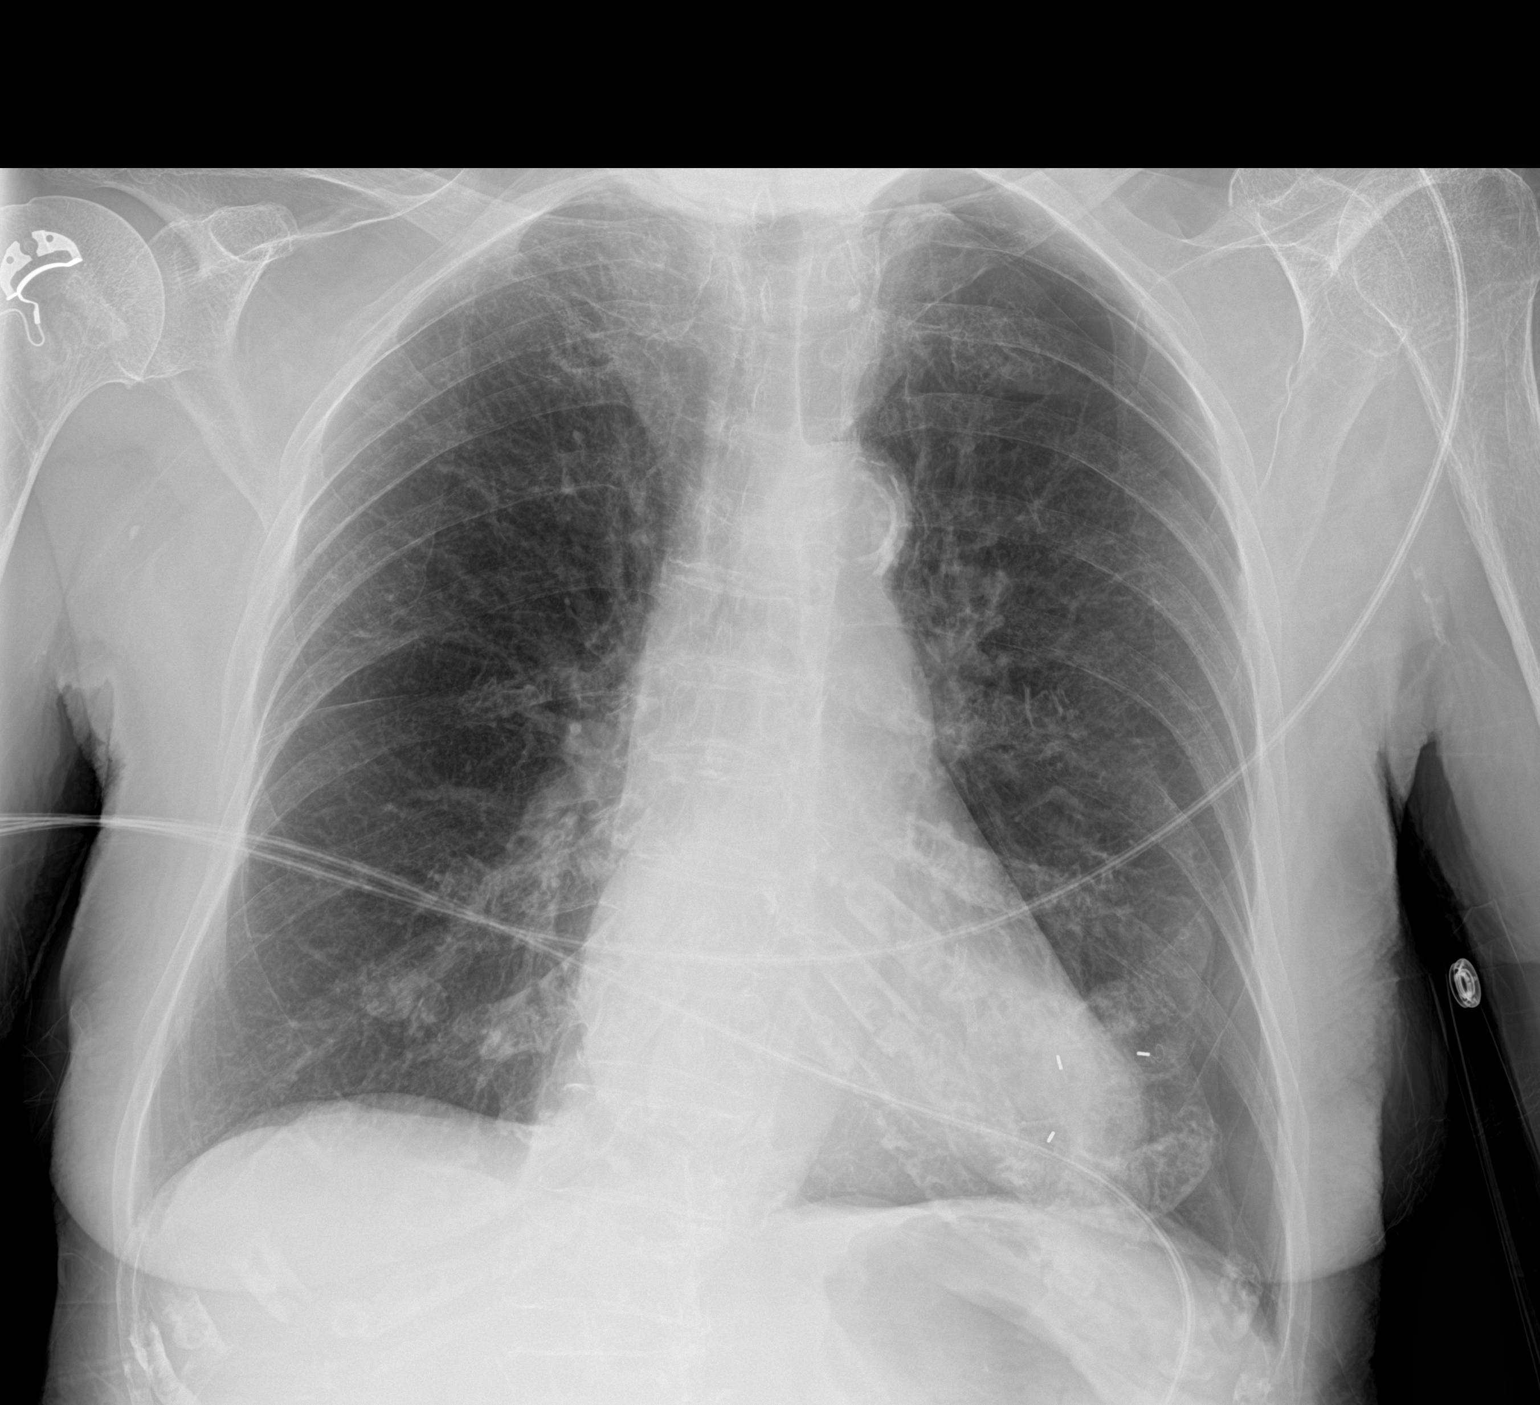

[1 of 1 positions shown; findings below may reference images not displayed]

FINDINGS: There are changes consistent with previous bronchoscopy with
fiducial markers surrounding the patient's known left lower lobe
mass. There is a mild pneumothorax identified with approximately 1
cm excursion along the apex as well as laterally in the left lung
base. No other focal abnormality is seen.
IMPRESSION: Left-sided pneumothorax with approximately 1 cm excursion as
described.

Critical Value/emergent results were called by telephone at the time
of interpretation on 08/03/2017 at [DATE] to Dr. SEOKYUNG ELDALY , who
verbally acknowledged these results.

## 2019-12-04 IMAGING — CR DG CHEST 1V PORT
1 series · 1 of 1 positions shown · non-contrast
Comparison: Radiograph of same day.

CLINICAL DATA: Left pneumothorax after biopsy.

EXAM:
PORTABLE CHEST 1 VIEW

[AP]
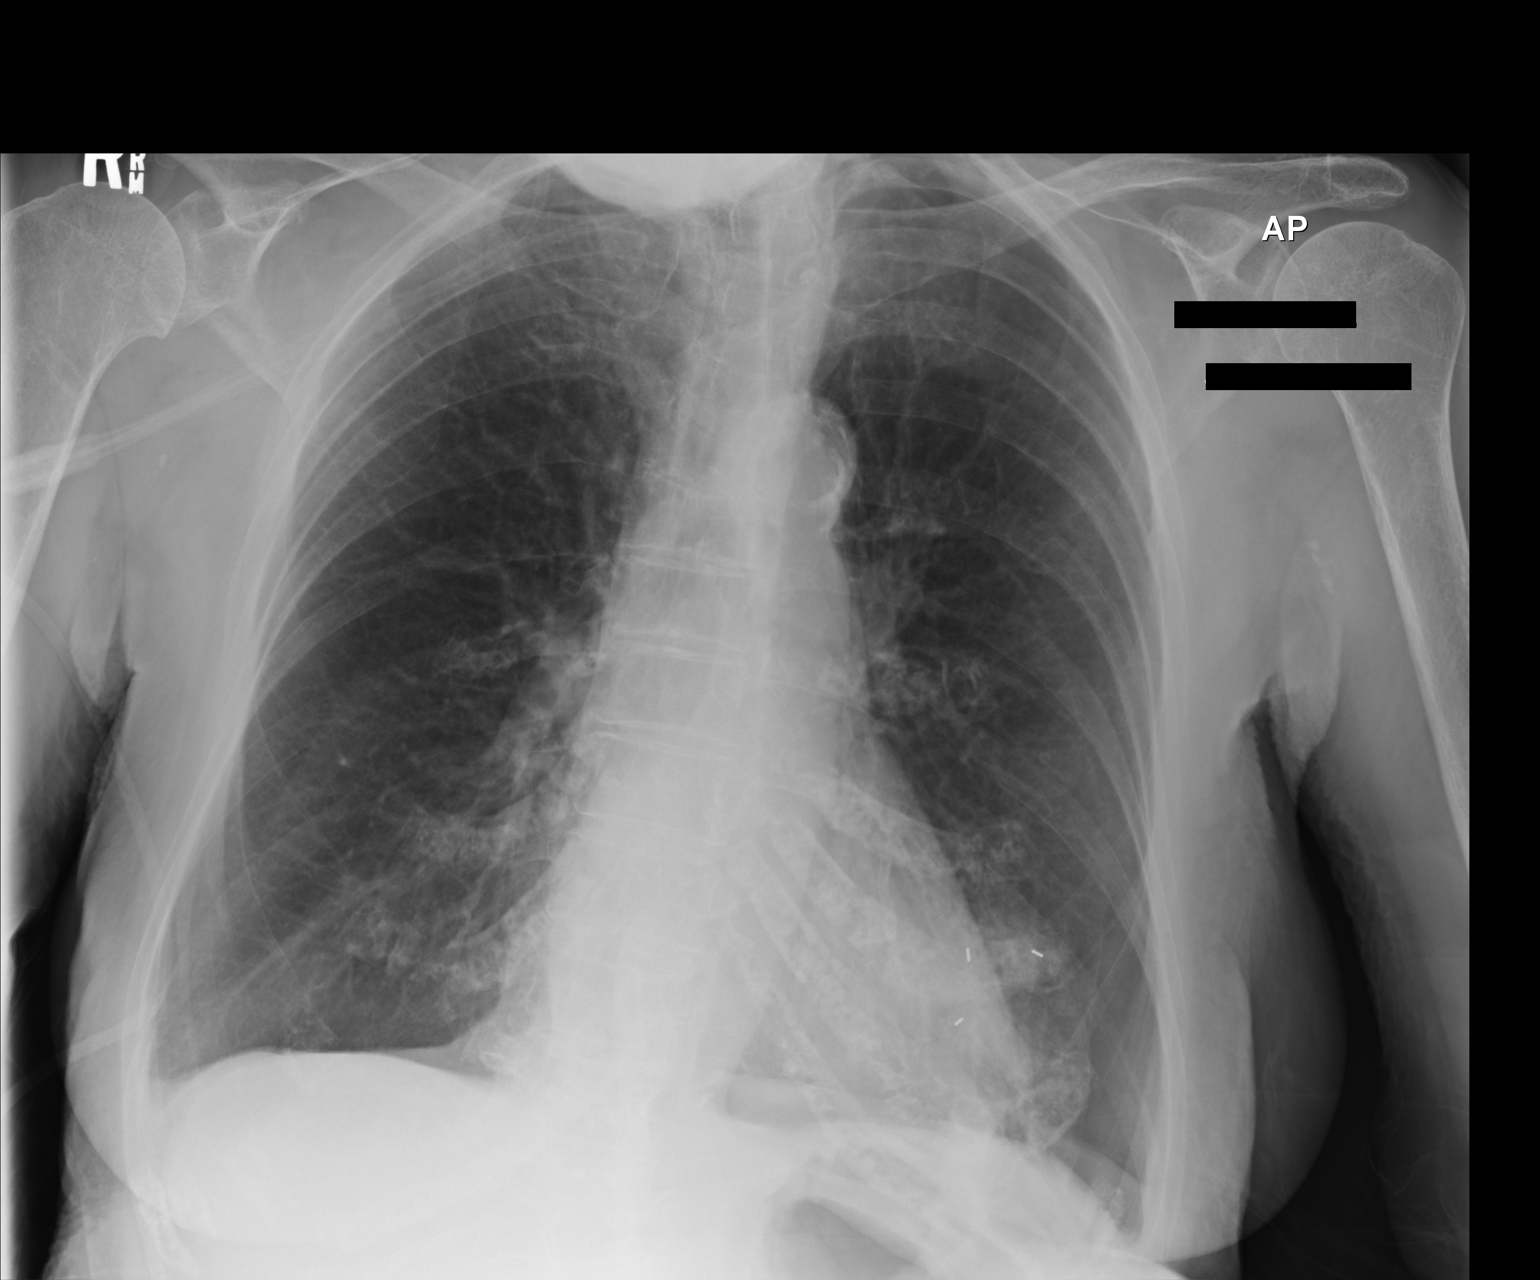

[1 of 1 positions shown; findings below may reference images not displayed]

FINDINGS: The heart size and mediastinal contours are within normal limits.
Atherosclerosis of thoracic aorta is noted. Right lung is clear.
Stable mild left apical and basilar pneumothorax is noted compared
to prior exam.. The visualized skeletal structures are unremarkable.
IMPRESSION: Stable mild left apical and basilar pneumothorax compared to prior
exam.

## 2019-12-05 IMAGING — DX DG CHEST 1V PORT
1 series · 1 of 1 positions shown · non-contrast
Comparison: 08/03/2017

CLINICAL DATA: Pneumothorax after biopsy

EXAM:
PORTABLE CHEST 1 VIEW

[chest ap]
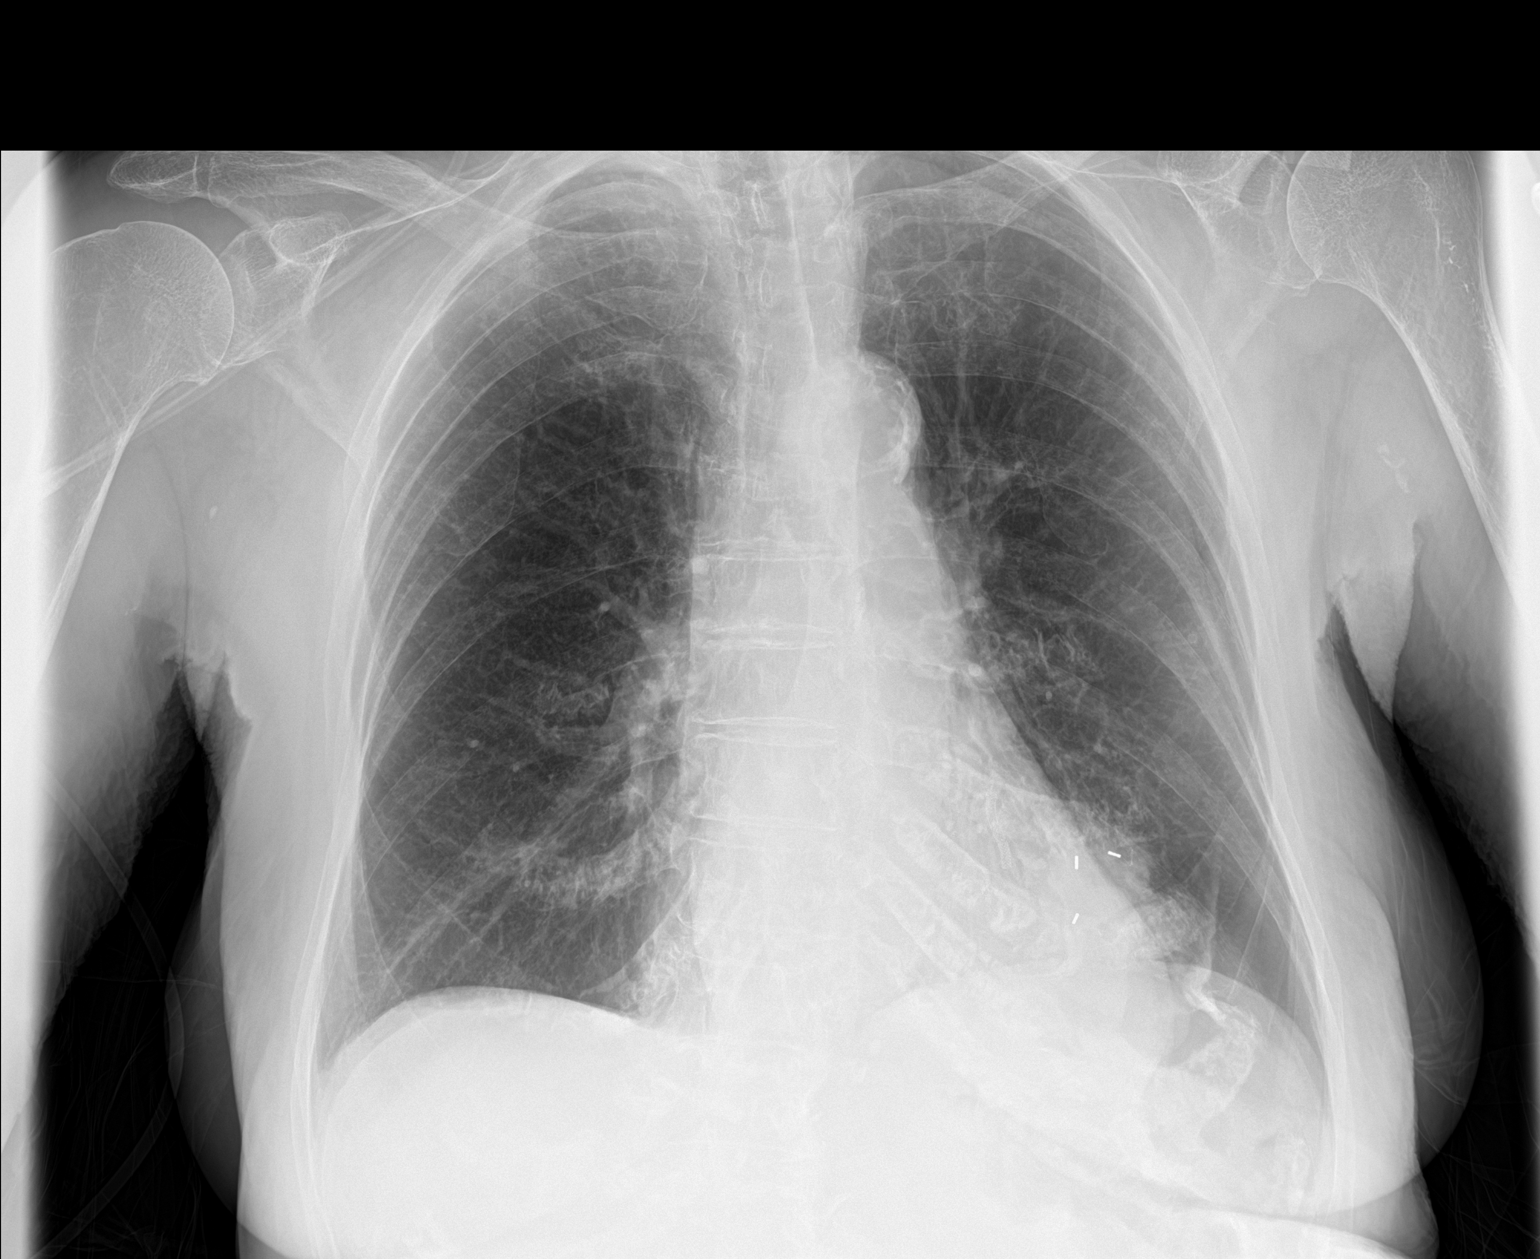

[1 of 1 positions shown; findings below may reference images not displayed]

FINDINGS: Left basilar and apical pneumothorax again noted, unchanged. Left
base atelectasis. Right lung clear. Heart is normal size.
IMPRESSION: Stable left apical and basilar pneumothorax.

## 2020-10-23 ENCOUNTER — Other Ambulatory Visit: Payer: Self-pay | Admitting: Family Medicine

## 2020-10-23 ENCOUNTER — Ambulatory Visit
Admission: RE | Admit: 2020-10-23 | Discharge: 2020-10-23 | Disposition: A | Discharge status: Home / Self Care | Payer: Medicare Other | Source: Ambulatory Visit | Attending: Family Medicine | Admitting: Family Medicine

## 2020-10-23 ENCOUNTER — Other Ambulatory Visit: Payer: Self-pay

## 2020-10-23 DIAGNOSIS — M5440 Lumbago with sciatica, unspecified side: Secondary | ICD-10-CM

## 2021-05-12 ENCOUNTER — Encounter (HOSPITAL_COMMUNITY): Payer: Self-pay | Admitting: Emergency Medicine

## 2021-05-12 ENCOUNTER — Emergency Department (HOSPITAL_COMMUNITY): Payer: Medicare Other

## 2021-05-12 ENCOUNTER — Other Ambulatory Visit: Payer: Self-pay

## 2021-05-12 ENCOUNTER — Inpatient Hospital Stay (HOSPITAL_COMMUNITY)
Admission: EM | Admit: 2021-05-12 | Discharge: 2021-05-19 | DRG: 469 | Disposition: A | Discharge status: Skilled Nursing Facility | Payer: Medicare Other | Attending: Internal Medicine | Admitting: Internal Medicine

## 2021-05-12 DIAGNOSIS — Z8673 Personal history of transient ischemic attack (TIA), and cerebral infarction without residual deficits: Secondary | ICD-10-CM

## 2021-05-12 DIAGNOSIS — Y92511 Restaurant or cafe as the place of occurrence of the external cause: Secondary | ICD-10-CM

## 2021-05-12 DIAGNOSIS — Z8249 Family history of ischemic heart disease and other diseases of the circulatory system: Secondary | ICD-10-CM

## 2021-05-12 DIAGNOSIS — S4291XA Fracture of right shoulder girdle, part unspecified, initial encounter for closed fracture: Secondary | ICD-10-CM | POA: Diagnosis present

## 2021-05-12 DIAGNOSIS — Z87891 Personal history of nicotine dependence: Secondary | ICD-10-CM

## 2021-05-12 DIAGNOSIS — I2510 Atherosclerotic heart disease of native coronary artery without angina pectoris: Secondary | ICD-10-CM | POA: Diagnosis present

## 2021-05-12 DIAGNOSIS — W010XXA Fall on same level from slipping, tripping and stumbling without subsequent striking against object, initial encounter: Secondary | ICD-10-CM | POA: Diagnosis present

## 2021-05-12 DIAGNOSIS — D62 Acute posthemorrhagic anemia: Secondary | ICD-10-CM | POA: Diagnosis not present

## 2021-05-12 DIAGNOSIS — Z681 Body mass index (BMI) 19 or less, adult: Secondary | ICD-10-CM

## 2021-05-12 DIAGNOSIS — M479 Spondylosis, unspecified: Secondary | ICD-10-CM | POA: Diagnosis present

## 2021-05-12 DIAGNOSIS — Z9889 Other specified postprocedural states: Secondary | ICD-10-CM

## 2021-05-12 DIAGNOSIS — Z20822 Contact with and (suspected) exposure to covid-19: Secondary | ICD-10-CM | POA: Diagnosis present

## 2021-05-12 DIAGNOSIS — Z85118 Personal history of other malignant neoplasm of bronchus and lung: Secondary | ICD-10-CM

## 2021-05-12 DIAGNOSIS — Z923 Personal history of irradiation: Secondary | ICD-10-CM

## 2021-05-12 DIAGNOSIS — S42251A Displaced fracture of greater tuberosity of right humerus, initial encounter for closed fracture: Secondary | ICD-10-CM | POA: Diagnosis present

## 2021-05-12 DIAGNOSIS — S42211A Unspecified displaced fracture of surgical neck of right humerus, initial encounter for closed fracture: Principal | ICD-10-CM | POA: Diagnosis present

## 2021-05-12 DIAGNOSIS — S72011A Unspecified intracapsular fracture of right femur, initial encounter for closed fracture: Secondary | ICD-10-CM | POA: Diagnosis present

## 2021-05-12 DIAGNOSIS — C3432 Malignant neoplasm of lower lobe, left bronchus or lung: Secondary | ICD-10-CM | POA: Diagnosis present

## 2021-05-12 DIAGNOSIS — W19XXXA Unspecified fall, initial encounter: Secondary | ICD-10-CM

## 2021-05-12 DIAGNOSIS — Z955 Presence of coronary angioplasty implant and graft: Secondary | ICD-10-CM

## 2021-05-12 DIAGNOSIS — Z79899 Other long term (current) drug therapy: Secondary | ICD-10-CM

## 2021-05-12 DIAGNOSIS — I10 Essential (primary) hypertension: Secondary | ICD-10-CM | POA: Diagnosis present

## 2021-05-12 DIAGNOSIS — E440 Moderate protein-calorie malnutrition: Secondary | ICD-10-CM | POA: Insufficient documentation

## 2021-05-12 DIAGNOSIS — Z7982 Long term (current) use of aspirin: Secondary | ICD-10-CM

## 2021-05-12 DIAGNOSIS — E7800 Pure hypercholesterolemia, unspecified: Secondary | ICD-10-CM | POA: Diagnosis present

## 2021-05-12 DIAGNOSIS — D72829 Elevated white blood cell count, unspecified: Secondary | ICD-10-CM | POA: Diagnosis not present

## 2021-05-12 DIAGNOSIS — S72001A Fracture of unspecified part of neck of right femur, initial encounter for closed fracture: Secondary | ICD-10-CM | POA: Diagnosis present

## 2021-05-12 DIAGNOSIS — S42201A Unspecified fracture of upper end of right humerus, initial encounter for closed fracture: Secondary | ICD-10-CM

## 2021-05-12 DIAGNOSIS — Z09 Encounter for follow-up examination after completed treatment for conditions other than malignant neoplasm: Secondary | ICD-10-CM

## 2021-05-12 DIAGNOSIS — S79911A Unspecified injury of right hip, initial encounter: Secondary | ICD-10-CM | POA: Diagnosis not present

## 2021-05-12 LAB — CBC WITH DIFFERENTIAL/PLATELET
Abs Immature Granulocytes: 0.13 10*3/uL — ABNORMAL HIGH (ref 0.00–0.07)
Basophils Absolute: 0.1 10*3/uL (ref 0.0–0.1)
Basophils Relative: 0 %
Eosinophils Absolute: 0 10*3/uL (ref 0.0–0.5)
Eosinophils Relative: 0 %
HCT: 38.3 % (ref 36.0–46.0)
Hemoglobin: 12.6 g/dL (ref 12.0–15.0)
Immature Granulocytes: 1 %
Lymphocytes Relative: 5 %
Lymphs Abs: 1.1 10*3/uL (ref 0.7–4.0)
MCH: 29.5 pg (ref 26.0–34.0)
MCHC: 32.9 g/dL (ref 30.0–36.0)
MCV: 89.7 fL (ref 80.0–100.0)
Monocytes Absolute: 1.7 10*3/uL — ABNORMAL HIGH (ref 0.1–1.0)
Monocytes Relative: 8 %
Neutro Abs: 19.3 10*3/uL — ABNORMAL HIGH (ref 1.7–7.7)
Neutrophils Relative %: 86 %
Platelets: 256 10*3/uL (ref 150–400)
RBC: 4.27 MIL/uL (ref 3.87–5.11)
RDW: 13.4 % (ref 11.5–15.5)
WBC: 22.4 10*3/uL — ABNORMAL HIGH (ref 4.0–10.5)
nRBC: 0 % (ref 0.0–0.2)

## 2021-05-12 LAB — PROTIME-INR
INR: 1.1 (ref 0.8–1.2)
Prothrombin Time: 14.7 seconds (ref 11.4–15.2)

## 2021-05-12 LAB — BASIC METABOLIC PANEL
Anion gap: 9 (ref 5–15)
BUN: 11 mg/dL (ref 8–23)
CO2: 24 mmol/L (ref 22–32)
Calcium: 8.8 mg/dL — ABNORMAL LOW (ref 8.9–10.3)
Chloride: 104 mmol/L (ref 98–111)
Creatinine, Ser: 0.83 mg/dL (ref 0.44–1.00)
GFR, Estimated: >60 mL/min (ref >60)
Glucose, Bld: 168 mg/dL — ABNORMAL HIGH (ref 70–99)
Potassium: 3.7 mmol/L (ref 3.5–5.1)
Sodium: 137 mmol/L (ref 135–145)

## 2021-05-12 MED ORDER — MORPHINE SULFATE (PF) 4 MG/ML IV SOLN
4.0000 mg | INTRAVENOUS | Status: AC | PRN
  Administered 2021-05-12 – 2021-05-13 (×2): 4 mg via INTRAVENOUS
  Filled 2021-05-12 (×2): qty 1

## 2021-05-12 MED ORDER — ONDANSETRON HCL 4 MG/2ML IJ SOLN
4.0000 mg | Freq: Once | INTRAMUSCULAR | Status: AC
  Administered 2021-05-12: 4 mg via INTRAVENOUS
  Filled 2021-05-12: qty 2

## 2021-05-12 MED ORDER — SODIUM CHLORIDE 0.9 % IV SOLN: INTRAVENOUS | Status: DC

## 2021-05-12 NOTE — ED Provider Notes (Signed)
[place] DEPT Provider Note   CSN: [id number] Arrival date & time: 05/12/21  2121     History  Chief Complaint  Patient presents with   Fall    Mechanical   Hip Pain    [name] [name] is a 87 y.o. female.   Fall Pertinent negatives include no chest pain.  Hip Pain Pertinent negatives include no chest pain.   Patient presented to the ED for evaluation after a fall.  Patient states she was at home trying to get out of the car and she ended up tripping on the curb.  Landed on her right side.  She is having pain in her shoulder as well as her right hip.  Patient was not able to stand and EMS was called.  She was given fentanyl in route  She had some nausea and vomiting after the fall.  No fevers or chills  Home Medications Prior to Admission medications   Medication Sig Start Date End Date Taking? Authorizing Provider  aspirin 81 MG tablet Take 1 tablet (81 mg total) by mouth 3 (three) times a week. 08/05/17   Donita Brooks, NP  Calcium Citrate (CITRACAL PO) Take 2 tablets by mouth daily.    [provider]  EPINEPHrine (EPIPEN 2-PAK) 0.3 mg/0.3 mL IJ SOAJ injection Inject 0.3 mg into the muscle once as needed (for anaphylaxis).    [provider]  felodipine (PLENDIL) 10 MG 24 hr tablet Take 10 mg by mouth daily.      [provider]  Multiple Vitamin (MULTIVITAMIN WITH MINERALS) TABS tablet Take 1 tablet by mouth daily. 05/11/17   Georgette Shell, MD  nitroGLYCERIN (NITROSTAT) 0.4 MG SL tablet Place 1 tablet (0.4 mg total) under the tongue every 5 (five) minutes as needed for chest pain. 07/10/14   Josue Hector, MD      Allergies    Other    Review of Systems   Review of Systems  Constitutional:  Negative for fever.  Respiratory:  Negative for cough.   Cardiovascular:  Negative for chest pain.   Physical Exam Updated Vital Signs BP (!) 154/77 (BP Location: Left Arm)    Pulse (!) 109    Temp 97.7 F (36.5 C)  (Oral)    Resp 18    SpO2 90%  Physical Exam Vitals and nursing note reviewed.  Constitutional:      General: She is in acute distress.     Appearance: She is well-developed. She is ill-appearing.  HENT:     Head: Normocephalic and atraumatic.     Right Ear: External ear normal.     Left Ear: External ear normal.  Eyes:     General: No scleral icterus.       Right eye: No discharge.        Left eye: No discharge.     Conjunctiva/sclera: Conjunctivae normal.  Neck:     Trachea: No tracheal deviation.  Cardiovascular:     Rate and Rhythm: Normal rate and regular rhythm.  Pulmonary:     Effort: Pulmonary effort is normal. No respiratory distress.     Breath sounds: Normal breath sounds. No stridor. No wheezing or rales.  Abdominal:     General: Bowel sounds are normal. There is no distension.     Palpations: Abdomen is soft.     Tenderness: There is no abdominal tenderness. There is no guarding or rebound.  Musculoskeletal:        General: Swelling  and tenderness present. No deformity.     Right shoulder: Swelling and effusion present.     Cervical back: Neck supple.     Right hip: Tenderness present. Decreased range of motion.     Comments: Clinically right shoulder does not appear dislocated, significant swelling noted  Skin:    General: Skin is warm and dry.     Findings: No rash.  Neurological:     General: No focal deficit present.     Mental Status: She is alert.     Cranial Nerves: No cranial nerve deficit (no facial droop, extraocular movements intact, no slurred speech).     Sensory: No sensory deficit.     Motor: No abnormal muscle tone or seizure activity.     Coordination: Coordination normal.  Psychiatric:        Mood and Affect: Mood normal.    ED Results / Procedures / Treatments   Labs (all labs ordered are listed, but only abnormal results are displayed) Labs Reviewed  BASIC METABOLIC PANEL - Abnormal; Notable for the following components:      Result  Value   Glucose, Bld 168 (*)    Calcium 8.8 (*)    All other components within normal limits  CBC WITH DIFFERENTIAL/PLATELET - Abnormal; Notable for the following components:   WBC 22.4 (*)    Neutro Abs 19.3 (*)    Monocytes Absolute 1.7 (*)    Abs Immature Granulocytes 0.13 (*)    All other components within normal limits  PROTIME-INR  TYPE AND SCREEN    EKG EKG Interpretation  Date/Time:  Tuesday May 12 2021 23:27:33 EST Ventricular Rate:  106 PR Interval:  162 QRS Duration: 80 QT Interval:  364 QTC Calculation: 483 R Axis:   47 Text Interpretation: Sinus tachycardia Inferior infarct , age undetermined Cannot rule out Anterior infarct , age undetermined Abnormal ECG When compared with ECG of 03-Aug-2017 19:02, inferior q waves more prominent Confirmed by Dorie Rank [phone]) on 05/12/2021 11:32:21 PM  Radiology DG Shoulder Right  Result Date: 05/12/2021 CLINICAL DATA:  Tripped and fell, pain EXAM: RIGHT SHOULDER - 2+ VIEW COMPARISON:  None. FINDINGS: Frontal and transscapular views of the right shoulder are obtained. There is anterior dislocation of the glenohumeral joint. Abnormal angulation of the humeral neck consistent with an impacted fracture. No other acute bony abnormalities. Right chest is clear. IMPRESSION: 1. Impacted right humeral neck fracture. 2. Anterior glenohumeral dislocation. Electronically Signed   By: Randa Ngo M.D.   On: 05/12/2021 22:23   DG Hip Unilat  With Pelvis 2-3 Views Right  Result Date: 05/12/2021 CLINICAL DATA:  Tripped and fell, right hip pain EXAM: DG HIP (WITH OR WITHOUT PELVIS) 2-3V RIGHT COMPARISON:  None. FINDINGS: Frontal view of the pelvis as well as frontal and cross-table lateral views of the right hip are obtained. There is an impacted subcapital right femoral neck fracture with varus angulation at the fracture site. No dislocation. Symmetrical bilateral hip joint space narrowing. Diffuse vascular calcifications. IMPRESSION: 1.  Impacted subcapital right femoral neck fracture with varus angulation at the fracture site. Electronically Signed   By: Randa Ngo M.D.   On: 05/12/2021 22:24    Procedures Procedures    Medications Ordered in ED Medications  0.9 %  sodium chloride infusion ( Intravenous New Bag/Given 05/12/21 2312)  morphine (PF) 4 MG/ML injection 4 mg (4 mg Intravenous Given 05/12/21 2311)  ondansetron (ZOFRAN) injection 4 mg (4 mg Intravenous Given 05/12/21 2312)  ED Course/ Medical Decision Making/ A&P Clinical Course as of 05/12/21 2344  Tue May 12, 2021  2242 Hip x-ray shows a subcapital right femur fracture.  Shoulder x-ray shows impacted right humeral neck fracture and dislocation.  Clinically shoulder does not appear dislocated.  We will CT to confirm [JK]  2248 Discussed with Dr. Zachery Dakins  He will consult on patient [JK]  2339 CBC shows elevated white blood cell count. [JK]  0174 Metabolic panel does not show any acute abnormalities. [JK]    Clinical Course User Index [JK] Dorie Rank, MD                           Medical Decision Making Amount and/or Complexity of Data Reviewed Labs: ordered. Radiology: ordered.  Risk Prescription drug management.   Patient presented to the ED after a mechanical fall.  X-rays show evidence of hip fracture and proximal humerus fracture.  Plain films suggest dislocation of the shoulder although clinically shoulder appears located.  We will proceed with CT scan to confirm.  We will also CT her head and neck considering her fall and injuries.  Patient has been treated with IV narcotic pain medications.  She will need admission to the hospital.  Consulted with orthopedics and will consult with medical service once her CT scans are reviewed.        Final Clinical Impression(s) / ED Diagnoses Final diagnoses:  Closed fracture of proximal end of right humerus, unspecified fracture morphology, initial encounter  Closed fracture of neck of right femur,  initial encounter ([place])     Dorie Rank, MD 05/12/21 2344

## 2021-05-12 NOTE — Progress Notes (Signed)
Patient with R displaced femoral neck fracture. Anticipate OR for hemiarthroplasty vs total hip arthroplasty possible Wednesday pending medical clearance and OR availability. Also with R proximal humerus fx, possible humeral head dislocation. Plan TBD pending CT scan. Full consult note to follow.

## 2021-05-12 NOTE — ED Triage Notes (Addendum)
Patient arrives via EMS complaining of right hip pain s/p mechanical fall at home. Patient got out of the car and tripped on the crib her husband. Patient fell back and landed on her buttocks. No LOC or head injury. Patient was able to get in the car and get home. Patient was on the couch with extreme right hip pain. Shortening and rotation noted. Patient also has right shoulder pain  20g LFA 150 mcg fentanyl (x3 doses of 50 mcg)

## 2021-05-12 NOTE — ED Notes (Signed)
Patient transported to X-ray 

## 2021-05-13 ENCOUNTER — Other Ambulatory Visit: Payer: Self-pay

## 2021-05-13 ENCOUNTER — Encounter (HOSPITAL_COMMUNITY): Payer: Self-pay | Admitting: Internal Medicine

## 2021-05-13 ENCOUNTER — Inpatient Hospital Stay (HOSPITAL_COMMUNITY): Payer: Medicare Other | Admitting: Anesthesiology

## 2021-05-13 ENCOUNTER — Encounter (HOSPITAL_COMMUNITY)
Admission: EM | Disposition: A | Discharge status: Skilled Nursing Facility | Payer: Self-pay | Source: Home / Self Care | Attending: Internal Medicine

## 2021-05-13 ENCOUNTER — Inpatient Hospital Stay (HOSPITAL_COMMUNITY): Payer: Medicare Other

## 2021-05-13 ENCOUNTER — Emergency Department (HOSPITAL_COMMUNITY): Payer: Medicare Other

## 2021-05-13 DIAGNOSIS — Z8673 Personal history of transient ischemic attack (TIA), and cerebral infarction without residual deficits: Secondary | ICD-10-CM | POA: Diagnosis not present

## 2021-05-13 DIAGNOSIS — Z955 Presence of coronary angioplasty implant and graft: Secondary | ICD-10-CM | POA: Diagnosis not present

## 2021-05-13 DIAGNOSIS — I25119 Atherosclerotic heart disease of native coronary artery with unspecified angina pectoris: Secondary | ICD-10-CM | POA: Diagnosis not present

## 2021-05-13 DIAGNOSIS — S42211A Unspecified displaced fracture of surgical neck of right humerus, initial encounter for closed fracture: Secondary | ICD-10-CM | POA: Diagnosis present

## 2021-05-13 DIAGNOSIS — S72011A Unspecified intracapsular fracture of right femur, initial encounter for closed fracture: Secondary | ICD-10-CM | POA: Diagnosis present

## 2021-05-13 DIAGNOSIS — Z85118 Personal history of other malignant neoplasm of bronchus and lung: Secondary | ICD-10-CM | POA: Diagnosis not present

## 2021-05-13 DIAGNOSIS — Z01810 Encounter for preprocedural cardiovascular examination: Secondary | ICD-10-CM

## 2021-05-13 DIAGNOSIS — S79911A Unspecified injury of right hip, initial encounter: Secondary | ICD-10-CM | POA: Diagnosis present

## 2021-05-13 DIAGNOSIS — S4291XA Fracture of right shoulder girdle, part unspecified, initial encounter for closed fracture: Secondary | ICD-10-CM | POA: Diagnosis not present

## 2021-05-13 DIAGNOSIS — Z87891 Personal history of nicotine dependence: Secondary | ICD-10-CM | POA: Diagnosis not present

## 2021-05-13 DIAGNOSIS — S72001A Fracture of unspecified part of neck of right femur, initial encounter for closed fracture: Secondary | ICD-10-CM | POA: Diagnosis not present

## 2021-05-13 DIAGNOSIS — Z20822 Contact with and (suspected) exposure to covid-19: Secondary | ICD-10-CM | POA: Diagnosis present

## 2021-05-13 DIAGNOSIS — R9431 Abnormal electrocardiogram [ECG] [EKG]: Secondary | ICD-10-CM

## 2021-05-13 DIAGNOSIS — W010XXA Fall on same level from slipping, tripping and stumbling without subsequent striking against object, initial encounter: Secondary | ICD-10-CM | POA: Diagnosis present

## 2021-05-13 DIAGNOSIS — S42251A Displaced fracture of greater tuberosity of right humerus, initial encounter for closed fracture: Secondary | ICD-10-CM | POA: Diagnosis present

## 2021-05-13 DIAGNOSIS — I10 Essential (primary) hypertension: Secondary | ICD-10-CM | POA: Diagnosis present

## 2021-05-13 DIAGNOSIS — S42201A Unspecified fracture of upper end of right humerus, initial encounter for closed fracture: Secondary | ICD-10-CM

## 2021-05-13 DIAGNOSIS — D62 Acute posthemorrhagic anemia: Secondary | ICD-10-CM | POA: Diagnosis not present

## 2021-05-13 DIAGNOSIS — Z79899 Other long term (current) drug therapy: Secondary | ICD-10-CM | POA: Diagnosis not present

## 2021-05-13 DIAGNOSIS — Z7982 Long term (current) use of aspirin: Secondary | ICD-10-CM | POA: Diagnosis not present

## 2021-05-13 DIAGNOSIS — Y92511 Restaurant or cafe as the place of occurrence of the external cause: Secondary | ICD-10-CM | POA: Diagnosis not present

## 2021-05-13 DIAGNOSIS — E7800 Pure hypercholesterolemia, unspecified: Secondary | ICD-10-CM | POA: Diagnosis present

## 2021-05-13 DIAGNOSIS — M479 Spondylosis, unspecified: Secondary | ICD-10-CM | POA: Diagnosis present

## 2021-05-13 DIAGNOSIS — Z681 Body mass index (BMI) 19 or less, adult: Secondary | ICD-10-CM | POA: Diagnosis not present

## 2021-05-13 DIAGNOSIS — Z8249 Family history of ischemic heart disease and other diseases of the circulatory system: Secondary | ICD-10-CM | POA: Diagnosis not present

## 2021-05-13 DIAGNOSIS — C3432 Malignant neoplasm of lower lobe, left bronchus or lung: Secondary | ICD-10-CM

## 2021-05-13 DIAGNOSIS — D72829 Elevated white blood cell count, unspecified: Secondary | ICD-10-CM | POA: Diagnosis not present

## 2021-05-13 DIAGNOSIS — Z923 Personal history of irradiation: Secondary | ICD-10-CM | POA: Diagnosis not present

## 2021-05-13 DIAGNOSIS — I2510 Atherosclerotic heart disease of native coronary artery without angina pectoris: Secondary | ICD-10-CM | POA: Diagnosis present

## 2021-05-13 DIAGNOSIS — E440 Moderate protein-calorie malnutrition: Secondary | ICD-10-CM | POA: Diagnosis present

## 2021-05-13 HISTORY — PX: HIP ARTHROPLASTY: SHX981

## 2021-05-13 LAB — ECHOCARDIOGRAM LIMITED
Height: 62 in
Weight: 1696 oz

## 2021-05-13 LAB — TYPE AND SCREEN
ABO/RH(D): O POS
Antibody Screen: NEGATIVE

## 2021-05-13 LAB — ABO/RH: ABO/RH(D): O POS

## 2021-05-13 LAB — SARS CORONAVIRUS 2 BY RT PCR (HOSPITAL ORDER, PERFORMED IN ~~LOC~~ HOSPITAL LAB): SARS Coronavirus 2: NEGATIVE

## 2021-05-13 SURGERY — HEMIARTHROPLASTY, HIP, DIRECT ANTERIOR APPROACH, FOR FRACTURE
Anesthesia: General | Site: Hip | Laterality: Right

## 2021-05-13 MED ORDER — ROCURONIUM BROMIDE 10 MG/ML (PF) SYRINGE
PREFILLED_SYRINGE | INTRAVENOUS | Status: AC
  Filled 2021-05-13: qty 10

## 2021-05-13 MED ORDER — FENTANYL CITRATE (PF) 100 MCG/2ML IJ SOLN
INTRAMUSCULAR | Status: DC | PRN
  Administered 2021-05-13: 75 ug via INTRAVENOUS

## 2021-05-13 MED ORDER — LIDOCAINE 2% (20 MG/ML) 5 ML SYRINGE
INTRAMUSCULAR | Status: DC | PRN
  Administered 2021-05-13: 60 mg via INTRAVENOUS

## 2021-05-13 MED ORDER — CHLORHEXIDINE GLUCONATE CLOTH 2 % EX PADS
6.0000 | MEDICATED_PAD | Freq: Every day | CUTANEOUS | Status: DC
  Administered 2021-05-14 – 2021-05-19 (×3): 6 via TOPICAL

## 2021-05-13 MED ORDER — PROPOFOL 1000 MG/100ML IV EMUL
INTRAVENOUS | Status: AC
  Filled 2021-05-13: qty 100

## 2021-05-13 MED ORDER — IPRATROPIUM-ALBUTEROL 0.5-2.5 (3) MG/3ML IN SOLN
3.0000 mL | Freq: Once | RESPIRATORY_TRACT | Status: AC
  Administered 2021-05-13: 3 mL via RESPIRATORY_TRACT
  Filled 2021-05-13: qty 3

## 2021-05-13 MED ORDER — SODIUM CHLORIDE (PF) 0.9 % IJ SOLN
INTRAMUSCULAR | Status: AC
  Filled 2021-05-13: qty 10

## 2021-05-13 MED ORDER — BUPIVACAINE LIPOSOME 1.3 % IJ SUSP
INTRAMUSCULAR | Status: AC
  Filled 2021-05-13: qty 20

## 2021-05-13 MED ORDER — SODIUM CHLORIDE (PF) 0.9 % IJ SOLN
INTRAMUSCULAR | Status: DC | PRN
  Administered 2021-05-13: 60 mL

## 2021-05-13 MED ORDER — SODIUM CHLORIDE 0.9 % IR SOLN
Status: DC | PRN
  Administered 2021-05-13: 3000 mL

## 2021-05-13 MED ORDER — FENTANYL CITRATE (PF) 100 MCG/2ML IJ SOLN
INTRAMUSCULAR | Status: AC
  Filled 2021-05-13: qty 2

## 2021-05-13 MED ORDER — LIDOCAINE HCL (PF) 2 % IJ SOLN
INTRAMUSCULAR | Status: AC
  Filled 2021-05-13: qty 5

## 2021-05-13 MED ORDER — HYDROCODONE-ACETAMINOPHEN 5-325 MG PO TABS
1.0000 | ORAL_TABLET | Freq: Four times a day (QID) | ORAL | Status: DC | PRN
  Administered 2021-05-14 – 2021-05-19 (×11): 1 via ORAL
  Filled 2021-05-13 (×11): qty 1

## 2021-05-13 MED ORDER — BUPIVACAINE LIPOSOME 1.3 % IJ SUSP
INTRAMUSCULAR | Status: DC | PRN
  Administered 2021-05-13: 20 mL

## 2021-05-13 MED ORDER — SUCCINYLCHOLINE CHLORIDE 200 MG/10ML IV SOSY
PREFILLED_SYRINGE | INTRAVENOUS | Status: DC | PRN
  Administered 2021-05-13: 120 mg via INTRAVENOUS

## 2021-05-13 MED ORDER — WATER FOR IRRIGATION, STERILE IR SOLN
Status: DC | PRN
  Administered 2021-05-13: 2000 mL

## 2021-05-13 MED ORDER — PROMETHAZINE HCL 25 MG/ML IJ SOLN: 6.2500 mg | INTRAMUSCULAR | Status: DC | PRN

## 2021-05-13 MED ORDER — 0.9 % SODIUM CHLORIDE (POUR BTL) OPTIME
TOPICAL | Status: DC | PRN
  Administered 2021-05-13: 1000 mL

## 2021-05-13 MED ORDER — PROPOFOL 500 MG/50ML IV EMUL
INTRAVENOUS | Status: DC | PRN
  Administered 2021-05-13: 100 ug/kg/min via INTRAVENOUS
  Administered 2021-05-13: 70 ug/kg/min via INTRAVENOUS

## 2021-05-13 MED ORDER — DEXAMETHASONE SODIUM PHOSPHATE 10 MG/ML IJ SOLN
INTRAMUSCULAR | Status: DC | PRN
  Administered 2021-05-13: 8 mg via INTRAVENOUS

## 2021-05-13 MED ORDER — MORPHINE SULFATE (PF) 2 MG/ML IV SOLN: 0.5000 mg | INTRAVENOUS | Status: DC | PRN

## 2021-05-13 MED ORDER — ADULT MULTIVITAMIN W/MINERALS CH
1.0000 | ORAL_TABLET | Freq: Every day | ORAL | Status: DC
  Administered 2021-05-14 – 2021-05-19 (×5): 1 via ORAL
  Filled 2021-05-13 (×5): qty 1

## 2021-05-13 MED ORDER — FENTANYL CITRATE PF 50 MCG/ML IJ SOSY: 25.0000 ug | PREFILLED_SYRINGE | INTRAMUSCULAR | Status: DC | PRN

## 2021-05-13 MED ORDER — PHENYLEPHRINE HCL-NACL 20-0.9 MG/250ML-% IV SOLN
INTRAVENOUS | Status: DC | PRN
Start: 2021-05-13 — End: 2021-05-13
  Administered 2021-05-13: 80 ug/min via INTRAVENOUS
  Administered 2021-05-13: 40 ug/min via INTRAVENOUS

## 2021-05-13 MED ORDER — PHENYLEPHRINE HCL (PRESSORS) 10 MG/ML IV SOLN
INTRAVENOUS | Status: AC
  Filled 2021-05-13: qty 1

## 2021-05-13 MED ORDER — CHLORHEXIDINE GLUCONATE 4 % EX LIQD: 60.0000 mL | Freq: Once | CUTANEOUS | Status: AC

## 2021-05-13 MED ORDER — ROSUVASTATIN CALCIUM 20 MG PO TABS
20.0000 mg | ORAL_TABLET | Freq: Every day | ORAL | Status: DC
  Administered 2021-05-14 – 2021-05-19 (×5): 20 mg via ORAL
  Filled 2021-05-13 (×4): qty 1

## 2021-05-13 MED ORDER — ACETAMINOPHEN 10 MG/ML IV SOLN
INTRAVENOUS | Status: AC
  Filled 2021-05-13: qty 100

## 2021-05-13 MED ORDER — CHLORHEXIDINE GLUCONATE 0.12 % MT SOLN
15.0000 mL | OROMUCOSAL | Status: AC
  Administered 2021-05-13: 15 mL via OROMUCOSAL

## 2021-05-13 MED ORDER — PROPOFOL 10 MG/ML IV BOLUS
INTRAVENOUS | Status: DC | PRN
  Administered 2021-05-13: 120 mg via INTRAVENOUS

## 2021-05-13 MED ORDER — ENOXAPARIN SODIUM 40 MG/0.4ML IJ SOSY
40.0000 mg | PREFILLED_SYRINGE | INTRAMUSCULAR | Status: DC
  Administered 2021-05-14: 40 mg via SUBCUTANEOUS
  Filled 2021-05-13: qty 0.4

## 2021-05-13 MED ORDER — CEFAZOLIN SODIUM-DEXTROSE 2-4 GM/100ML-% IV SOLN
2.0000 g | Freq: Three times a day (TID) | INTRAVENOUS | Status: AC
  Administered 2021-05-13 – 2021-05-14 (×2): 2 g via INTRAVENOUS
  Filled 2021-05-13 (×2): qty 100

## 2021-05-13 MED ORDER — POVIDONE-IODINE 10 % EX SWAB
2.0000 "application " | Freq: Once | CUTANEOUS | Status: AC
  Administered 2021-05-13: 2 via TOPICAL

## 2021-05-13 MED ORDER — MORPHINE SULFATE (PF) 2 MG/ML IV SOLN
2.0000 mg | INTRAVENOUS | Status: DC | PRN
  Administered 2021-05-13 (×2): 4 mg via INTRAVENOUS
  Administered 2021-05-15 – 2021-05-16 (×2): 2 mg via INTRAVENOUS
  Filled 2021-05-13 (×2): qty 2
  Filled 2021-05-13 (×2): qty 1

## 2021-05-13 MED ORDER — OXYCODONE HCL 5 MG/5ML PO SOLN: 5.0000 mg | Freq: Once | ORAL | Status: DC | PRN

## 2021-05-13 MED ORDER — ONDANSETRON HCL 4 MG/2ML IJ SOLN
INTRAMUSCULAR | Status: DC | PRN
  Administered 2021-05-13: 4 mg via INTRAVENOUS

## 2021-05-13 MED ORDER — ACETAMINOPHEN 10 MG/ML IV SOLN
INTRAVENOUS | Status: DC | PRN
  Administered 2021-05-13: 1000 mg via INTRAVENOUS

## 2021-05-13 MED ORDER — ONDANSETRON HCL 4 MG/2ML IJ SOLN
INTRAMUSCULAR | Status: AC
  Filled 2021-05-13: qty 2

## 2021-05-13 MED ORDER — CEFAZOLIN SODIUM-DEXTROSE 2-4 GM/100ML-% IV SOLN
2.0000 g | INTRAVENOUS | Status: AC
  Administered 2021-05-13: 2 g via INTRAVENOUS
  Filled 2021-05-13: qty 100

## 2021-05-13 MED ORDER — TRANEXAMIC ACID-NACL 1000-0.7 MG/100ML-% IV SOLN
1000.0000 mg | INTRAVENOUS | Status: AC
  Administered 2021-05-13: 1000 mg via INTRAVENOUS
  Filled 2021-05-13: qty 100

## 2021-05-13 MED ORDER — LACTATED RINGERS IV SOLN: INTRAVENOUS | Status: DC

## 2021-05-13 MED ORDER — PROPOFOL 10 MG/ML IV BOLUS
INTRAVENOUS | Status: AC
  Filled 2021-05-13: qty 20

## 2021-05-13 MED ORDER — HYDROCODONE-ACETAMINOPHEN 5-325 MG PO TABS: 1.0000 | ORAL_TABLET | Freq: Four times a day (QID) | ORAL | Status: DC | PRN

## 2021-05-13 MED ORDER — FELODIPINE ER 5 MG PO TB24
10.0000 mg | ORAL_TABLET | Freq: Every day | ORAL | Status: DC
  Administered 2021-05-14 – 2021-05-19 (×6): 10 mg via ORAL
  Filled 2021-05-13 (×4): qty 2
  Filled 2021-05-13: qty 1
  Filled 2021-05-13 (×3): qty 2

## 2021-05-13 MED ORDER — DEXAMETHASONE SODIUM PHOSPHATE 10 MG/ML IJ SOLN
INTRAMUSCULAR | Status: AC
  Filled 2021-05-13: qty 1

## 2021-05-13 MED ORDER — FENTANYL CITRATE PF 50 MCG/ML IJ SOSY
PREFILLED_SYRINGE | INTRAMUSCULAR | Status: AC
  Filled 2021-05-13: qty 2

## 2021-05-13 MED ORDER — PHENYLEPHRINE 40 MCG/ML (10ML) SYRINGE FOR IV PUSH (FOR BLOOD PRESSURE SUPPORT)
PREFILLED_SYRINGE | INTRAVENOUS | Status: DC | PRN
  Administered 2021-05-13 (×3): 80 ug via INTRAVENOUS
  Administered 2021-05-13: 120 ug via INTRAVENOUS
  Administered 2021-05-13: 80 ug via INTRAVENOUS
  Administered 2021-05-13 (×2): 120 ug via INTRAVENOUS

## 2021-05-13 MED ORDER — AMISULPRIDE (ANTIEMETIC) 5 MG/2ML IV SOLN: 10.0000 mg | Freq: Once | INTRAVENOUS | Status: DC | PRN

## 2021-05-13 MED ORDER — OXYCODONE HCL 5 MG PO TABS: 5.0000 mg | ORAL_TABLET | Freq: Once | ORAL | Status: DC | PRN

## 2021-05-13 SURGICAL SUPPLY — 75 items
ADH SKN CLS APL DERMABOND .7 (GAUZE/BANDAGES/DRESSINGS) ×1
APL PRP STRL LF DISP 70% ISPRP (MISCELLANEOUS) ×2
BAG COUNTER SPONGE SURGICOUNT (BAG) IMPLANT
BAG DECANTER FOR FLEXI CONT (MISCELLANEOUS) ×2 IMPLANT
BAG SPEC THK2 15X12 ZIP CLS (MISCELLANEOUS) ×1
BAG SPNG CNTER NS LX DISP (BAG)
BAG ZIPLOCK 12X15 (MISCELLANEOUS) ×2 IMPLANT
BLADE SAW SAG 25X90X1.19 (BLADE) IMPLANT
BRUSH FEMORAL CANAL (MISCELLANEOUS) IMPLANT
CEMENT BONE SIMPLEX SPEEDSET (Cement) ×2 IMPLANT
CEMENT RESTRICTOR BONE PREP ST (Miscellaneous) ×1 IMPLANT
CHLORAPREP W/TINT 26 (MISCELLANEOUS) ×4 IMPLANT
COVER SURGICAL LIGHT HANDLE (MISCELLANEOUS) ×2 IMPLANT
DERMABOND ADVANCED (GAUZE/BANDAGES/DRESSINGS) ×1
DERMABOND ADVANCED .7 DNX12 (GAUZE/BANDAGES/DRESSINGS) ×1 IMPLANT
DRAPE 3/4 80X56 (DRAPES) ×4 IMPLANT
DRAPE HALF SHEET 70X43 (DRAPES) ×4 IMPLANT
DRAPE HIP W/POCKET STRL (MISCELLANEOUS) ×2 IMPLANT
DRAPE INCISE IOBAN 66X45 STRL (DRAPES) ×2 IMPLANT
DRAPE INCISE IOBAN 85X60 (DRAPES) ×2 IMPLANT
DRAPE POUCH INSTRU U-SHP 10X18 (DRAPES) ×2 IMPLANT
DRAPE SURG 17X11 SM STRL (DRAPES) IMPLANT
DRAPE U-SHAPE 47X51 STRL (DRAPES) ×2 IMPLANT
DRESSING AQUACEL AG SP 3.5X10 (GAUZE/BANDAGES/DRESSINGS) ×1 IMPLANT
DRSG AQUACEL AG ADV 3.5X 6 (GAUZE/BANDAGES/DRESSINGS) ×2 IMPLANT
DRSG AQUACEL AG SP 3.5X10 (GAUZE/BANDAGES/DRESSINGS) ×2
ELECT BLADE TIP CTD 4 INCH (ELECTRODE) ×2 IMPLANT
ELECT REM PT RETURN 15FT ADLT (MISCELLANEOUS) ×2 IMPLANT
FEMORAL HEAD LFIT V40 28MM PL0 (Orthopedic Implant) ×1 IMPLANT
GLOVE SRG 8 PF TXTR STRL LF DI (GLOVE) ×1 IMPLANT
GLOVE SURG ENC TEXT LTX SZ8 (GLOVE) ×4 IMPLANT
GLOVE SURG UNDER POLY LF SZ8 (GLOVE) ×2
GOWN STRL REUS W/TWL XL LVL3 (GOWN DISPOSABLE) ×2 IMPLANT
HANDPIECE INTERPULSE COAX TIP (DISPOSABLE) ×2
HEAD BP UNV 44X28XHIP FEM (Orthopedic Implant) IMPLANT
HEAD OSTEO BIPOLAR (Orthopedic Implant) ×2 IMPLANT
HOLDER FOLEY CATH W/STRAP (MISCELLANEOUS) ×2 IMPLANT
HOOD PEEL AWAY FLYTE STAYCOOL (MISCELLANEOUS) ×6 IMPLANT
IMMOBILIZER KNEE 20 (SOFTGOODS) IMPLANT
IMMOBILIZER KNEE 20 THIGH 36 (SOFTGOODS) IMPLANT
KIT BASIN OR (CUSTOM PROCEDURE TRAY) ×2 IMPLANT
KIT TURNOVER KIT A (KITS) IMPLANT
MANIFOLD NEPTUNE II (INSTRUMENTS) ×2 IMPLANT
MARKER SKIN DUAL TIP RULER LAB (MISCELLANEOUS) ×2 IMPLANT
NS IRRIG 1000ML POUR BTL (IV SOLUTION) ×2 IMPLANT
PACK TOTAL JOINT (CUSTOM PROCEDURE TRAY) ×2 IMPLANT
PROTECTOR NERVE ULNAR (MISCELLANEOUS) ×2 IMPLANT
RETRACTOR YANK SUCT EIGR SABER (INSTRUMENTS) ×2 IMPLANT
SEALER BIPOLAR AQUA 6.0 (INSTRUMENTS) ×2 IMPLANT
SET HNDPC FAN SPRY TIP SCT (DISPOSABLE) ×1 IMPLANT
SET INTERPULSE LAVAGE W/TIP (ORTHOPEDIC DISPOSABLE SUPPLIES) ×2 IMPLANT
SLEEVE STOCKINETTE LIMB 4X8 (MISCELLANEOUS) ×2 IMPLANT
SLEEVE STOCKINETTE LIMB 6X9 (MISCELLANEOUS) ×2 IMPLANT
SLING ARM IMMOBILIZER MED (SOFTGOODS) ×1 IMPLANT
SOLUTION IRRIG SURGIPHOR (IV SOLUTION) IMPLANT
SPIKE FLUID TRANSFER (MISCELLANEOUS) ×6 IMPLANT
STAPLER VISISTAT 35W (STAPLE) IMPLANT
STEM HIP ACCOLADE SZ5 37X145 (Stem) ×1 IMPLANT
SUCTION FRAZIER HANDLE 12FR (TUBING) ×1
SUCTION TUBE FRAZIER 12FR DISP (TUBING) ×1 IMPLANT
SUT ETHIBOND #5 BRAIDED 30INL (SUTURE) ×2 IMPLANT
SUT MNCRL AB 3-0 PS2 18 (SUTURE) ×2 IMPLANT
SUT STRATAFIX 0 PDS 27 VIOLET (SUTURE) ×2
SUT STRATAFIX 1PDS 45CM VIOLET (SUTURE) ×2 IMPLANT
SUT STRATAFIX PDO 1 14 VIOLET (SUTURE) ×2
SUT STRATFX PDO 1 14 VIOLET (SUTURE) ×1
SUT VIC AB 2-0 CT2 27 (SUTURE) ×4 IMPLANT
SUT VICRYL 0 27 CT2 27 ABS (SUTURE) ×4 IMPLANT
SUTURE STRATFX 0 PDS 27 VIOLET (SUTURE) ×1 IMPLANT
SUTURE STRATFX PDO 1 14 VIOLET (SUTURE) ×1 IMPLANT
TOWEL OR 17X26 10 PK STRL BLUE (TOWEL DISPOSABLE) ×2 IMPLANT
TOWER CARTRIDGE SMART MIX (DISPOSABLE) ×2 IMPLANT
TRAY FOLEY MTR SLVR 16FR STAT (SET/KITS/TRAYS/PACK) ×2 IMPLANT
TUBE KAMVAC SUCTION (TUBING) IMPLANT
WATER STERILE IRR 1000ML POUR (IV SOLUTION) ×4 IMPLANT

## 2021-05-13 NOTE — Anesthesia Postprocedure Evaluation (Signed)
Anesthesia Post Note  Patient: [name] [name]  Procedure(s) Performed: ARTHROPLASTY BIPOLAR HIP (HEMIARTHROPLASTY) (Right: Hip)     Patient location during evaluation: PACU Anesthesia Type: General Level of consciousness: awake and alert Pain management: pain level controlled Vital Signs Assessment: post-procedure vital signs reviewed and stable Respiratory status: spontaneous breathing, nonlabored ventilation, respiratory function stable and patient connected to nasal cannula oxygen Cardiovascular status: blood pressure returned to baseline and stable Postop Assessment: no apparent nausea or vomiting Anesthetic complications: no   No notable events documented.  Last Vitals:  Vitals:   05/13/21 1615 05/13/21 1630  BP: (!) 128/58 (!) 132/55  Pulse: 74 77  Resp: (!) 6 11  Temp: (!) 36.3 C   SpO2: 98% 99%    Last Pain:  Vitals:   05/13/21 1630  TempSrc:   PainSc: [name]

## 2021-05-13 NOTE — Anesthesia Procedure Notes (Signed)
Procedure Name: Intubation Date/Time: 05/13/2021 2:11 PM Performed by: Niel Hummer, CRNA Pre-anesthesia Checklist: Patient identified, Emergency Drugs available, Patient being monitored and Suction available Patient Re-evaluated:Patient Re-evaluated prior to induction Oxygen Delivery Method: Circle system utilized Preoxygenation: Pre-oxygenation with 100% oxygen Induction Type: IV induction Ventilation: Mask ventilation without difficulty Laryngoscope Size: Mac and 4 Grade View: Grade I Tube type: Oral Tube size: 7.0 mm Number of attempts: 1 Airway Equipment and Method: Stylet Placement Confirmation: ETT inserted through vocal cords under direct vision, positive ETCO2 and breath sounds checked- equal and bilateral Secured at: 21 cm Tube secured with: Tape Dental Injury: Teeth and Oropharynx as per pre-operative assessment

## 2021-05-13 NOTE — Consult Note (Signed)
ORTHOPAEDIC CONSULTATION  REQUESTING PHYSICIAN: Phillips Grout, MD  Chief Complaint: Fall with right proximal humerus and right femoral neck fracture  HPI: [name] [name] is a 87 y.o. female who was going to a restaurant with her husband yesterday when she fell getting out of the car and landing onto her right side.  She had pain in the right shoulder and right hip after the fall and inability to ambulate.  She was brought to the emergency room x-rays demonstrated displaced right femoral neck fracture and right proximal humerus fracture.  Patient is primarily a household ambulator using a walker and wheelchair.  She has a history of a stroke 2 years ago which has resulted in generalized decreased weakness and worsening gait/balance, but no other previous falls prior to yesterday.  She denies distal numbness and tingling.   Past Medical History:  Diagnosis Date   Anxiety    pt denies   Arthritis    back, legs   CAD (coronary artery disease) 2008   s/p Taxus Element Perseus Study stent (DES) to OM1 in 08/2006; EF 65%   HTN (hypertension)    Hypercholesterolemia    Ischemic stroke [facility])    Mass of lower lobe of left lung 08/2017   Pneumonia 5/16   hospitalized for 4 days   Past Surgical History:  Procedure Laterality Date   APPENDECTOMY  1950   BRONCHOSCOPY  08/03/2017   BIOPSY   CORONARY ANGIOPLASTY WITH STENT PLACEMENT     FUDUCIAL PLACEMENT Left 08/03/2017   Procedure: PLACEMENT OF FUDUCIAL;  Surgeon: Collene Gobble, MD;  Location: [facility] OR;  Service: Thoracic;  Laterality: Left;   GANGLION CYST EXCISION Right 12/26/2014   Procedure: EXCISON OF RIGHT FOOT GANGLION CYST;  Surgeon: Wylene Simmer, MD;  Location: [place] [facility];  Service: Orthopedics;  Laterality: Right;   IR RADIOLOGIST EVAL & MGMT  05/16/2018   lower back surgery  [place]   PCI of hte lesion in th marginal branch circumflex artery     TEE WITHOUT CARDIOVERSION N/A  06/21/2017   Procedure: TRANSESOPHAGEAL ECHOCARDIOGRAM (TEE);  Surgeon: Acie Fredrickson, Wonda Cheng, MD;  Location: [facility] ENDOSCOPY;  Service: Cardiovascular;  Laterality: N/A;   VIDEO BRONCHOSCOPY WITH ENDOBRONCHIAL NAVIGATION N/A 08/03/2017   Procedure: VIDEO BRONCHOSCOPY WITH ENDOBRONCHIAL NAVIGATION;  Surgeon: Collene Gobble, MD;  Location: [facility] OR;  Service: Thoracic;  Laterality: N/A;   Social History   Socioeconomic History   Marital status: Married    Spouse name: Not on file   Number of children: Not on file   Years of education: Not on file   Highest education level: Not on file  Occupational History   Not on file  Tobacco Use   Smoking status: Former    Packs/day: 1.00    Years: 40.00    Pack years: 40.00    Types: Cigarettes    Quit date: 2008    Years since quitting: 15.1   Smokeless tobacco: Never  Vaping Use   Vaping Use: Never used  Substance and Sexual Activity   Alcohol use: No   Drug use: No   Sexual activity: Not on file  Other Topics Concern   Not on file  Social History Narrative   Not on file   Social Determinants of Health   Financial Resource Strain: Not on file  Food Insecurity: Not on file  Transportation Needs: Not on file  Physical Activity: Not on file  Stress: Not on file  Social Connections: Not on file   Family History  Problem Relation Age of Onset   Heart failure Mother    Hypertension Mother    Allergies  Allergen Reactions   Other Anaphylaxis and Swelling    Food or preservative allergy of unknown/undetermined origin (happened last in 2017 when the patient had gone out to eat and hadn't yet touched her steak??)     Positive ROS: All other systems have been reviewed and were otherwise negative with the exception of those mentioned in the HPI and as above.  Physical Exam: General: Alert, no acute distress Cardiovascular: No pedal edema Respiratory: No cyanosis, no use of accessory musculature Skin: No lesions in the area of chief  complaint Neurologic: Sensation intact distally Psychiatric: Patient is competent for consent with normal mood and affect  MUSCULOSKELETAL:  RLE No traumatic wounds, ecchymosis, or rash  Tender about the hip nontender about the knee and ankle  No knee or ankle effusion  Sens DPN, SPN, TN intact  Motor EHL, ext, flex 5/5  DP 2+, PT 2+, No significant edema LLE No traumatic wounds, ecchymosis, or rash  Nontender  No groin pain with log roll  No knee or ankle effusion  Knee stable to varus/ valgus stress  Sens DPN, SPN, TN intact  Motor EHL, ext, flex 5/5  DP 2+, PT 2+, No significant edema  RUE No traumatic wounds, mild ecchymosis in the axilla, mild swelling about the shoulder  Tender about the shoulder, nontender about the elbow and wrist No elbow or wrist effusion  Sens median, radial, ulnar intact  Motor AIN, PIN, IO intact  Radial pulse 2+, No significant edema LUE No traumatic wounds, ecchymosis, or rash  Nontender No elbow or wrist effusion  Sens median, radial, ulnar intact  Motor AIN, PIN, IO intact  Radial pulse 2+, No significant edema   IMAGING: X-rays demonstrate an impacted displaced right proximal humerus fracture, and varus displaced right femoral neck fracture  Assessment: Principal Problem:   Closed displaced fracture of right femoral neck (HCC) Active Problems:   Essential hypertension   Coronary atherosclerosis   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  Discussed that in regards to the right hip she has a femoral neck fracture not amenable to fixation.  Discussed arthroplasty with Hemi versus total arthroplasty.  Given the patient's age, activity level, and no prodromal hip pain or symptoms I feel she would be a good candidate for  hemiarthroplasty.  We will plan to do the surgery today pending medical clearance.  The risks benefits and alternatives were discussed with the patient including but not limited to the risks of nonoperative treatment, versus surgical intervention including infection, bleeding, nerve injury, periprosthetic fracture, the need for revision surgery, dislocation, leg length discrepancy, blood clots, cardiopulmonary complications, morbidity, mortality, among others, and they were willing to proceed.     In regards to the right shoulder she has a proximal humerus fracture likely indicated for fixation.  Will discuss with Dr. Griffin Basil regarding operative versus nonoperative surgical treatment options.  Logistically may be challenging to perform both the hip hemiarthroplasty and shoulder surgery during the same trip to the OR. The shoulder surgery may have to be staged for later this week after the hip surgery.  Willaim Sheng, MD  Contact information:   [id number] 7am-5pm epic message Dr. Zachery Dakins, or call office for patient follow up: [phone] After hours and holidays please check [email or web address] for group call information for [facility]

## 2021-05-13 NOTE — Interval H&P Note (Signed)
Plan to proceed with R hip hemiarthroplasty this afternoon. Patient evaluated by cardiology, Dr. Johney Frame, and patient is high risk but okay to proceed with surgery. Spoke with Dr. Griffin Basil, plan to discuss rTSA possibly this Friday to address the R proximal humerus fracture.  The operative side was examined and the patient was confirmed to have. Sens DPN, SPN, TN intact, Motor EHL, ext, flex 5/5, and DP 2+, PT 2+, No significant edema.   The risks, benefits, and alternatives have been discussed at length with patient and her husband, and the patient is willing to proceed.  Right hip marked. Consent has been signed.

## 2021-05-13 NOTE — ED Notes (Signed)
Short Stay here to take pt.  

## 2021-05-13 NOTE — Assessment & Plan Note (Addendum)
Immobilize shoulder for the moment CT performed Pending ortho recs, not sure if this will be surgical or not yet.

## 2021-05-13 NOTE — H&P (Addendum)
History and Physical    Patient: [name] [name] RCV:[id number] DOB: 02-Dec-1934 DOA: 05/12/2021 DOS: the patient was seen and examined on 05/13/2021 PCP: Leonard Downing, MD  Patient coming from: Home  Chief Complaint:  Chief Complaint  Patient presents with   Fall    Mechanical   Hip Pain    HPI: [name] [name] is a 87 y.o. female with medical history significant of stroke, HTN, CAD s/p stent in 2008, NSCLC of LLL, s/p radiation in 2019-2020 in remission / improved as of CT chest in 2021.  Pt had mechanical fall while getting out of car at home today.  Fell on R side, severe R hip and shoulder pain post fall, inability to ambulate.  Brought into the ED.  Pain persistent in ED.  Review of Systems: As mentioned in the history of present illness. All other systems reviewed and are negative. Past Medical History:  Diagnosis Date   Anxiety    pt denies   Arthritis    back, legs   CAD (coronary artery disease) 2008   s/p Taxus Element Perseus Study stent (DES) to OM1 in 08/2006; EF 65%   HTN (hypertension)    Hypercholesterolemia    Ischemic stroke [facility])    Mass of lower lobe of left lung 08/2017   Pneumonia 5/16   hospitalized for 4 days   Past Surgical History:  Procedure Laterality Date   APPENDECTOMY  1950   BRONCHOSCOPY  08/03/2017   BIOPSY   CORONARY ANGIOPLASTY WITH STENT PLACEMENT     FUDUCIAL PLACEMENT Left 08/03/2017   Procedure: PLACEMENT OF FUDUCIAL;  Surgeon: Collene Gobble, MD;  Location: [facility] OR;  Service: Thoracic;  Laterality: Left;   GANGLION CYST EXCISION Right 12/26/2014   Procedure: EXCISON OF RIGHT FOOT GANGLION CYST;  Surgeon: Wylene Simmer, MD;  Location: [place];  Service: Orthopedics;  Laterality: Right;   IR RADIOLOGIST EVAL & MGMT  05/16/2018   lower back surgery  [place]   PCI of hte lesion in th marginal branch circumflex artery     TEE WITHOUT CARDIOVERSION N/A 06/21/2017    Procedure: TRANSESOPHAGEAL ECHOCARDIOGRAM (TEE);  Surgeon: Acie Fredrickson Wonda Cheng, MD;  Location: [facility] ENDOSCOPY;  Service: Cardiovascular;  Laterality: N/A;   VIDEO BRONCHOSCOPY WITH ENDOBRONCHIAL NAVIGATION N/A 08/03/2017   Procedure: VIDEO BRONCHOSCOPY WITH ENDOBRONCHIAL NAVIGATION;  Surgeon: Collene Gobble, MD;  Location: [place];  Service: Thoracic;  Laterality: N/A;   Social History:  reports that she quit smoking about 15 years ago. Her smoking use included cigarettes. She has a 40.00 pack-year smoking history. She has never used smokeless tobacco. She reports that she does not drink alcohol and does not use drugs.  Allergies  Allergen Reactions   Other Anaphylaxis and Swelling    Food or preservative allergy of unknown/undetermined origin (happened last in 2017 when the patient had gone out to eat and hadn't yet touched her steak??)    Family History  Problem Relation Age of Onset   Heart failure Mother    Hypertension Mother     Prior to Admission medications   Medication Sig Start Date End Date Taking? Authorizing Provider  Calcium Citrate (CITRACAL PO) Take 2 tablets by mouth 2 (two) times a week.   Yes [provider]  Cholecalciferol (VITAMIN D3) 50 MCG (2000 UT) TABS Take 2,000 Units by mouth daily.   Yes [provider]  EPINEPHrine 0.3 mg/0.3 mL IJ  SOAJ injection Inject 0.3 mg into the muscle once as needed (for anaphylaxis).   Yes [provider]  felodipine (PLENDIL) 10 MG 24 hr tablet Take 10 mg by mouth daily.     Yes [provider]  Multiple Vitamin (MULTIVITAMIN WITH MINERALS) TABS tablet Take 1 tablet by mouth daily. Patient taking differently: Take 1 tablet by mouth daily with breakfast. 05/11/17  Yes Georgette Shell, MD  nitroGLYCERIN (NITROSTAT) 0.4 MG SL tablet Place 1 tablet (0.4 mg total) under the tongue every 5 (five) minutes as needed for chest pain. 07/10/14  Yes Josue Hector, MD    Physical Exam: Vitals:   05/12/21 2150  05/12/21 2336 05/13/21 0101 05/13/21 0415  BP: (!) 151/79 (!) 154/77 (!) 160/89 (!) 151/89  Pulse: (!) 110 (!) 109 (!) 107 (!) 107  Resp: 16 18 18 15   Temp: 97.7 F (36.5 C)     TempSrc: Oral     SpO2: 92% 90% 90% 93%   Constitutional: Uncomfortable due to pain Eyes: PERRL, lids and conjunctivae normal ENMT: Mucous membranes are moist. Posterior pharynx clear of any exudate or lesions.Normal dentition.  Neck: normal, supple, no masses, no thyromegaly Respiratory: clear to auscultation bilaterally, no wheezing, no crackles. Normal respiratory effort. No accessory muscle use.  Cardiovascular: Regular rate and rhythm, no murmurs / rubs / gallops. No extremity edema. 2+ pedal pulses. No carotid bruits.  Abdomen: no tenderness, no masses palpated. No hepatosplenomegaly. Bowel sounds positive.  Musculoskeletal: R shoulder and hip TTP Skin: no rashes, lesions, ulcers. No induration Neurologic: CN 2-12 grossly intact. Sensation intact, DTR normal. Strength 5/5 in all 4.  Psychiatric: Normal judgment and insight. Alert and oriented x 3. Normal mood.    Data Reviewed:  R shoulder and R hip fractures  Assessment and Plan: * Closed displaced fracture of right femoral neck ([place])- (present on admission) Ortho consulted Hip fx pathway NPO, IVF Morphine PRN pain EKG shows inferior Q waves that appear to be new since 2019. Will order 2d echo for AM. And send message to cardiology given the EKG findings and need for surgical risk stratification pre-op. Will also order CT chest given h/o lung CA and shes almost certainly overdue. Looks like surgery planned for 1435 today.  Closed fracture of right shoulder- (present on admission) Immobilize shoulder for the moment CT performed Pending ortho recs, not sure if this will be surgical or not yet.  Malignant 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 ([place])- (present on admission) S/p radiation in late 2019, early 2020. Apparently in remission at  this time. CXR neg for any obvious mass findings. Last CT of chest showed improvement in tumor and no progression as of 2021. Probably overdue for repeat CT chest, ill order this w/o contrast just to make sure there isnt evidence of cancer all over the place.  Coronary atherosclerosis- (present on admission) Stent on 08 NSVT in 2019 No symptoms, but concerned given new Q wave findings on EKG today compared to 2019. 2d echo ordered And message sent to [facility] for cards eval in AM for pre-op clearance  Essential hypertension- (present on admission) Cont felodipine.       Advance Care Planning:   Code Status: Full Code  Consults: Dr. Zachery Dakins with ortho, also sent message to [facility] for pre-op cards eval in AM given the cardiac history, EKG changes, etc  Family Communication: Husband at bedside  Severity of Illness: The appropriate patient status for this patient is INPATIENT. Inpatient  status is judged to be reasonable and necessary in order to provide the required intensity of service to ensure the patient's safety. The patient's presenting symptoms, physical exam findings, and initial radiographic and laboratory data in the context of their chronic comorbidities is felt to place them at high risk for further clinical deterioration. Furthermore, it is not anticipated that the patient will be medically stable for discharge from the hospital within 2 midnights of admission.   * I certify that at the point of admission it is my clinical judgment that the patient will require inpatient hospital care spanning beyond 2 midnights from the point of admission due to high intensity of service, high risk for further deterioration and high frequency of surveillance required.*  Author: Etta Quill., DO 05/13/2021 5:15 AM  For on call review [email or web address].

## 2021-05-13 NOTE — Assessment & Plan Note (Signed)
Cont felodipine.

## 2021-05-13 NOTE — Assessment & Plan Note (Addendum)
S/p radiation in late 2019, early 2020. Apparently in remission at this time. CXR neg for any obvious mass findings. Last CT of chest showed improvement in tumor and no progression as of 2021. Probably overdue for repeat CT chest, ill order this w/o contrast just to make sure there isnt evidence of cancer all over the place.

## 2021-05-13 NOTE — ED Notes (Signed)
Surgery at the bedside.

## 2021-05-13 NOTE — Assessment & Plan Note (Addendum)
Ortho consulted Hip fx pathway NPO, IVF Morphine PRN pain EKG shows inferior Q waves that appear to be new since 2019. Will order 2d echo for AM. And send message to cardiology given the EKG findings and need for surgical risk stratification pre-op. Will also order CT chest given h/o lung CA and shes almost certainly overdue. Looks like surgery planned for 1435 today.

## 2021-05-13 NOTE — Consult Note (Addendum)
Cardiology Consultation:   Patient ID: [name] [name] MRN: [id number]; DOB: [birth year]  Admit date: 05/12/2021 Date of Consult: 05/13/2021  PCP:  Leonard Downing, MD   [facility] Providers Cardiologist:  Jenkins Rouge, MD        Patient Profile:   [name] [name] is a 87 y.o. 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 Shanon Brow.  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  Myoview obtained on 07/22/2014 showed normal EF without ischemia or infarction.  Carotid Doppler in April 2016 demonstrated 40 to 59% left ICA stenosis, however repeat study in March 2019 demonstrated 1 to 39% disease bilaterally.  Patient was admitted in March 2019 with weakness.  MRI of the brain demonstrated numerous small acute infarcts.  CT of the chest showed left lower lobe lung mass.  TEE was performed on 06/21/2017 which demonstrated EF 65 to 70%, no evidence of thrombus or vegetation.  Patient had brief run of nonsustained VT and was seen by Dr. Stanford Breed.  Dr. Stanford Breed recommended loop recorder, however in light of newly diagnosed left lower lobe lung mass concerning for malignancy, further cardiac evaluation was deferred until a definitive diagnosis of the mass and prognosis can be made.  Patient was last seen by Truitt Merle on 07/26/2017 prior to lung biopsy.  She was cleared to proceed from cardiac perspective. Patient was later diagnosed with squamous cell carcinoma.  She was turned down by CT surgery for pulmonary resection due to high risk, however felt she may be a candidate for radiotherapy stereotactic of the  left lung.  Patient eventually underwent radiation therapy.  The scan in May 2021 demonstrated the size of the mass has significantly reduced from the previous 2.5 x 3.9 cm down to 1.3 x 2.3 cm.  Patient presented to [name], ED last night on 05/12/2021 after a mechanical fall.  According to the husband, he was dropping her off at a [employer].  As she was getting out of the car and trying to step up to the curb, she fell backward onto her right side.  Afterward, she complained of significant right shoulder and right hip pain.  On arrival to [facility], ED, renal function and electrolyte appears to be normal.  White blood cell count was elevated to 22.4.  CT of the cervical spine showed no acute fracture CT of the head showed no acute abnormality either.  Right shoulder CT demonstrated impacted three-part fracture of the right humeral head.  CT of the chest showed progressive masslike enlargement of the left lower lobe neoplasm at 3.5 x 2.2 x 1.9 cm highly concerning for locally recurrent disease.  Further evaluation with nonemergent PET-CT was recommended.  CT of the chest also suggested underlying COPD with three-vessel coronary artery disease.  Femoral x-ray demonstrated impacted subcapital right femoral neck fracture. Patient has been evaluated by orthopedic surgery who felt she may be a candidate for hemiarthroplasty of the right hip.  As far as her right shoulder fracture, orthopedic surgery plan to discuss further regarding staged operative versus nonoperative surgical treatment options.  Cardiology service has been consulted for preoperative clearance.  During the ED visit, she has been tachycardic with heart rate in the 110s.  EKG demonstrated sinus rhythm with new Q waves in the inferior lead when compared to the 2019 EKG.  Talking with the patient and her husband, she does not do much activity.  She mostly stays indoors.  Husband does complain of worsening dyspnea after walking 50 feet that has  been progressive in the past year.  Interview of the patient has been made difficult as she received multiple doses of morphine prior to the interview and did not respond to majority of the questions.   Past Medical History:  Diagnosis Date   Anxiety    pt denies   Arthritis    back, legs   CAD (coronary artery disease) 2008   s/p Taxus Element Perseus Study stent (DES) to OM1 in 08/2006; EF 65%   HTN (hypertension)    Hypercholesterolemia    Ischemic stroke [facility])    Mass of lower lobe of left lung 08/2017   Pneumonia 5/16   hospitalized for 4 days    Past Surgical History:  Procedure Laterality Date   APPENDECTOMY  1950   BRONCHOSCOPY  08/03/2017   BIOPSY   CORONARY ANGIOPLASTY WITH STENT PLACEMENT     FUDUCIAL PLACEMENT Left 08/03/2017   Procedure: PLACEMENT OF FUDUCIAL;  Surgeon: Collene Gobble, MD;  Location: [facility] OR;  Service: Thoracic;  Laterality: Left;   GANGLION CYST EXCISION Right 12/26/2014   Procedure: EXCISON OF RIGHT FOOT GANGLION CYST;  Surgeon: Wylene Simmer, MD;  Location: [place];  Service: Orthopedics;  Laterality: Right;   IR RADIOLOGIST EVAL & MGMT  05/16/2018   lower back surgery  [facility]   PCI of hte lesion in th marginal branch circumflex artery     TEE WITHOUT CARDIOVERSION N/A 06/21/2017   Procedure: TRANSESOPHAGEAL ECHOCARDIOGRAM (TEE);  Surgeon: Acie Fredrickson Wonda Cheng, MD;  Location: [facility];  Service: Cardiovascular;  Laterality: N/A;   VIDEO BRONCHOSCOPY WITH ENDOBRONCHIAL NAVIGATION N/A 08/03/2017   Procedure: VIDEO BRONCHOSCOPY WITH ENDOBRONCHIAL NAVIGATION;  Surgeon: Collene Gobble, MD;  Location: [facility] OR;  Service: Thoracic;  Laterality: N/A;     Home Medications:  Prior to Admission medications   Medication Sig Start Date End Date Taking? Authorizing Provider  Calcium Citrate (CITRACAL PO) Take 2 tablets by mouth 2 (two) times a week.   Yes [provider]  Cholecalciferol  (VITAMIN D3) 50 MCG (2000 UT) TABS Take 2,000 Units by mouth daily.   Yes [provider]  EPINEPHrine 0.3 mg/0.3 mL IJ SOAJ injection Inject 0.3 mg into the muscle once as needed (for anaphylaxis).   Yes [provider]  felodipine (PLENDIL) 10 MG 24 hr tablet Take 10 mg by mouth daily.     Yes [provider]  Multiple Vitamin (MULTIVITAMIN WITH MINERALS) TABS tablet Take 1 tablet by mouth daily. Patient taking differently: Take 1 tablet by mouth daily with breakfast. 05/11/17  Yes Georgette Shell, MD  nitroGLYCERIN (NITROSTAT) 0.4 MG SL tablet Place 1 tablet (0.4 mg total) under the tongue every 5 (five) minutes as needed for chest pain. 07/10/14  Yes Josue Hector, MD    Inpatient Medications: Scheduled Meds:  felodipine  10 mg Oral Daily   multivitamin with minerals  1 tablet Oral Daily   Continuous Infusions:  sodium chloride 125 mL/hr at 05/12/21 2312  PRN Meds: HYDROcodone-acetaminophen, morphine injection  Allergies:    Allergies  Allergen Reactions   Other Anaphylaxis and Swelling    Food or preservative allergy of unknown/undetermined origin (happened last in 2017 when the patient had gone out to eat and hadn't yet touched her steak??)    Social History:   Social History   Socioeconomic History   Marital status: Married    Spouse name: Not on file   Number of children: Not on file   Years of education: Not on file   Highest education level: Not on file  Occupational History   Not on file  Tobacco Use   Smoking status: Former    Packs/day: 1.00    Years: 40.00    Pack years: 40.00    Types: Cigarettes    Quit date: 2008    Years since quitting: 15.1   Smokeless tobacco: Never  Vaping Use   Vaping Use: Never used  Substance and Sexual Activity   Alcohol use: No   Drug use: No   Sexual activity: Not on file  Other Topics Concern   Not on file  Social History Narrative   Not on file   Social Determinants of Health    Financial Resource Strain: Not on file  Food Insecurity: Not on file  Transportation Needs: Not on file  Physical Activity: Not on file  Stress: Not on file  Social Connections: Not on file  Intimate Partner Violence: Not on file    Family History:    Family History  Problem Relation Age of Onset   Heart failure Mother    Hypertension Mother      ROS:  Please see the history of present illness.   All other ROS reviewed and negative.     Physical Exam/Data:   Vitals:   05/13/21 0101 05/13/21 0415 05/13/21 0630 05/13/21 0715  BP: (!) 160/89 (!) 151/89 130/87 (!) 131/97  Pulse: (!) 107 (!) 107 (!) 105 (!) 110  Resp: 18 15 10 12   Temp:      TempSrc:      SpO2: 90% 93% 92% 91%   No intake or output data in the 24 hours ending 05/13/21 0813 Last 3 Weights 05/16/2018 05/03/2018 09/15/2017  Weight (lbs) 106 lb 106 lb 12.8 oz 106 lb  Weight (kg) 48.081 kg 48.444 kg 48.081 kg     There is no height or weight on file to calculate BMI.  General:  Well nourished, well developed, in no acute distress HEENT: normal Neck: no JVD Vascular: No carotid bruits; Distal pulses 2+ bilaterally Cardiac:  normal S1, S2; RRR; no murmur  Lungs:  clear to auscultation bilaterally, no wheezing, rhonchi or rales  Abd: soft, nontender, no hepatomegaly  Ext: no edema Musculoskeletal:  No deformities, BUE and BLE strength normal and equal Skin: warm and dry  Neuro:  CNs 2-12 intact, no focal abnormalities noted Psych:  Normal affect   EKG:  The EKG was personally reviewed and demonstrates: Normal sinus rhythm, new Q waves in the inferior lead. Telemetry:  Telemetry was personally reviewed and demonstrates: Sinus tachycardia with heart rate in the 110s.  Relevant CV Studies:  TEE 06/21/2017 LV EF: 65% -   70%   -------------------------------------------------------------------  Indications:      CVA 436.   -------------------------------------------------------------------  Study  Conclusions   - Left ventricle: Systolic function was vigorous. The estimated    ejection fraction was in the range of 65% to 70%.  - Aortic valve: No  evidence of vegetation.  - Left atrium: No evidence of thrombus in the atrial cavity or    appendage.  - Pulmonic valve: No evidence of vegetation.   Laboratory Data:  High Sensitivity Troponin:  No results for input(s): TROPONINIHS in the last 720 hours.   Chemistry Recent Labs  Lab 05/12/21 2258  NA 137  K 3.7  CL 104  CO2 24  GLUCOSE 168*  BUN 11  CREATININE 0.83  CALCIUM 8.8*  GFRNONAA >60  ANIONGAP 9    No results for input(s): PROT, ALBUMIN, AST, ALT, ALKPHOS, BILITOT in the last 168 hours. Lipids No results for input(s): CHOL, TRIG, HDL, LABVLDL, LDLCALC, CHOLHDL in the last 168 hours.  Hematology Recent Labs  Lab 05/12/21 2258  WBC 22.4*  RBC 4.27  HGB 12.6  HCT 38.3  MCV 89.7  MCH 29.5  MCHC 32.9  RDW 13.4  PLT 256   Thyroid No results for input(s): TSH, FREET4 in the last 168 hours.  BNPNo results for input(s): BNP, PROBNP in the last 168 hours.  DDimer No results for input(s): DDIMER in the last 168 hours.   Radiology/Studies:  DG Chest 1 View  Result Date: 05/13/2021 CLINICAL DATA:  Fall EXAM: CHEST  1 VIEW COMPARISON:  04/21/2018 FINDINGS: Postoperative changes at the left base. Left basilar scarring. Right lung clear. Heart is normal size. Aortic atherosclerosis. There is hyperinflation of the lungs compatible with COPD. IMPRESSION: COPD/chronic changes.  No active disease. Electronically Signed   By: Rolm Baptise M.D.   On: 05/13/2021 00:18   DG Shoulder Right  Result Date: 05/12/2021 CLINICAL DATA:  Tripped and fell, pain EXAM: RIGHT SHOULDER - 2+ VIEW COMPARISON:  None. FINDINGS: Frontal and transscapular views of the right shoulder are obtained. There is anterior dislocation of the glenohumeral joint. Abnormal angulation of the humeral neck consistent with an impacted fracture. No other acute bony  abnormalities. Right chest is clear. IMPRESSION: 1. Impacted right humeral neck fracture. 2. Anterior glenohumeral dislocation. Electronically Signed   By: Randa Ngo M.D.   On: 05/12/2021 22:23   CT Head Wo Contrast  Result Date: 05/13/2021 CLINICAL DATA:  Head trauma, minor (Age >= 65y); Neck trauma (Age >= 65y). Fall. EXAM: CT HEAD WITHOUT CONTRAST CT CERVICAL SPINE WITHOUT CONTRAST TECHNIQUE: Multidetector CT imaging of the head and cervical spine was performed following the standard protocol without intravenous contrast. Multiplanar CT image reconstructions of the cervical spine were also generated. RADIATION DOSE REDUCTION: This exam was performed according to the departmental dose-optimization program which includes automated exposure control, adjustment of the mA and/or kV according to patient size and/or use of iterative reconstruction technique. COMPARISON:  None. FINDINGS: CT HEAD FINDINGS Brain: Normal anatomic configuration. Parenchymal volume loss is commensurate with the patient's age. Moderate periventricular white matter changes are present likely reflecting the sequela of small vessel ischemia. Remote lacunar infarct noted within the right basal ganglia and peripheral right cerebellar hemisphere. No abnormal intra or extra-axial mass lesion or fluid collection. No abnormal mass effect or midline shift. No evidence of acute intracranial hemorrhage or infarct. Ventricular size is normal. Cerebellum unremarkable. Vascular: No asymmetric hyperdense vasculature at the skull base. Skull: Intact Sinuses/Orbits: Paranasal sinuses are clear. Orbits are unremarkable. Other: Mastoid air cells and middle ear cavities are clear. CT CERVICAL SPINE FINDINGS Alignment: 2 mm anterolisthesis of C4-5 and C7-T1 and minimal retrolisthesis of C5-6 are likely degenerative in nature. Similarly, mild reversal of the normal cervical lordosis at C3-C6 is likely degenerative in  nature. Skull base and vertebrae:  Craniocervical alignment is normal. The atlantodental interval is not widened. The osseous structures are diffusely osteopenic. No acute fracture of the cervical spine. Vertebral body height is preserved. There is ankylosis of the right C2-3 facet joint. Soft tissues and spinal canal: There is mild central canal stenosis at C5-6 due to a combination of minimal retrolisthesis as well as posterior disc osteophyte complex with abutment and minimal flattening of the thecal sac. Similar changes are noted at C6-7. Spinal canal is otherwise widely patent. No canal hematoma. The prevertebral soft tissues are not thickened and there is no paravertebral fluid collection identified. Extensive calcification within the left carotid bifurcation. No pathologic adenopathy. Disc levels: There is intervertebral disc space narrowing and endplate remodeling at J1-O8 in keeping with changes of moderate to severe degenerative disc disease. Disc calcifications within the remaining intervertebral disc spaces are compatible with mild degenerative changes at these levels. Prevertebral soft tissues are not thickened. Review of the axial images demonstrates multilevel uncovertebral and facet arthrosis resulting in multilevel moderate to severe neuroforaminal narrowing, most severe on the right at C4-5 and C5-6 Upper chest: Advanced emphysema. Other: None IMPRESSION: No acute intracranial abnormality.  No calvarial fracture. No acute fracture or listhesis of the cervical spine. Emphysema (ICD10-J43.9). Electronically Signed   By: Fidela Salisbury M.D.   On: 05/13/2021 00:22   CT CHEST WO CONTRAST  Result Date: 05/13/2021 CLINICAL DATA:  87 year old female with history of non-small cell lung cancer. Patient diagnosed in 2019 status post radiation therapy to the left lung. History of trauma from a fall yesterday evening. Low oxygen saturations. EXAM: CT CHEST WITHOUT CONTRAST TECHNIQUE: Multidetector CT imaging of the chest was performed  following the standard protocol without IV contrast. RADIATION DOSE REDUCTION: This exam was performed according to the departmental dose-optimization program which includes automated exposure control, adjustment of the mA and/or kV according to patient size and/or use of iterative reconstruction technique. COMPARISON:  Chest CT 08/24/2019. FINDINGS: Cardiovascular: Heart size is normal. There is no significant pericardial fluid, thickening or pericardial calcification. There is aortic atherosclerosis, as well as atherosclerosis of the great vessels of the mediastinum and the coronary arteries, including calcified atherosclerotic plaque in the left main, left anterior descending, left circumflex and right coronary arteries. Mediastinum/Nodes: No pathologically enlarged mediastinal or hilar lymph nodes. Please note that accurate exclusion of hilar adenopathy is limited on noncontrast CT scans. Esophagus is unremarkable in appearance. No axillary lymphadenopathy. Lungs/Pleura: No pneumothorax. No acute consolidative airspace disease. No pleural effusions. Fiducial markers in the left lower lobe are again noted, surrounded by a mass-like area of architectural distortion which is increased in size compared to prior examinations (axial image 83 of series 3 and coronal image 61 of series 4) measuring 3.5 x 2.2 x 1.9 cm on today's study, concerning for local recurrence of neoplasm. Small right upper lobe calcified granuloma. No other suspicious appearing pulmonary nodules or masses are noted. Diffuse bronchial wall thickening with mild to moderate centrilobular and paraseptal emphysema. Upper Abdomen: Atherosclerotic calcifications in the abdominal aorta. Small calcified granuloma incidentally noted in the liver. Musculoskeletal: Acute impacted right humeral neck fracture. There are no acute displaced fractures or aggressive appearing lytic or blastic lesions noted in the visualized portions of the skeleton. Several  chronic appearing compression fractures are noted in the thoracic and lumbar spine at T2, T3, T6, T9, T10, T11, T12 and L1, most severe at L1 where there is up to 70% loss of central vertebral  body height. IMPRESSION: 1. Acute impacted right humeral neck fracture partially imaged. 2. No other evidence of significant acute traumatic injury to the thorax. 3. Progressive mass-like enlargement of treated left lower lobe neoplasm which currently measures 3.5 x 2.2 x 1.9 cm, highly concerning for locally recurrent disease. Further evaluation with nonemergent PET-CT is recommended in the near future to better evaluate this finding. 4. Mild diffuse bronchial wall thickening with mild to moderate centrilobular and paraseptal emphysema; imaging findings suggestive of underlying COPD. 5. Aortic atherosclerosis, in addition to left main and three-vessel coronary artery disease. Aortic Atherosclerosis (ICD10-I70.0) and Emphysema (ICD10-J43.9). Electronically Signed   By: Vinnie Langton M.D.   On: 05/13/2021 06:03   CT Cervical Spine Wo Contrast  Result Date: 05/13/2021 CLINICAL DATA:  Head trauma, minor (Age >= 65y); Neck trauma (Age >= 65y). Fall. EXAM: CT HEAD WITHOUT CONTRAST CT CERVICAL SPINE WITHOUT CONTRAST TECHNIQUE: Multidetector CT imaging of the head and cervical spine was performed following the standard protocol without intravenous contrast. Multiplanar CT image reconstructions of the cervical spine were also generated. RADIATION DOSE REDUCTION: This exam was performed according to the departmental dose-optimization program which includes automated exposure control, adjustment of the mA and/or kV according to patient size and/or use of iterative reconstruction technique. COMPARISON:  None. FINDINGS: CT HEAD FINDINGS Brain: Normal anatomic configuration. Parenchymal volume loss is commensurate with the patient's age. Moderate periventricular white matter changes are present likely reflecting the sequela of small  vessel ischemia. Remote lacunar infarct noted within the right basal ganglia and peripheral right cerebellar hemisphere. No abnormal intra or extra-axial mass lesion or fluid collection. No abnormal mass effect or midline shift. No evidence of acute intracranial hemorrhage or infarct. Ventricular size is normal. Cerebellum unremarkable. Vascular: No asymmetric hyperdense vasculature at the skull base. Skull: Intact Sinuses/Orbits: Paranasal sinuses are clear. Orbits are unremarkable. Other: Mastoid air cells and middle ear cavities are clear. CT CERVICAL SPINE FINDINGS Alignment: 2 mm anterolisthesis of C4-5 and C7-T1 and minimal retrolisthesis of C5-6 are likely degenerative in nature. Similarly, mild reversal of the normal cervical lordosis at C3-C6 is likely degenerative in nature. Skull base and vertebrae: Craniocervical alignment is normal. The atlantodental interval is not widened. The osseous structures are diffusely osteopenic. No acute fracture of the cervical spine. Vertebral body height is preserved. There is ankylosis of the right C2-3 facet joint. Soft tissues and spinal canal: There is mild central canal stenosis at C5-6 due to a combination of minimal retrolisthesis as well as posterior disc osteophyte complex with abutment and minimal flattening of the thecal sac. Similar changes are noted at C6-7. Spinal canal is otherwise widely patent. No canal hematoma. The prevertebral soft tissues are not thickened and there is no paravertebral fluid collection identified. Extensive calcification within the left carotid bifurcation. No pathologic adenopathy. Disc levels: There is intervertebral disc space narrowing and endplate remodeling at K3-K9 in keeping with changes of moderate to severe degenerative disc disease. Disc calcifications within the remaining intervertebral disc spaces are compatible with mild degenerative changes at these levels. Prevertebral soft tissues are not thickened. Review of the axial  images demonstrates multilevel uncovertebral and facet arthrosis resulting in multilevel moderate to severe neuroforaminal narrowing, most severe on the right at C4-5 and C5-6 Upper chest: Advanced emphysema. Other: None IMPRESSION: No acute intracranial abnormality.  No calvarial fracture. No acute fracture or listhesis of the cervical spine. Emphysema (ICD10-J43.9). Electronically Signed   By: Fidela Salisbury M.D.   On: 05/13/2021 00:22  CT Shoulder Right Wo Contrast  Result Date: 05/13/2021 CLINICAL DATA:  Right shoulder fracture dislocation EXAM: CT OF THE UPPER RIGHT EXTREMITY WITHOUT CONTRAST TECHNIQUE: Multidetector CT imaging of the upper right extremity was performed according to the standard protocol. RADIATION DOSE REDUCTION: This exam was performed according to the departmental dose-optimization program which includes automated exposure control, adjustment of the mA and/or kV according to patient size and/or use of iterative reconstruction technique. COMPARISON:  None. FINDINGS: Bones/Joint/Cartilage The osseous structures are diffusely osteopenic. There is an acute impacted fracture of the a right humeral head with fracture planes involving the greater tuberosity and surgical neck of the humerus. The humeral shaft appears impacted within the humeral head by approximately 2 cm. Greater tuberosity fracture fragment appears minimally displaced. Fracture fragments are in grossly anatomic alignment. There is anteroinferior subluxation of the humeral head in relation of the glenoid fossa likely representing pseudosubluxation in the presence of a moderate right shoulder effusion. Superimposed degenerative changes are noted involving the glenohumeral articulation with chondrocalcinosis of the labrum and subchondral cyst formation within the glenoid. The visualized clavicle and the scapula appear intact. Visualized right thoracic cage appears intact. Ligaments Suboptimally assessed by CT. Muscles and Tendons  Integrity of the rotator cuff is not confirmed on this examination. Pectoralis minor and short head biceps insertions upon the coracoid appear intact. There is fatty atrophy of the supraspinatus. Otherwise normal muscle bulk. Soft tissues Emphysema.  Moderate right shoulder effusion. IMPRESSION: Impacted three-part fracture of the right humeral head involving the greater tuberosity and surgical neck of the humerus. Grossly anatomic alignment of fracture fragments. Anteroinferior glenohumeral subluxation, possibly related to moderate right shoulder effusion. Superimposed glenohumeral degenerative arthritis. Fatty atrophy of the supraspinatus. Integrity of the rotator cuff is not well assessed on this exam. Emphysema (ICD10-J43.9). Electronically Signed   By: Fidela Salisbury M.D.   On: 05/13/2021 00:32   DG Hip Unilat  With Pelvis 2-3 Views Right  Result Date: 05/12/2021 CLINICAL DATA:  Tripped and fell, right hip pain EXAM: DG HIP (WITH OR WITHOUT PELVIS) 2-3V RIGHT COMPARISON:  None. FINDINGS: Frontal view of the pelvis as well as frontal and cross-table lateral views of the right hip are obtained. There is an impacted subcapital right femoral neck fracture with varus angulation at the fracture site. No dislocation. Symmetrical bilateral hip joint space narrowing. Diffuse vascular calcifications. IMPRESSION: 1. Impacted subcapital right femoral neck fracture with varus angulation at the fracture site. Electronically Signed   By: Randa Ngo M.D.   On: 05/12/2021 22:24     Assessment and Plan:   Preoperative clearance:  -Patient received multiple doses of morphine prior to the interview, therefore was unable to provide majority of the history. -Per husband, patient is quite sedentary at home.  She denies any recent chest discomfort.  Husband does endorse worsening dyspnea on exertion over the past year.  She gets short of breath after walking 50 feet. -CT of the chest demonstrated COPD which could be a  potential cause behind her shortness of breath with exertion.  She also has multivessel CAD based on calcification on the CT image.  She had a remote stent to OM in 2008 however has not required any further treatment.  Last Myoview in 2016 was normal. -Pending echocardiogram.  EKG demonstrated sinus rhythm with new Q waves in the inferior lead when compared to 2019 EKG.  Patient does not remember any prolonged significant chest discomfort in the past year.  We will follow-up on wall motion  abnormality on echocardiogram. -unfortunately patient has both right proximal humerus fracture and right femoral fracture.  Based on surgery note, she is pending right hip hemiarthroplasty this afternoon.  Right humerus fracture likely will be treated in a staged fashion via surgery versus conservative management  -Patient need this hip surgery in order to regain some degree of functional ability.  I am not sure if Myoview will necessarily lower her overall risk, it likely will delay her procedure.  She is at least a moderate risk patient for the intended surgery.  Will discuss with MD regarding if further diagnostic studies are needed besides the echo.  Right femoral fracture: Plan for hemiarthroplasty this afternoon based on surgery note, pending cardiac clearance  Right proximal humerus fracture: Per orthopedic surgery note, Dr. Zachery Dakins will discuss with Dr. Griffin Basil regarding whether to treat right humerus fracture conservatively versus staged surgery at a later time.  Mechanical fall: Resulting in #2 and #3.  Tripped over a curb while getting out of a car.  Leukocytosis: White blood cell count 22 on arrival.  This is in the setting of mechanical fall that resulted in right femoral and humerus fracture. Tachycardic in the ED with HR 110s since arrival, given multiple doses of morphine.   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  This should be addressed as outpatient.  CAD: Previous stent to OM in 2008  Hypertension: Home felodipine has been restarted  Hyperlipidemia: Not on any statin at home.  Consider start Lipitor 80 mg daily.  History of CVA: No recent recurrence.  Carotid artery disease: Last carotid Doppler in March 2019 demonstrated mild disease bilaterally.   Risk Assessment/Risk Scores:                For questions or updates, please contact [place] Please consult [email or web address] for contact info under    Signed, Almyra Deforest, [place]  05/13/2021 8:13 AM   Patient seen and examined and agree with Almyra Deforest, PA as detailed above.  Patient somnolent on exam and majority of history obtained by the patient's daughter.  In brief, the patient is a 87 year old female with history of HTN, HLD, CAD s/p PCI to OM in 2008, prior CVA, carotid artery disease and squamous cell carcinoma of the lung s/p radiation therapy who presented after a mechanical fall with humeral fracture and impacted subcapital right femoral neck fracture now planned for possible THA. Cardiology is consulted for pre-operative evaluation.   Patient has known history of CAD s/p OM stenting in 2008 without repeat intervention since that time. Last myoview in 2016 was normal with no ischemia or infarction. Also with history of stroke in 2019 with unrevealing TEE. Was considered for loop recorder but this was ultimately deferred until her lung mass was further worked up. She has since been treated with radiation therapy for squamous cell carcinoma of the left lung and has not seen Cardiology since 2019.  She now presents with a mechanical fall with both humeral and femoral neck fractures. Prior to falling, the patient has not been mainly sedentary with reported dyspnea with exertion. No chest pain. Overall, it is difficult to discern if her symptoms are related to  underlying COPD/deconditioning vs cardiac etiology given known CAD and extensive coronary calcification noted on CT. Ultimately, she is a high risk surgical candidate due to advanced age and  medical comorbidities. She, however, would have significant debility if she were not to pursue surgery at this time and myoview would just delay the procedure without significantly altering management.  Will follow-up TTE and plan for medical optimization of CAD with ASA (can add post-OR), statin, ACE/ARB(post-OR) and BB therapy (post-OR) as tolerated.   Plan discussed with the patient and her daughter at bedside and they understand that she is high risk but also wish to proceed with surgery at this time.   GEN: Elderly female, somnolent but arousable to voice  Neck: Prominent respiratory variation in JVD Cardiac: RRR, no murmurs, rubs, or gallops.  Respiratory: Clear to auscultation bilaterally. GI: Soft, nontender, non-distended  MS: No edema; No deformity. Neuro:  Somnolent but grossly nonfocal Psych: Normal affect    Plan: -Follow-up TTE -Plan for medical optimization of CAD as myoview would delay procedure and not significantly alter management at this time -Patient is overall high risk for surgery/anesthesia given advanced age and comorbid conditions, however, not proceeding with surgical correction would lead to significant debility -Will start crestor and plan to optimize medical therapy post-OR -Will need follow-up and PET-CT imaging for squamous cell carcinoma as out-patient  Plan discussed with the patient and her daughter at bedside. They understand she is high risk and wish to pursue with surgical correction.   Gwyndolyn Kaufman, MD

## 2021-05-13 NOTE — Transfer of Care (Signed)
Immediate Anesthesia Transfer of Care Note  Patient: [name] [name]  Procedure(s) Performed: ARTHROPLASTY BIPOLAR HIP (HEMIARTHROPLASTY) (Right: Hip)  Patient Location: PACU  Anesthesia Type:General  Level of Consciousness: awake  Airway & Oxygen Therapy: Patient Spontanous Breathing and Patient connected to face mask oxygen  Post-op Assessment: Report given to RN, Post -op Vital signs reviewed and stable and Patient moving all extremities X 4  Post vital signs: Reviewed and stable  Last Vitals:  Vitals Value Taken Time  BP 132/53 05/13/21 1613  Temp    Pulse 75 05/13/21 1615  Resp 6 05/13/21 1615  SpO2 98 % 05/13/21 1615  Vitals shown include unvalidated device data.  Last Pain:  Vitals:   05/13/21 1322  TempSrc:   PainSc: 7          Complications: No notable events documented.

## 2021-05-13 NOTE — Discharge Instructions (Addendum)
Diet: As you were doing prior to hospitalization   Shower:  May shower but keep the wounds dry, use an occlusive plastic wrap, NO SOAKING IN TUB.  If the bandage gets wet, change with a clean dry gauze.  If you have a splint on, leave the splint in place and keep the splint dry with a plastic bag.  Dressing:  You may change your dressing 3-5 days after surgery, unless you have a splint.  If the dressing remains clean and dry it can also be left on until follow up. If you change the dressing replace with clean gauze and tape or ace wrap. If you have a splint, then just leave the splint in place and we will change your bandages during your first follow-up appointment.  If water gets in the splint or the splint gets saturated please call the clinic and we can see you to change your splint.  If you had hand or foot surgery, we will plan to remove your stitches in about 2 weeks in the office.  For all other surgeries, there are sticky tapes (steri-strips) on your wounds and all the stitches are absorbable.  Leave the steri-strips in place when changing your dressings, they will peel off with time, usually 2-3 weeks.  Activity:  Increase activity slowly as tolerated, but follow the weight bearing instructions below.  The rules on driving is that you can not be taking narcotics while you drive, and you must feel in control of the vehicle.    Weight Bearing:   Weight bearing as tolerated right lower extremity.  Right arm in slight. Okay to use arm with walker.  Blood clot prevention (DVT Prophylaxis): After surgery you are at an increased risk for a blood clot. you were prescribed a blood thinner, lovenox 40mg , to be taken once daily for a total of 4 weeks from surgery to help reduce your risk of getting a blood clot. This will help prevent a blood clot. Signs of a pulmonary embolus (blood clot in the lungs) include sudden short of breath, feeling lightheaded or dizzy, chest pain with a deep breath, rapid pulse  rapid breathing. Signs of a blood clot in your arms or legs include new unexplained swelling and cramping, warm, red or darkened skin around the painful area. Please call the office or 911 right away if these signs or symptoms develop. To prevent constipation: you may use a stool softener such as -  Colace (over the counter) 100 mg by mouth twice a day  Drink plenty of fluids (prune juice may be helpful) and high fiber foods Miralax (over the counter) for constipation as needed.    Itching:  If you experience itching with your medications, try taking only a single pain pill, or even half a pain pill at a time.  You may take up to 10 pain pills per day, and you can also use benadryl over the counter for itching or also to help with sleep.   Precautions:  If you experience chest pain or shortness of breath - call 911 immediately for transfer to the hospital emergency department!!   Call office [phone]) for the following: Temperature greater than 101F Persistent nausea and vomiting Severe uncontrolled pain Redness, tenderness, or signs of infection (pain, swelling, redness, odor or green/yellow discharge around the site) Difficulty breathing, headache or visual disturbances Hives Persistent dizziness or light-headedness Extreme fatigue Any other questions or concerns you may have after discharge  In an emergency, call 911 or go to  an Emergency Department at a nearby hospital  Follow- Up Appointment:  Please call for an appointment to be seen approximately 2-3 week after surgery in [facility] with your surgeon Dr. Charlies Constable - [phone] Address: [address] [place], [place], [place] [address]

## 2021-05-13 NOTE — H&P (View-Only) (Signed)
ORTHOPAEDIC CONSULTATION  REQUESTING PHYSICIAN: Phillips Grout, MD  Chief Complaint: Fall with right proximal humerus and right femoral neck fracture  HPI: [name] [name] is a 87 y.o. female who was going to a restaurant with her husband yesterday when she fell getting out of the car and landing onto her right side.  She had pain in the right shoulder and right hip after the fall and inability to ambulate.  She was brought to the emergency room x-rays demonstrated displaced right femoral neck fracture and right proximal humerus fracture.  Patient is primarily a household ambulator using a walker and wheelchair.  She has a history of a stroke 2 years ago which has resulted in generalized decreased weakness and worsening gait/balance, but no other previous falls prior to yesterday.  She denies distal numbness and tingling.   Past Medical History:  Diagnosis Date   Anxiety    pt denies   Arthritis    back, legs   CAD (coronary artery disease) 2008   s/p Taxus Element Perseus Study stent (DES) to OM1 in 08/2006; EF 65%   HTN (hypertension)    Hypercholesterolemia    Ischemic stroke [facility])    Mass of lower lobe of left lung 08/2017   Pneumonia 5/16   hospitalized for 4 days   Past Surgical History:  Procedure Laterality Date   APPENDECTOMY  1950   BRONCHOSCOPY  08/03/2017   BIOPSY   CORONARY ANGIOPLASTY WITH STENT PLACEMENT     FUDUCIAL PLACEMENT Left 08/03/2017   Procedure: PLACEMENT OF FUDUCIAL;  Surgeon: Collene Gobble, MD;  Location: [facility] OR;  Service: Thoracic;  Laterality: Left;   GANGLION CYST EXCISION Right 12/26/2014   Procedure: EXCISON OF RIGHT FOOT GANGLION CYST;  Surgeon: Wylene Simmer, MD;  Location: [facility];  Service: Orthopedics;  Laterality: Right;   IR RADIOLOGIST EVAL & MGMT  05/16/2018   lower back surgery  [place]   PCI of hte lesion in th marginal branch circumflex artery     TEE WITHOUT CARDIOVERSION N/A  06/21/2017   Procedure: TRANSESOPHAGEAL ECHOCARDIOGRAM (TEE);  Surgeon: Acie Fredrickson, Wonda Cheng, MD;  Location: [facility] ENDOSCOPY;  Service: Cardiovascular;  Laterality: N/A;   VIDEO BRONCHOSCOPY WITH ENDOBRONCHIAL NAVIGATION N/A 08/03/2017   Procedure: VIDEO BRONCHOSCOPY WITH ENDOBRONCHIAL NAVIGATION;  Surgeon: Collene Gobble, MD;  Location: [facility] OR;  Service: Thoracic;  Laterality: N/A;   Social History   Socioeconomic History   Marital status: Married    Spouse name: Not on file   Number of children: Not on file   Years of education: Not on file   Highest education level: Not on file  Occupational History   Not on file  Tobacco Use   Smoking status: Former    Packs/day: 1.00    Years: 40.00    Pack years: 40.00    Types: Cigarettes    Quit date: 2008    Years since quitting: 15.1   Smokeless tobacco: Never  Vaping Use   Vaping Use: Never used  Substance and Sexual Activity   Alcohol use: No   Drug use: No   Sexual activity: Not on file  Other Topics Concern   Not on file  Social History Narrative   Not on file   Social Determinants of Health   Financial Resource Strain: Not on file  Food Insecurity: Not on file  Transportation Needs: Not on file  Physical Activity: Not on file  Stress: Not on file  Social Connections: Not on file   Family History  Problem Relation Age of Onset   Heart failure Mother    Hypertension Mother    Allergies  Allergen Reactions   Other Anaphylaxis and Swelling    Food or preservative allergy of unknown/undetermined origin (happened last in 2017 when the patient had gone out to eat and hadn't yet touched her steak??)     Positive ROS: All other systems have been reviewed and were otherwise negative with the exception of those mentioned in the HPI and as above.  Physical Exam: General: Alert, no acute distress Cardiovascular: No pedal edema Respiratory: No cyanosis, no use of accessory musculature Skin: No lesions in the area of chief  complaint Neurologic: Sensation intact distally Psychiatric: Patient is competent for consent with normal mood and affect  MUSCULOSKELETAL:  RLE No traumatic wounds, ecchymosis, or rash  Tender about the hip nontender about the knee and ankle  No knee or ankle effusion  Sens DPN, SPN, TN intact  Motor EHL, ext, flex 5/5  DP 2+, PT 2+, No significant edema LLE No traumatic wounds, ecchymosis, or rash  Nontender  No groin pain with log roll  No knee or ankle effusion  Knee stable to varus/ valgus stress  Sens DPN, SPN, TN intact  Motor EHL, ext, flex 5/5  DP 2+, PT 2+, No significant edema  RUE No traumatic wounds, mild ecchymosis in the axilla, mild swelling about the shoulder  Tender about the shoulder, nontender about the elbow and wrist No elbow or wrist effusion  Sens median, radial, ulnar intact  Motor AIN, PIN, IO intact  Radial pulse 2+, No significant edema LUE No traumatic wounds, ecchymosis, or rash  Nontender No elbow or wrist effusion  Sens median, radial, ulnar intact  Motor AIN, PIN, IO intact  Radial pulse 2+, No significant edema   IMAGING: X-rays demonstrate an impacted displaced right proximal humerus fracture, and varus displaced right femoral neck fracture  Assessment: Principal Problem:   Closed displaced fracture of right femoral neck (HCC) Active Problems:   Essential hypertension   Coronary atherosclerosis   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  Discussed that in regards to the right hip she has a femoral neck fracture not amenable to fixation.  Discussed arthroplasty with Hemi versus total arthroplasty.  Given the patient's age, activity level, and no prodromal hip pain or symptoms I feel she would be a good candidate for  hemiarthroplasty.  We will plan to do the surgery today pending medical clearance.  The risks benefits and alternatives were discussed with the patient including but not limited to the risks of nonoperative treatment, versus surgical intervention including infection, bleeding, nerve injury, periprosthetic fracture, the need for revision surgery, dislocation, leg length discrepancy, blood clots, cardiopulmonary complications, morbidity, mortality, among others, and they were willing to proceed.     In regards to the right shoulder she has a proximal humerus fracture likely indicated for fixation.  Will discuss with Dr. Griffin Basil regarding operative versus nonoperative surgical treatment options.  Logistically may be challenging to perform both the hip hemiarthroplasty and shoulder surgery during the same trip to the OR. The shoulder surgery may have to be staged for later this week after the hip surgery.  Willaim Sheng, MD  Contact information:   [id number] 7am-5pm epic message Dr. Zachery Dakins, or call office for patient follow up: [phone] After hours and holidays please check [email or web address] for group call information for [facility]

## 2021-05-13 NOTE — Assessment & Plan Note (Signed)
Stent on 08 NSVT in 2019 No symptoms, but concerned given new Q wave findings on EKG today compared to 2019. 2d echo ordered And message sent to P. [facility] for cards eval in AM for pre-op clearance

## 2021-05-13 NOTE — ED Notes (Signed)
Patient transported to CT 

## 2021-05-13 NOTE — ED Notes (Addendum)
This nurse entered pt's room to assess pt. Pt alert and oriented to self alone, however, able to follow commands. Pt's husband at the bedside states he believes her "confusion" is secondary to pt receiving Morphine 4mg  at 0539 this morning. Pt HR tachycardic at 104bpm, however VS are otherwise stable. Hospitalist notified and aware. No additional orders at this time. Will continue to monitor.

## 2021-05-13 NOTE — Op Note (Signed)
05/12/2021 - 05/13/2021  3:43 PM  PATIENT:  [name] [name]   MRN: [id number]  PRE-OPERATIVE DIAGNOSIS:  Right displaced femoral neck fracture  POST-OPERATIVE DIAGNOSIS:  Right displaced femoral neck fracture  PROCEDURE:  Procedure(s): ARTHROPLASTY BIPOLAR HIP (HEMIARTHROPLASTY)  PREOPERATIVE INDICATIONS:  [name] [name] is an 87 y.o. female who was admitted 05/12/2021 with a diagnosis of Closed displaced fracture of right femoral neck ([place]) and elected for surgical management.  The risks benefits and alternatives were discussed with the patient including but not limited to the risks of nonoperative treatment, versus surgical intervention including infection, bleeding, nerve injury, periprosthetic fracture, the need for revision surgery, dislocation, leg length discrepancy, blood clots, cardiopulmonary complications, morbidity, mortality, among others, and they were willing to proceed.  Predicted outcome is good, although there will be at least a six to nine month expected recovery.   OPERATIVE REPORT     SURGEON:  Charlies Constable, MD    ASSISTANT: Izola Price, RNFA (Present throughout the entire procedure,  necessary for completion of procedure in a timely manner, assisting with retraction, instrumentation, and closure)     ANESTHESIA: General  ESTIMATED BLOOD LOSS: 035 cc    COMPLICATIONS:  None.      COMPONENTS:  Stryker Accolade C cemented size five 127 degree, 28+0 cobalt chrome inner ball, 44 mm bipolar outer ball Implant Name Type Inv. Item Serial No. Manufacturer Lot No. LRB No. Used Action  CEMENTED HIP STAIN    [employer] [id number] Right 1 Implanted  CEMENT BONE SIMPLEX SPEEDSET - [id number] Cement CEMENT BONE SIMPLEX SPEEDSET  [employer] [id number] Right 2 Implanted  HEAD OSTEO BIPOLAR - [id number] Orthopedic Implant HEAD OSTEO BIPOLAR  [employer] [id number] Right 1 Implanted  KIT BONE PREP STRYKER - [id number] Miscellaneous KIT BONE PREP STRYKER  STRYKER INSTRUMENTS  [id number] Right 1 Implanted  FEMORAL HEAD LFIT V40 28MM PL0 - [id number] Orthopedic Implant FEMORAL HEAD LFIT V40 28MM PL0  [employer] [id number] Right 1 Implanted      PROCEDURE IN DETAIL: The patient was met in the holding area and identified.  The appropriate hip  was marked at the operative site. The patient was then transported to the OR and  placed under anesthesia.  At that point, the patient was  placed in the lateral decubitus position with the operative side up and  secured to the operating room table and all bony prominences padded. A subaxillary role was placed.    The operative lower extremity was prepped from the iliac crest to the ankle.  Sterile draping was performed.  2g of ancef and 1g TXA were given prior to incision. Time out was performed prior to incision.      A routine posterolateral approach was utilized via sharp dissection  carried down to the subcutaneous tissue.  Gross bleeders were Bovie  coagulated.  The iliotibial band was identified and incised  along the length of the skin incision.  A Charnley retractor was inserted with care to protect the sciatic nerve.  With the hip internally rotated, the short external rotators  were identified. The piriformis was tagged with #5 Ethibond, and the hip capsule released in a T-type fashion, and posterior sleeve of the capsule was also tagged.  The femoral neck was exposed, and I resected the femoral neck using the appropriate jig. This was performed at approximately a thumb's breadth above the lesser trochanter.    I then exposed the deep acetabulum, cleared out any tissue including the ligamentum teres.    I  then prepared the proximal femur using the box cutter, Charnley awl, and then sequentially broached.  A trial utilized, and I reduced the hip, leg lengths were assessed clinically and felt to be equal. The hip was then taken through a full range of motion, the hip was stable at full extension and 90 degrees external  rotation without anterior subluxation. The hip was also stable in the position of sleep, and in neutral abduction up to 90 degrees flexion, and 80 degrees IR. The trial components were then removed.   We then prepared canal for cementation.  The cement restrictor was measured and inserted distally.  The canal was then irrigated with the pulse lavage and 3 L of normal saline.  2 bags of Simplex cement were prepared.  Using the cement gun the cement was inserted distally and the canal was filled.  We then pressurized the canal. The real implant was then inserted matching the patient's native anteversion of approximately 30 degrees.  We then waited for 13 minutes for the cement to be fully set.  Excess cement was removed.  A lap was placed in the acetabulum prior to cementing was also removed and the acetabulum was assessed to make sure there was no cement or bone fragments.  The hip was then reduced with the trial head again and taken through functional range of motion and found to have excellent stability. Leg lengths were restored. The real head was then impacted onto the stem and the hip was again reduced.  The capsule was then repaired with #2 Ethibond., and the piriformis was repaired to the abductor tendon.  His. Excellent posterior capsular repair was achieved.   I then irrigated the hip copiously again with pulse lavage. The wounds were injected with 20cc exparal diluted in sterile saline. The fascia and IT band was repaired with #1 stratafix, followed by 0 stratafix for the fat layer followed by 2-0 Vicryl and running 3-0 Monocryl for the skin, Dermabond was applied and an Aquacel dressing was placed.  The patient was then awakened and returned to PACU in stable and satisfactory condition. There were no complications.  Post op recs: WB: WBAT with posterior hip precautions x6 weeks Abx: ancef x23 hours post op Imaging: PACU xrays Dressing: Aquacel dressing to be kept intact until follow-up DVT  prophylaxis: lovenox starting POD1 x4 weeks Follow up: 2 weeks after surgery for a wound check with Dr. Zachery Dakins at [facility].  Address: [address] [place], [place], [place] [address]  Office Phone: [phone]   Charlies Constable, MD Orthopedic Surgeon  05/13/2021 3:43 PM

## 2021-05-13 NOTE — Progress Notes (Signed)
Patient seen and evaluated.  As needed pain meds ordered.  Going to the OR this afternoon for hip repair.  Husband at bedside updated.

## 2021-05-13 NOTE — Progress Notes (Signed)
Limited echocardiogram performed for pre op clearance.  [name] [name] RDCS

## 2021-05-13 NOTE — Anesthesia Preprocedure Evaluation (Addendum)
Anesthesia Evaluation  Patient identified by MRN, date of birth, ID band Patient awake    Reviewed: Allergy & Precautions, NPO status , Patient's Chart, lab work & pertinent test results  Airway Mallampati: II  TM Distance: >3 FB Neck ROM: Full    Dental  (+) Edentulous Upper, Edentulous Lower   Pulmonary former smoker,  Lung mass, left lower lobe; not a surgical candidate    + decreased breath sounds      Cardiovascular hypertension, + CAD and + Cardiac Stents   Rhythm:Regular Rate:Tachycardia     Neuro/Psych Anxiety CVA    GI/Hepatic negative GI ROS, Neg liver ROS,   Endo/Other  negative endocrine ROS  Renal/GU negative Renal ROS  negative genitourinary   Musculoskeletal  (+) Arthritis , Osteoarthritis,  Neck and back surgery   Abdominal   Peds negative pediatric ROS (+)  Hematology negative hematology ROS (+)   Anesthesia Other Findings Bruising/swelling of right upper extremity  Reproductive/Obstetrics negative OB ROS                            Anesthesia Physical Anesthesia Plan  ASA: 4 and emergent  Anesthesia Plan: General   Post-op Pain Management:    Induction: Intravenous  PONV Risk Score and Plan: 3 and Treatment may vary due to age or medical condition, Ondansetron, Propofol infusion and Dexamethasone  Airway Management Planned: Oral ETT  Additional Equipment: None  Intra-op Plan:   Post-operative Plan: Extubation in OR  Informed Consent: I have reviewed the patients History and Physical, chart, labs and discussed the procedure including the risks, benefits and alternatives for the proposed anesthesia with the patient or authorized representative who has indicated his/her understanding and acceptance.     Dental advisory given and Consent reviewed with POA  Plan Discussed with: Anesthesiologist and CRNA  Anesthesia Plan Comments: (Patient is high risk from a  cardiopulmonary standpoint. Normal EF. Given h/o back surgery, current femoral and humeral fractures, will plan on GETA as unable to position patient for spinal. Phenylephrine gtt available. BIS. Propofol gtt. Norton Blizzard, MD  )      Anesthesia Quick Evaluation

## 2021-05-14 LAB — CBC
HCT: 30.1 % — ABNORMAL LOW (ref 36.0–46.0)
Hemoglobin: 9.8 g/dL — ABNORMAL LOW (ref 12.0–15.0)
MCH: 29.3 pg (ref 26.0–34.0)
MCHC: 32.6 g/dL (ref 30.0–36.0)
MCV: 90.1 fL (ref 80.0–100.0)
Platelets: 174 10*3/uL (ref 150–400)
RBC: 3.34 MIL/uL — ABNORMAL LOW (ref 3.87–5.11)
RDW: 13.5 % (ref 11.5–15.5)
WBC: 21.1 10*3/uL — ABNORMAL HIGH (ref 4.0–10.5)
nRBC: 0 % (ref 0.0–0.2)

## 2021-05-14 LAB — BASIC METABOLIC PANEL
Anion gap: 7 (ref 5–15)
BUN: 17 mg/dL (ref 8–23)
CO2: 26 mmol/L (ref 22–32)
Calcium: 8.4 mg/dL — ABNORMAL LOW (ref 8.9–10.3)
Chloride: 103 mmol/L (ref 98–111)
Creatinine, Ser: 0.89 mg/dL (ref 0.44–1.00)
GFR, Estimated: >60 mL/min (ref >60)
Glucose, Bld: 129 mg/dL — ABNORMAL HIGH (ref 70–99)
Potassium: 4.3 mmol/L (ref 3.5–5.1)
Sodium: 136 mmol/L (ref 135–145)

## 2021-05-14 MED ORDER — ENSURE ENLIVE PO LIQD
237.0000 mL | Freq: Two times a day (BID) | ORAL | Status: DC
  Administered 2021-05-14 – 2021-05-19 (×7): 237 mL via ORAL

## 2021-05-14 NOTE — Progress Notes (Signed)
° °  HPI: [name] [name] is a 87 y.o. female with medical history significant of stroke, HTN, CAD s/p stent in 2008, NSCLC of LLL, s/p radiation in 2019-2020 in remission / improved as of CT chest in 2021.  Pt had mechanical fall while getting out of car at home today.  Fell on R side, severe R hip and shoulder pain post fall, inability to ambulate.  Brought into the ED.  Pain persistent in ED.  Subjective Leg feels better after operation yesterday.  Daughter and husband at bedside   Physical Exam: Vitals:   05/13/21 2125 05/14/21 0057 05/14/21 0603 05/14/21 0916  BP: 122/70 (!) 123/59 105/68 112/81  Pulse: 96 97 80 93  Resp: 16 16 16 16   Temp: 98.6 F (37 C) 98.4 F (36.9 C) 99.4 F (37.4 C)   TempSrc: Oral Oral Oral   SpO2: 95% 93% 95% 96%  Weight:      Height:       Constitutional: Alert no apparent distress mildly confused Eyes: PERRL, lids and conjunctivae normal ENMT: Mucous membranes are moist. Posterior pharynx clear of any exudate or lesions.Normal dentition.  Neck: normal, supple, no masses, no thyromegaly Respiratory: clear to auscultation bilaterally, no wheezing, no crackles. Normal respiratory effort. No accessory muscle use.  Cardiovascular: Regular rate and rhythm, no murmurs / rubs / gallops. No extremity edema. 2+ pedal pulses. No carotid bruits.  Abdomen: no tenderness, no masses palpated. No hepatosplenomegaly. Bowel sounds positive.  Musculoskeletal: Right shoulder in sling skin: no rashes, lesions, ulcers. No induration Neurologic: CN 2-12 grossly intact. Sensation intact, DTR normal. Strength 5/5 in all 4.  Psychiatric: Normal judgment and insight. Alert and oriented x 3. Normal mood.    Data Reviewed:  R shoulder and R hip fractures  Assessment and Plan: * Closed displaced fracture of right femoral neck ([place])- (present on admission) Ortho consulted Hip fx pathway NPO, IVF Morphine PRN pain EKG shows inferior Q waves that appear to be new since  2019. Will order 2d echo for AM. And send message to cardiology given the EKG findings and need for surgical risk stratification pre-op. Will also order CT chest given h/o lung CA and shes almost certainly overdue. Surgical repair 05/13/2021  Closed fracture of right shoulder- (present on admission) Immobilize shoulder for the moment CT performed Surgical repair 05/15/2021 planned  Malignant 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 ([place])- (present on admission) S/p radiation in late 2019, early 2020. Apparently in remission at this time. CXR neg for any obvious mass findings. Last CT of chest showed improvement in tumor and no progression as of 2021. Probably overdue for repeat CT chest, ill order this w/o contrast just to make sure there isnt evidence of cancer all over the place.  Coronary atherosclerosis- (present on admission) Stent on 08 NSVT in 2019 No symptoms, but concerned given new Q wave findings on EKG today compared to 2019. 2d echo ordered And message sent to [facility] for cards eval in AM for pre-op clearance  Essential hypertension- (present on admission) Cont felodipine.   N.p.o. after midnight for OR tomorrow again for humeral repair.  Family updated at bedside   Author: Phillips Grout, MD 05/14/2021 9:32 AM

## 2021-05-14 NOTE — Plan of Care (Signed)
°  Problem: Clinical Measurements: Goal: Will remain free from infection Outcome: Progressing   Problem: Clinical Measurements: Goal: Diagnostic test results will improve Outcome: Progressing   Problem: Clinical Measurements: Goal: Respiratory complications will improve Outcome: Progressing   Problem: Nutrition: Goal: Adequate nutrition will be maintained Outcome: Progressing   Problem: Pain Managment: Goal: General experience of comfort will improve Outcome: Progressing   Problem: Skin Integrity: Goal: Risk for impaired skin integrity will decrease Outcome: Progressing

## 2021-05-14 NOTE — Progress Notes (Addendum)
ORTHOPAEDIC PROGRESS NOTE  s/p Procedure(s): ARTHROPLASTY BIPOLAR HIP (HEMIARTHROPLASTY) on 05/13/2021 with Dr. Zachery Dakins   SUBJECTIVE: Reports mild pain about operative site. She continues to have pain in her right shoulder. She has been in a sling as instructed. She is right hand dominant. She uses a walker at baseline. Her husband is at bedside. They are concerned about her ability to use a walker with her shoulder injury. No chest pain. No SOB. No nausea/vomiting. No other complaints.  OBJECTIVE: PE: General: resting in hospital bed, NAD RUE: swelling and bruising of the right upper extremity as to be expected, she endorses axillary nerve sensation which she states is symmetric to the contralateral side, was able to get a flicker of deltoid motor function, she endorses distal sensation, she is able to flex and extend all fingers, warm well perfused hand RLE: dressing CDI, tender to palpation right hip, leg lengths equal, intact EHL/TA/GSC, warm well perfused foot,    Vitals:   05/14/21 0057 05/14/21 0603  BP: (!) 123/59 105/68  Pulse: 97 80  Resp: 16 16  Temp: 98.4 F (36.9 C) 99.4 F (37.4 C)  SpO2: 93% 95%   Stable post-op images  CT scan of right shoulder demonstrates an impacted displaced right proximal humerus fracture  ASSESSMENT: [name] [name] is a 87 y.o. female  - s/p right hemiarthroplasty on 05/13/2021 with Dr. Zachery Dakins - displaced right proximal humerus fracture  PLAN: Patient underwent surgery for her right hip yesterday. She did well with this. We discussed her right proximal humerus fracture. Due to the nature of the injury, it is unlikely to heal with non-operative measures. She would have to remain nonweightbearing in a sling for a prolonged period of time and may have limited function of her right arm afterwards. She requires the use of a walker even prior to her injury and with her humerus fracture, this will prolong her ability to weight bear with a  walker. We discussed operative fixation of the right proximal humerus fracture which would be a reverse total shoulder arthroplasty. This gives her the ability to use her walker. This would get her weight bearing sooner instead of keeping her in the bed as she is unable to weight bear on her lower extremity without assistance.   The risks benefits and alternatives were discussed with the patient and her husband including but not limited to the risks of nonoperative treatment, versus surgical intervention including infection, bleeding, nerve injury, periprosthetic fracture, the need for revision surgery, blood clots, cardiopulmonary complications, morbidity, mortality, among others, and they were willing to proceed.    We additionally specifically discussed risks of axillary nerve injury, infection, periprosthetic fracture, continued pain and longevity of implants prior to beginning procedure.    Body mass index is 19.39 kg/m.  Lab Results  Component Value Date   ALBUMIN 3.7 11/14/2017     Patient does not have a diagnosis of diabetes.    .    Social History   Tobacco Use  Smoking Status Former   Packs/day: 1.00   Years: 40.00   Pack years: 40.00   Types: Cigarettes   Quit date: 2008   Years since quitting: 15.1  Smokeless Tobacco Never   Counseling given: Not Answered   Patient and her husband are in agreement with this. We will plan for surgical fixation of her right proximal humerus fracture on Friday, 2/10 with Dr. Griffin Basil.   NPO at midnight  Weightbearing:   - RLE: WBAT  - RUE: NWB  in sling Insicional and dressing care: Reinforce dressings as needed Orthopedic device(s): Sling Showering: Hold for now VTE prophylaxis: Lovenox Pain control: PRN pain medications Follow - up plan: TBD Contact information:  Dr. Ophelia Charter, Noemi Chapel PA-C, After hours and holidays please check [email or web address] for group call information for [facility]  Noemi Chapel,  PA-C 05/14/2021

## 2021-05-14 NOTE — H&P (View-Only) (Signed)
ORTHOPAEDIC PROGRESS NOTE  s/p Procedure(s): ARTHROPLASTY BIPOLAR HIP (HEMIARTHROPLASTY) on 05/13/2021 with Dr. Zachery Dakins   SUBJECTIVE: Reports mild pain about operative site. She continues to have pain in her right shoulder. She has been in a sling as instructed. She is right hand dominant. She uses a walker at baseline. Her husband is at bedside. They are concerned about her ability to use a walker with her shoulder injury. No chest pain. No SOB. No nausea/vomiting. No other complaints.  OBJECTIVE: PE: General: resting in hospital bed, NAD RUE: swelling and bruising of the right upper extremity as to be expected, she endorses axillary nerve sensation which she states is symmetric to the contralateral side, was able to get a flicker of deltoid motor function, she endorses distal sensation, she is able to flex and extend all fingers, warm well perfused hand RLE: dressing CDI, tender to palpation right hip, leg lengths equal, intact EHL/TA/GSC, warm well perfused foot,    Vitals:   05/14/21 0057 05/14/21 0603  BP: (!) 123/59 105/68  Pulse: 97 80  Resp: 16 16  Temp: 98.4 F (36.9 C) 99.4 F (37.4 C)  SpO2: 93% 95%   Stable post-op images  CT scan of right shoulder demonstrates an impacted displaced right proximal humerus fracture  ASSESSMENT: [name] [name] is a 87 y.o. female  - s/p right hemiarthroplasty on 05/13/2021 with Dr. Zachery Dakins - displaced right proximal humerus fracture  PLAN: Patient underwent surgery for her right hip yesterday. She did well with this. We discussed her right proximal humerus fracture. Due to the nature of the injury, it is unlikely to heal with non-operative measures. She would have to remain nonweightbearing in a sling for a prolonged period of time and may have limited function of her right arm afterwards. She requires the use of a walker even prior to her injury and with her humerus fracture, this will prolong her ability to weight bear with a  walker. We discussed operative fixation of the right proximal humerus fracture which would be a reverse total shoulder arthroplasty. This gives her the ability to use her walker. This would get her weight bearing sooner instead of keeping her in the bed as she is unable to weight bear on her lower extremity without assistance.   The risks benefits and alternatives were discussed with the patient and her husband including but not limited to the risks of nonoperative treatment, versus surgical intervention including infection, bleeding, nerve injury, periprosthetic fracture, the need for revision surgery, blood clots, cardiopulmonary complications, morbidity, mortality, among others, and they were willing to proceed.    We additionally specifically discussed risks of axillary nerve injury, infection, periprosthetic fracture, continued pain and longevity of implants prior to beginning procedure.    Body mass index is 19.39 kg/m.  Lab Results  Component Value Date   ALBUMIN 3.7 11/14/2017     Patient does not have a diagnosis of diabetes.    .    Social History   Tobacco Use  Smoking Status Former   Packs/day: 1.00   Years: 40.00   Pack years: 40.00   Types: Cigarettes   Quit date: 2008   Years since quitting: 15.1  Smokeless Tobacco Never   Counseling given: Not Answered   Patient and her husband are in agreement with this. We will plan for surgical fixation of her right proximal humerus fracture on Friday, 2/10 with Dr. Griffin Basil.   NPO at midnight  Weightbearing:   - RLE: WBAT  - RUE: NWB  in sling Insicional and dressing care: Reinforce dressings as needed Orthopedic device(s): Sling Showering: Hold for now VTE prophylaxis: Lovenox Pain control: PRN pain medications Follow - up plan: TBD Contact information:  Dr. Ophelia Charter, Noemi Chapel PA-C, After hours and holidays please check [email or web address] for group call information for [facility]  Noemi Chapel,  PA-C 05/14/2021

## 2021-05-14 NOTE — Progress Notes (Signed)
Initial Nutrition Assessment  DOCUMENTATION CODES:   Non-severe (moderate) malnutrition in context of chronic illness  INTERVENTION:  - will order Ensure Plus High Protein BID, each supplement provides 350 kcal and 20 grams of protein. - staff to provide feeding assistance if family is not present.    NUTRITION DIAGNOSIS:   Moderate Malnutrition related to chronic illness (stroke, NSCLC s/p treatment and currently in remission) as evidenced by mild fat depletion, moderate muscle depletion.  GOAL:   Patient will meet greater than or equal to 90% of their needs  MONITOR:   PO intake, Supplement acceptance, Labs, Weight trends, Skin  REASON FOR ASSESSMENT:   Consult Hip fracture protocol  ASSESSMENT:   87 y.o. female with medical history of ischemic stroke, HTN, CAD s/p stent in 2008, NSCLC of LLL s/p radiation in 2019-2020 in remission / improved as of CT chest in 2021, hypercholesterolemia, and anxiety. She experienced a mechanical fall while getting out of the car at home. She fell onto her R side and had subsequent severe R hip and R shoulder pain and was unable to ambulate. She was dx with closed displaced fx of R femoral neck and on 2/8 underwent R hemiarthroplasty.  Patient is POD #1. Diet advanced from NPO to Regular, thin liquids yesterday at 1850. No documented intakes since that time.  Patient laying in bed with daughter and granddaughter at bedside. Patient noted to be confused and flow sheet documentation indicates a/o to self only.   Daughter and granddaughter provide information.  Patient is R-handed at baseline. She was able to lift a cup to drink from a straw using her L hand this AM. Her daughter assisted in feeding her breakfast. She ate some of the meal but not much. They are planning to order an early lunch to include grilled cheese.  Discussed current diet order and allowance of foods being brought in from outside of the hospital.   She sometimes drinks  chocolate Ensure or Boost at home and family is agreeable to RD placing order for this during hospitalization.   PTA her appetite was at baseline which is small portions at meals; she has not been a big eater for several years.  Weight yesterday documented as 106 lb which is around UBW. UBW for several years has been around 100 lb. Documentation in the chart indicates weight has been stable since 08/2017.   Labs reviewed; Ca: 8.4 mg/dl.  Medications reviewed; 1 tablet multivitamin with minerals/day.   IVF; NS @ 125 ml/hr.    NUTRITION - FOCUSED PHYSICAL EXAM:  Flowsheet Row Most Recent Value  Orbital Region No depletion  Upper Arm Region Moderate depletion  Thoracic and Lumbar Region Unable to assess  Buccal Region Mild depletion  Temple Region Mild depletion  Clavicle Bone Region Moderate depletion  Clavicle and Acromion Bone Region Moderate depletion  Scapular Bone Region Mild depletion  Dorsal Hand Mild depletion  Patellar Region Moderate depletion  Anterior Thigh Region Moderate depletion  Posterior Calf Region Moderate depletion  Edema (RD Assessment) Mild  [RUE and RLE]  Hair Reviewed  Eyes Reviewed  Mouth Reviewed  [dentures]  Skin Reviewed  Nails Unable to assess  [nail polish]       Diet Order:   Diet Order             Diet regular Room service appropriate? Yes; Fluid consistency: Thin  Diet effective now  EDUCATION NEEDS:   Education needs have been addressed  Skin:  Skin Assessment: Skin Integrity Issues: Skin Integrity Issues:: Incisions Incisions: R hip (2/8)  Last BM:  PTA/unknown  Height:   Ht Readings from Last 1 Encounters:  05/13/21 5\' 2"  (1.575 m)    Weight:   Wt Readings from Last 1 Encounters:  05/13/21 48.1 kg     BMI:  Body mass index is 19.39 kg/m.   Estimated Nutritional Needs:  Kcal:  1400-1600 kcal Protein:  70-85 grams Fluid:  >/= 1.6 L/day     Jarome Matin, MS, RD, LDN Inpatient  Clinical Dietitian RD pager # available in Center  After hours/weekend pager # available in [facility]

## 2021-05-15 ENCOUNTER — Inpatient Hospital Stay (HOSPITAL_COMMUNITY): Payer: Medicare Other | Admitting: Certified Registered Nurse Anesthetist

## 2021-05-15 ENCOUNTER — Other Ambulatory Visit: Payer: Self-pay

## 2021-05-15 ENCOUNTER — Encounter (HOSPITAL_COMMUNITY): Payer: Self-pay | Admitting: Internal Medicine

## 2021-05-15 ENCOUNTER — Inpatient Hospital Stay (HOSPITAL_COMMUNITY): Payer: Medicare Other

## 2021-05-15 ENCOUNTER — Encounter (HOSPITAL_COMMUNITY)
Admission: EM | Disposition: A | Discharge status: Skilled Nursing Facility | Payer: Self-pay | Source: Home / Self Care | Attending: Internal Medicine

## 2021-05-15 DIAGNOSIS — S42201A Unspecified fracture of upper end of right humerus, initial encounter for closed fracture: Secondary | ICD-10-CM

## 2021-05-15 DIAGNOSIS — S72001A Fracture of unspecified part of neck of right femur, initial encounter for closed fracture: Secondary | ICD-10-CM

## 2021-05-15 DIAGNOSIS — I2510 Atherosclerotic heart disease of native coronary artery without angina pectoris: Secondary | ICD-10-CM

## 2021-05-15 DIAGNOSIS — I10 Essential (primary) hypertension: Secondary | ICD-10-CM

## 2021-05-15 HISTORY — PX: REVERSE SHOULDER ARTHROPLASTY: SHX5054

## 2021-05-15 LAB — CBC
HCT: 31.6 % — ABNORMAL LOW (ref 36.0–46.0)
Hemoglobin: 9.7 g/dL — ABNORMAL LOW (ref 12.0–15.0)
MCH: 29.5 pg (ref 26.0–34.0)
MCHC: 30.7 g/dL (ref 30.0–36.0)
MCV: 96 fL (ref 80.0–100.0)
Platelets: 189 10*3/uL (ref 150–400)
RBC: 3.29 MIL/uL — ABNORMAL LOW (ref 3.87–5.11)
RDW: 13.8 % (ref 11.5–15.5)
WBC: 17.3 10*3/uL — ABNORMAL HIGH (ref 4.0–10.5)
nRBC: 0 % (ref 0.0–0.2)

## 2021-05-15 LAB — CREATININE, SERUM
Creatinine, Ser: 0.49 mg/dL (ref 0.44–1.00)
GFR, Estimated: >60 mL/min (ref >60)

## 2021-05-15 SURGERY — ARTHROPLASTY, SHOULDER, TOTAL, REVERSE
Anesthesia: General | Site: Shoulder | Laterality: Right

## 2021-05-15 MED ORDER — PHENYLEPHRINE 40 MCG/ML (10ML) SYRINGE FOR IV PUSH (FOR BLOOD PRESSURE SUPPORT)
PREFILLED_SYRINGE | INTRAVENOUS | Status: DC | PRN
  Administered 2021-05-15 (×3): 120 ug via INTRAVENOUS

## 2021-05-15 MED ORDER — TRANEXAMIC ACID-NACL 1000-0.7 MG/100ML-% IV SOLN
1000.0000 mg | INTRAVENOUS | Status: AC
  Administered 2021-05-15: 1000 mg via INTRAVENOUS
  Filled 2021-05-15: qty 100

## 2021-05-15 MED ORDER — ENOXAPARIN SODIUM 40 MG/0.4ML IJ SOSY
40.0000 mg | PREFILLED_SYRINGE | INTRAMUSCULAR | Status: DC
  Administered 2021-05-16 – 2021-05-19 (×4): 40 mg via SUBCUTANEOUS
  Filled 2021-05-15 (×4): qty 0.4

## 2021-05-15 MED ORDER — PHENYLEPHRINE HCL-NACL 20-0.9 MG/250ML-% IV SOLN
INTRAVENOUS | Status: DC | PRN
Start: 2021-05-15 — End: 2021-05-15
  Administered 2021-05-15: 60 ug/min via INTRAVENOUS

## 2021-05-15 MED ORDER — DEXAMETHASONE SODIUM PHOSPHATE 10 MG/ML IJ SOLN
INTRAMUSCULAR | Status: AC
  Filled 2021-05-15: qty 1

## 2021-05-15 MED ORDER — RACEPINEPHRINE HCL 2.25 % IN NEBU
0.2500 mL | INHALATION_SOLUTION | Freq: Once | RESPIRATORY_TRACT | Status: DC
  Filled 2021-05-15: qty 0.5

## 2021-05-15 MED ORDER — ALBUTEROL SULFATE (2.5 MG/3ML) 0.083% IN NEBU
INHALATION_SOLUTION | RESPIRATORY_TRACT | Status: AC
  Administered 2021-05-15: 2.5 mg via RESPIRATORY_TRACT
  Filled 2021-05-15: qty 3

## 2021-05-15 MED ORDER — BUPIVACAINE-EPINEPHRINE 0.25% -1:200000 IJ SOLN
INTRAMUSCULAR | Status: DC | PRN
  Administered 2021-05-15: 30 mL

## 2021-05-15 MED ORDER — ALBUTEROL SULFATE (2.5 MG/3ML) 0.083% IN NEBU: 2.5000 mg | INHALATION_SOLUTION | RESPIRATORY_TRACT | Status: DC | PRN

## 2021-05-15 MED ORDER — OXYCODONE HCL 5 MG/5ML PO SOLN: 5.0000 mg | Freq: Once | ORAL | Status: DC | PRN

## 2021-05-15 MED ORDER — ACETAMINOPHEN 500 MG PO TABS
1000.0000 mg | ORAL_TABLET | Freq: Once | ORAL | Status: AC
  Administered 2021-05-15: 1000 mg via ORAL
  Filled 2021-05-15: qty 2

## 2021-05-15 MED ORDER — DIPHENHYDRAMINE HCL 12.5 MG/5ML PO ELIX: 12.5000 mg | ORAL_SOLUTION | ORAL | Status: DC | PRN

## 2021-05-15 MED ORDER — OXYCODONE HCL 5 MG PO TABS: 5.0000 mg | ORAL_TABLET | Freq: Once | ORAL | Status: DC | PRN

## 2021-05-15 MED ORDER — BUPIVACAINE-EPINEPHRINE (PF) 0.25% -1:200000 IJ SOLN
INTRAMUSCULAR | Status: AC
  Filled 2021-05-15: qty 30

## 2021-05-15 MED ORDER — VANCOMYCIN HCL 1000 MG IV SOLR
INTRAVENOUS | Status: AC
  Filled 2021-05-15: qty 20

## 2021-05-15 MED ORDER — PHENYLEPHRINE 40 MCG/ML (10ML) SYRINGE FOR IV PUSH (FOR BLOOD PRESSURE SUPPORT)
PREFILLED_SYRINGE | INTRAVENOUS | Status: AC
  Filled 2021-05-15: qty 30

## 2021-05-15 MED ORDER — ROCURONIUM BROMIDE 10 MG/ML (PF) SYRINGE
PREFILLED_SYRINGE | INTRAVENOUS | Status: DC | PRN
  Administered 2021-05-15: 50 mg via INTRAVENOUS

## 2021-05-15 MED ORDER — LABETALOL HCL 5 MG/ML IV SOLN
INTRAVENOUS | Status: AC
  Filled 2021-05-15: qty 4

## 2021-05-15 MED ORDER — CHLORHEXIDINE GLUCONATE 0.12 % MT SOLN
15.0000 mL | Freq: Once | OROMUCOSAL | Status: AC
  Administered 2021-05-15: 15 mL via OROMUCOSAL

## 2021-05-15 MED ORDER — PHENYLEPHRINE HCL (PRESSORS) 10 MG/ML IV SOLN
INTRAVENOUS | Status: AC
  Filled 2021-05-15: qty 1

## 2021-05-15 MED ORDER — EPHEDRINE 5 MG/ML INJ
INTRAVENOUS | Status: AC
  Filled 2021-05-15: qty 10

## 2021-05-15 MED ORDER — ONDANSETRON HCL 4 MG/2ML IJ SOLN
INTRAMUSCULAR | Status: AC
  Filled 2021-05-15: qty 2

## 2021-05-15 MED ORDER — LIDOCAINE HCL (PF) 2 % IJ SOLN
INTRAMUSCULAR | Status: AC
  Filled 2021-05-15: qty 5

## 2021-05-15 MED ORDER — FENTANYL CITRATE (PF) 100 MCG/2ML IJ SOLN
INTRAMUSCULAR | Status: DC | PRN
  Administered 2021-05-15: 50 ug via INTRAVENOUS

## 2021-05-15 MED ORDER — CEFAZOLIN SODIUM-DEXTROSE 1-4 GM/50ML-% IV SOLN
1.0000 g | Freq: Four times a day (QID) | INTRAVENOUS | Status: AC
  Administered 2021-05-15 – 2021-05-16 (×3): 1 g via INTRAVENOUS
  Filled 2021-05-15 (×3): qty 50

## 2021-05-15 MED ORDER — SUGAMMADEX SODIUM 200 MG/2ML IV SOLN
INTRAVENOUS | Status: DC | PRN
  Administered 2021-05-15: 200 mg via INTRAVENOUS

## 2021-05-15 MED ORDER — ONDANSETRON HCL 4 MG PO TABS: 4.0000 mg | ORAL_TABLET | Freq: Four times a day (QID) | ORAL | Status: DC | PRN

## 2021-05-15 MED ORDER — HYDROMORPHONE HCL 1 MG/ML IJ SOLN
0.2500 mg | INTRAMUSCULAR | Status: DC | PRN
  Administered 2021-05-15 (×2): 0.25 mg via INTRAVENOUS

## 2021-05-15 MED ORDER — FENTANYL CITRATE (PF) 100 MCG/2ML IJ SOLN
INTRAMUSCULAR | Status: AC
  Filled 2021-05-15: qty 2

## 2021-05-15 MED ORDER — 0.9 % SODIUM CHLORIDE (POUR BTL) OPTIME
TOPICAL | Status: DC | PRN
  Administered 2021-05-15: 1000 mL

## 2021-05-15 MED ORDER — ONDANSETRON HCL 4 MG/2ML IJ SOLN
4.0000 mg | Freq: Four times a day (QID) | INTRAMUSCULAR | Status: DC | PRN
  Administered 2021-05-16: 4 mg via INTRAVENOUS
  Filled 2021-05-15: qty 2

## 2021-05-15 MED ORDER — PHENOL 1.4 % MT LIQD: 1.0000 | OROMUCOSAL | Status: DC | PRN

## 2021-05-15 MED ORDER — VANCOMYCIN HCL 1 G IV SOLR
INTRAVENOUS | Status: DC | PRN
  Administered 2021-05-15: 1000 mg

## 2021-05-15 MED ORDER — PROPOFOL 10 MG/ML IV BOLUS
INTRAVENOUS | Status: DC | PRN
  Administered 2021-05-15: 100 mg via INTRAVENOUS

## 2021-05-15 MED ORDER — DEXAMETHASONE SODIUM PHOSPHATE 10 MG/ML IJ SOLN
INTRAMUSCULAR | Status: DC | PRN
Start: 2021-05-15 — End: 2021-05-15
  Administered 2021-05-15: 4 mg via INTRAVENOUS

## 2021-05-15 MED ORDER — LIDOCAINE 2% (20 MG/ML) 5 ML SYRINGE
INTRAMUSCULAR | Status: DC | PRN
  Administered 2021-05-15: 40 mg via INTRAVENOUS

## 2021-05-15 MED ORDER — BISACODYL 10 MG RE SUPP
10.0000 mg | Freq: Every day | RECTAL | Status: DC | PRN
  Administered 2021-05-19: 10 mg via RECTAL
  Filled 2021-05-15: qty 1

## 2021-05-15 MED ORDER — POVIDONE-IODINE 10 % EX SWAB
2.0000 "application " | Freq: Once | CUTANEOUS | Status: AC
  Administered 2021-05-15: 2 via TOPICAL

## 2021-05-15 MED ORDER — ACETAMINOPHEN 500 MG PO TABS
500.0000 mg | ORAL_TABLET | Freq: Four times a day (QID) | ORAL | Status: AC
  Administered 2021-05-15 – 2021-05-16 (×3): 500 mg via ORAL
  Filled 2021-05-15 (×3): qty 1

## 2021-05-15 MED ORDER — CEFAZOLIN SODIUM-DEXTROSE 2-4 GM/100ML-% IV SOLN
2.0000 g | INTRAVENOUS | Status: AC
  Administered 2021-05-15: 2 g via INTRAVENOUS
  Filled 2021-05-15: qty 100

## 2021-05-15 MED ORDER — MIDAZOLAM HCL 2 MG/2ML IJ SOLN: 1.0000 mg | INTRAMUSCULAR | Status: DC

## 2021-05-15 MED ORDER — ROCURONIUM BROMIDE 10 MG/ML (PF) SYRINGE
PREFILLED_SYRINGE | INTRAVENOUS | Status: AC
  Filled 2021-05-15: qty 10

## 2021-05-15 MED ORDER — PROMETHAZINE HCL 25 MG/ML IJ SOLN: 6.2500 mg | INTRAMUSCULAR | Status: DC | PRN

## 2021-05-15 MED ORDER — ONDANSETRON HCL 4 MG/2ML IJ SOLN
INTRAMUSCULAR | Status: DC | PRN
  Administered 2021-05-15: 4 mg via INTRAVENOUS

## 2021-05-15 MED ORDER — AMISULPRIDE (ANTIEMETIC) 5 MG/2ML IV SOLN: 10.0000 mg | Freq: Once | INTRAVENOUS | Status: DC | PRN

## 2021-05-15 MED ORDER — SODIUM CHLORIDE 0.9 % IR SOLN
Status: DC | PRN
  Administered 2021-05-15: 1000 mL

## 2021-05-15 MED ORDER — STERILE WATER FOR IRRIGATION IR SOLN
Status: DC | PRN
  Administered 2021-05-15: 2000 mL

## 2021-05-15 MED ORDER — HYDROMORPHONE HCL 1 MG/ML IJ SOLN
INTRAMUSCULAR | Status: AC
  Filled 2021-05-15: qty 1

## 2021-05-15 MED ORDER — FENTANYL CITRATE PF 50 MCG/ML IJ SOSY
50.0000 ug | PREFILLED_SYRINGE | INTRAMUSCULAR | Status: DC
  Filled 2021-05-15: qty 2

## 2021-05-15 MED ORDER — POLYETHYLENE GLYCOL 3350 17 G PO PACK
17.0000 g | PACK | Freq: Every day | ORAL | Status: DC | PRN
  Administered 2021-05-18 – 2021-05-19 (×2): 17 g via ORAL
  Filled 2021-05-15 (×2): qty 1

## 2021-05-15 MED ORDER — DOCUSATE SODIUM 100 MG PO CAPS
100.0000 mg | ORAL_CAPSULE | Freq: Two times a day (BID) | ORAL | Status: DC
  Administered 2021-05-15 – 2021-05-19 (×8): 100 mg via ORAL
  Filled 2021-05-15 (×8): qty 1

## 2021-05-15 MED ORDER — PROPOFOL 10 MG/ML IV BOLUS
INTRAVENOUS | Status: AC
  Filled 2021-05-15: qty 20

## 2021-05-15 MED ORDER — LACTATED RINGERS IV SOLN: INTRAVENOUS | Status: DC

## 2021-05-15 MED ORDER — MENTHOL 3 MG MT LOZG: 1.0000 | LOZENGE | OROMUCOSAL | Status: DC | PRN

## 2021-05-15 MED ORDER — RACEPINEPHRINE HCL 2.25 % IN NEBU
0.5000 mL | INHALATION_SOLUTION | Freq: Once | RESPIRATORY_TRACT | Status: AC
  Administered 2021-05-15: 0.5 mL via RESPIRATORY_TRACT
  Filled 2021-05-15: qty 0.5

## 2021-05-15 SURGICAL SUPPLY — 78 items
AID PSTN UNV HD RSTRNT DISP (MISCELLANEOUS) ×1
APL PRP STRL LF DISP 70% ISPRP (MISCELLANEOUS) ×2
BAG COUNTER SPONGE SURGICOUNT (BAG) ×2 IMPLANT
BAG SPNG CNTER NS LX DISP (BAG) ×1
BASEPLATE GLENOSPHERE 25 STD (Miscellaneous) ×1 IMPLANT
BIT DRILL 3.2 PERIPHERAL SCREW (BIT) ×1 IMPLANT
BLADE SAW SAG 73X25 THK (BLADE) ×1
BLADE SAW SGTL 73X25 THK (BLADE) ×1 IMPLANT
BODY PROXIMAL PTC 13X132.5 (Joint) IMPLANT
BSPLAT GLND STD 25 RVRS SHLDR (Miscellaneous) ×1 IMPLANT
CAP LOCKING COCR (Cap) ×1 IMPLANT
CHLORAPREP W/TINT 26 (MISCELLANEOUS) ×4 IMPLANT
CLSR STERI-STRIP ANTIMIC 1/2X4 (GAUZE/BANDAGES/DRESSINGS) ×2 IMPLANT
COOLER ICEMAN CLASSIC (MISCELLANEOUS) IMPLANT
COVER BACK TABLE 60X90IN (DRAPES) IMPLANT
COVER SURGICAL LIGHT HANDLE (MISCELLANEOUS) ×2 IMPLANT
DRAPE C-ARM 42X120 X-RAY (DRAPES) IMPLANT
DRAPE INCISE IOBAN 66X45 STRL (DRAPES) ×2 IMPLANT
DRAPE ORTHO SPLIT 77X108 STRL (DRAPES) ×4
DRAPE SHEET LG 3/4 BI-LAMINATE (DRAPES) ×4 IMPLANT
DRAPE SURG ORHT 6 SPLT 77X108 (DRAPES) ×2 IMPLANT
DRSG AQUACEL AG ADV 3.5X 6 (GAUZE/BANDAGES/DRESSINGS) ×2 IMPLANT
ELECT BLADE TIP CTD 4 INCH (ELECTRODE) ×2 IMPLANT
ELECT PENCIL ROCKER SW 15FT (MISCELLANEOUS) IMPLANT
ELECT REM PT RETURN 15FT ADLT (MISCELLANEOUS) ×2 IMPLANT
FACESHIELD WRAPAROUND (MASK) ×2 IMPLANT
FACESHIELD WRAPAROUND OR TEAM (MASK) ×1 IMPLANT
GLENOSPHERE STANDARD 39 (Joint) ×2 IMPLANT
GLENOSPHERE STD 39 (Joint) IMPLANT
GLOVE SRG 8 PF TXTR STRL LF DI (GLOVE) ×1 IMPLANT
GLOVE SURG ENC MOIS LTX SZ6.5 (GLOVE) ×4 IMPLANT
GLOVE SURG NEOPR MICRO LF SZ8 (GLOVE) ×4 IMPLANT
GLOVE SURG UNDER POLY LF SZ6.5 (GLOVE) ×2 IMPLANT
GLOVE SURG UNDER POLY LF SZ8 (GLOVE) ×2
GOWN STRL REUS W/TWL LRG LVL3 (GOWN DISPOSABLE) ×2 IMPLANT
GOWN STRL REUS W/TWL XL LVL3 (GOWN DISPOSABLE) ×2 IMPLANT
GUIDEWIRE GLENOID 2.5X220 (WIRE) ×1 IMPLANT
HANDPIECE INTERPULSE COAX TIP (DISPOSABLE) ×2
IMPL REVERSE SHOULDER 0X3.5 (Shoulder) IMPLANT
IMPLANT REVERSE SHOULDER 0X3.5 (Shoulder) ×2 IMPLANT
INSERT SHOULDER REVERSED (Miscellaneous) IMPLANT
KIT BASIN OR (CUSTOM PROCEDURE TRAY) ×2 IMPLANT
KIT STABILIZATION SHOULDER (MISCELLANEOUS) ×2 IMPLANT
KIT TURNOVER KIT A (KITS) IMPLANT
MANIFOLD NEPTUNE II (INSTRUMENTS) ×2 IMPLANT
NDL MAYO CATGUT SZ4 TPR NDL (NEEDLE) IMPLANT
NEEDLE MAYO CATGUT SZ4 (NEEDLE) IMPLANT
NS IRRIG 1000ML POUR BTL (IV SOLUTION) ×2 IMPLANT
PACK SHOULDER (CUSTOM PROCEDURE TRAY) ×2 IMPLANT
PAD COLD SHLDR WRAP-ON (PAD) ×1 IMPLANT
PROXIMAL BODY PTC 13X132.5 (Joint) ×2 IMPLANT
RESTRAINT HEAD UNIVERSAL NS (MISCELLANEOUS) ×2 IMPLANT
REVERSED INSERT SHOULDER (Miscellaneous) ×2 IMPLANT
SCREW 5.0X38 SMALL F/PERFORM (Screw) ×1 IMPLANT
SCREW ASSEMBLY COCR TYPE 0 (Screw) ×1 IMPLANT
SCREW BONE 6.5X40 SM (Screw) ×1 IMPLANT
SCREW PERIPHERAL 5.0X34 (Screw) ×1 IMPLANT
SET HNDPC FAN SPRY TIP SCT (DISPOSABLE) ×1 IMPLANT
SLING ULTRA II L (ORTHOPEDIC SUPPLIES) IMPLANT
SLING ULTRA II S (ORTHOPEDIC SUPPLIES) ×1 IMPLANT
SLING ULTRA III MED (ORTHOPEDIC SUPPLIES) ×1 IMPLANT
SPONGE T-LAP 18X18 ~~LOC~~+RFID (SPONGE) ×2 IMPLANT
SPONGE T-LAP 4X18 ~~LOC~~+RFID (SPONGE) ×2 IMPLANT
STEM HUM PTC DISTAL 13X130 (Miscellaneous) ×1 IMPLANT
STRIP CLOSURE SKIN 1/2X4 (GAUZE/BANDAGES/DRESSINGS) ×1 IMPLANT
SUCTION FRAZIER HANDLE 12FR (TUBING) ×1
SUCTION TUBE FRAZIER 12FR DISP (TUBING) ×1 IMPLANT
SUT ETHIBOND 2 V 37 (SUTURE) ×2 IMPLANT
SUT ETHIBOND NAB CT1 #1 30IN (SUTURE) ×2 IMPLANT
SUT FIBERWIRE #5 38 CONV NDL (SUTURE)
SUT MNCRL AB 4-0 PS2 18 (SUTURE) ×2 IMPLANT
SUT VIC AB 0 CT1 36 (SUTURE) IMPLANT
SUT VIC AB 3-0 SH 27 (SUTURE) ×4
SUT VIC AB 3-0 SH 27X BRD (SUTURE) ×1 IMPLANT
SUTURE FIBERWR #5 38 CONV NDL (SUTURE) IMPLANT
TOWEL OR 17X26 10 PK STRL BLUE (TOWEL DISPOSABLE) ×2 IMPLANT
TUBE SUCTION HIGH CAP CLEAR NV (SUCTIONS) ×2 IMPLANT
WATER STERILE IRR 1000ML POUR (IV SOLUTION) ×4 IMPLANT

## 2021-05-15 NOTE — Op Note (Signed)
Orthopaedic Surgery Operative Note (CSN: [id number])  [name] [name]  21-Apr-1934 Date of Surgery: 05/12/2021 - 05/15/2021   Diagnoses:  Right proximal humerus fracture with ipsilateral hip fracture  Procedure: R reverse Total Shoulder Arthroplasty ORIF tuberosities   Operative Finding Successful completion of planned procedure.  Due to the patient's hip fracture we advised that operative fixation of her right shoulder may help her with mobility if she is able to use her arm for a walker and will use it for ADLs at waist level.  No range of motion of the shoulder as far as therapy goes for 4 weeks  Action of tug test was normal, tuberosity repair was robust.  Post-operative plan: The patient will be NWB in sling.  The patient will be will be admitted for mobilization, medical optimization and possibly the need for short term rehabilitation in an inpatient setting due to medical issues and fragility fracture.  DVT prophylaxis Aspirin 81 mg twice daily for 6 weeks.  Pain control with PRN pain medication preferring oral medicines.  Follow up plan will be scheduled in approximately 7 days for incision check and XR.  Physical therapy to start after 4 weeks but patient can be out of sling for ADLs at waist level as well as using her walker  Implants: Tornier size 13 revive stem by 130 mm, standard 13 proximal body, 0 high offset tray with a +9 polyethylene, 39 glenosphere standard with a 25 standard baseplate and 40 center screw with 2 peripheral locking screws  Post-Op Diagnosis: Same Surgeons:Primary: Hiram Gash, MD Assistants:Caroline McBane PA-C Location: [facility] Anesthesia: General with Exparel Interscalene Antibiotics: Ancef 2g preop, Vancomycin $RemoveBeforeDEI'1000mg'iWsItZGOeHoYBBLi$  locally Operative time: 37 minutes Tourniquet time: None Estimated Blood Loss: 846 Complications: None Specimens: None Implants: Implant Name Type Inv. Item Serial No. Manufacturer Lot No. LRB No. Used Action  BASEPLATE GLENOSPHERE  65LD STD - [id number] Miscellaneous BASEPLATE GLENOSPHERE 03ES STD [id number] [employer]  Right 1 Implanted  GLENOSPHERE STANDARD 39 - [id number] Joint GLENOSPHERE STANDARD 39 [id number] [employer]  Right 1 Implanted  SCREW BONE 6.5X40 SM - [id number] Screw SCREW BONE 6.5X40 SM  [employer]  Right 1 Implanted  SCREW PERIPHERAL 5.0X34 - [id number] Screw SCREW PERIPHERAL 5.0X34  [employer]  Right 1 Implanted  SCREW 5.0X38 SMALL F/PERFORM - [id number] Screw SCREW 5.0X38 SMALL F/PERFORM  [employer]  Right 1 Implanted  SCREW ASSEMBLY COCR TYPE 0 - [id number] Screw SCREW ASSEMBLY COCR TYPE 0 [id number] [employer]  Right 1 Implanted  REVERSED INSERT SHOULDER - [id number] Miscellaneous REVERSED INSERT SHOULDER [id number] [employer]  Right 1 Implanted  STEM HUM PTC DISTAL 13X130 - [id number] Miscellaneous STEM HUM PTC DISTAL 13X130 [id number] [employer]  Right 1 Implanted  PROXIMAL BODY PTC 13X132.5 - [id number] Joint PROXIMAL BODY PTC 13X132.5 [id number] [employer]  Right 1 Implanted  CAP LOCKING COCR - [id number] Cap CAP LOCKING COCR [id number] [employer]  Right 1 Implanted  IMPLANT REVERSE SHOULDER 0X3.5 - [id number] Shoulder IMPLANT REVERSE SHOULDER 0X3.5 [id number] [employer]  Right 1 Implanted    Indications for Surgery:   [name] [name] is a 87 y.o. female with fall resulting in ipsilateral hip fracture and proximal humerus fracture.  Due to mobility reasons we felt that operative fixation in the form of arthroplasty would be appropriate to aid in overall function as well as early mobility with a walker.  Benefits and risks of operative and nonoperative management were discussed prior to surgery with patient/guardian(s) and informed consent form was  completed.  Infection and need for further surgery were discussed as was prosthetic stability and cuff issues.  We additionally specifically discussed risks of axillary nerve injury, infection, periprosthetic fracture, continued  pain and longevity of implants prior to beginning procedure.      Procedure:   The patient was identified in the preoperative holding area where the surgical site was marked. Block placed by anesthesia with exparel.  The patient was taken to the OR where a procedural timeout was called and the above noted anesthesia was induced.  The patient was positioned beachchair on allen table with spider arm positioner.  Preoperative antibiotics were dosed.  The patient's right shoulder was prepped and draped in the usual sterile fashion.  A second preoperative timeout was called.       Standard deltopectoral approach was performed with a #10 blade. We dissected down to the subcutaneous tissues and the cephalic vein was taken laterally with the deltoid. Clavipectoral fascia was incised in line with the incision. Deep retractors were placed. The long of the biceps tendon was identified and there was significant tenosynovitis present.  Tenodesis was performed to the pectoralis tendon with #2 Ethibond. The remaining biceps was followed up into the rotator interval where it was released.    We used the bicipital groove as a landmark for the lesser and greater tuberosity fragments.  We were able to mobilize the lesser tuberosity fragment and placed stay sutures in the bone tendon junction to help with mobilization.  This point we were able to identify the  greater tuberosity fragment and 4 #5  FiberWire sutures were used to place into this for eventual repair of the tuberosities.  Once these were both mobilized we took care to identify the shaft fragment as well as the head fragment.  We carefully identified the head fragment were able to manually remove it.  At this point the axillary nerve was found and palpated and with a tug test noted to be intact.  Protected throughout the remainder of the case with blunt retractors.    We then released the SGHL with bovie cautery prior to placing a curved mayo at the junction of  the anterior glenoid well above the axillary nerve and bluntly dissecting the subscapularis from the capsule.  We then carefully protected the axillary nerve as we gently released the inferior capsule to fully mobilize the subscapularis.  An anterior deltoid retractor was then placed as well as a small Hohmann retractor superiorly.   The glenoid was relatively preserved as we would expect in this fracture patient.  The remaining labrum was removed circumferentially taking great care not to disrupt the posterior capsule.    The glenoid drill guide was placed and used to drill a guide pin in the center, inferior position. The glenoid face was then reamed concentrically over the guide wire. The center hole was drilled over the guidepin in a near anatomic angle of version. Next the glenoid vault was drilled back to a depth of 40 mm.  We tapped and then placed a 17mm size baseplate with 0 lateralization was selected with a 6.5 mm x 66mm length central screw.  The base plate was screwed into the glenoid vault obtaining secure fixation. We next placed superior and inferior locking screws for additional fixation.  Next a 39 mm glenosphere was selected and impacted onto the baseplate. The center screw was tightened.   We then repositioned the arm to give access to the humeral shaft fragment.  Drill holes were  placed and fiberwire sutures in the shaft for vertical fixation of the tuberosities.  We broached with the Revive stem implants  starting with a size 9 reamer and reaming up to 13 which obtained an appropriate fit.  The proximal body was sized separately and attached to trial and achieve a stable articulation.    We trialed with multiple size tray and polyethylene options and selected a 0 high which provided good stability and range of motion without excess soft tissue tension. The offset was dialed in to match the normal anatomy. The shoulder was trialed.  There was good ROM in all planes and the shoulder was  stable with no inferior translation.   We then mobilized her tuberosities again and placed the anterior deep limbs of the 4 #5 fiber wires around the stem.  1 of these was tied down fixing the greater tuberosity in place after bone graft harvest from the humeral head component was placed underneath.  A +0 high offset tray was selected and impacted onto the stem.   A 39+9 polyethylene liner was impacted onto the stem.  The joint was reduced and thoroughly irrigated with pulsatile lavage. The remaining sutures were then placed through the subscapularis and the bone tendon junction and the tuberosities were reduced after bone graft placed beneath as autograft at the subscap.  We horizontally secured the tuberosities before placing vertical fixation with the suture that was placed into the shaft.  Tuberosities moved as a unit were happy with her overall reduction.  This was checked on fluoroscopy confirming our position.  We irrigated copiously at this point.  Hemostasis was obtained. The deltopectoral interval was reapproximated with #1 Ethibond. The subcutaneous tissues were closed with 3-0 Vicryl and the skin was closed with running monocryl.     The wounds were cleaned and dried and an Aquacel dressing was placed. The drapes taken down. The arm was placed into sling with abduction pillow. Patient was awakened, extubated, and transferred to the recovery room in stable condition. There were no intraoperative complications. The sponge, needle, and attention counts were correct at the end of the case.     Noemi Chapel, PA-C, present and scrubbed throughout the case, critical for completion in a timely fashion, and for retraction, instrumentation, closure.

## 2021-05-15 NOTE — Progress Notes (Signed)
° °  HPI: [name] [name] is a 87 y.o. female with medical history significant of stroke, HTN, CAD s/p stent in 2008, NSCLC of LLL, s/p radiation in 2019-2020 in remission / improved as of CT chest in 2021.  Pt had mechanical fall while getting out of car at home today.  Fell on R side, severe R hip and shoulder pain post fall, inability to ambulate.  Brought into the ED.  Pain persistent in ED.  Subjective 2 granddaughters and daughter at bedside.  Patient to go to the OR today for repair of shoulder.  Physical Exam: Vitals:   05/15/21 0617 05/15/21 0655 05/15/21 0708 05/15/21 0807  BP: (!) 116/59     Pulse: 84     Resp: 17 (!) 22 18   Temp: 98 F (36.7 C)     TempSrc: Oral     SpO2: 97% 90% 96% 97%  Weight:      Height:       Constitutional: Alert no apparent distress mildly confused Eyes: PERRL, lids and conjunctivae normal ENMT: Mucous membranes are moist. Posterior pharynx clear of any exudate or lesions.Normal dentition.  Neck: normal, supple, no masses, no thyromegaly Respiratory: clear to auscultation bilaterally, no wheezing, no crackles. Normal respiratory effort. No accessory muscle use.  Cardiovascular: Regular rate and rhythm, no murmurs / rubs / gallops. No extremity edema. 2+ pedal pulses. No carotid bruits.  Abdomen: no tenderness, no masses palpated. No hepatosplenomegaly. Bowel sounds positive.  Musculoskeletal: Right shoulder in sling  skin: no rashes, lesions, ulcers. No induration Neurologic: CN 2-12 grossly intact. Sensation intact, DTR normal. Strength 5/5 in all 4.  Psychiatric: Normal judgment and insight. Alert and oriented x 3. Normal mood.    Data Reviewed:  R shoulder and R hip fractures  Assessment and Plan: * Closed displaced fracture of right femoral neck ([place])- (present on admission) Ortho consulted Hip fx pathway NPO, IVF Morphine PRN pain EKG shows inferior Q waves that appear to be new since 2019. Limited echo normal EF And send message  to cardiology given the EKG findings and need for surgical risk stratification pre-op. Surgical repair 05/13/2021  Closed fracture of right shoulder- (present on admission) Immobilize shoulder for the moment CT performed Surgical repair 05/15/2021   Malignant 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 ([place])- (present on admission) S/p radiation in late 2019, early 2020. Apparently in remission at this time. CXR neg for any obvious mass findings. Last CT of chest showed improvement in tumor and no progression as of 2021. Overdue for repeat CT chest lung cancer monitoring.  Done here and unfortunately shows recurrent disease.  Patient and family not updated of this yet.  But will certainly need to be addressed prior to discharge after recovery from surgery   coronary atherosclerosis- (present on admission) Stent on 08 NSVT in 2019 No symptoms, but concerned given new Q wave findings on EKG today compared to 2019. 2d echo ordered And message sent to [facility] for cards eval in AM for pre-op clearance  Essential hypertension- (present on admission) Cont felodipine.   Author: Phillips Grout, MD 05/15/2021 9:24 AM

## 2021-05-15 NOTE — Progress Notes (Signed)
Spoke to [employer]. Received report for surgery this afternoon.

## 2021-05-15 NOTE — Transfer of Care (Signed)
Immediate Anesthesia Transfer of Care Note  Patient: [name] [name]  Procedure(s) Performed: REVERSE SHOULDER ARTHROPLASTY (Right: Shoulder)  Patient Location: PACU  Anesthesia Type:General  Level of Consciousness: drowsy  Airway & Oxygen Therapy: Patient Spontanous Breathing and Patient connected to nasal cannula oxygen  Post-op Assessment: Report given to RN and Post -op Vital signs reviewed and stable  Post vital signs: Reviewed and stable  Last Vitals:  Vitals Value Taken Time  BP 132/57 05/15/21 1539  Temp    Pulse 98 05/15/21 1542  Resp 17 05/15/21 1542  SpO2 99 % 05/15/21 1542  Vitals shown include unvalidated device data.  Last Pain:  Vitals:   05/15/21 1241  TempSrc: Oral  PainSc:       Patients Stated Pain Goal: 2 (45/14/60 4799)  Complications: No notable events documented.

## 2021-05-15 NOTE — Interval H&P Note (Signed)
Call questions answered, we will proceed with surgery to help with mobility, and I am for patient to be able to use arm for Walker if bone quality allows it.

## 2021-05-15 NOTE — Progress Notes (Signed)
PT Cancellation Note  Patient Details Name: [name] [name] MRN: [id number] DOB: 02-24-1935   Cancelled Treatment:     PT order received but eval deferred this date.  Pt on OR schedule for R shoulder surgery this date.  Will follow.   [name] 05/15/2021, 11:04 AM

## 2021-05-15 NOTE — Plan of Care (Signed)
  Problem: Activity: Goal: Risk for activity intolerance will decrease Outcome: Progressing   Problem: Pain Managment: Goal: General experience of comfort will improve Outcome: Progressing   Problem: Safety: Goal: Ability to remain free from injury will improve Outcome: Progressing   

## 2021-05-15 NOTE — Anesthesia Postprocedure Evaluation (Signed)
Anesthesia Post Note  Patient: [name] [name]  Procedure(s) Performed: REVERSE SHOULDER ARTHROPLASTY (Right: Shoulder)     Patient location during evaluation: PACU Anesthesia Type: General Level of consciousness: awake and alert Pain management: pain level controlled Vital Signs Assessment: post-procedure vital signs reviewed and stable Respiratory status: spontaneous breathing, nonlabored ventilation and respiratory function stable Cardiovascular status: blood pressure returned to baseline and stable Postop Assessment: no apparent nausea or vomiting Anesthetic complications: no   No notable events documented.  Last Vitals:  Vitals:   05/15/21 1645 05/15/21 1704  BP: (!) 127/54 130/63  Pulse: 81 92  Resp: 13 16  Temp:  36.9 C  SpO2: 97% 99%    Last Pain:  Vitals:   05/15/21 1645  TempSrc:   PainSc: Asleep                 Lynda Rainwater

## 2021-05-15 NOTE — Anesthesia Procedure Notes (Addendum)
Procedure Name: Intubation Date/Time: 05/15/2021 1:48 PM Performed by: Milford Cage, CRNA Pre-anesthesia Checklist: Patient identified, Emergency Drugs available, Suction available and Patient being monitored Patient Re-evaluated:Patient Re-evaluated prior to induction Oxygen Delivery Method: Circle system utilized Preoxygenation: Pre-oxygenation with 100% oxygen Induction Type: IV induction Ventilation: Mask ventilation without difficulty Laryngoscope Size: Mac and 3 Grade View: Grade I Tube type: Oral Tube size: 7.0 mm Number of attempts: 1 Airway Equipment and Method: Stylet Placement Confirmation: ETT inserted through vocal cords under direct vision, positive ETCO2 and breath sounds checked- equal and bilateral Secured at: 21 cm Tube secured with: Tape Dental Injury: Teeth and Oropharynx as per pre-operative assessment

## 2021-05-15 NOTE — Progress Notes (Addendum)
Rn attempted to notify provider [place] at [phone] due pt having increased shortness of breath and stridor in upper airway. Pt also was tight and wheezy in lungs bilaterally with respiratory rate of 22. Respiratory therapy called and pt given breathing treatment. Pt remains on 02. We did have to increase this to 4l [place] at time of shortness of breath and wheezing episode and pt 02 rose from 90 to 96 percent. Pt did report pain and discomfort in right hip and medication was given. Pt remained overall stable with this episode. Dr. Zachery Dakins to see pt and breathing treatment orders. Rn will continue to monitor.

## 2021-05-15 NOTE — Anesthesia Preprocedure Evaluation (Signed)
Anesthesia Evaluation  Patient identified by MRN, date of birth, ID band Patient awake    Reviewed: Allergy & Precautions, NPO status , Patient's Chart, lab work & pertinent test results  Airway Mallampati: II  TM Distance: >3 FB Neck ROM: Full    Dental  (+) Edentulous Upper, Edentulous Lower   Pulmonary former smoker,  Lung mass, left lower lobe; not a surgical candidate    + decreased breath sounds      Cardiovascular hypertension, + CAD and + Cardiac Stents   Rhythm:Regular Rate:Tachycardia     Neuro/Psych Anxiety CVA    GI/Hepatic negative GI ROS, Neg liver ROS,   Endo/Other  negative endocrine ROS  Renal/GU negative Renal ROS  negative genitourinary   Musculoskeletal  (+) Arthritis , Osteoarthritis,  Neck and back surgery   Abdominal   Peds negative pediatric ROS (+)  Hematology negative hematology ROS (+)   Anesthesia Other Findings Bruising/swelling of right upper extremity  Reproductive/Obstetrics negative OB ROS                             Anesthesia Physical  Anesthesia Plan  ASA: 3  Anesthesia Plan: General   Post-op Pain Management: Regional block   Induction: Intravenous  PONV Risk Score and Plan: 3 and Treatment may vary due to age or medical condition, Ondansetron, Dexamethasone and Midazolam  Airway Management Planned: Oral ETT  Additional Equipment: None  Intra-op Plan:   Post-operative Plan: Extubation in OR  Informed Consent: I have reviewed the patients History and Physical, chart, labs and discussed the procedure including the risks, benefits and alternatives for the proposed anesthesia with the patient or authorized representative who has indicated his/her understanding and acceptance.     Dental advisory given and Consent reviewed with POA  Plan Discussed with: Anesthesiologist and CRNA  Anesthesia Plan Comments: ( )        Anesthesia  Quick Evaluation

## 2021-05-15 NOTE — Progress Notes (Signed)
° °  ORTHOPAEDIC PROGRESS NOTE  s/p Procedure(s): ARTHROPLASTY BIPOLAR HIP (HEMIARTHROPLASTY) on 05/13/2021 with Dr. Zachery Dakins   SUBJECTIVE: Reports mild pain about operative site. Pain in the R shoulder as well. Denies distal n/t in hands or feet. Had some increased shortness of breath overnight including episodes of stridor, now patient reports feeling mildly improved this morning. She is on 3-4L [place] this AM.  OBJECTIVE: PE: General: resting in hospital bed, NAD RUE: swelling and bruising of the right upper extremity as to be expected, she endorses axillary nerve sensation which she states is symmetric to the contralateral side, was able to get a flicker of deltoid motor function, she endorses distal sensation, she is able to flex and extend all fingers, warm well perfused hand RLE: dressing CDI, tender to palpation right hip, leg lengths equal, intact EHL/TA/GSC, warm well perfused foot,    Vitals:   05/15/21 0142 05/15/21 0617  BP: (!) 117/53 (!) 116/59  Pulse:  84  Resp: 18 17  Temp: 98.6 F (37 C) 98 F (36.7 C)  SpO2: 96% 97%   Stable post-op images  CT scan of right shoulder demonstrates an impacted displaced right proximal humerus fracture  ASSESSMENT: [name] [name] is a 87 y.o. female  - s/p right hemiarthroplasty on 05/13/2021 with Dr. Zachery Dakins - displaced right proximal humerus fracture  PLAN: Patient underwent surgery for her right hip yesterday. She did well with this. We discussed her right proximal humerus fracture. Due to the nature of the injury, it is unlikely to heal with non-operative measures. She would have to remain nonweightbearing in a sling for a prolonged period of time and may have limited function of her right arm afterwards. She requires the use of a walker even prior to her injury and with her humerus fracture, this will prolong her ability to weight bear with a walker. We discussed operative fixation of the right proximal humerus fracture which would  be a reverse total shoulder arthroplasty. This gives her the ability to use her walker. This would get her weight bearing sooner instead of keeping her in the bed as she is unable to weight bear on her lower extremity without assistance.   The risks benefits and alternatives were discussed with the patient and her husband including but not limited to the risks of nonoperative treatment, versus surgical intervention including infection, bleeding, nerve injury, periprosthetic fracture, the need for revision surgery, blood clots, cardiopulmonary complications, morbidity, mortality, among others, and they were willing to proceed.    We additionally specifically discussed risks of axillary nerve injury, infection, periprosthetic fracture, continued pain and longevity of implants prior to beginning procedure.    Patient and her husband are in agreement with this. We will plan for surgical fixation of her right proximal humerus fracture on Friday, 2/10 with Dr. Griffin Basil.   NPO at midnight  Weightbearing:   - RLE: WBAT  - RUE: NWB in sling Insicional and dressing care: Reinforce dressings as needed Orthopedic device(s): Sling Showering: Hold for now VTE prophylaxis: Lovenox Pain control: PRN pain medications Follow - up plan: TBD Contact information:  Dr. Ophelia Charter, Noemi Chapel PA-C, After hours and holidays please check [email or web address] for group call information for [facility]  Charlies Constable, MD Orthopaedic Surgery

## 2021-05-16 ENCOUNTER — Encounter (HOSPITAL_COMMUNITY): Payer: Self-pay | Admitting: Internal Medicine

## 2021-05-16 DIAGNOSIS — S4291XA Fracture of right shoulder girdle, part unspecified, initial encounter for closed fracture: Secondary | ICD-10-CM

## 2021-05-16 DIAGNOSIS — E440 Moderate protein-calorie malnutrition: Secondary | ICD-10-CM | POA: Insufficient documentation

## 2021-05-16 NOTE — Progress Notes (Signed)
Orthopedic Tech Progress Note Patient Details:  [name] [name] [birth year] [id number]  Ortho Devices Type of Ortho Device: Sling immobilizer Ortho Device/Splint Location: right Ortho Device/Splint Interventions: Application   Post Interventions Patient Tolerated: Well Instructions Provided: Care of device  [place] Pink 05/16/2021, 11:56 AM

## 2021-05-16 NOTE — Progress Notes (Signed)
Provider notified of decreased urinary output and bladder scan results. Provider wants to give it more time. Rn will continue to monitor.

## 2021-05-16 NOTE — Progress Notes (Signed)
Physical Therapy Treatment Patient Details Name: [name] [name] MRN: [id number] DOB: March 23, 1935 Today's Date: 05/16/2021   History of Present Illness Pt s/p fall with R shoulder and hip fx and now s/p R reverse TSR and R hip hemi-arthroplasty by posterior approach.  Pt with hx of CAD, ischemic stroke and back surgery    PT Comments    Pt continues very cooperative but progressing slowly and limited by pain and non-use of R UE.  Pt would benefit from SNF level rehab to maximize IND and safety prior to dc home.   Recommendations for follow up therapy are one component of a multi-disciplinary discharge planning process, led by the attending physician.  Recommendations may be updated based on patient status, additional functional criteria and insurance authorization.  Follow Up Recommendations  Skilled nursing-short term rehab (<3 hours/day)     Assistance Recommended at Discharge Frequent or constant Supervision/Assistance  Patient can return home with the following Two people to help with walking and/or transfers;A lot of help with bathing/dressing/bathroom;Assistance with cooking/housework;Help with stairs or ramp for entrance;Assist for transportation   Equipment Recommendations  None recommended by PT    Recommendations for Other Services       Precautions / Restrictions Precautions Precautions: Posterior Hip;Fall;Shoulder Type of Shoulder Precautions: No AROM, No PROM Shoulder Interventions: Shoulder sling/immobilizer;Off for dressing/bathing/exercises Precaution Booklet Issued: Yes (comment) Precaution Comments: Okay to remove sling to weight bear with a walker Required Braces or Orthoses: Sling Restrictions Weight Bearing Restrictions: Yes RUE Weight Bearing: Non weight bearing Other Position/Activity Restrictions: Allowed limited WB for use of RW per second order     Mobility  Bed Mobility Overal bed mobility: Needs Assistance Bed Mobility: Supine to Sit     Supine to  sit: Total assist, +2 for physical assistance Sit to supine: Total assist, +2 for physical assistance, +2 for safety/equipment   General bed mobility comments: Total assist to transfer back in to supine. Patient was able to assist with partially moving hips over while in bed.    Transfers Overall transfer level: Needs assistance Equipment used: None Transfers: Sit to/from Stand, Bed to chair/wheelchair/BSC Sit to Stand: Mod assist, +2 physical assistance, From elevated surface Stand pivot transfers: Max assist, +2 physical assistance, +2 safety/equipment, From elevated surface   Squat pivot transfers: +2 physical assistance, +2 safety/equipment, Total assist     General transfer comment: Total assist to transfer from chair to edge of bed via squat pivot. Patient uses left hand to hold on to anything and unintentionally thwarts movement. Had to contain left hand in lap to safely pivot.    Ambulation/Gait               General Gait Details: pivot chair to bed only   Stairs             Wheelchair Mobility    Modified Rankin (Stroke Patients Only)       Balance Overall balance assessment: Needs assistance Sitting-balance support: Single extremity supported, Feet supported Sitting balance-Leahy Scale: Poor   Postural control: Right lateral lean Standing balance support: Single extremity supported Standing balance-Leahy Scale: Poor                              Cognition Arousal/Alertness: Awake/alert Behavior During Therapy: WFL for tasks assessed/performed Overall Cognitive Status: History of cognitive impairments - at baseline  General Comments: Spouse reports she has meemory deficits and that she says "odd things" at baseline        Exercises Total Joint Exercises Ankle Circles/Pumps: AROM, Both, 20 reps, Supine Quad Sets: AROM, Both, 10 reps, Supine Heel Slides: AAROM, Right, 20 reps,  Supine Hip ABduction/ADduction: AAROM, Right, 15 reps, Supine    General Comments        Pertinent Vitals/Pain Pain Assessment Pain Assessment: Faces Faces Pain Scale: Hurts whole lot Pain Location: R shoulder; limited c/o hip pain Pain Descriptors / Indicators: Aching, Sore, Grimacing, Guarding Pain Intervention(s): Limited activity within patient's tolerance, Monitored during session, Ice applied    Home Living Family/patient expects to be discharged to:: Skilled nursing facility Living Arrangements: Spouse/significant other Available Help at Discharge: Family Type of Home: House Home Access: Stairs to enter Entrance Stairs-Rails: None Entrance Stairs-Number of Steps: 2   Home Layout: One level Home Equipment: Conservation officer, nature (2 wheels);Shower seat      Prior Function            PT Goals (current goals can now be found in the care plan section) Acute Rehab PT Goals Patient Stated Goal: Regain IND PT Goal Formulation: With patient Time For Goal Achievement: 05/30/21 Potential to Achieve Goals: Fair Progress towards PT goals: Progressing toward goals    Frequency    Min 3X/week      PT Plan Current plan remains appropriate    Co-evaluation PT/OT/SLP Co-Evaluation/Treatment: Yes Reason for Co-Treatment: For patient/therapist safety PT goals addressed during session: Mobility/safety with mobility OT goals addressed during session:  (functional mobility)      AM-PAC PT "6 Clicks" Mobility   Outcome Measure  Help needed turning from your back to your side while in a flat bed without using bedrails?: A Lot Help needed moving from lying on your back to sitting on the side of a flat bed without using bedrails?: Total Help needed moving to and from a bed to a chair (including a wheelchair)?: Total Help needed standing up from a chair using your arms (e.g., wheelchair or bedside chair)?: Total Help needed to walk in hospital room?: Total Help needed climbing  3-5 steps with a railing? : Total 6 Click Score: 7    End of Session Equipment Utilized During Treatment: Gait belt;Other (comment) Activity Tolerance: Patient limited by fatigue;Patient limited by pain Patient left: in bed;with call bell/phone within reach;with bed alarm set;with family/visitor present Nurse Communication: Mobility status PT Visit Diagnosis: Difficulty in walking, not elsewhere classified (R26.2);Muscle weakness (generalized) (M62.81);Pain Pain - Right/Left: Right Pain - part of body: Hip;Shoulder     Time: 4709-6283 PT Time Calculation (min) (ACUTE ONLY): 27 min  Charges:  $Therapeutic Exercise: 8-22 mins                     Debe Coder PT Acute Rehabilitation Services Pager [phone] Office [phone]    [facility] 05/16/2021, 4:56 PM

## 2021-05-16 NOTE — Evaluation (Signed)
Occupational Therapy Evaluation Patient Details Name: [name] [name] MRN: [id number] DOB: [birth year] Today's Date: 05/16/2021   History of Present Illness Pt s/p fall with R shoulder and hip fx and now s/p R reverse TSR and R hip hemi-arthroplasty by posterior approach.  Pt with hx of CAD, ischemic stroke and back surgery   Clinical Impression   [name] [name] is an 87 year old woman s/p hip and shoulder surgery that presents with non functional use of right dominant upper extremity, decreased ROM and strength of RLE, impaired balance, poor activity tolerance and pain resulting in a sudden decline in functional abilities. Patient typically independent with ADLs and uses a walker in her house. Patient requiring max-total assistance x 2 for bed mobility and transfers and max-total assist for lower body dressing and toileting as well mod-max assist for UB ADLs. Patient will benefit from skilled OT services while in hospital to improve deficits and learn compensatory strategies as needed in order to return to PLOF. Recommend short term rehab at discharge.     Recommendations for follow up therapy are one component of a multi-disciplinary discharge planning process, led by the attending physician.  Recommendations may be updated based on patient status, additional functional criteria and insurance authorization.   Follow Up Recommendations  Skilled nursing-short term rehab (<3 hours/day)    Assistance Recommended at Discharge Frequent or constant Supervision/Assistance  Patient can return home with the following Two people to help with walking and/or transfers;Two people to help with bathing/dressing/bathroom;Assistance with cooking/housework;Direct supervision/assist for financial management;Help with stairs or ramp for entrance    Functional Status Assessment  Patient has had a recent decline in their functional status and demonstrates the ability to make significant improvements in function in a  reasonable and predictable amount of time.  Equipment Recommendations  BSC/3in1    Recommendations for Other Services       Precautions / Restrictions Precautions Precautions: Posterior Hip;Fall;Shoulder Type of Shoulder Precautions: No AROM, No PROM Shoulder Interventions: Shoulder sling/immobilizer;Off for dressing/bathing/exercises Precaution Booklet Issued:  (handouts) Precaution Comments: Okay to remove sling to weight bear with a walker Required Braces or Orthoses: Sling Restrictions Weight Bearing Restrictions: Yes RUE Weight Bearing: Non weight bearing Other Position/Activity Restrictions: Allowed limited WB for use of RW per second order      Mobility Bed Mobility                    Transfers                          Balance Overall balance assessment: Needs assistance   Sitting balance-Leahy Scale: Poor   Postural control: Right lateral lean Standing balance support: Single extremity supported Standing balance-Leahy Scale: Poor                             ADL either performed or assessed with clinical judgement   ADL Overall ADL's : Needs assistance/impaired Eating/Feeding: Set up;Sitting   Grooming: Set up;Sitting   Upper Body Bathing: Moderate assistance;Sitting   Lower Body Bathing: Maximal assistance;Sit to/from stand   Upper Body Dressing : Maximal assistance;Sitting   Lower Body Dressing: Total assistance;Sit to/from stand;+2 for physical assistance   Toilet Transfer: +2 for physical assistance;+2 for safety/equipment;BSC/3in1;Total assistance   Toileting- Clothing Manipulation and Hygiene: Total assistance;Sit to/from stand       Functional mobility during ADLs: Maximal assistance;+2 for safety/equipment General ADL Comments:  To pivot to chair. Patient reaching out with left hand and pushing herself towards injured side as well as automatically reaching out and grasping rails and chair and unintentionally  impeding transfers     Vision Patient Visual Report: No change from baseline       Perception     Praxis      Pertinent Vitals/Pain Pain Assessment Pain Assessment: Faces Faces Pain Scale: Hurts whole lot Pain Location: R shoulder; limited c/o hip pain Pain Descriptors / Indicators: Aching, Sore, Grimacing, Guarding Pain Intervention(s): Limited activity within patient's tolerance, Premedicated before session, Monitored during session     Hand Dominance Right   Extremity/Trunk Assessment Upper Extremity Assessment Upper Extremity Assessment: RUE deficits/detail;LUE deficits/detail RUE Deficits / Details: Impaired AROM of RUE secondary to continued effects of block - and limitations secondary to shoulder precautions RUE Sensation: decreased light touch RUE Coordination: decreased fine motor;decreased gross motor LUE Deficits / Details: WFL ROM and strength - unable to follow commands to perform all MMT LUE Sensation: WNL LUE Coordination: WNL   Lower Extremity Assessment Lower Extremity Assessment: Defer to PT evaluation   Cervical / Trunk Assessment Cervical / Trunk Assessment: Kyphotic   Communication Communication Communication: HOH   Cognition Arousal/Alertness: Awake/alert Behavior During Therapy: WFL for tasks assessed/performed Overall Cognitive Status: History of cognitive impairments - at baseline                                 General Comments: Spouse reports she has meemory deficits and that she says "odd things" at baseline     General Comments       Exercises     Shoulder Instructions      Home Living Family/patient expects to be discharged to:: Skilled nursing facility Living Arrangements: Spouse/significant other Available Help at Discharge: Family Type of Home: House Home Access: Stairs to enter Technical brewer of Steps: 2 Entrance Stairs-Rails: None Home Layout: One level     Bathroom Shower/Tub: Medical illustrator: Handicapped height     Home Equipment: Conservation officer, nature (2 wheels);Shower seat          Prior Functioning/Environment Prior Level of Function : Independent/Modified Independent             Mobility Comments: using RW for mobiltiy ADLs Comments: independent wtih ADLs        OT Problem List: Decreased strength;Decreased range of motion;Decreased activity tolerance;Impaired balance (sitting and/or standing);Decreased knowledge of use of DME or AE;Decreased knowledge of precautions;Pain;Impaired UE functional use;Decreased safety awareness;Decreased cognition      OT Treatment/Interventions: Self-care/ADL training;Therapeutic exercise;DME and/or AE instruction;Therapeutic activities;Balance training;Patient/family education    OT Goals(Current goals can be found in the care plan section) Acute Rehab OT Goals Patient Stated Goal: get better OT Goal Formulation: With patient/family Time For Goal Achievement: 05/30/21 Potential to Achieve Goals: Good  OT Frequency: Min 2X/week    Co-evaluation PT/OT/SLP Co-Evaluation/Treatment: Yes (co eval) Reason for Co-Treatment: For patient/therapist safety PT goals addressed during session: Mobility/safety with mobility OT goals addressed during session: ADL's and self-care      AM-PAC OT "6 Clicks" Daily Activity     Outcome Measure Help from another person eating meals?: A Little Help from another person taking care of personal grooming?: A Little Help from another person toileting, which includes using toliet, bedpan, or urinal?: Total Help from another person bathing (including washing, rinsing, drying)?: A Lot Help from another  person to put on and taking off regular upper body clothing?: A Lot Help from another person to put on and taking off regular lower body clothing?: Total 6 Click Score: 12   End of Session Equipment Utilized During Treatment: Gait belt Nurse Communication: Mobility status  Activity  Tolerance: Patient tolerated treatment well Patient left: in chair;with call bell/phone within reach;with family/visitor present  OT Visit Diagnosis: Other abnormalities of gait and mobility (R26.89);History of falling (Z91.81);Pain Pain - Right/Left: Right Pain - part of body: Shoulder                Time: 9030-1499 OT Time Calculation (min): 40 min Charges:  OT General Charges $OT Visit: 1 Visit OT Evaluation $OT Eval Moderate Complexity: 1 Mod  Daleen Steinhaus, OTR/L [place]  Office [phone] Pager: [phone]   Lenward Chancellor 05/16/2021, 2:38 PM

## 2021-05-16 NOTE — Evaluation (Signed)
Physical Therapy Evaluation Patient Details Name: [name] [name] MRN: [id number] DOB: 03-30-1935 Today's Date: 05/16/2021  History of Present Illness  Pt s/p fall with R shoulder and hip fx and now s/p R reverse TSR and R hip hemi-arthroplasty by posterior approach.  Pt with hx of CAD, ischemic stroke and back surgery  Clinical Impression  Pt admitted as above and presenting with functional mobility limitations 2* decreased R LE strength/ROM, non-use of R UE, post op pain, posterior THP, balance deficits and premorbid deconditioning.  Pt would greatly benefit from follow up rehab at SNF level to maximize IND and safety.     Recommendations for follow up therapy are one component of a multi-disciplinary discharge planning process, led by the attending physician.  Recommendations may be updated based on patient status, additional functional criteria and insurance authorization.  Follow Up Recommendations Skilled nursing-short term rehab (<3 hours/day)    Assistance Recommended at Discharge Frequent or constant Supervision/Assistance  Patient can return home with the following  Two people to help with walking and/or transfers;A lot of help with bathing/dressing/bathroom;Assistance with cooking/housework;Help with stairs or ramp for entrance;Assist for transportation    Equipment Recommendations None recommended by PT  Recommendations for Other Services       Functional Status Assessment Patient has had a recent decline in their functional status and demonstrates the ability to make significant improvements in function in a reasonable and predictable amount of time.     Precautions / Restrictions Precautions Precautions: Posterior Hip;Fall;Shoulder Required Braces or Orthoses: Sling Restrictions Weight Bearing Restrictions: Yes RUE Weight Bearing: Non weight bearing Other Position/Activity Restrictions: Allowed limited WB for use of RW per second order      Mobility  Bed  Mobility Overal bed mobility: Needs Assistance Bed Mobility: Supine to Sit     Supine to sit: Total assist, +2 for physical assistance     General bed mobility comments: bed pad utilized to rotate pt to EOB and assisted to sit.    Transfers Overall transfer level: Needs assistance Equipment used: Rolling walker (2 wheels), None Transfers: Sit to/from Stand, Bed to chair/wheelchair/BSC Sit to Stand: Mod assist, +2 physical assistance, From elevated surface Stand pivot transfers: Max assist, +2 physical assistance, +2 safety/equipment, From elevated surface         General transfer comment: Mod assist of 2 to stand from elevated bed side; pt unable to step with single UE only on RW; returned to sitting and stand pvt performed bed to chair    Ambulation/Gait               General Gait Details: Stood only - unable to step  Financial trader Rankin (Stroke Patients Only)       Balance Overall balance assessment: Needs assistance Sitting-balance support: Single extremity supported, Feet supported Sitting balance-Leahy Scale: Poor     Standing balance support: Single extremity supported Standing balance-Leahy Scale: Poor                               Pertinent Vitals/Pain Pain Assessment Pain Assessment: Faces Faces Pain Scale: Hurts even more Pain Location: R shoulder; limited c/o hip pain Pain Descriptors / Indicators: Aching, Sore, Grimacing, Guarding Pain Intervention(s): Limited activity within patient's tolerance, Monitored during session, Premedicated before session, Ice applied    Home Living Family/patient expects to be discharged to:: Skilled  nursing facility Living Arrangements: Spouse/significant other Available Help at Discharge: Family Type of Home: House Home Access: Stairs to enter Entrance Stairs-Rails: None Entrance Stairs-Number of Steps: 2   Home Layout: One level Home Equipment:  Conservation officer, nature (2 wheels);Shower seat      Prior Function Prior Level of Function : Independent/Modified Independent             Mobility Comments: using RW for mobiltiy       Hand Dominance   Dominant Hand: Right    Extremity/Trunk Assessment   Upper Extremity Assessment Upper Extremity Assessment: Generalized weakness;RUE deficits/detail;Defer to OT evaluation RUE Deficits / Details: Sling in place with assist of OT    Lower Extremity Assessment Lower Extremity Assessment: RLE deficits/detail    Cervical / Trunk Assessment Cervical / Trunk Assessment: Kyphotic  Communication   Communication: HOH  Cognition Arousal/Alertness: Awake/alert Behavior During Therapy: WFL for tasks assessed/performed Overall Cognitive Status: History of cognitive impairments - at baseline                                          General Comments      Exercises     Assessment/Plan    PT Assessment Patient needs continued PT services  PT Problem List Decreased strength;Decreased range of motion;Decreased activity tolerance;Decreased balance;Decreased mobility;Decreased cognition;Decreased knowledge of use of DME;Pain       PT Treatment Interventions DME instruction;Gait training;Functional mobility training;Therapeutic activities;Therapeutic exercise;Balance training;Patient/family education    PT Goals (Current goals can be found in the Care Plan section)  Acute Rehab PT Goals Patient Stated Goal: Regain IND PT Goal Formulation: With patient Time For Goal Achievement: 05/30/21 Potential to Achieve Goals: Fair    Frequency Min 3X/week     Co-evaluation PT/OT/SLP Co-Evaluation/Treatment: Yes Reason for Co-Treatment: For patient/therapist safety PT goals addressed during session: Mobility/safety with mobility OT goals addressed during session: ADL's and self-care       AM-PAC PT "6 Clicks" Mobility  Outcome Measure Help needed turning from your back  to your side while in a flat bed without using bedrails?: A Lot Help needed moving from lying on your back to sitting on the side of a flat bed without using bedrails?: Total Help needed moving to and from a bed to a chair (including a wheelchair)?: Total Help needed standing up from a chair using your arms (e.g., wheelchair or bedside chair)?: Total Help needed to walk in hospital room?: Total Help needed climbing 3-5 steps with a railing? : Total 6 Click Score: 7    End of Session Equipment Utilized During Treatment: Gait belt;Other (comment) (R arm sling) Activity Tolerance: Patient limited by fatigue;Patient limited by pain Patient left: in chair;with call bell/phone within reach;with chair alarm set Nurse Communication: Mobility status PT Visit Diagnosis: Difficulty in walking, not elsewhere classified (R26.2);Muscle weakness (generalized) (M62.81);Pain Pain - Right/Left: Right Pain - part of body: Hip;Shoulder    Time: 5009-3818 PT Time Calculation (min) (ACUTE ONLY): 40 min   Charges:   PT Evaluation $PT Eval Moderate Complexity: [place] Pager [phone] Office [phone]   Quamir Willemsen 05/16/2021, 2:00 PM

## 2021-05-16 NOTE — Progress Notes (Signed)
Progress Note    [name] [name]  MWN:[id number] DOB: September 12, 1934  DOA: 05/12/2021 PCP: Leonard Downing, MD      Brief Narrative:    Medical records reviewed and are as summarized below:  [name] [name] is a 87 y.o. female  with medical history significant of stroke, HTN, CAD s/p stent in 2008, NSCLC of LLL, s/p radiation in 2019-2020 in remission / improved as of CT chest in 2021.  She presented to the hospital after mechanical fall that occurred while she was getting out of her car at home.  She fell on her right side and sustained injury to her right hip and right shoulder.  She was found to have right proximal humerus fracture and right hip fracture.  She underwent right reverse total shoulder arthroplasty and right hip hemiarthroplasty.  She was treated with analgesics.  Orthopedic surgeon recommended DVT prophylaxis with Lovenox for 4 weeks.  She has a history of lung cancer s/p radiation therapy with subsequent remission.  However, CT chest obtained on this admission showed likely recurrence of left lower lobe lung cancer.  Outpatient follow-up with oncologist was strongly recommended.   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  S/p mechanical fall leading to closed fracture of right shoulder and closed fracture of right hip: S/p reverse total right shoulder arthroplasty on 05/15/2021, s/p right hip hemiarthroplasty on 05/13/2021.  Continue analgesics as needed for pain.  Orthopedic surgery recommended 4  weeks of Lovenox for DVT prophylaxis.  PT recommended discharge to SNF.  Discontinue IV fluids  CAD s/p coronary stent: Restart aspirin  Recurrence of left lower lobe lung cancer: S/p radiation therapy in 2019 and early 2020.  Reportedly, patient was in remission.  However, CT chest on this admission shows likely recurrence of malignancy.  Outpatient follow-up with oncologist recommended.  This was discussed with patient and her husband  Hypertension: Continue felodipine  Acute blood loss anemia: No indication for blood transfusion at this time.  Leukocytosis: This is likely reactive.  Monitor CBC  Remove Foley catheter   Diet Order             Diet regular Room service appropriate? Yes; Fluid consistency: Thin  Diet effective now                           Consultants: Orthopedic surgeon  Procedures: Right hip hemiarthroplasty, right shoulder arthroplasty    Medications:    acetaminophen  500 mg Oral Q6H   Chlorhexidine Gluconate Cloth  6 each Topical Daily   docusate sodium  100 mg Oral BID   enoxaparin (LOVENOX) injection  40 mg Subcutaneous Q24H   feeding supplement  237 mL Oral BID BM   felodipine  10 mg Oral Daily   multivitamin with minerals  1 tablet Oral Daily   rosuvastatin  20 mg Oral Daily   Continuous Infusions:     Anti-infectives (From admission, onward)    Start     Dose/Rate Route Frequency Ordered Stop   05/15/21  2000  ceFAZolin (ANCEF) IVPB 1 g/50 mL premix        1 g 100 mL/hr over 30 Minutes Intravenous Every 6 hours 05/15/21 1708 05/16/21 0941   05/15/21 1454  vancomycin (VANCOCIN) powder  Status:  Discontinued          As needed 05/15/21 1455 05/15/21 1659   05/15/21 1215  ceFAZolin (ANCEF) IVPB 2g/100 mL premix        2 g 200 mL/hr over 30 Minutes Intravenous On call to O.R. 05/15/21 1128 05/15/21 1353   05/13/21 2200  ceFAZolin (ANCEF) IVPB 2g/100 mL premix        2 g 200 mL/hr over 30 Minutes Intravenous Every 8 hours  05/13/21 1848 05/14/21 0545   05/13/21 1245  ceFAZolin (ANCEF) IVPB 2g/100 mL premix        2 g 200 mL/hr over 30 Minutes Intravenous On call to O.R. 05/13/21 1232 05/13/21 1442              Family Communication/Anticipated D/C date and plan/Code Status   DVT prophylaxis: enoxaparin (LOVENOX) injection 40 mg Start: 05/16/21 0800 SCD's Start: 05/15/21 1709 SCDs Start: 05/13/21 0051     Code Status: Full Code  Family Communication: Plan discussed with her husband at the bedside Disposition Plan: Plan to discharge to SNF   Status is: Inpatient Remains inpatient appropriate because: Awaiting placement to SNF               Subjective:   Interval events noted.  She complains of pain in the right shoulder  Objective:    Vitals:   05/16/21 0140 05/16/21 0531 05/16/21 0903 05/16/21 0904  BP: 114/81 (!) 147/70 (!) 120/108 119/79  Pulse: 90 85 94   Resp: 16 17 18    Temp: 98.6 F (37 C) 98 F (36.7 C)    TempSrc:  Oral    SpO2: 96% 99% 99%   Weight:      Height:       No data found.   Intake/Output Summary (Last 24 hours) at 05/16/2021 1145 Last data filed at 05/16/2021 0600 Gross per 24 hour  Intake 2895.37 ml  Output 2050 ml  Net 845.37 ml   Filed Weights   05/13/21 1327 05/15/21 1221  Weight: 48.1 kg 48.1 kg    Exam:  GEN: NAD SKIN: Warm and dry EYES: No pallor or icterus ENT: MMM CV: RRR PULM: CTA B ABD: soft, ND, NT, +BS CNS: AAO x 3, non focal EXT: Dressing on right shoulder and right hip surgical wounds are clean, dry and intact GU: Foley catheter draining amber urine       Data Reviewed:   I have personally reviewed following labs and imaging studies:  Labs: Labs show the following:   Basic Metabolic Panel: Recent Labs  Lab 05/12/21 2258 05/14/21 0632 05/15/21 1740  NA 137 136  --   K 3.7 4.3  --   CL 104 103  --   CO2 24 26  --   GLUCOSE 168* 129*  --   BUN 11 17  --   CREATININE 0.83 0.89 0.49  CALCIUM  8.8* 8.4*  --    GFR Estimated Creatinine Clearance: 39.7 mL/min (by C-G formula based on SCr of 0.49 mg/dL). Liver Function Tests: No results for input(s): AST, ALT, ALKPHOS, BILITOT, PROT, ALBUMIN in the last 168 hours. No results for input(s): LIPASE, AMYLASE in the last 168 hours. No results for input(s): AMMONIA in the last 168 hours. Coagulation  profile Recent Labs  Lab 05/12/21 2258  INR 1.1    CBC: Recent Labs  Lab 05/12/21 2258 05/14/21 0632 05/15/21 1740  WBC 22.4* 21.1* 17.3*  NEUTROABS 19.3*  --   --   HGB 12.6 9.8* 9.7*  HCT 38.3 30.1* 31.6*  MCV 89.7 90.1 96.0  PLT 256 174 189   Cardiac Enzymes: No results for input(s): CKTOTAL, CKMB, CKMBINDEX, TROPONINI in the last 168 hours. BNP (last 3 results) No results for input(s): PROBNP in the last 8760 hours. CBG: No results for input(s): GLUCAP in the last 168 hours. D-Dimer: No results for input(s): DDIMER in the last 72 hours. Hgb A1c: No results for input(s): HGBA1C in the last 72 hours. Lipid Profile: No results for input(s): CHOL, HDL, LDLCALC, TRIG, CHOLHDL, LDLDIRECT in the last 72 hours. Thyroid function studies: No results for input(s): TSH, T4TOTAL, T3FREE, THYROIDAB in the last 72 hours.  Invalid input(s): FREET3 Anemia work up: No results for input(s): VITAMINB12, FOLATE, FERRITIN, TIBC, IRON, RETICCTPCT in the last 72 hours. Sepsis Labs: Recent Labs  Lab 05/12/21 2258 05/14/21 0632 05/15/21 1740  WBC 22.4* 21.1* 17.3*    Microbiology Recent Results (from the past 240 hour(s))  SARS Coronavirus 2 by RT PCR (hospital order, performed in [facility] hospital lab) Nasopharyngeal Nasopharyngeal Swab     Status: None   Collection Time: 05/13/21 11:13 AM   Specimen: Nasopharyngeal Swab  Result Value Ref Range Status   SARS Coronavirus 2 NEGATIVE NEGATIVE Final    Comment: (NOTE) SARS-CoV-2 target nucleic acids are NOT DETECTED.  The SARS-CoV-2 RNA is generally detectable in upper and  lower respiratory specimens during the acute phase of infection. The lowest concentration of SARS-CoV-2 viral copies this assay can detect is 250 copies / mL. A negative result does not preclude SARS-CoV-2 infection and should not be used as the sole basis for treatment or other patient management decisions.  A negative result may occur with improper specimen collection / handling, submission of specimen other than nasopharyngeal swab, presence of viral mutation(s) within the areas targeted by this assay, and inadequate number of viral copies (<250 copies / mL). A negative result must be combined with clinical observations, patient history, and epidemiological information.  Fact Sheet for Patients:   StrictlyIdeas.no  Fact Sheet for Healthcare Providers: BankingDealers.co.za  This test is not yet approved or  cleared by the [place] FDA and has been authorized for detection and/or diagnosis of SARS-CoV-2 by FDA under an Emergency Use Authorization (EUA).  This EUA will remain in effect (meaning this test can be used) for the duration of the COVID-19 declaration under Section 564(b)(1) of the Act, 21 U.S.C. section 360bbb-3(b)(1), unless the authorization is terminated or revoked sooner.  Performed at [facility], [place] [address] [place], [place] [address]     Procedures and diagnostic studies:  DG Shoulder Right Port  Result Date: 05/15/2021 CLINICAL DATA:  Right shoulder replacement EXAM: RIGHT SHOULDER - 1 VIEW COMPARISON:  Right shoulder CT 05/13/2021 FINDINGS: Postsurgical changes of reverse right total shoulder arthroplasty. Intact hardware without evidence of loosening or periprosthetic fracture. Expected soft tissue changes. IMPRESSION: Postsurgical changes reverse right total shoulder arthroplasty. No evidence of immediate hardware complication. Electronically Signed   By: Maurine Simmering M.D.   On:  05/15/2021 16:07               LOS: 3 days   Jersee Winiarski  [facility]   Pager on [email or web address]. If 7PM-7AM, please  contact night-coverage at [email or web address]     05/16/2021, 11:45 AM

## 2021-05-16 NOTE — Progress Notes (Signed)
° °  ORTHOPAEDIC PROGRESS NOTE  s/p Procedure(s): ARTHROPLASTY BIPOLAR HIP (HEMIARTHROPLASTY) on 05/13/2021 with Dr. Zachery Dakins R reverse total shoulder 05/15/21 with Dr. Griffin Basil.   SUBJECTIVE: Reports mild pain in R shoulder and R hip. Denies distal n/t in hands or feet. No shortness of breath today. Husband at bedside. No new issues.  OBJECTIVE: PE: General: resting in hospital bed, NAD RUE: aquacell dressing c/d/I no strikethrough. swelling and bruising of the right upper extremity as to be expected, she endorses axillary nerve sensation which she states is symmetric to the contralateral side, she endorses distal sensation, she is able to flex and extend all fingers, warm well perfused hand RLE: dressing CDI, tender to palpation right hip, leg lengths equal, intact EHL/TA/GSC, warm well perfused foot,    Vitals:   05/16/21 0140 05/16/21 0531  BP: 114/81 (!) 147/70  Pulse: 90 85  Resp: 16 17  Temp: 98.6 F (37 C) 98 F (36.7 C)  SpO2: 96% 99%   Stable post-op images  CT scan of right shoulder demonstrates an impacted displaced right proximal humerus fracture  ASSESSMENT: [name] [name] is a 87 y.o. female  - s/p right hemiarthroplasty on 05/13/2021 with Dr. Zachery Dakins -s/p R reverse total shoulder 05/15/21 with Dr. Griffin Basil.   Weightbearing:   - RLE: WBAT  - RUE: patient can be out of sling for ADLs at waist level as well as using her walker, otherwise should be in the sling Insicional and dressing care: Reinforce dressings as needed, leave aquacell dressings intact. Orthopedic device(s): Sling Showering: Hold for now VTE prophylaxis: Lovenox x 4 weeks given multiextremity injuries Pain control: PRN pain medications Follow - up plan: Dr. Zachery Dakins and Dr. Griffin Basil in 1 week for post op follow up Contact information:  Dr. Ophelia Charter, Noemi Chapel PA-C, After hours and holidays please check [email or web address] for group call information for [facility]  Charlies Constable,  MD Orthopaedic Surgery

## 2021-05-16 NOTE — Progress Notes (Signed)
Occupational Therapy Treatment Patient Details Name: [name] [name] MRN: [id number] DOB: 1935-02-10 Today's Date: 05/16/2021   History of present illness Pt s/p fall with R shoulder and hip fx and now s/p R reverse TSR and R hip hemi-arthroplasty by posterior approach.  Pt with hx of CAD, ischemic stroke and back surgery   OT comments  Patient total assist (max x 2) to squat pivot back to bed. Improved posture - with less of a right lateral lean but still grasping with left hand - so much so that in order to pivot her safely her left hand to be contained in her lap. Total assist to transfer in to supine. She continues to have significant pain in right shoulder.    Recommendations for follow up therapy are one component of a multi-disciplinary discharge planning process, led by the attending physician.  Recommendations may be updated based on patient status, additional functional criteria and insurance authorization.    Follow Up Recommendations  Skilled nursing-short term rehab (<3 hours/day)    Assistance Recommended at Discharge Frequent or constant Supervision/Assistance  Patient can return home with the following  Two people to help with walking and/or transfers;Two people to help with bathing/dressing/bathroom;Assistance with cooking/housework;Direct supervision/assist for financial management;Help with stairs or ramp for entrance   Equipment Recommendations  BSC/3in1    Recommendations for Other Services      Precautions / Restrictions Precautions Precautions: Posterior Hip;Fall;Shoulder Type of Shoulder Precautions: No AROM, No PROM Shoulder Interventions: Shoulder sling/immobilizer;Off for dressing/bathing/exercises Precaution Booklet Issued:  (handouts) Precaution Comments: Okay to remove sling to weight bear with a walker Required Braces or Orthoses: Sling Restrictions Weight Bearing Restrictions: Yes RUE Weight Bearing: Non weight bearing Other Position/Activity  Restrictions: Allowed limited WB for use of RW per second order       Mobility Bed Mobility Overal bed mobility: Needs Assistance Bed Mobility: Sit to Supine       Sit to supine: Total assist, +2 for physical assistance, +2 for safety/equipment   General bed mobility comments: Total assist to transfer back in to supine. Patient was able to assist with partially moving hips over while in bed.    Transfers Overall transfer level: Needs assistance Equipment used: None       Squat pivot transfers: +2 physical assistance, +2 safety/equipment, Total assist       General transfer comment: Total assist to transfer from chair to edge of bed via squat pivot. Patient uses left hand to hold on to anything and unintentionally thwarts movement. Had to contain left hand in lap to safely pivot.     Balance Overall balance assessment: Needs assistance   Sitting balance-Leahy Scale: Poor   Postural control: Right lateral lean Standing balance support: Single extremity supported Standing balance-Leahy Scale: Poor                             ADL either performed or assessed with clinical judgement   ADL Overall ADL's : Needs assistance/impaired Eating/Feeding: Set up;Sitting   Grooming: Set up;Sitting   Upper Body Bathing: Moderate assistance;Sitting   Lower Body Bathing: Maximal assistance;Sit to/from stand   Upper Body Dressing : Maximal assistance;Sitting   Lower Body Dressing: Total assistance;Sit to/from stand;+2 for physical assistance   Toilet Transfer: +2 for physical assistance;+2 for safety/equipment;BSC/3in1;Total assistance   Toileting- Clothing Manipulation and Hygiene: Total assistance;Sit to/from stand       Functional mobility during ADLs: Maximal assistance;+2 for safety/equipment General ADL  Comments: To pivot to chair. Patient reaching out with left hand and pushing herself towards injured side as well as automatically reaching out and grasping  rails and chair and unintentionally impeding transfers    Extremity/Trunk Assessment Upper Extremity Assessment Upper Extremity Assessment: RUE deficits/detail;LUE deficits/detail RUE Deficits / Details: Impaired AROM of RUE secondary to continued effects of block - and limitations secondary to shoulder precautions RUE Sensation: decreased light touch RUE Coordination: decreased fine motor;decreased gross motor LUE Deficits / Details: WFL ROM and strength - unable to follow commands to perform all MMT LUE Sensation: WNL LUE Coordination: WNL   Lower Extremity Assessment Lower Extremity Assessment: Defer to PT evaluation   Cervical / Trunk Assessment Cervical / Trunk Assessment: Kyphotic    Vision Patient Visual Report: No change from baseline     Perception     Praxis      Cognition Arousal/Alertness: Awake/alert Behavior During Therapy: WFL for tasks assessed/performed Overall Cognitive Status: History of cognitive impairments - at baseline                                 General Comments: Spouse reports she has meemory deficits and that she says "odd things" at baseline        Exercises      Shoulder Instructions       General Comments      Pertinent Vitals/ Pain       Pain Assessment Pain Assessment: Faces Faces Pain Scale: Hurts whole lot Pain Location: R shoulder; limited c/o hip pain Pain Descriptors / Indicators: Aching, Sore, Grimacing, Guarding Pain Intervention(s): Limited activity within patient's tolerance, Monitored during session  Home Living Family/patient expects to be discharged to:: Skilled nursing facility Living Arrangements: Spouse/significant other Available Help at Discharge: Family Type of Home: House Home Access: Stairs to enter Technical brewer of Steps: 2 Entrance Stairs-Rails: None Home Layout: One level     Bathroom Shower/Tub: Occupational psychologist: Handicapped height     Home Equipment:  Conservation officer, nature (2 wheels);Shower seat          Prior Functioning/Environment              Frequency  Min 2X/week        Progress Toward Goals  OT Goals(current goals can now be found in the care plan section)  Progress towards OT goals: OT to reassess next treatment  Acute Rehab OT Goals Patient Stated Goal: get better OT Goal Formulation: With patient/family Time For Goal Achievement: 05/30/21 Potential to Achieve Goals: Good ADL Goals Pt Will Transfer to Toilet: with mod assist;bedside commode;stand pivot transfer Pt Will Perform Toileting - Clothing Manipulation and hygiene: with mod assist;sit to/from stand Additional ADL Goal #1: Patient will sit at side of bed without leaning to perform grooming task. Additional ADL Goal #2: Patient will demonstrate understanding of shoulder precautions.  Plan Discharge plan remains appropriate    Co-evaluation    PT/OT/SLP Co-Evaluation/Treatment: Yes Reason for Co-Treatment: For patient/therapist safety;To address functional/ADL transfers PT goals addressed during session: Mobility/safety with mobility OT goals addressed during session:  (functional mobility)      AM-PAC OT "6 Clicks" Daily Activity     Outcome Measure   Help from another person eating meals?: A Little Help from another person taking care of personal grooming?: A Little Help from another person toileting, which includes using toliet, bedpan, or urinal?: Total Help from another person bathing (  including washing, rinsing, drying)?: A Lot Help from another person to put on and taking off regular upper body clothing?: A Lot Help from another person to put on and taking off regular lower body clothing?: Total 6 Click Score: 12    End of Session Equipment Utilized During Treatment: Gait belt  OT Visit Diagnosis: Other abnormalities of gait and mobility (R26.89);History of falling (Z91.81);Pain Pain - Right/Left: Right Pain - part of body: Shoulder    Activity Tolerance Patient limited by pain   Patient Left in bed;with call bell/phone within reach;with family/visitor present (in care of Hunter, PT)   Nurse Communication Mobility status        Time: 2984-7308 OT Time Calculation (min): 13 min  Charges: OT General Charges $OT Visit: 1 Visit OT Evaluation $OT Eval Moderate Complexity: 1 Mod OT Treatments $Therapeutic Activity: 8-22 mins  Nikaya Nasby, OTR/L [place]  Office [phone] Pager: [place] 05/16/2021, 4:20 PM

## 2021-05-17 LAB — CBC
HCT: 29.2 % — ABNORMAL LOW (ref 36.0–46.0)
Hemoglobin: 9.5 g/dL — ABNORMAL LOW (ref 12.0–15.0)
MCH: 29.5 pg (ref 26.0–34.0)
MCHC: 32.5 g/dL (ref 30.0–36.0)
MCV: 90.7 fL (ref 80.0–100.0)
Platelets: 223 10*3/uL (ref 150–400)
RBC: 3.22 MIL/uL — ABNORMAL LOW (ref 3.87–5.11)
RDW: 13.6 % (ref 11.5–15.5)
WBC: 26.8 10*3/uL — ABNORMAL HIGH (ref 4.0–10.5)
nRBC: 0 % (ref 0.0–0.2)

## 2021-05-17 MED ORDER — TRAMADOL HCL 50 MG PO TABS
50.0000 mg | ORAL_TABLET | Freq: Four times a day (QID) | ORAL | Status: DC | PRN
  Administered 2021-05-17 – 2021-05-18 (×2): 50 mg via ORAL
  Filled 2021-05-17 (×2): qty 1

## 2021-05-17 MED ORDER — ACETAMINOPHEN 500 MG PO TABS
500.0000 mg | ORAL_TABLET | ORAL | Status: DC | PRN
  Administered 2021-05-19 (×2): 500 mg via ORAL
  Filled 2021-05-17 (×2): qty 1

## 2021-05-17 NOTE — TOC Initial Note (Signed)
Transition of Care [facility]) - Initial/Assessment Note    Patient Details  Name: [name] [name] MRN: [id number] Date of Birth: Mar 26, 1935  [facility]) CM/SW Contact:    Ross Ludwig, LCSW Phone Number: 05/17/2021, 5:40 PM  Clinical Narrative:                  Patient is an 87 year old female who is alert and oriented x2.  Patient lives with her husband, PT worked with patient and recommends short term rehab.  CSW to begin bed search in [facility].  Patient will have to be approved by insurance prior to discharge.  CSW to continue to follow patient's progress throughout discharge planning.  Expected Discharge Plan: Skilled Nursing Facility Barriers to Discharge: Continued Medical Work up   Patient Goals and CMS Choice Patient states their goals for this hospitalization and ongoing recovery are:: To go to SNF then return back home.      Expected Discharge Plan and Services Expected Discharge Plan: [place] In-house Referral: Clinical Social Work   Post Acute Care Choice: [place] Living arrangements for the past 2 months: [place]                                      Prior Living Arrangements/Services Living arrangements for the past 2 months: Single Family Home Lives with:: Spouse Patient language and need for interpreter reviewed:: Yes Do you feel safe going back to the place where you live?: No   Patient feels she needs some rehab before returning back home.  Need for Family Participation in Patient Care: Yes (Comment) Care giver support system in place?: No (comment)   Criminal Activity/Legal Involvement Pertinent to Current Situation/Hospitalization: No - Comment as needed  Activities of Daily Living Home Assistive Devices/Equipment: Walker (specify type), Wheelchair ADL Screening (condition at time of admission) Patient's cognitive ability adequate to safely complete daily activities?: Yes Is the  patient deaf or have difficulty hearing?: Yes Does the patient have difficulty seeing, even when wearing glasses/contacts?: Yes Does the patient have difficulty concentrating, remembering, or making decisions?: Yes Patient able to express need for assistance with ADLs?: No Does the patient have difficulty dressing or bathing?: Yes Independently performs ADLs?: Yes (appropriate for developmental age) Does the patient have difficulty walking or climbing stairs?: Yes Weakness of Legs: Both Weakness of Arms/Hands: Right  Permission Sought/Granted Permission sought to share information with : Case Manager, Family Supports, Customer service manager Permission granted to share information with : Yes, Release of Information Signed  Share Information with NAME: Crounse,Curtis Spouse [phone]  [phone]  Ruby Daughter   [phone]  Fredrich Romans [phone]  Permission granted to share info w AGENCY: SNF admissions        Emotional Assessment Appearance:: Appears stated age Attitude/Demeanor/Rapport: Engaged Affect (typically observed): Accepting, Appropriate Orientation: : Oriented to Self, Oriented to Place Alcohol / Substance Use: Not Applicable Psych Involvement: No (comment)  Admission diagnosis:  Fall [W19.XXXA] Closed fracture of neck of right femur, initial encounter ([place]) [S72.001A] Closed displaced fracture of right femoral neck ([place]) [S72.001A] Closed fracture of proximal end of right humerus, unspecified fracture morphology, initial encounter [S42.201A] Patient Active Problem List   Diagnosis Date Noted   Malnutrition of moderate degree 05/16/2021   Closed displaced fracture of right femoral neck ([place]) 05/13/2021   Closed fracture of right shoulder 05/13/2021   Malignant  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 ([place]) 10/26/2017   S/P bronchoscopy 08/03/2017   Pneumothorax after biopsy 08/03/2017   NSVT (nonsustained ventricular tachycardia)     Acute embolic stroke ([place]) 78/47/8412   Hypokalemia 06/18/2017   Ischemic stroke ([place]) 06/18/2017   Dehydration 05/08/2017   Nausea & vomiting 05/08/2017   Leukocytosis    CAP (community acquired pneumonia) 08/23/2014   Leg pain 09/15/2011   Carotid bruit 09/15/2011   Mass of lower lobe of left lung 08/16/2011   HYPERCHOLESTEROLEMIA 01/07/2010   ANXIETY 01/07/2010   Essential hypertension 01/07/2010   Coronary atherosclerosis 01/07/2010   PCP:  Leonard Downing, MD Pharmacy:   [facility], [address] [place] [address] Phone: [phone] Fax: [phone]     Social Determinants of Health (SDOH) Interventions    Readmission Risk Interventions No flowsheet data found.

## 2021-05-17 NOTE — NC FL2 (Signed)
[place] LEVEL OF CARE SCREENING TOOL     IDENTIFICATION  Patient Name: [name] [name] Birthdate: 04/23/1934 Sex: female Admission Date (Current Location): 05/12/2021  [facility] and [place] Number:  Herbalist and Address:  [facility],  [place], [place]      Provider Number: [id number]  Attending Physician Name and Address:  Jennye Boroughs, MD  Relative Name and Phone Number:  Hostler,Curtis Spouse [phone]  [phone]  [place] Daughter   [phone]  Fredrich Romans [phone]    Current Level of Care: Hospital Recommended Level of Care: [place] Prior Approval Number:    Date Approved/Denied:   PASRR Number: [id number]  Discharge Plan: SNF    Current Diagnoses: Patient Active Problem List   Diagnosis Date Noted   Malnutrition of moderate degree 05/16/2021   Closed displaced fracture of right femoral neck ([place]) 05/13/2021   Closed fracture of right shoulder 05/13/2021   Malignant 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 ([place]) 10/26/2017   S/P bronchoscopy 08/03/2017   Pneumothorax after biopsy 08/03/2017   NSVT (nonsustained ventricular tachycardia)    Acute embolic stroke ([place]) 44/81/8563   Hypokalemia 06/18/2017   Ischemic stroke ([place]) 06/18/2017   Dehydration 05/08/2017   Nausea & vomiting 05/08/2017   Leukocytosis    CAP (community acquired pneumonia) 08/23/2014   Leg pain 09/15/2011   Carotid bruit 09/15/2011   Mass of lower lobe of left lung 08/16/2011   HYPERCHOLESTEROLEMIA 01/07/2010   ANXIETY 01/07/2010   Essential hypertension 01/07/2010   Coronary atherosclerosis 01/07/2010    Orientation RESPIRATION BLADDER Height & Weight     Self, Place  O2 (2L) Incontinent Weight: 106 lb 0.7 oz (48.1 kg) Height:  5\' 2"  (157.5 cm)  BEHAVIORAL SYMPTOMS/[place] NEUROLOGICAL BOWEL NUTRITION STATUS      Continent Diet (Regular diet)  AMBULATORY STATUS COMMUNICATION OF NEEDS Skin    Limited Assist Verbally Surgical wounds                       Personal Care Assistance Level of Assistance  Bathing, Dressing, Feeding Bathing Assistance: Limited assistance Feeding assistance: Independent Dressing Assistance: Limited assistance     Functional Limitations Info  Sight, Hearing, Speech Sight Info: Adequate Hearing Info: Adequate Speech Info: Adequate    SPECIAL CARE FACTORS FREQUENCY  PT (By licensed PT), OT (By licensed OT)     PT Frequency: Minimum 5x a week OT Frequency: Minimum 5x a week            Contractures Contractures Info: Not present    Additional Factors Info  Code Status, Allergies Code Status Info: Full Code             Current Medications (05/17/2021):  This is the current hospital active medication list Current Facility-Administered Medications  Medication Dose Route Frequency Provider Last Rate Last Admin   acetaminophen (TYLENOL) tablet 500 mg  500 mg Oral Q4H PRN Gawne, Meghan M, PA-C       albuterol (PROVENTIL) (2.5 MG/3ML) 0.083% nebulizer solution 2.5 mg  2.5 mg Nebulization Q4H PRN Ethelda Chick, PA-C   2.5 mg at 05/15/21 1497   bisacodyl (DULCOLAX) suppository 10 mg  10 mg Rectal Daily PRN McBane, Maylene Roes, PA-C       Chlorhexidine Gluconate Cloth 2 % PADS 6 each  6 each Topical Daily Ethelda Chick, PA-C   6 each at 05/15/21 1052   diphenhydrAMINE (BENADRYL) 12.5 MG/5ML elixir 12.5-25 mg  12.5-25 mg Oral Q4H PRN McBane, Maylene Roes, PA-C       docusate sodium (COLACE) capsule 100 mg  100 mg Oral BID Ethelda Chick, PA-C   100 mg at 05/17/21 0858   enoxaparin (LOVENOX) injection 40 mg  40 mg Subcutaneous Q24H Ethelda Chick, PA-C   40 mg at 05/17/21 0859   feeding supplement (ENSURE ENLIVE / ENSURE PLUS) liquid 237 mL  237 mL Oral BID BM Ethelda Chick, PA-C   237 mL at 05/17/21 0859   felodipine (PLENDIL) 24 hr tablet 10 mg  10 mg Oral Daily Ethelda Chick, PA-C   10 mg at 05/17/21 6578    HYDROcodone-acetaminophen (NORCO/VICODIN) 5-325 MG per tablet 1 tablet  1 tablet Oral Q6H PRN Ethelda Chick, PA-C   1 tablet at 05/17/21 1211   menthol-cetylpyridinium (CEPACOL) lozenge 3 mg  1 lozenge Oral PRN McBane, Maylene Roes, PA-C       Or   phenol (CHLORASEPTIC) mouth spray 1 spray  1 spray Mouth/Throat PRN McBane, Maylene Roes, PA-C       morphine (PF) 2 MG/ML injection 2-4 mg  2-4 mg Intravenous Q2H PRN Ethelda Chick, PA-C   2 mg at 05/16/21 2300   multivitamin with minerals tablet 1 tablet  1 tablet Oral Daily Ethelda Chick, PA-C   1 tablet at 05/17/21 0858   ondansetron (ZOFRAN) tablet 4 mg  4 mg Oral Q6H PRN Ethelda Chick, PA-C       Or   ondansetron (ZOFRAN) injection 4 mg  4 mg Intravenous Q6H PRN Ethelda Chick, PA-C   4 mg at 05/16/21 1726   polyethylene glycol (MIRALAX / GLYCOLAX) packet 17 g  17 g Oral Daily PRN McBane, Maylene Roes, PA-C       rosuvastatin (CRESTOR) tablet 20 mg  20 mg Oral Daily McBane, Maylene Roes, PA-C   20 mg at 05/17/21 0900   traMADol (ULTRAM) tablet 50 mg  50 mg Oral Q6H PRN Aggie Moats M, PA-C   50 mg at 05/17/21 1744     Discharge Medications: Please see discharge summary for a list of discharge medications.  Relevant Imaging Results:  Relevant Lab Results:   Additional Information SSN [id number]  Ross Ludwig, LCSW

## 2021-05-17 NOTE — Progress Notes (Addendum)
Subjective: Patient reports pain as mild. Worse with movement. Tolerating diet. Urinating. No CP. Family at bedside who report patient is more lucid today and more like herself. Working with PT on mobilizing OOB while maintaining WB status.  Objective:   VITALS:   Vitals:   05/16/21 0904 05/16/21 1400 05/16/21 2237 05/17/21 0611  BP: 119/79 (!) 103/47 139/62 (!) 127/59  Pulse:  (!) 103 (!) 110 96  Resp:  16 17 16   Temp:  (!) 97.5 F (36.4 C) 99.9 F (37.7 C) 98.7 F (37.1 C)  TempSrc:  Oral  Oral  SpO2:  95% 97% 98%  Weight:      Height:       CBC Latest Ref Rng & Units 05/17/2021 05/15/2021 05/14/2021  WBC 4.0 - 10.5 K/uL 26.8(H) 17.3(H) 21.1(H)  Hemoglobin 12.0 - 15.0 g/dL 9.5(L) 9.7(L) 9.8(L)  Hematocrit 36.0 - 46.0 % 29.2(L) 31.6(L) 30.1(L)  Platelets 150 - 400 K/uL 223 189 174   BMP Latest Ref Rng & Units 05/15/2021 05/14/2021 05/12/2021  Glucose 70 - 99 mg/dL - 129(H) 168(H)  BUN 8 - 23 mg/dL - 17 11  Creatinine 0.44 - 1.00 mg/dL 0.49 0.89 0.83  Sodium 135 - 145 mmol/L - 136 137  Potassium 3.5 - 5.1 mmol/L - 4.3 3.7  Chloride 98 - 111 mmol/L - 103 104  CO2 22 - 32 mmol/L - 26 24  Calcium 8.9 - 10.3 mg/dL - 8.4(L) 8.8(L)   Intake/Output      02/11 0701 02/12 0700 02/12 0701 02/13 0700   P.O. 910 120   I.V. (mL/kg) 488 (10.1)    IV Piggyback     Total Intake(mL/kg) 1398 (29.1) 120 (2.5)   Urine (mL/kg/hr) 1800 (1.6) 400 (2.1)   Stool 0 0   Blood     Total Output 1800 400   Net -402 -280        Urine Occurrence 1 x 1 x   Stool Occurrence 0 x 0 x      Physical Exam: General: NAD. Sleeping in bed, easily awoken, calm. Raspy quiet voice Resp: increased wob, some wheezing, on 1L O2 via [place] Cardio: regular rate and rhythm ABD soft Neurologically intact MSK Neurovascularly intact Sensation intact distally Intact pulses distally Dorsiflexion/Plantar flexion intact Flexion/extension intact at wrist and hand Some edema to right hand and fingers Extensive  ecchymosis to RUE Incisions: dressings C/D/I Wound to left elbow weeping serosanginous drainage through bandage, sling, and onto pillow propping arm up. Removed bandage as it was fully saturated. RN notified and patient cleaned up and given new larger bandage and new pillow    Assessment:  4 Days Post-Op  S/P Right hip hemiarthroplasty By Dr. Zachery Dakins on 05/13/21 2 Days Post-Op  S/P Procedure(s) (LRB): REVERSE SHOULDER ARTHROPLASTY (Right) by Dr. Griffin Basil on 05/15/21  Principal Problem:   Closed displaced fracture of right femoral neck ([place]) Active Problems:   Essential hypertension   Coronary atherosclerosis   Malignant 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 (H[facility])   Closed fracture of right shoulder   Malnutrition of moderate degree   Plan: Monitor labs, WBCs elevated, H/H stable  Advance diet Up with therapy Incentive Spirometry Elevate and Apply ice  Weightbearing: WBAT RLE, RUE NWB patient can be out of sling for ADLs at waist level as well as using her walker, otherwise should be in the sling Insicional and dressing care: Dressings left intact until follow-up and Reinforce dressings as needed Orthopedic device(s):  sling Showering: Keep  dressing dry VTE prophylaxis: Lovenox 40mg  qd  x 30 days , SCDs, ambulation Pain control: only has Norco and Morphine PRN, will add Tylenol and Tramadol to decrease heavy narcotic use Follow - up plan: 1 week with Dr. Zachery Dakins and Dr. Griffin Basil  Dispo:  PT/OT recommending SNF.      Britt Bottom, PA-C Office [phone] 05/17/2021, 11:02 AM

## 2021-05-17 NOTE — Progress Notes (Addendum)
Progress Note    [name] [name]  XBL:[id number] DOB: [birth year]  DOA: 05/12/2021 PCP: Leonard Downing, MD      Brief Narrative:    Medical records reviewed and are as summarized below:  [name] [name] is a 87 y.o. female  with medical history significant of stroke, HTN, CAD s/p stent in 2008, NSCLC of LLL, s/p radiation in 2019-2020 in remission / improved as of CT chest in 2021.  She presented to the hospital after mechanical fall that occurred while she was getting out of her car at home.  She fell on her right side and sustained injury to her right hip and right shoulder.  She was found to have right proximal humerus fracture and right hip fracture.  She underwent right reverse total shoulder arthroplasty and right hip hemiarthroplasty.  She was treated with analgesics.  Orthopedic surgeon recommended DVT prophylaxis with Lovenox for 4 weeks.  She has a history of lung cancer s/p radiation therapy with subsequent remission.  However, CT chest obtained on this admission showed likely recurrence of left lower lobe lung cancer.  Outpatient follow-up with oncologist was strongly recommended.   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  S/p mechanical fall leading to closed fracture of right shoulder and closed fracture of right hip: S/p reverse total right shoulder arthroplasty on 05/15/2021, s/p right hip hemiarthroplasty on 05/13/2021.  Continue analgesics as needed for pain.  Continue PT and OT.  Orthopedic  surgeon recommended 4 weeks of Lovenox for DVT prophylaxis.  CAD s/p coronary stent: Continue aspirin  Recurrence of left lower lobe lung cancer: S/p radiation therapy in 2019 and early 2020.  Reportedly, patient was in remission.  However, CT chest on this admission shows likely recurrence of malignancy.  Outpatient follow-up with oncologist recommended.   This was discussed with Pamala Hurry, daughter, at the bedside  Hypertension: Continue felodipine  Acute blood loss anemia: No indication for blood transfusion at this time.  Leukocytosis: Worsening leukocytosis.  Continue to monitor.  This is likely reactive.      Diet Order             Diet regular Room service appropriate? Yes; Fluid consistency: Thin  Diet effective now                           Consultants: Orthopedic surgeon  Procedures: Right hip hemiarthroplasty, right shoulder arthroplasty    Medications:    Chlorhexidine Gluconate Cloth  6 each Topical Daily   docusate sodium  100 mg Oral BID   enoxaparin (LOVENOX) injection  40 mg Subcutaneous Q24H   feeding supplement  237 mL Oral BID BM   felodipine  10 mg Oral Daily   multivitamin with minerals  1 tablet Oral Daily   rosuvastatin  20 mg Oral Daily   Continuous Infusions:     Anti-infectives (From admission, onward)    Start     Dose/Rate Route Frequency Ordered Stop   05/15/21 2000  ceFAZolin (ANCEF) IVPB 1 g/50 mL  premix        1 g 100 mL/hr over 30 Minutes Intravenous Every 6 hours 05/15/21 1708 05/16/21 1100   05/15/21 1454  vancomycin (VANCOCIN) powder  Status:  Discontinued          As needed 05/15/21 1455 05/15/21 1659   05/15/21 1215  ceFAZolin (ANCEF) IVPB 2g/100 mL premix        2 g 200 mL/hr over 30 Minutes Intravenous On call to O.R. 05/15/21 1128 05/15/21 1353   05/13/21 2200  ceFAZolin (ANCEF) IVPB 2g/100 mL premix        2 g 200 mL/hr over 30 Minutes Intravenous Every 8 hours 05/13/21 1848 05/14/21 0545   05/13/21 1245   ceFAZolin (ANCEF) IVPB 2g/100 mL premix        2 g 200 mL/hr over 30 Minutes Intravenous On call to O.R. 05/13/21 1232 05/13/21 1442              Family Communication/Anticipated D/C date and plan/Code Status   DVT prophylaxis: enoxaparin (LOVENOX) injection 40 mg Start: 05/16/21 0800 SCD's Start: 05/15/21 1709 SCDs Start: 05/13/21 0051     Code Status: Full Code  Family Communication: Plan discussed with Pamala Hurry, daughter, at the bedside Disposition Plan: Plan to discharge to SNF   Status is: Inpatient Remains inpatient appropriate because: Awaiting placement to SNF               Subjective:   Interval events noted.  No pain in the right shoulder or right hip.  No cough, shortness of breath or chest pain  Objective:    Vitals:   05/16/21 0904 05/16/21 1400 05/16/21 2237 05/17/21 0611  BP: 119/79 (!) 103/47 139/62 (!) 127/59  Pulse:  (!) 103 (!) 110 96  Resp:  16 17 16   Temp:  (!) 97.5 F (36.4 C) 99.9 F (37.7 C) 98.7 F (37.1 C)  TempSrc:  Oral  Oral  SpO2:  95% 97% 98%  Weight:      Height:       No data found.   Intake/Output Summary (Last 24 hours) at 05/17/2021 1125 Last data filed at 05/17/2021 1023 Gross per 24 hour  Intake 1268 ml  Output 700 ml  Net 568 ml   Filed Weights   05/13/21 1327 05/15/21 1221  Weight: 48.1 kg 48.1 kg    Exam:  GEN: NAD SKIN: Warm and dry.  Ecchymoses on the right upper extremity EYES: No pallor or icterus ENT: MMM CV: RRR PULM: CTA B ABD: soft, ND, NT, +BS CNS: AAO x 3, non focal EXT: Dressing on right shoulder and right hip surgical wounds  are intact.  Sling on right arm in place     Data Reviewed:   I have personally reviewed following labs and imaging studies:  Labs: Labs show the following:   Basic Metabolic Panel: Recent Labs  Lab 05/12/21 2258 05/14/21 0632 05/15/21 1740  NA 137 136  --   K 3.7 4.3  --   CL 104 103  --   CO2 24 26  --   GLUCOSE 168* 129*  --   BUN 11  17  --   CREATININE 0.83 0.89 0.49  CALCIUM 8.8* 8.4*  --    GFR Estimated Creatinine Clearance: 39.7 mL/min (by C-G formula based on SCr of 0.49 mg/dL). Liver Function Tests: No results for input(s): AST, ALT, ALKPHOS, BILITOT, PROT, ALBUMIN in the last 168 hours. No results for input(s): LIPASE, AMYLASE in the last 168  hours. No results for input(s): AMMONIA in the last 168 hours. Coagulation profile Recent Labs  Lab 05/12/21 2258  INR 1.1    CBC: Recent Labs  Lab 05/12/21 2258 05/14/21 0632 05/15/21 1740 05/17/21 0334  WBC 22.4* 21.1* 17.3* 26.8*  NEUTROABS 19.3*  --   --   --   HGB 12.6 9.8* 9.7* 9.5*  HCT 38.3 30.1* 31.6* 29.2*  MCV 89.7 90.1 96.0 90.7  PLT 256 174 189 223   Cardiac Enzymes: No results for input(s): CKTOTAL, CKMB, CKMBINDEX, TROPONINI in the last 168 hours. BNP (last 3 results) No results for input(s): PROBNP in the last 8760 hours. CBG: No results for input(s): GLUCAP in the last 168 hours. D-Dimer: No results for input(s): DDIMER in the last 72 hours. Hgb A1c: No results for input(s): HGBA1C in the last 72 hours. Lipid Profile: No results for input(s): CHOL, HDL, LDLCALC, TRIG, CHOLHDL, LDLDIRECT in the last 72 hours. Thyroid function studies: No results for input(s): TSH, T4TOTAL, T3FREE, THYROIDAB in the last 72 hours.  Invalid input(s): FREET3 Anemia work up: No results for input(s): VITAMINB12, FOLATE, FERRITIN, TIBC, IRON, RETICCTPCT in the last 72 hours. Sepsis Labs: Recent Labs  Lab 05/12/21 2258 05/14/21 0632 05/15/21 1740 05/17/21 0334  WBC 22.4* 21.1* 17.3* 26.8*    Microbiology Recent Results (from the past 240 hour(s))  SARS Coronavirus 2 by RT PCR (hospital order, performed in [facility] hospital lab) Nasopharyngeal Nasopharyngeal Swab     Status: None   Collection Time: 05/13/21 11:13 AM   Specimen: Nasopharyngeal Swab  Result Value Ref Range Status   SARS Coronavirus 2 NEGATIVE NEGATIVE Final    Comment:  (NOTE) SARS-CoV-2 target nucleic acids are NOT DETECTED.  The SARS-CoV-2 RNA is generally detectable in upper and lower respiratory specimens during the acute phase of infection. The lowest concentration of SARS-CoV-2 viral copies this assay can detect is 250 copies / mL. A negative result does not preclude SARS-CoV-2 infection and should not be used as the sole basis for treatment or other patient management decisions.  A negative result may occur with improper specimen collection / handling, submission of specimen other than nasopharyngeal swab, presence of viral mutation(s) within the areas targeted by this assay, and inadequate number of viral copies (<250 copies / mL). A negative result must be combined with clinical observations, patient history, and epidemiological information.  Fact Sheet for Patients:   StrictlyIdeas.no  Fact Sheet for Healthcare Providers: BankingDealers.co.za  This test is not yet approved or  cleared by the [place] FDA and has been authorized for detection and/or diagnosis of SARS-CoV-2 by FDA under an Emergency Use Authorization (EUA).  This EUA will remain in effect (meaning this test can be used) for the duration of the COVID-19 declaration under Section 564(b)(1) of the Act, 21 U.S.C. section 360bbb-3(b)(1), unless the authorization is terminated or revoked sooner.  Performed at [facility], [place] [address] [place], [place] [address]     Procedures and diagnostic studies:  DG Shoulder Right Port  Result Date: 05/15/2021 CLINICAL DATA:  Right shoulder replacement EXAM: RIGHT SHOULDER - 1 VIEW COMPARISON:  Right shoulder CT 05/13/2021 FINDINGS: Postsurgical changes of reverse right total shoulder arthroplasty. Intact hardware without evidence of loosening or periprosthetic fracture. Expected soft tissue changes. IMPRESSION: Postsurgical changes reverse right total shoulder  arthroplasty. No evidence of immediate hardware complication. Electronically Signed   By: Maurine Simmering M.D.   On: 05/15/2021 16:07  LOS: 4 days   Zimir Kittleson  [facility]   Pager on [email or web address]. If 7PM-7AM, please contact night-coverage at [email or web address]     05/17/2021, 11:25 AM

## 2021-05-17 NOTE — Plan of Care (Signed)
  Problem: Clinical Measurements: Goal: Ability to maintain clinical measurements within normal limits will improve Outcome: Progressing   Problem: Activity: Goal: Risk for activity intolerance will decrease Outcome: Progressing   

## 2021-05-17 NOTE — Plan of Care (Signed)
  Problem: Coping: Goal: Level of anxiety will decrease Outcome: Progressing   Problem: Pain Managment: Goal: General experience of comfort will improve Outcome: Progressing   Problem: Safety: Goal: Ability to remain free from injury will improve Outcome: Progressing   

## 2021-05-18 ENCOUNTER — Telehealth: Payer: Self-pay | Admitting: Radiation Oncology

## 2021-05-18 DIAGNOSIS — S72001A Fracture of unspecified part of neck of right femur, initial encounter for closed fracture: Secondary | ICD-10-CM | POA: Diagnosis not present

## 2021-05-18 LAB — RESP PANEL BY RT-PCR (FLU A&B, COVID) ARPGX2
Influenza A by PCR: NEGATIVE
Influenza B by PCR: NEGATIVE
SARS Coronavirus 2 by RT PCR: NEGATIVE

## 2021-05-18 LAB — CBC WITH DIFFERENTIAL/PLATELET
Abs Immature Granulocytes: 0.14 10*3/uL — ABNORMAL HIGH (ref 0.00–0.07)
Basophils Absolute: 0 10*3/uL (ref 0.0–0.1)
Basophils Relative: 0 %
Eosinophils Absolute: 0.4 10*3/uL (ref 0.0–0.5)
Eosinophils Relative: 2 %
HCT: 27.7 % — ABNORMAL LOW (ref 36.0–46.0)
Hemoglobin: 8.6 g/dL — ABNORMAL LOW (ref 12.0–15.0)
Immature Granulocytes: 1 %
Lymphocytes Relative: 14 %
Lymphs Abs: 2.2 10*3/uL (ref 0.7–4.0)
MCH: 29.3 pg (ref 26.0–34.0)
MCHC: 31 g/dL (ref 30.0–36.0)
MCV: 94.2 fL (ref 80.0–100.0)
Monocytes Absolute: 1.9 10*3/uL — ABNORMAL HIGH (ref 0.1–1.0)
Monocytes Relative: 12 %
Neutro Abs: 11.6 10*3/uL — ABNORMAL HIGH (ref 1.7–7.7)
Neutrophils Relative %: 71 %
Platelets: 228 10*3/uL (ref 150–400)
RBC: 2.94 MIL/uL — ABNORMAL LOW (ref 3.87–5.11)
RDW: 13.8 % (ref 11.5–15.5)
WBC: 16.3 10*3/uL — ABNORMAL HIGH (ref 4.0–10.5)
nRBC: 0 % (ref 0.0–0.2)

## 2021-05-18 NOTE — Progress Notes (Signed)
Subjective: Patient reports pain as mild. Working with PT on sitting up at side of bed. Has not been able to stand up yet. Waiting on discussion with social work regarding rehab placement. No new issues.  Objective:   VITALS:   Vitals:   05/17/21 1429 05/17/21 2056 05/18/21 0500 05/18/21 0906  BP: (!) 114/51 110/76 109/68   Pulse: (!) 102 (!) 103 92 96  Resp: 16 15 14 15   Temp: 98.1 F (36.7 C) 97.8 F (36.6 C) 98.7 F (37.1 C) 98.7 F (37.1 C)  TempSrc: Oral Oral  Oral  SpO2: 94% 92% 95% 91%  Weight:      Height:       CBC Latest Ref Rng & Units 05/18/2021 05/17/2021 05/15/2021  WBC 4.0 - 10.5 K/uL 16.3(H) 26.8(H) 17.3(H)  Hemoglobin 12.0 - 15.0 g/dL 8.6(L) 9.5(L) 9.7(L)  Hematocrit 36.0 - 46.0 % 27.7(L) 29.2(L) 31.6(L)  Platelets 150 - 400 K/uL 228 223 189   BMP Latest Ref Rng & Units 05/15/2021 05/14/2021 05/12/2021  Glucose 70 - 99 mg/dL - 129(H) 168(H)  BUN 8 - 23 mg/dL - 17 11  Creatinine 0.44 - 1.00 mg/dL 0.49 0.89 0.83  Sodium 135 - 145 mmol/L - 136 137  Potassium 3.5 - 5.1 mmol/L - 4.3 3.7  Chloride 98 - 111 mmol/L - 103 104  CO2 22 - 32 mmol/L - 26 24  Calcium 8.9 - 10.3 mg/dL - 8.4(L) 8.8(L)   Intake/Output      02/12 0701 02/13 0700 02/13 0701 02/14 0700   P.O. 480    I.V. (mL/kg)     Total Intake(mL/kg) 480 (10)    Urine (mL/kg/hr) 1150 (1)    Stool 0    Total Output 1150    Net -670         Urine Occurrence 2 x    Stool Occurrence 0 x       Physical Exam: General: NAD. Sleeping in bed, easily awoken, calm. Raspy quiet voice Resp: increased wob, some wheezing, on 1L O2 via [place] Cardio: regular rate and rhythm ABD soft Neurologically intact MSK Neurovascularly intact Sensation intact distally Intact pulses distally Dorsiflexion/Plantar flexion intact Flexion/extension intact at wrist and hand Some edema to right hand and fingers Extensive ecchymosis to RUE Incisions: dressings C/D/I Wound to left elbow weeping serosanginous drainage through  bandage, sling, and onto pillow propping arm up. Removed bandage as it was fully saturated. RN notified and patient cleaned up and given new larger bandage and new pillow    Assessment:  4 Days Post-Op  S/P Right hip hemiarthroplasty By Dr. Zachery Dakins on 05/13/21 3 Days Post-Op  S/P Procedure(s) (LRB): REVERSE SHOULDER ARTHROPLASTY (Right) by Dr. 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)   Closed fracture of right shoulder   Malnutrition of moderate degree   Plan: Monitor labs, WBCs elevated, H/H stable  Advance diet Up with therapy Incentive Spirometry Elevate and Apply ice  Weightbearing: WBAT RLE, RUE NWB patient can be out of sling for ADLs at waist level as well as using her walker, otherwise should be in the sling Insicional and dressing care: Dressings left intact until follow-up and Reinforce dressings as needed Orthopedic device(s):  sling Showering: Keep dressing dry VTE prophylaxis: Lovenox 40mg  qd  x 30 days , SCDs, ambulation Pain control: only  has Norco and Morphine PRN, will add Tylenol and Tramadol to decrease heavy narcotic use Follow - up plan: 1 week with Dr. Zachery Dakins and Dr. Griffin Basil  Dispo:  PT/OT recommending SNF.      Willaim Sheng, PA-C Office [phone] 05/18/2021, 12:16 PM

## 2021-05-18 NOTE — Progress Notes (Signed)
Progress Note  Patient Name: [name] [name] Date of Encounter: 05/18/2021  Primary Cardiologist:   Jenkins Rouge, MD   Subjective   No chest pain or SOB post op.    Inpatient Medications    Scheduled Meds:  Chlorhexidine Gluconate Cloth  6 each Topical Daily   docusate sodium  100 mg Oral BID   enoxaparin (LOVENOX) injection  40 mg Subcutaneous Q24H   feeding supplement  237 mL Oral BID BM   felodipine  10 mg Oral Daily   multivitamin with minerals  1 tablet Oral Daily   rosuvastatin  20 mg Oral Daily   Continuous Infusions:  PRN Meds: acetaminophen, albuterol, bisacodyl, diphenhydrAMINE, HYDROcodone-acetaminophen, menthol-cetylpyridinium **OR** phenol, morphine injection, ondansetron **OR** ondansetron (ZOFRAN) IV, polyethylene glycol, traMADol   Vital Signs    Vitals:   05/17/21 1429 05/17/21 2056 05/18/21 0500 05/18/21 0906  BP: (!) 114/51 110/76 109/68   Pulse: (!) 102 (!) 103 92 96  Resp: 16 15 14 15   Temp: 98.1 F (36.7 C) 97.8 F (36.6 C) 98.7 F (37.1 C) 98.7 F (37.1 C)  TempSrc: Oral Oral  Oral  SpO2: 94% 92% 95% 91%  Weight:      Height:        Intake/Output Summary (Last 24 hours) at 05/18/2021 0952 Last data filed at 05/18/2021 0600 Gross per 24 hour  Intake 480 ml  Output 1150 ml  Net -670 ml   Filed Weights   05/13/21 1327 05/15/21 1221  Weight: 48.1 kg 48.1 kg    Telemetry    NA - Personally Reviewed  ECG    NA - Personally Reviewed  Physical Exam   GEN: No acute distress.    Labs    Chemistry Recent Labs  Lab 05/12/21 2258 05/14/21 0632 05/15/21 1740  NA 137 136  --   K 3.7 4.3  --   CL 104 103  --   CO2 24 26  --   GLUCOSE 168* 129*  --   BUN 11 17  --   CREATININE 0.83 0.89 0.49  CALCIUM 8.8* 8.4*  --   GFRNONAA >60 >60 >60  ANIONGAP 9 7  --      Hematology Recent Labs  Lab 05/15/21 1740 05/17/21 0334 05/18/21 0526  WBC 17.3* 26.8* 16.3*  RBC 3.29* 3.22* 2.94*  HGB 9.7* 9.5* 8.6*  HCT 31.6* 29.2* 27.7*   MCV 96.0 90.7 94.2  MCH 29.5 29.5 29.3  MCHC 30.7 32.5 31.0  RDW 13.8 13.6 13.8  PLT 189 223 228    Cardiac EnzymesNo results for input(s): TROPONINI in the last 168 hours. No results for input(s): TROPIPOC in the last 168 hours.   BNPNo results for input(s): BNP, PROBNP in the last 168 hours.   DDimer No results for input(s): DDIMER in the last 168 hours.   Radiology    No results found.  Cardiac Studies   Echo:  Limited study.  Conclusion(s)/Recommendation(s): Subcostal view. Limited windows. Grossly LV and RV function appear normal. EF 60-65%.  Patient Profile     87 y.o. 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 Shanon Brow.  Assessment & Plan    Post op follow up:  Asked to see preop.  Now status post.  Now status post right  hip hemiarthroplasty and reverse shoulder right arthroplasty.  No apparent cardiac complications.  Chart reviewed.  Patient interviewed.  No change in therapy.  No further testing.  Follow up with [place] prn. We will sign off.  Please call with further questions.     For questions or updates, please contact [place] Please consult [email or web address] for contact info under Cardiology/STEMI.   Signed, Minus Breeding, MD  05/18/2021, 9:52 AM

## 2021-05-18 NOTE — Progress Notes (Signed)
Progress Note    [name] [name]  MWU:[id number] DOB: [birth year]  DOA: 05/12/2021 PCP: Leonard Downing, MD      Brief Narrative:    Medical records reviewed and are as summarized below:  [name] [name] is a 87 y.o. female  with medical history significant of stroke, HTN, CAD s/p stent in 2008, NSCLC of LLL, s/p radiation in 2019-2020 in remission / improved as of CT chest in 2021.  She presented to the hospital after mechanical fall that occurred while she was getting out of her car at home.  She fell on her right side and sustained injury to her right hip and right shoulder.  She was found to have right proximal humerus fracture and right hip fracture.  She underwent right reverse total shoulder arthroplasty and right hip hemiarthroplasty.  She was treated with analgesics.  Orthopedic surgeon recommended DVT prophylaxis with Lovenox for 4 weeks.  She has a history of lung cancer s/p radiation therapy with subsequent remission.  However, CT chest obtained on this admission showed likely recurrence of left lower lobe lung cancer.  Outpatient follow-up with oncologist was strongly recommended.   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  S/p mechanical fall leading to closed fracture of right shoulder and closed fracture of right hip: S/p reverse total right shoulder arthroplasty on 05/15/2021, s/p right hip hemiarthroplasty on 05/13/2021.  Continue analgesics as needed for pain.  Continue PT and OT while  inpatient.  Awaiting placement to SNF.  Orthopedic surgeon recommended 4 weeks of Lovenox for DVT prophylaxis.  CAD s/p coronary stent: Continue aspirin  Recurrence of left lower lobe lung cancer: S/p radiation therapy in 2019 and early 2020.  Reportedly, patient was in remission.  However, CT chest on this admission shows likely recurrence of malignancy.  Outpatient follow-up with oncologist recommended.    Hypertension: Continue felodipine  Acute blood loss anemia: No indication for blood transfusion at this time.  Leukocytosis: Improving.  This is likely reactive.  Monitor CBC     Diet Order             Diet regular Room service appropriate? Yes; Fluid consistency: Thin  Diet effective now                           Consultants: Orthopedic surgeon  Procedures: Right hip hemiarthroplasty, right shoulder arthroplasty    Medications:    Chlorhexidine Gluconate Cloth  6 each Topical Daily   docusate sodium  100 mg Oral BID   enoxaparin (LOVENOX) injection  40 mg Subcutaneous Q24H   feeding supplement  237 mL Oral BID BM   felodipine  10 mg Oral Daily   multivitamin with minerals  1 tablet Oral Daily   rosuvastatin  20 mg Oral Daily   Continuous Infusions:     Anti-infectives (From admission, onward)    Start     Dose/Rate Route Frequency Ordered Stop   05/15/21 2000  ceFAZolin (ANCEF) IVPB 1 g/50 mL premix  1 g 100 mL/hr over 30 Minutes Intravenous Every 6 hours 05/15/21 1708 05/16/21 1100   05/15/21 1454  vancomycin (VANCOCIN) powder  Status:  Discontinued          As needed 05/15/21 1455 05/15/21 1659   05/15/21 1215  ceFAZolin (ANCEF) IVPB 2g/100 mL premix        2 g 200 mL/hr over 30 Minutes Intravenous On call to O.R. 05/15/21 1128 05/15/21 1353   05/13/21 2200  ceFAZolin (ANCEF) IVPB 2g/100 mL premix        2 g 200 mL/hr over 30 Minutes Intravenous Every 8 hours 05/13/21 1848 05/14/21 0545   05/13/21 1245  ceFAZolin (ANCEF) IVPB 2g/100  mL premix        2 g 200 mL/hr over 30 Minutes Intravenous On call to O.R. 05/13/21 1232 05/13/21 1442              Family Communication/Anticipated D/C date and plan/Code Status   DVT prophylaxis: enoxaparin (LOVENOX) injection 40 mg Start: 05/16/21 0800 SCD's Start: 05/15/21 1709 SCDs Start: 05/13/21 0051     Code Status: Full Code  Family Communication: Plan discussed with her husband at the bedside Disposition Plan: Plan to discharge to SNF   Status is: Inpatient Remains inpatient appropriate because: Awaiting placement to SNF               Subjective:   C/o pain in the right shoulder and right hip.  Her husband was at the bedside  Objective:    Vitals:   05/17/21 1429 05/17/21 2056 05/18/21 0500 05/18/21 0906  BP: (!) 114/51 110/76 109/68   Pulse: (!) 102 (!) 103 92 96  Resp: 16 15 14 15   Temp: 98.1 F (36.7 C) 97.8 F (36.6 C) 98.7 F (37.1 C) 98.7 F (37.1 C)  TempSrc: Oral Oral  Oral  SpO2: 94% 92% 95% 91%  Weight:      Height:       No data found.   Intake/Output Summary (Last 24 hours) at 05/18/2021 1149 Last data filed at 05/18/2021 0600 Gross per 24 hour  Intake 360 ml  Output 750 ml  Net -390 ml   Filed Weights   05/13/21 1327 05/15/21 1221  Weight: 48.1 kg 48.1 kg    Exam:  GEN: NAD SKIN: Warm and  dry EYES: No pallor or icterus ENT: MMM CV: RRR PULM: CTA B ABD: soft, ND, NT, +BS CNS: AAO x 3, non focal EXT: Dressing on the right shoulder and right hip surgical wounds are clean, dry and intact      Data Reviewed:   I have personally reviewed following labs and imaging studies:  Labs: Labs show the following:   Basic Metabolic Panel: Recent Labs  Lab 05/12/21 2258 05/14/21 0632 05/15/21 1740  NA 137 136  --   K 3.7 4.3  --   CL 104 103  --   CO2 24 26  --   GLUCOSE 168* 129*  --   BUN 11 17  --   CREATININE 0.83 0.89 0.49  CALCIUM 8.8* 8.4*  --    GFR Estimated Creatinine Clearance: 39.7  mL/min (by C-G formula based on SCr of 0.49 mg/dL). Liver Function Tests: No results for input(s): AST, ALT, ALKPHOS, BILITOT, PROT, ALBUMIN in the last 168 hours. No results for input(s): LIPASE, AMYLASE in the last 168 hours. No results for input(s): AMMONIA in the last 168 hours. Coagulation profile Recent Labs  Lab 05/12/21 2258  INR 1.1  CBC: Recent Labs  Lab 05/12/21 2258 05/14/21 0632 05/15/21 1740 05/17/21 0334 05/18/21 0526  WBC 22.4* 21.1* 17.3* 26.8* 16.3*  NEUTROABS 19.3*  --   --   --  11.6*  HGB 12.6 9.8* 9.7* 9.5* 8.6*  HCT 38.3 30.1* 31.6* 29.2* 27.7*  MCV 89.7 90.1 96.0 90.7 94.2  PLT 256 174 189 223 228   Cardiac Enzymes: No results for input(s): CKTOTAL, CKMB, CKMBINDEX, TROPONINI in the last 168 hours. BNP (last 3 results) No results for input(s): PROBNP in the last 8760 hours. CBG: No results for input(s): GLUCAP in the last 168 hours. D-Dimer: No results for input(s): DDIMER in the last 72 hours. Hgb A1c: No results for input(s): HGBA1C in the last 72 hours. Lipid Profile: No results for input(s): CHOL, HDL, LDLCALC, TRIG, CHOLHDL, LDLDIRECT in the last 72 hours. Thyroid function studies: No results for input(s): TSH, T4TOTAL, T3FREE, THYROIDAB in the last 72 hours.  Invalid input(s): FREET3 Anemia work up: No results for input(s): VITAMINB12, FOLATE, FERRITIN, TIBC, IRON, RETICCTPCT in the last 72 hours. Sepsis Labs: Recent Labs  Lab 05/14/21 2951 05/15/21 1740 05/17/21 0334 05/18/21 0526  WBC 21.1* 17.3* 26.8* 16.3*    Microbiology Recent Results (from the past 240 hour(s))  SARS Coronavirus 2 by RT PCR (hospital order, performed in [facility] hospital lab) Nasopharyngeal Nasopharyngeal Swab     Status: None   Collection Time: 05/13/21 11:13 AM   Specimen: Nasopharyngeal Swab  Result Value Ref Range Status   SARS Coronavirus 2 NEGATIVE NEGATIVE Final    Comment: (NOTE) SARS-CoV-2 target nucleic acids are NOT DETECTED.  The  SARS-CoV-2 RNA is generally detectable in upper and lower respiratory specimens during the acute phase of infection. The lowest concentration of SARS-CoV-2 viral copies this assay can detect is 250 copies / mL. A negative result does not preclude SARS-CoV-2 infection and should not be used as the sole basis for treatment or other patient management decisions.  A negative result may occur with improper specimen collection / handling, submission of specimen other than nasopharyngeal swab, presence of viral mutation(s) within the areas targeted by this assay, and inadequate number of viral copies (<250 copies / mL). A negative result must be combined with clinical observations, patient history, and epidemiological information.  Fact Sheet for Patients:   StrictlyIdeas.no  Fact Sheet for Healthcare Providers: BankingDealers.co.za  This test is not yet approved or  cleared by the [place] FDA and has been authorized for detection and/or diagnosis of SARS-CoV-2 by FDA under an Emergency Use Authorization (EUA).  This EUA will remain in effect (meaning this test can be used) for the duration of the COVID-19 declaration under Section 564(b)(1) of the Act, 21 U.S.C. section 360bbb-3(b)(1), unless the authorization is terminated or revoked sooner.  Performed at [facility], [place] [address], [place], [place] [address]     Procedures and diagnostic studies:  No results found.             LOS: 5 days   [place] Segler  [facility]   Pager on [email or web address]. If 7PM-7AM, please contact night-coverage at [email or web address]     05/18/2021, 11:49 AM

## 2021-05-18 NOTE — Progress Notes (Signed)
Physical Therapy Treatment Patient Details Name: [name] [name] MRN: [id number] DOB: [birth year] Today's Date: 05/18/2021   History of Present Illness Pt s/p fall with R shoulder and hip fx and now s/p R reverse TSR and R hip hemi-arthroplasty by posterior approach.  Pt with hx of CAD, ischemic stroke and back surgery    PT Comments    Pt is progressing slowly. Incr ability to self assist to come to EOB however continues to require 2 assist to return to supine. Emphasis on trunk control/sitting balance today. Pt able to sit EOB x 8 minutes with max assist   Recommendations for follow up therapy are one component of a multi-disciplinary discharge planning process, led by the attending physician.  Recommendations may be updated based on patient status, additional functional criteria and insurance authorization.  Follow Up Recommendations  Skilled nursing-short term rehab (<3 hours/day)     Assistance Recommended at Discharge Frequent or constant Supervision/Assistance  Patient can return home with the following Two people to help with walking and/or transfers;A lot of help with bathing/dressing/bathroom;Assistance with cooking/housework;Help with stairs or ramp for entrance;Assist for transportation   Equipment Recommendations  None recommended by PT    Recommendations for Other Services       Precautions / Restrictions Precautions Precautions: Posterior Hip;Fall;Shoulder Type of Shoulder Precautions: No AROM, No PROM Shoulder Interventions: Shoulder sling/immobilizer;Off for dressing/bathing/exercises Precaution Comments: Okay to remove sling to weight bear with a walker Required Braces or Orthoses: Sling Restrictions Weight Bearing Restrictions: Yes RUE Weight Bearing: Non weight bearing RLE Weight Bearing: Weight bearing as tolerated Other Position/Activity Restrictions: Allowed limited WB for use of RW per second order     Mobility  Bed Mobility Overal bed mobility: Needs  Assistance Bed Mobility: Supine to Sit, Sit to Supine     Supine to sit: Total assist, HOB elevated Sit to supine: Total assist, +2 for physical assistance, +2 for safety/equipment   General bed mobility comments: Total assist to transfer back in to supine. Patient was able to assist with partially moving hips over while in bed and self assist slightly to elevate trunk. incr time needed    Transfers                   General transfer comment: deferred d/t extensive assist required to remain in sitting    Ambulation/Gait                   Stairs             Wheelchair Mobility    Modified Rankin (Stroke Patients Only)       Balance   Sitting-balance support: Single extremity supported, Feet supported Sitting balance-Leahy Scale: Zero Sitting balance - Comments: zero to poor; sat EOB x 10 minutes wtih emphasis on midline, trunk extension, avoiding WBing on R UE/elbow in sitting Postural control: Right lateral lean                                  Cognition Arousal/Alertness: Awake/alert Behavior During Therapy: WFL for tasks assessed/performed Overall Cognitive Status: History of cognitive impairments - at baseline                                 General Comments: Spouse reports she has memory deficits and that she says "odd things" at baseline  Exercises Total Joint Exercises Ankle Circles/Pumps: AROM, Both, 20 reps, Supine Heel Slides: AAROM, Right, 10 reps    General Comments        Pertinent Vitals/Pain Pain Assessment Pain Assessment: Faces Faces Pain Scale: Hurts whole lot Pain Location: R shoulder; limited c/o hip pain Pain Descriptors / Indicators: Aching, Sore, Grimacing, Guarding Pain Intervention(s): Limited activity within patient's tolerance, Monitored during session, Premedicated before session, Repositioned, Ice applied    Home Living                          Prior Function             PT Goals (current goals can now be found in the care plan section) Acute Rehab PT Goals Patient Stated Goal: Regain IND PT Goal Formulation: With patient Time For Goal Achievement: 05/30/21 Potential to Achieve Goals: Fair Progress towards PT goals: Progressing toward goals    Frequency    Min 3X/week      PT Plan Current plan remains appropriate    Co-evaluation              AM-PAC PT "6 Clicks" Mobility   Outcome Measure  Help needed turning from your back to your side while in a flat bed without using bedrails?: Total Help needed moving from lying on your back to sitting on the side of a flat bed without using bedrails?: Total Help needed moving to and from a bed to a chair (including a wheelchair)?: Total Help needed standing up from a chair using your arms (e.g., wheelchair or bedside chair)?: Total Help needed to walk in hospital room?: Total Help needed climbing 3-5 steps with a railing? : Total 6 Click Score: 6    End of Session Equipment Utilized During Treatment: Other (comment) (R sling) Activity Tolerance: Patient limited by fatigue;Patient limited by pain Patient left: in bed;with call bell/phone within reach;with bed alarm set;with family/visitor present Nurse Communication: Mobility status PT Visit Diagnosis: Difficulty in walking, not elsewhere classified (R26.2);Muscle weakness (generalized) (M62.81);Pain Pain - Right/Left: Right Pain - part of body: Hip;Shoulder     Time: 5462-7035 PT Time Calculation (min) (ACUTE ONLY): 24 min  Charges:  $Therapeutic Activity: 23-37 mins                     Baxter Flattery, PT  Acute Rehab Dept ([place]) [phone] Pager [phone]  05/18/2021    [facility] 05/18/2021, 11:03 AM

## 2021-05-18 NOTE — Plan of Care (Signed)
  Problem: Safety: Goal: Ability to remain free from injury will improve Outcome: Progressing   Problem: Pain Managment: Goal: General experience of comfort will improve Outcome: Progressing   

## 2021-05-18 NOTE — TOC Progression Note (Signed)
Transition of Care [facility]) - Progression Note   Patient Details  Name: [name] [name] MRN: [id number] Date of Birth: 10-16-1934  Transition of Care [facility]) CM/SW [place], LCSW Phone Number: 05/18/2021, 2:34 PM  Clinical Narrative: Family's first choice for rehab is [facility]. CSW followed up with Linus Orn in admissions at [facility] and a bed offer was extended. CSW completed insurance authorization with [employer] portal. Reference ID # is: [id number]. Patient has been approved for 05/19/2021-05/28/2021. TOC to follow.  Expected Discharge Plan: [place] Barriers to Discharge: Continued Medical Work up  Expected Discharge Plan and Services Expected Discharge Plan: [place] In-house Referral: Clinical Social Work Post Acute Care Choice: [place] Living arrangements for the past 2 months: Single Family Home  Readmission Risk Interventions No flowsheet data found.

## 2021-05-18 NOTE — Telephone Encounter (Signed)
The patient's husband called to let us know that she had a CT scan in the hospital and it had findings in her lung along the previous site of treatment. I looked back and I think her orders for CT scans were accidentally deleted as many had been early on in the pandemic. Unfortunately it's only when pt's call that we find out they were lost to follow up. 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 I will review her scans tomorrow with Dr. Lisbeth Renshaw and let them know his recommendations which I suspect will be interval CT vs PET. She is currently hospitalized following hip and shoulder injuries and will go to rehab for several weeks. We will try to plan for scans a few weeks from now so she can become more functional.

## 2021-05-18 NOTE — Plan of Care (Signed)
  Problem: Coping: Goal: Level of anxiety will decrease Outcome: Progressing   Problem: Pain Managment: Goal: General experience of comfort will improve Outcome: Progressing   Problem: Safety: Goal: Ability to remain free from injury will improve Outcome: Progressing   

## 2021-05-19 ENCOUNTER — Encounter: Payer: Self-pay | Admitting: Radiation Oncology

## 2021-05-19 ENCOUNTER — Telehealth: Payer: Self-pay | Admitting: Radiation Oncology

## 2021-05-19 ENCOUNTER — Encounter (HOSPITAL_COMMUNITY): Payer: Self-pay | Admitting: Orthopaedic Surgery

## 2021-05-19 DIAGNOSIS — C3490 Malignant neoplasm of unspecified part of unspecified bronchus or lung: Secondary | ICD-10-CM

## 2021-05-19 LAB — CBC WITH DIFFERENTIAL/PLATELET
Abs Immature Granulocytes: 0.11 10*3/uL — ABNORMAL HIGH (ref 0.00–0.07)
Basophils Absolute: 0 10*3/uL (ref 0.0–0.1)
Basophils Relative: 0 %
Eosinophils Absolute: 0.4 10*3/uL (ref 0.0–0.5)
Eosinophils Relative: 2 %
HCT: 27.8 % — ABNORMAL LOW (ref 36.0–46.0)
Hemoglobin: 9 g/dL — ABNORMAL LOW (ref 12.0–15.0)
Immature Granulocytes: 1 %
Lymphocytes Relative: 14 %
Lymphs Abs: 2.2 10*3/uL (ref 0.7–4.0)
MCH: 29.4 pg (ref 26.0–34.0)
MCHC: 32.4 g/dL (ref 30.0–36.0)
MCV: 90.8 fL (ref 80.0–100.0)
Monocytes Absolute: 1.8 10*3/uL — ABNORMAL HIGH (ref 0.1–1.0)
Monocytes Relative: 11 %
Neutro Abs: 11.3 10*3/uL — ABNORMAL HIGH (ref 1.7–7.7)
Neutrophils Relative %: 72 %
Platelets: 278 10*3/uL (ref 150–400)
RBC: 3.06 MIL/uL — ABNORMAL LOW (ref 3.87–5.11)
RDW: 13.5 % (ref 11.5–15.5)
WBC: 15.8 10*3/uL — ABNORMAL HIGH (ref 4.0–10.5)
nRBC: 0 % (ref 0.0–0.2)

## 2021-05-19 MED ORDER — TRAMADOL HCL 50 MG PO TABS: 50.0000 mg | ORAL_TABLET | Freq: Four times a day (QID) | ORAL | 0 refills | Status: AC | PRN

## 2021-05-19 MED ORDER — ENSURE ENLIVE PO LIQD: 237.0000 mL | Freq: Two times a day (BID) | ORAL | 12 refills | Status: AC

## 2021-05-19 MED ORDER — ACETAMINOPHEN 500 MG PO TABS: 500.0000 mg | ORAL_TABLET | ORAL | 0 refills | Status: AC | PRN

## 2021-05-19 MED ORDER — ENOXAPARIN SODIUM 40 MG/0.4ML IJ SOSY: 40.0000 mg | PREFILLED_SYRINGE | INTRAMUSCULAR | 0 refills | Status: AC

## 2021-05-19 MED ORDER — HYDROCODONE-ACETAMINOPHEN 5-325 MG PO TABS: 1.0000 | ORAL_TABLET | Freq: Four times a day (QID) | ORAL | 0 refills | Status: AC | PRN

## 2021-05-19 MED ORDER — POLYETHYLENE GLYCOL 3350 17 G PO PACK: 17.0000 g | PACK | Freq: Every day | ORAL | 0 refills | Status: AC | PRN

## 2021-05-19 MED ORDER — ROSUVASTATIN CALCIUM 20 MG PO TABS: 20.0000 mg | ORAL_TABLET | Freq: Every day | ORAL | 0 refills | Status: AC

## 2021-05-19 MED ORDER — DOCUSATE SODIUM 100 MG PO CAPS: 100.0000 mg | ORAL_CAPSULE | Freq: Two times a day (BID) | ORAL | 0 refills | Status: AC

## 2021-05-19 NOTE — Telephone Encounter (Signed)
I attempted to leave a voicemail for the patient's husband but phone continued to ring. I called back and his voicemail is full. I sent a mychart message instead.

## 2021-05-19 NOTE — Plan of Care (Signed)
  Problem: Safety: Goal: Ability to remain free from injury will improve Outcome: Progressing   Problem: Pain Managment: Goal: General experience of comfort will improve Outcome: Progressing   

## 2021-05-19 NOTE — Discharge Summary (Signed)
Physician Discharge Summary  Tuyet Bader TKW:[id number] DOB: Dec 25, 1934 DOA: 05/12/2021  PCP: Leonard Downing, MD  Admit date: 05/12/2021  Discharge date: 05/19/2021  Admitted From:Home    Disposition:  SNF  Recommendations for Outpatient Follow-up:  Follow up with PCP in 1-2 weeks Follow-up with orthopedics as scheduled Follow-up with oncology regarding likely recurrence of left lower lobe lung cancer with referral sent Continue Lovenox as prescribed for next 30 days Pain medications as noted below Continue other home medications as prior  Home Health: None  Equipment/Devices: None  Discharge Condition:Stable  CODE STATUS: Full  Diet recommendation: Heart Healthy  Brief/Interim Summary: Ashlynne Shetterly is a 87 y.o. female  with medical history significant of stroke, HTN, CAD s/p stent in 2008, NSCLC of LLL, s/p radiation in 2019-2020 in remission / improved as of CT chest in 2021.  She presented to the hospital after mechanical fall that occurred while she was getting out of her car at home.  She fell on her right side and sustained injury to her right hip and right shoulder.  She was found to have right proximal humerus fracture and right hip fracture.  She underwent right reverse total shoulder arthroplasty and right hip hemiarthroplasty.  She was treated with analgesics.  Orthopedic surgeon recommended DVT prophylaxis with Lovenox for 4 weeks.   She has a history of lung cancer s/p radiation therapy with subsequent remission.  However, CT chest obtained on this admission showed likely recurrence of left lower lobe lung cancer.  Outpatient follow-up with oncologist was strongly recommended and referral has been sent.  Discharge Diagnoses:  Principal Problem:   Closed displaced fracture of right femoral neck (HCC) Active Problems:   Essential hypertension   Coronary atherosclerosis   Malignant 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 (HCC)   Closed fracture of right  shoulder   Malnutrition of moderate degree  Principal discharge diagnosis: Closed fracture of right shoulder and right hip status post mechanical fall.  Recurrence of left lower lobe lung cancer.  Discharge Instructions  Discharge Instructions     Diet - low sodium heart healthy   Complete by: As directed    If the dressing is still on your incision site when you go home, remove it on the third day after your surgery date. Remove dressing if it begins to fall off, or if it is dirty or damaged before the third day.   Complete by: As directed    Increase activity slowly   Complete by: As directed       Allergies as of 05/19/2021       Reactions   Other Anaphylaxis, Swelling   Food or preservative allergy of unknown/undetermined origin (happened last in 2017 when the patient had gone out to eat and hadn't yet touched her steak??)        Medication List     TAKE these medications    acetaminophen 500 MG tablet Commonly known as: TYLENOL Take 1 tablet (500 mg total) by mouth every 4 (four) hours as needed for mild pain, moderate pain or fever.   CITRACAL PO Take 2 tablets by mouth 2 (two) times a week.   docusate sodium 100 MG capsule Commonly known as: COLACE Take 1 capsule (100 mg total) by mouth 2 (two) times daily.   enoxaparin 40 MG/0.4ML injection Commonly known as: LOVENOX Inject 0.4 mLs (40 mg total) into the skin daily. Start taking on: May 20, 2021   EPINEPHrine 0.3 mg/0.3 mL Soaj injection Commonly  known as: EPI-PEN Inject 0.3 mg into the muscle once as needed (for anaphylaxis).   feeding supplement Liqd Take 237 mLs by mouth 2 (two) times daily between meals.   felodipine 10 MG 24 hr tablet Commonly known as: PLENDIL Take 10 mg by mouth daily.   HYDROcodone-acetaminophen 5-325 MG tablet Commonly known as: NORCO/VICODIN Take 1 tablet by mouth every 6 (six) hours as needed for severe pain or moderate pain.   multivitamin with minerals Tabs  tablet Take 1 tablet by mouth daily. What changed: when to take this   nitroGLYCERIN 0.4 MG SL tablet Commonly known as: NITROSTAT Place 1 tablet (0.4 mg total) under the tongue every 5 (five) minutes as needed for chest pain.   polyethylene glycol 17 g packet Commonly known as: MIRALAX / GLYCOLAX Take 17 g by mouth daily as needed for mild constipation.   rosuvastatin 20 MG tablet Commonly known as: CRESTOR Take 1 tablet (20 mg total) by mouth daily. Start taking on: May 20, 2021   traMADol 50 MG tablet Commonly known as: ULTRAM Take 1 tablet (50 mg total) by mouth every 6 (six) hours as needed for moderate pain or severe pain.   Vitamin D3 50 MCG (2000 UT) Tabs Take 2,000 Units by mouth daily.               Discharge Care Instructions  (From admission, onward)           Start     Ordered   05/19/21 0000  If the dressing is still on your incision site when you go home, remove it on the third day after your surgery date. Remove dressing if it begins to fall off, or if it is dirty or damaged before the third day.        05/19/21 0902            Contact information for follow-up providers     Willaim Sheng, MD Follow up in 1 week(s).   Specialty: Orthopedic Surgery Contact information: [address] [place] [address] [phone]         Hiram Gash, MD Follow up in 1 week(s).   Specialty: Orthopedic Surgery Contact information: [address] [address] [phone]              Contact information for after-discharge care     Destination     [facility] .   Service: Skilled Nursing Contact information: [place] [address] [place] [phone]                    Allergies  Allergen Reactions   Other Anaphylaxis and Swelling    Food or preservative allergy of unknown/undetermined origin (happened last in 2017  when the patient had gone out to eat and hadn't yet touched her steak??)    Consultations: Orthopedics Cardiology   Procedures/Studies: DG Chest 1 View  Result Date: 05/13/2021 CLINICAL DATA:  Fall EXAM: CHEST  1 VIEW COMPARISON:  04/21/2018 FINDINGS: Postoperative changes at the left base. Left basilar scarring. Right lung clear. Heart is normal size. Aortic atherosclerosis. There is hyperinflation of the lungs compatible with COPD. IMPRESSION: COPD/chronic changes.  No active disease. Electronically Signed   By: Rolm Baptise M.D.   On: 05/13/2021 00:18   DG Shoulder Right  Result Date: 05/12/2021 CLINICAL DATA:  Tripped and fell, pain EXAM: RIGHT SHOULDER - 2+ VIEW COMPARISON:  None. FINDINGS: Frontal and transscapular views of the right shoulder are obtained. There is anterior dislocation of the glenohumeral joint. Abnormal angulation of the humeral neck consistent with an impacted fracture. No other acute bony abnormalities. Right chest is clear. IMPRESSION: 1. Impacted right humeral neck fracture. 2. Anterior glenohumeral dislocation. Electronically Signed   By: Randa Ngo M.D.   On: 05/12/2021 22:23   CT Head Wo Contrast  Result Date: 05/13/2021 CLINICAL DATA:  Head trauma, minor (Age >= 65y); Neck trauma (Age >= 65y). Fall. EXAM: CT HEAD WITHOUT CONTRAST CT CERVICAL SPINE WITHOUT CONTRAST TECHNIQUE: Multidetector CT imaging of the head and cervical spine was performed following the standard protocol without intravenous contrast. Multiplanar CT image reconstructions of the cervical spine were also generated. RADIATION DOSE REDUCTION: This exam was performed according to the departmental dose-optimization program which includes automated exposure control, adjustment of the mA and/or kV according to patient size and/or use of iterative reconstruction technique. COMPARISON:  None. FINDINGS: CT HEAD FINDINGS Brain: Normal anatomic configuration. Parenchymal volume loss is commensurate with the  patient's age. Moderate periventricular white matter changes are present likely reflecting the sequela of small vessel ischemia. Remote lacunar infarct noted within the right basal ganglia and peripheral right cerebellar hemisphere. No abnormal intra or extra-axial mass lesion or fluid collection. No abnormal mass effect or midline shift. No evidence of acute intracranial hemorrhage or infarct. Ventricular size is normal. Cerebellum unremarkable. Vascular: No asymmetric hyperdense vasculature at the skull base. Skull: Intact Sinuses/Orbits: Paranasal sinuses are clear. Orbits are unremarkable. Other: Mastoid air cells and middle ear cavities are clear. CT CERVICAL SPINE FINDINGS Alignment: 2 mm anterolisthesis of C4-5 and C7-T1 and minimal retrolisthesis of C5-6 are likely degenerative in nature. Similarly, mild reversal of the normal cervical lordosis at C3-C6 is likely degenerative in nature. Skull base and vertebrae: Craniocervical alignment is normal. The atlantodental interval is not widened. The osseous structures are diffusely osteopenic. No acute fracture of the cervical spine. Vertebral body height is preserved. There is ankylosis of the right C2-3 facet joint. Soft tissues and spinal canal: There is mild central canal stenosis at C5-6 due to a combination of minimal retrolisthesis as well as posterior disc osteophyte complex with abutment and minimal flattening of the thecal sac. Similar changes are noted at C6-7. Spinal canal is otherwise widely patent. No canal hematoma. The prevertebral soft tissues are not thickened and there is no paravertebral fluid collection identified. Extensive calcification within the left carotid bifurcation. No pathologic adenopathy. Disc levels: There is intervertebral disc space narrowing and endplate remodeling at Y1-V4 in keeping with changes of moderate to severe degenerative disc disease. Disc calcifications within the remaining intervertebral disc spaces are compatible  with mild degenerative changes at these levels. Prevertebral soft tissues are not thickened. Review of the axial images demonstrates multilevel uncovertebral and facet arthrosis resulting in multilevel moderate to severe neuroforaminal narrowing, most severe on the right at C4-5 and C5-6 Upper chest: Advanced emphysema. Other: None IMPRESSION: No acute intracranial abnormality.  No calvarial fracture. No acute fracture or listhesis of the cervical spine. Emphysema (ICD10-J43.9). Electronically Signed   By: Fidela Salisbury M.D.   On: 05/13/2021 00:22   CT CHEST WO CONTRAST  Result Date: 05/13/2021 CLINICAL DATA:  87 year old female with history of non-small cell lung cancer. Patient diagnosed in 2019 status post radiation therapy to the left lung. History of trauma from a fall yesterday evening. Low oxygen saturations. EXAM: CT CHEST WITHOUT CONTRAST TECHNIQUE: Multidetector CT imaging of the  chest was performed following the standard protocol without IV contrast. RADIATION DOSE REDUCTION: This exam was performed according to the departmental dose-optimization program which includes automated exposure control, adjustment of the mA and/or kV according to patient size and/or use of iterative reconstruction technique. COMPARISON:  Chest CT 08/24/2019. FINDINGS: Cardiovascular: Heart size is normal. There is no significant pericardial fluid, thickening or pericardial calcification. There is aortic atherosclerosis, as well as atherosclerosis of the great vessels of the mediastinum and the coronary arteries, including calcified atherosclerotic plaque in the left main, left anterior descending, left circumflex and right coronary arteries. Mediastinum/Nodes: No pathologically enlarged mediastinal or hilar lymph nodes. Please note that accurate exclusion of hilar adenopathy is limited on noncontrast CT scans. Esophagus is unremarkable in appearance. No axillary lymphadenopathy. Lungs/Pleura: No pneumothorax. No acute  consolidative airspace disease. No pleural effusions. Fiducial markers in the left lower lobe are again noted, surrounded by a mass-like area of architectural distortion which is increased in size compared to prior examinations (axial image 83 of series 3 and coronal image 61 of series 4) measuring 3.5 x 2.2 x 1.9 cm on today's study, concerning for local recurrence of neoplasm. Small right upper lobe calcified granuloma. No other suspicious appearing pulmonary nodules or masses are noted. Diffuse bronchial wall thickening with mild to moderate centrilobular and paraseptal emphysema. Upper Abdomen: Atherosclerotic calcifications in the abdominal aorta. Small calcified granuloma incidentally noted in the liver. Musculoskeletal: Acute impacted right humeral neck fracture. There are no acute displaced fractures or aggressive appearing lytic or blastic lesions noted in the visualized portions of the skeleton. Several chronic appearing compression fractures are noted in the thoracic and lumbar spine at T2, T3, T6, T9, T10, T11, T12 and L1, most severe at L1 where there is up to 70% loss of central vertebral body height. IMPRESSION: 1. Acute impacted right humeral neck fracture partially imaged. 2. No other evidence of significant acute traumatic injury to the thorax. 3. Progressive mass-like enlargement of treated left lower lobe neoplasm which currently measures 3.5 x 2.2 x 1.9 cm, highly concerning for locally recurrent disease. Further evaluation with nonemergent PET-CT is recommended in the near future to better evaluate this finding. 4. Mild diffuse bronchial wall thickening with mild to moderate centrilobular and paraseptal emphysema; imaging findings suggestive of underlying COPD. 5. Aortic atherosclerosis, in addition to left main and three-vessel coronary artery disease. Aortic Atherosclerosis (ICD10-I70.0) and Emphysema (ICD10-J43.9). Electronically Signed   By: Vinnie Langton M.D.   On: 05/13/2021 06:03    CT Cervical Spine Wo Contrast  Result Date: 05/13/2021 CLINICAL DATA:  Head trauma, minor (Age >= 65y); Neck trauma (Age >= 65y). Fall. EXAM: CT HEAD WITHOUT CONTRAST CT CERVICAL SPINE WITHOUT CONTRAST TECHNIQUE: Multidetector CT imaging of the head and cervical spine was performed following the standard protocol without intravenous contrast. Multiplanar CT image reconstructions of the cervical spine were also generated. RADIATION DOSE REDUCTION: This exam was performed according to the departmental dose-optimization program which includes automated exposure control, adjustment of the mA and/or kV according to patient size and/or use of iterative reconstruction technique. COMPARISON:  None. FINDINGS: CT HEAD FINDINGS Brain: Normal anatomic configuration. Parenchymal volume loss is commensurate with the patient's age. Moderate periventricular white matter changes are present likely reflecting the sequela of small vessel ischemia. Remote lacunar infarct noted within the right basal ganglia and peripheral right cerebellar hemisphere. No abnormal intra or extra-axial mass lesion or fluid collection. No abnormal mass effect or midline shift. No evidence of acute intracranial hemorrhage  or infarct. Ventricular size is normal. Cerebellum unremarkable. Vascular: No asymmetric hyperdense vasculature at the skull base. Skull: Intact Sinuses/Orbits: Paranasal sinuses are clear. Orbits are unremarkable. Other: Mastoid air cells and middle ear cavities are clear. CT CERVICAL SPINE FINDINGS Alignment: 2 mm anterolisthesis of C4-5 and C7-T1 and minimal retrolisthesis of C5-6 are likely degenerative in nature. Similarly, mild reversal of the normal cervical lordosis at C3-C6 is likely degenerative in nature. Skull base and vertebrae: Craniocervical alignment is normal. The atlantodental interval is not widened. The osseous structures are diffusely osteopenic. No acute fracture of the cervical spine. Vertebral body height is  preserved. There is ankylosis of the right C2-3 facet joint. Soft tissues and spinal canal: There is mild central canal stenosis at C5-6 due to a combination of minimal retrolisthesis as well as posterior disc osteophyte complex with abutment and minimal flattening of the thecal sac. Similar changes are noted at C6-7. Spinal canal is otherwise widely patent. No canal hematoma. The prevertebral soft tissues are not thickened and there is no paravertebral fluid collection identified. Extensive calcification within the left carotid bifurcation. No pathologic adenopathy. Disc levels: There is intervertebral disc space narrowing and endplate remodeling at Z6-X0 in keeping with changes of moderate to severe degenerative disc disease. Disc calcifications within the remaining intervertebral disc spaces are compatible with mild degenerative changes at these levels. Prevertebral soft tissues are not thickened. Review of the axial images demonstrates multilevel uncovertebral and facet arthrosis resulting in multilevel moderate to severe neuroforaminal narrowing, most severe on the right at C4-5 and C5-6 Upper chest: Advanced emphysema. Other: None IMPRESSION: No acute intracranial abnormality.  No calvarial fracture. No acute fracture or listhesis of the cervical spine. Emphysema (ICD10-J43.9). Electronically Signed   By: Fidela Salisbury M.D.   On: 05/13/2021 00:22   CT Shoulder Right Wo Contrast  Result Date: 05/13/2021 CLINICAL DATA:  Right shoulder fracture dislocation EXAM: CT OF THE UPPER RIGHT EXTREMITY WITHOUT CONTRAST TECHNIQUE: Multidetector CT imaging of the upper right extremity was performed according to the standard protocol. RADIATION DOSE REDUCTION: This exam was performed according to the departmental dose-optimization program which includes automated exposure control, adjustment of the mA and/or kV according to patient size and/or use of iterative reconstruction technique. COMPARISON:  None. FINDINGS:  Bones/Joint/Cartilage The osseous structures are diffusely osteopenic. There is an acute impacted fracture of the a right humeral head with fracture planes involving the greater tuberosity and surgical neck of the humerus. The humeral shaft appears impacted within the humeral head by approximately 2 cm. Greater tuberosity fracture fragment appears minimally displaced. Fracture fragments are in grossly anatomic alignment. There is anteroinferior subluxation of the humeral head in relation of the glenoid fossa likely representing pseudosubluxation in the presence of a moderate right shoulder effusion. Superimposed degenerative changes are noted involving the glenohumeral articulation with chondrocalcinosis of the labrum and subchondral cyst formation within the glenoid. The visualized clavicle and the scapula appear intact. Visualized right thoracic cage appears intact. Ligaments Suboptimally assessed by CT. Muscles and Tendons Integrity of the rotator cuff is not confirmed on this examination. Pectoralis minor and short head biceps insertions upon the coracoid appear intact. There is fatty atrophy of the supraspinatus. Otherwise normal muscle bulk. Soft tissues Emphysema.  Moderate right shoulder effusion. IMPRESSION: Impacted three-part fracture of the right humeral head involving the greater tuberosity and surgical neck of the humerus. Grossly anatomic alignment of fracture fragments. Anteroinferior glenohumeral subluxation, possibly related to moderate right shoulder effusion. Superimposed glenohumeral degenerative arthritis. Fatty atrophy  of the supraspinatus. Integrity of the rotator cuff is not well assessed on this exam. Emphysema (ICD10-J43.9). Electronically Signed   By: Fidela Salisbury M.D.   On: 05/13/2021 00:32   DG Shoulder Right Port  Result Date: 05/15/2021 CLINICAL DATA:  Right shoulder replacement EXAM: RIGHT SHOULDER - 1 VIEW COMPARISON:  Right shoulder CT 05/13/2021 FINDINGS: Postsurgical  changes of reverse right total shoulder arthroplasty. Intact hardware without evidence of loosening or periprosthetic fracture. Expected soft tissue changes. IMPRESSION: Postsurgical changes reverse right total shoulder arthroplasty. No evidence of immediate hardware complication. Electronically Signed   By: Maurine Simmering M.D.   On: 05/15/2021 16:07   DG HIP OPERATIVE UNILAT W OR W/O PELVIS RIGHT  Result Date: 05/13/2021 CLINICAL DATA:  Fracture right hip EXAM: OPERATIVE right HIP (WITH PELVIS IF PERFORMED)  VIEWS TECHNIQUE: Fluoroscopic spot image(s) were submitted for interpretation post-operatively. COMPARISON:  05/12/2021 FINDINGS: There is interval right hip arthroplasty. No fracture is seen. There is no dislocation. Arterial calcifications are seen in the soft tissues. IMPRESSION: Interval right hip arthroplasty. Electronically Signed   By: Elmer Picker M.D.   On: 05/13/2021 17:49   DG Hip Unilat  With Pelvis 2-3 Views Right  Result Date: 05/12/2021 CLINICAL DATA:  Tripped and fell, right hip pain EXAM: DG HIP (WITH OR WITHOUT PELVIS) 2-3V RIGHT COMPARISON:  None. FINDINGS: Frontal view of the pelvis as well as frontal and cross-table lateral views of the right hip are obtained. There is an impacted subcapital right femoral neck fracture with varus angulation at the fracture site. No dislocation. Symmetrical bilateral hip joint space narrowing. Diffuse vascular calcifications. IMPRESSION: 1. Impacted subcapital right femoral neck fracture with varus angulation at the fracture site. Electronically Signed   By: Randa Ngo M.D.   On: 05/12/2021 22:24   ECHOCARDIOGRAM LIMITED  Result Date: 05/13/2021    ECHOCARDIOGRAM REPORT   Patient Name:   ANJANAE WOEHRLE Date of Exam: 05/13/2021 Medical Rec #:  [id number]    Height:       62.0 in Accession #:    [id number]   Weight:       106.0 lb Date of Birth:  03-21-1935    BSA:          1.460 m Patient Age:    37 years     BP:           141/81 mmHg Patient  Gender: F            HR:           104 bpm. Exam Location:  Inpatient Procedure: 2D Echo Indications:    Abnormal ECG R94.31  History:        Patient has prior history of Echocardiogram examinations, most                 recent 06/21/2017. CAD, Stroke and COPD; Risk                 Factors:Hypertension, Diabetes, Dyslipidemia and Former Smoker.                 Right hip fracture. History of lung cancer.  Sonographer:    Darlina Sicilian RDCS Referring Phys: [id number] DANIEL A MARCHWIANY  Sonographer Comments: Image acquisition challenging due to respiratory motion and Image acquisition challenging due to COPD. Stat Pre-Op with Dr. Johney Frame. Limited patient mobility.  Conclusion(s)/Recommendation(s): Subcostal view. Limited windows. Grossly LV and RV function appear normal. EF 60-65%. Phineas Inches Electronically signed by Phineas Inches Signature Date/Time:  05/13/2021/3:58:50 PM    Final      Discharge Exam: Vitals:   05/18/21 2131 05/19/21 0619  BP: 139/62 138/64  Pulse: (!) 102 88  Resp: 16 16  Temp: 97.9 F (36.6 C) 98.1 F (36.7 C)  SpO2:  98%   Vitals:   05/18/21 0906 05/18/21 1329 05/18/21 2131 05/19/21 0619  BP:  (!) 115/59 139/62 138/64  Pulse: 96 (!) 103 (!) 102 88  Resp: 15 15 16 16   Temp: 98.7 F (37.1 C) 98.6 F (37 C) 97.9 F (36.6 C) 98.1 F (36.7 C)  TempSrc: Oral  Oral Oral  SpO2: 91% 91%  98%  Weight:      Height:        General: Pt is alert, awake, not in acute distress Cardiovascular: RRR, S1/S2 +, no rubs, no gallops Respiratory: CTA bilaterally, no wheezing, no rhonchi Abdominal: Soft, NT, ND, bowel sounds + Extremities: no edema, no cyanosis    The results of significant diagnostics from this hospitalization (including imaging, microbiology, ancillary and laboratory) are listed below for reference.     Microbiology: Recent Results (from the past 240 hour(s))  SARS Coronavirus 2 by RT PCR (hospital order, performed in [facility] hospital lab) Nasopharyngeal  Nasopharyngeal Swab     Status: None   Collection Time: 05/13/21 11:13 AM   Specimen: Nasopharyngeal Swab  Result Value Ref Range Status   SARS Coronavirus 2 NEGATIVE NEGATIVE Final    Comment: (NOTE) SARS-CoV-2 target nucleic acids are NOT DETECTED.  The SARS-CoV-2 RNA is generally detectable in upper and lower respiratory specimens during the acute phase of infection. The lowest concentration of SARS-CoV-2 viral copies this assay can detect is 250 copies / mL. A negative result does not preclude SARS-CoV-2 infection and should not be used as the sole basis for treatment or other patient management decisions.  A negative result may occur with improper specimen collection / handling, submission of specimen other than nasopharyngeal swab, presence of viral mutation(s) within the areas targeted by this assay, and inadequate number of viral copies (<250 copies / mL). A negative result must be combined with clinical observations, patient history, and epidemiological information.  Fact Sheet for Patients:   StrictlyIdeas.no  Fact Sheet for Healthcare Providers: BankingDealers.co.za  This test is not yet approved or  cleared by the [place] FDA and has been authorized for detection and/or diagnosis of SARS-CoV-2 by FDA under an Emergency Use Authorization (EUA).  This EUA will remain in effect (meaning this test can be used) for the duration of the COVID-19 declaration under Section 564(b)(1) of the Act, 21 U.S.C. section 360bbb-3(b)(1), unless the authorization is terminated or revoked sooner.  Performed at [facility], [place] [address] [place], [place] [address]   Resp Panel by RT-PCR (Flu A&B, Covid) Nasopharyngeal Swab     Status: None   Collection Time: 05/18/21  3:31 PM   Specimen: Nasopharyngeal Swab; Nasopharyngeal(NP) swabs in vial transport medium  Result Value Ref Range Status   SARS Coronavirus 2 by  RT PCR NEGATIVE NEGATIVE Final    Comment: (NOTE) SARS-CoV-2 target nucleic acids are NOT DETECTED.  The SARS-CoV-2 RNA is generally detectable in upper respiratory specimens during the acute phase of infection. The lowest concentration of SARS-CoV-2 viral copies this assay can detect is 138 copies/mL. A negative result does not preclude SARS-Cov-2 infection and should not be used as the sole basis for treatment or other patient management decisions. A negative result may occur with  improper specimen collection/handling, submission of specimen other than nasopharyngeal swab, presence of viral mutation(s) within the areas targeted by this assay, and inadequate number of viral copies(<138 copies/mL). A negative result must be combined with clinical observations, patient history, and epidemiological information. The expected result is Negative.  Fact Sheet for Patients:  EntrepreneurPulse.com.au  Fact Sheet for Healthcare Providers:  IncredibleEmployment.be  This test is no t yet approved or cleared by the [place] FDA and  has been authorized for detection and/or diagnosis of SARS-CoV-2 by FDA under an Emergency Use Authorization (EUA). This EUA will remain  in effect (meaning this test can be used) for the duration of the COVID-19 declaration under Section 564(b)(1) of the Act, 21 U.S.C.section 360bbb-3(b)(1), unless the authorization is terminated  or revoked sooner.       Influenza A by PCR NEGATIVE NEGATIVE Final   Influenza B by PCR NEGATIVE NEGATIVE Final    Comment: (NOTE) The Xpert Xpress SARS-CoV-2/FLU/RSV plus assay is intended as an aid in the diagnosis of influenza from Nasopharyngeal swab specimens and should not be used as a sole basis for treatment. Nasal washings and aspirates are unacceptable for Xpert Xpress SARS-CoV-2/FLU/RSV testing.  Fact Sheet for Patients: EntrepreneurPulse.com.au  Fact Sheet  for Healthcare Providers: IncredibleEmployment.be  This test is not yet approved or cleared by the [place] FDA and has been authorized for detection and/or diagnosis of SARS-CoV-2 by FDA under an Emergency Use Authorization (EUA). This EUA will remain in effect (meaning this test can be used) for the duration of the COVID-19 declaration under Section 564(b)(1) of the Act, 21 U.S.C. section 360bbb-3(b)(1), unless the authorization is terminated or revoked.  Performed at [facility], [place] [address] [place],  [address]      Labs: BNP (last 3 results) No results for input(s): BNP in the last 8760 hours. Basic Metabolic Panel: Recent Labs  Lab 05/12/21 2258 05/14/21 0632 05/15/21 1740  NA 137 136  --   K 3.7 4.3  --   CL 104 103  --   CO2 24 26  --   GLUCOSE 168* 129*  --   BUN 11 17  --   CREATININE 0.83 0.89 0.49  CALCIUM 8.8* 8.4*  --    Liver Function Tests: No results for input(s): AST, ALT, ALKPHOS, BILITOT, PROT, ALBUMIN in the last 168 hours. No results for input(s): LIPASE, AMYLASE in the last 168 hours. No results for input(s): AMMONIA in the last 168 hours. CBC: Recent Labs  Lab 05/12/21 2258 05/14/21 0632 05/15/21 1740 05/17/21 0334 05/18/21 0526 05/19/21 0348  WBC 22.4* 21.1* 17.3* 26.8* 16.3* 15.8*  NEUTROABS 19.3*  --   --   --  11.6* 11.3*  HGB 12.6 9.8* 9.7* 9.5* 8.6* 9.0*  HCT 38.3 30.1* 31.6* 29.2* 27.7* 27.8*  MCV 89.7 90.1 96.0 90.7 94.2 90.8  PLT 256 174 189 223 228 278   Cardiac Enzymes: No results for input(s): CKTOTAL, CKMB, CKMBINDEX, TROPONINI in the last 168 hours. BNP: Invalid input(s): POCBNP CBG: No results for input(s): GLUCAP in the last 168 hours. D-Dimer No results for input(s): DDIMER in the last 72 hours. Hgb A1c No results for input(s): HGBA1C in the last 72 hours. Lipid Profile No results for input(s): CHOL, HDL, LDLCALC, TRIG, CHOLHDL, LDLDIRECT in the last 72  hours. Thyroid function studies No results for input(s): TSH, T4TOTAL, T3FREE, THYROIDAB in the last 72 hours.  Invalid input(s): FREET3 Anemia work up No results for input(s): VITAMINB12, FOLATE,  FERRITIN, TIBC, IRON, RETICCTPCT in the last 72 hours. Urinalysis    Component Value Date/Time   COLORURINE STRAW (A) 06/18/2017 2048   APPEARANCEUR CLEAR 06/18/2017 2048   LABSPEC 1.019 06/18/2017 2048   PHURINE 7.0 06/18/2017 2048   GLUCOSEU NEGATIVE 06/18/2017 2048   HGBUR NEGATIVE 06/18/2017 2048   BILIRUBINUR NEGATIVE 06/18/2017 2048   KETONESUR NEGATIVE 06/18/2017 2048   PROTEINUR NEGATIVE 06/18/2017 2048   UROBILINOGEN 0.2 08/23/2014 1502   NITRITE NEGATIVE 06/18/2017 2048   LEUKOCYTESUR NEGATIVE 06/18/2017 2048   Sepsis Labs Invalid input(s): PROCALCITONIN,  WBC,  LACTICIDVEN Microbiology Recent Results (from the past 240 hour(s))  SARS Coronavirus 2 by RT PCR (hospital order, performed in [place] hospital lab) Nasopharyngeal Nasopharyngeal Swab     Status: None   Collection Time: 05/13/21 11:13 AM   Specimen: Nasopharyngeal Swab  Result Value Ref Range Status   SARS Coronavirus 2 NEGATIVE NEGATIVE Final    Comment: (NOTE) SARS-CoV-2 target nucleic acids are NOT DETECTED.  The SARS-CoV-2 RNA is generally detectable in upper and lower respiratory specimens during the acute phase of infection. The lowest concentration of SARS-CoV-2 viral copies this assay can detect is 250 copies / mL. A negative result does not preclude SARS-CoV-2 infection and should not be used as the sole basis for treatment or other patient management decisions.  A negative result may occur with improper specimen collection / handling, submission of specimen other than nasopharyngeal swab, presence of viral mutation(s) within the areas targeted by this assay, and inadequate number of viral copies (<250 copies / mL). A negative result must be combined with clinical observations, patient history,  and epidemiological information.  Fact Sheet for Patients:   StrictlyIdeas.no  Fact Sheet for Healthcare Providers: BankingDealers.co.za  This test is not yet approved or  cleared by the [place] FDA and has been authorized for detection and/or diagnosis of SARS-CoV-2 by FDA under an Emergency Use Authorization (EUA).  This EUA will remain in effect (meaning this test can be used) for the duration of the COVID-19 declaration under Section 564(b)(1) of the Act, 21 U.S.C. section 360bbb-3(b)(1), unless the authorization is terminated or revoked sooner.  Performed at [facility], [place] [address] [place], [place] [address]   Resp Panel by RT-PCR (Flu A&B, Covid) Nasopharyngeal Swab     Status: None   Collection Time: 05/18/21  3:31 PM   Specimen: Nasopharyngeal Swab; Nasopharyngeal(NP) swabs in vial transport medium  Result Value Ref Range Status   SARS Coronavirus 2 by RT PCR NEGATIVE NEGATIVE Final    Comment: (NOTE) SARS-CoV-2 target nucleic acids are NOT DETECTED.  The SARS-CoV-2 RNA is generally detectable in upper respiratory specimens during the acute phase of infection. The lowest concentration of SARS-CoV-2 viral copies this assay can detect is 138 copies/mL. A negative result does not preclude SARS-Cov-2 infection and should not be used as the sole basis for treatment or other patient management decisions. A negative result may occur with  improper specimen collection/handling, submission of specimen other than nasopharyngeal swab, presence of viral mutation(s) within the areas targeted by this assay, and inadequate number of viral copies(<138 copies/mL). A negative result must be combined with clinical observations, patient history, and epidemiological information. The expected result is Negative.  Fact Sheet for Patients:  EntrepreneurPulse.com.au  Fact Sheet for Healthcare  Providers:  IncredibleEmployment.be  This test is no t yet approved or cleared by the [place] FDA and  has been authorized for detection and/or diagnosis of SARS-CoV-2  by FDA under an Emergency Use Authorization (EUA). This EUA will remain  in effect (meaning this test can be used) for the duration of the COVID-19 declaration under Section 564(b)(1) of the Act, 21 U.S.C.section 360bbb-3(b)(1), unless the authorization is terminated  or revoked sooner.       Influenza A by PCR NEGATIVE NEGATIVE Final   Influenza B by PCR NEGATIVE NEGATIVE Final    Comment: (NOTE) The Xpert Xpress SARS-CoV-2/FLU/RSV plus assay is intended as an aid in the diagnosis of influenza from Nasopharyngeal swab specimens and should not be used as a sole basis for treatment. Nasal washings and aspirates are unacceptable for Xpert Xpress SARS-CoV-2/FLU/RSV testing.  Fact Sheet for Patients: EntrepreneurPulse.com.au  Fact Sheet for Healthcare Providers: IncredibleEmployment.be  This test is not yet approved or cleared by the [place] FDA and has been authorized for detection and/or diagnosis of SARS-CoV-2 by FDA under an Emergency Use Authorization (EUA). This EUA will remain in effect (meaning this test can be used) for the duration of the COVID-19 declaration under Section 564(b)(1) of the Act, 21 U.S.C. section 360bbb-3(b)(1), unless the authorization is terminated or revoked.  Performed at [facility], [place] [address] [place], [place] [address]      Time coordinating discharge: 35 minutes  SIGNED:   Rodena Goldmann, DO [facility] 05/19/2021, 9:24 AM  If 7PM-7AM, please contact night-coverage [email or web address]

## 2021-05-19 NOTE — TOC Transition Note (Signed)
Transition of Care [facility]) - CM/SW Discharge Note   Patient Details  Name: [name] [name] MRN: [id number] Date of Birth: 09-16-1934  Transition of [facility] CM/SW Contact:  Lennart Pall, LCSW Phone Number: 05/19/2021, 12:49 PM   Clinical Narrative:    Pt medically cleared for dc today to [facility]. Pt/family aware and agreeable.  PTAR called at 12:45pm.  RN to call report to [phone].  No further TOC needs.   Final next level of care: Skilled Nursing Facility Barriers to Discharge: Barriers Resolved   Patient Goals and CMS Choice Patient states their goals for this hospitalization and ongoing recovery are:: To go to SNF then return back home.      Discharge Placement              Patient chooses bed at: [facility] [place] Patient to be transferred to facility by: [facility] Name of family member notified: spouse Patient and family notified of of transfer: 05/19/21  Discharge Plan and Services In-house Referral: Clinical Social Work   Post Acute Care Choice: [place]          DME Arranged: N/A DME Agency: NA                  Social Determinants of Health ([place]) Interventions     Readmission Risk Interventions No flowsheet data found.

## 2021-05-19 NOTE — Progress Notes (Signed)
Called and report given to nurse Caryl Pina.

## 2021-05-20 ENCOUNTER — Telehealth: Payer: Self-pay | Admitting: Internal Medicine

## 2021-05-20 NOTE — Telephone Encounter (Signed)
Scheduled appt per 2/14 referral. Pt's husband is aware of appt date and time. Pt's husband is aware to arrive 15 mins prior to appt time and to bring and updated insurance card. Pt's husband is aware of appt location.

## 2021-05-21 ENCOUNTER — Emergency Department (HOSPITAL_COMMUNITY): Payer: Medicare Other

## 2021-05-21 ENCOUNTER — Other Ambulatory Visit: Payer: Self-pay

## 2021-05-21 ENCOUNTER — Inpatient Hospital Stay (HOSPITAL_COMMUNITY)
Admission: EM | Admit: 2021-05-21 | Discharge: 2021-06-03 | DRG: 871 | Disposition: E | Payer: Medicare Other | Attending: Pulmonary Disease | Admitting: Pulmonary Disease

## 2021-05-21 ENCOUNTER — Encounter (HOSPITAL_COMMUNITY): Payer: Self-pay | Admitting: Emergency Medicine

## 2021-05-21 DIAGNOSIS — R0602 Shortness of breath: Secondary | ICD-10-CM | POA: Diagnosis present

## 2021-05-21 DIAGNOSIS — R6521 Severe sepsis with septic shock: Secondary | ICD-10-CM | POA: Diagnosis present

## 2021-05-21 DIAGNOSIS — A419 Sepsis, unspecified organism: Principal | ICD-10-CM | POA: Diagnosis present

## 2021-05-21 DIAGNOSIS — N179 Acute kidney failure, unspecified: Secondary | ICD-10-CM | POA: Diagnosis present

## 2021-05-21 DIAGNOSIS — E7800 Pure hypercholesterolemia, unspecified: Secondary | ICD-10-CM | POA: Diagnosis present

## 2021-05-21 DIAGNOSIS — I10 Essential (primary) hypertension: Secondary | ICD-10-CM | POA: Diagnosis present

## 2021-05-21 DIAGNOSIS — R000 Tachycardia, unspecified: Secondary | ICD-10-CM

## 2021-05-21 DIAGNOSIS — K567 Ileus, unspecified: Secondary | ICD-10-CM | POA: Diagnosis present

## 2021-05-21 DIAGNOSIS — Z96611 Presence of right artificial shoulder joint: Secondary | ICD-10-CM | POA: Diagnosis present

## 2021-05-21 DIAGNOSIS — Z8249 Family history of ischemic heart disease and other diseases of the circulatory system: Secondary | ICD-10-CM | POA: Diagnosis not present

## 2021-05-21 DIAGNOSIS — Z90711 Acquired absence of uterus with remaining cervical stump: Secondary | ICD-10-CM

## 2021-05-21 DIAGNOSIS — Z87891 Personal history of nicotine dependence: Secondary | ICD-10-CM | POA: Diagnosis not present

## 2021-05-21 DIAGNOSIS — Z515 Encounter for palliative care: Secondary | ICD-10-CM

## 2021-05-21 DIAGNOSIS — Z96641 Presence of right artificial hip joint: Secondary | ICD-10-CM | POA: Diagnosis present

## 2021-05-21 DIAGNOSIS — Z8673 Personal history of transient ischemic attack (TIA), and cerebral infarction without residual deficits: Secondary | ICD-10-CM

## 2021-05-21 DIAGNOSIS — J9601 Acute respiratory failure with hypoxia: Secondary | ICD-10-CM | POA: Diagnosis present

## 2021-05-21 DIAGNOSIS — G9341 Metabolic encephalopathy: Secondary | ICD-10-CM | POA: Diagnosis present

## 2021-05-21 DIAGNOSIS — Z85118 Personal history of other malignant neoplasm of bronchus and lung: Secondary | ICD-10-CM

## 2021-05-21 DIAGNOSIS — Z66 Do not resuscitate: Secondary | ICD-10-CM | POA: Diagnosis present

## 2021-05-21 DIAGNOSIS — Z923 Personal history of irradiation: Secondary | ICD-10-CM | POA: Diagnosis not present

## 2021-05-21 DIAGNOSIS — R34 Anuria and oliguria: Secondary | ICD-10-CM | POA: Diagnosis present

## 2021-05-21 DIAGNOSIS — Z955 Presence of coronary angioplasty implant and graft: Secondary | ICD-10-CM

## 2021-05-21 DIAGNOSIS — E872 Acidosis, unspecified: Secondary | ICD-10-CM | POA: Diagnosis present

## 2021-05-21 DIAGNOSIS — J9602 Acute respiratory failure with hypercapnia: Secondary | ICD-10-CM | POA: Diagnosis present

## 2021-05-21 DIAGNOSIS — I2510 Atherosclerotic heart disease of native coronary artery without angina pectoris: Secondary | ICD-10-CM | POA: Diagnosis present

## 2021-05-21 DIAGNOSIS — R1084 Generalized abdominal pain: Secondary | ICD-10-CM

## 2021-05-21 DIAGNOSIS — Z20822 Contact with and (suspected) exposure to covid-19: Secondary | ICD-10-CM | POA: Diagnosis present

## 2021-05-21 LAB — CBC WITH DIFFERENTIAL/PLATELET
Abs Immature Granulocytes: 0 10*3/uL (ref 0.00–0.07)
Band Neutrophils: 3 %
Basophils Absolute: 0 10*3/uL (ref 0.0–0.1)
Basophils Relative: 0 %
Eosinophils Absolute: 0 10*3/uL (ref 0.0–0.5)
Eosinophils Relative: 0 %
HCT: 35.4 % — ABNORMAL LOW (ref 36.0–46.0)
Hemoglobin: 10.7 g/dL — ABNORMAL LOW (ref 12.0–15.0)
Lymphocytes Relative: 7 %
Lymphs Abs: 2.2 10*3/uL (ref 0.7–4.0)
MCH: 29.4 pg (ref 26.0–34.0)
MCHC: 30.2 g/dL (ref 30.0–36.0)
MCV: 97.3 fL (ref 80.0–100.0)
Monocytes Absolute: 1.6 10*3/uL — ABNORMAL HIGH (ref 0.1–1.0)
Monocytes Relative: 5 %
Neutro Abs: 27.3 10*3/uL — ABNORMAL HIGH (ref 1.7–7.7)
Neutrophils Relative %: 85 %
Platelets: 394 10*3/uL (ref 150–400)
RBC: 3.64 MIL/uL — ABNORMAL LOW (ref 3.87–5.11)
RDW: 13.9 % (ref 11.5–15.5)
WBC: 31 10*3/uL — ABNORMAL HIGH (ref 4.0–10.5)
nRBC: 0.1 % (ref 0.0–0.2)

## 2021-05-21 LAB — I-STAT CHEM 8, ED
BUN: 32 mg/dL — ABNORMAL HIGH (ref 8–23)
Calcium, Ion: 1.03 mmol/L — ABNORMAL LOW (ref 1.15–1.40)
Chloride: 105 mmol/L (ref 98–111)
Creatinine, Ser: 1.4 mg/dL — ABNORMAL HIGH (ref 0.44–1.00)
Glucose, Bld: 121 mg/dL — ABNORMAL HIGH (ref 70–99)
HCT: 35 % — ABNORMAL LOW (ref 36.0–46.0)
Hemoglobin: 11.9 g/dL — ABNORMAL LOW (ref 12.0–15.0)
Potassium: 4.5 mmol/L (ref 3.5–5.1)
Sodium: 140 mmol/L (ref 135–145)
TCO2: 21 mmol/L — ABNORMAL LOW (ref 22–32)

## 2021-05-21 LAB — COMPREHENSIVE METABOLIC PANEL
ALT: 61 U/L — ABNORMAL HIGH (ref 0–44)
AST: 136 U/L — ABNORMAL HIGH (ref 15–41)
Albumin: 2.6 g/dL — ABNORMAL LOW (ref 3.5–5.0)
Alkaline Phosphatase: 108 U/L (ref 38–126)
Anion gap: 17 — ABNORMAL HIGH (ref 5–15)
BUN: 31 mg/dL — ABNORMAL HIGH (ref 8–23)
CO2: 17 mmol/L — ABNORMAL LOW (ref 22–32)
Calcium: 7.9 mg/dL — ABNORMAL LOW (ref 8.9–10.3)
Chloride: 106 mmol/L (ref 98–111)
Creatinine, Ser: 1.32 mg/dL — ABNORMAL HIGH (ref 0.44–1.00)
GFR, Estimated: 40 mL/min — ABNORMAL LOW (ref >60)
Glucose, Bld: 127 mg/dL — ABNORMAL HIGH (ref 70–99)
Potassium: 4.6 mmol/L (ref 3.5–5.1)
Sodium: 140 mmol/L (ref 135–145)
Total Bilirubin: 1.1 mg/dL (ref 0.3–1.2)
Total Protein: 6.4 g/dL — ABNORMAL LOW (ref 6.5–8.1)

## 2021-05-21 LAB — LACTIC ACID, PLASMA
Lactic Acid, Venous: >9 mmol/L (ref 0.5–1.9)
Lactic Acid, Venous: >9 mmol/L (ref 0.5–1.9)

## 2021-05-21 LAB — BRAIN NATRIURETIC PEPTIDE: B Natriuretic Peptide: 674.9 pg/mL — ABNORMAL HIGH (ref 0.0–100.0)

## 2021-05-21 LAB — APTT: aPTT: 31 seconds (ref 24–36)

## 2021-05-21 LAB — BLOOD GAS, ARTERIAL
Acid-Base Excess: 1.6 mmol/L (ref 0.0–2.0)
Acid-base deficit: 14.2 mmol/L — ABNORMAL HIGH (ref 0.0–2.0)
Bicarbonate: 15.1 mmol/L — ABNORMAL LOW (ref 20.0–28.0)
FIO2: 100 %
O2 Saturation: 100 %
Patient temperature: 37
pCO2 arterial: 52 mmHg — ABNORMAL HIGH (ref 32–48)
pH, Arterial: 7.07 — CL (ref 7.35–7.45)
pO2, Arterial: 383 mmHg — ABNORMAL HIGH (ref 83–108)

## 2021-05-21 LAB — LIPASE, BLOOD: Lipase: 34 U/L (ref 11–51)

## 2021-05-21 LAB — TROPONIN I (HIGH SENSITIVITY): Troponin I (High Sensitivity): 156 ng/L (ref <18)

## 2021-05-21 LAB — RESP PANEL BY RT-PCR (FLU A&B, COVID) ARPGX2
Influenza A by PCR: NEGATIVE
Influenza B by PCR: NEGATIVE
SARS Coronavirus 2 by RT PCR: NEGATIVE

## 2021-05-21 LAB — PROTIME-INR
INR: 1.5 — ABNORMAL HIGH (ref 0.8–1.2)
Prothrombin Time: 17.8 seconds — ABNORMAL HIGH (ref 11.4–15.2)

## 2021-05-21 LAB — MRSA NEXT GEN BY PCR, NASAL: MRSA by PCR Next Gen: NOT DETECTED

## 2021-05-21 MED ORDER — NOREPINEPHRINE 4 MG/250ML-% IV SOLN: 0.0000 ug/min | INTRAVENOUS | Status: DC

## 2021-05-21 MED ORDER — METHYLPREDNISOLONE SODIUM SUCC 125 MG IJ SOLR
125.0000 mg | Freq: Once | INTRAMUSCULAR | Status: AC
  Administered 2021-05-21: 125 mg via INTRAVENOUS
  Filled 2021-05-21: qty 2

## 2021-05-21 MED ORDER — VANCOMYCIN HCL IN DEXTROSE 1-5 GM/200ML-% IV SOLN
1000.0000 mg | Freq: Once | INTRAVENOUS | Status: AC
  Administered 2021-05-21: 1000 mg via INTRAVENOUS
  Filled 2021-05-21: qty 200

## 2021-05-21 MED ORDER — STERILE WATER FOR INJECTION IV SOLN
INTRAVENOUS | Status: DC
  Filled 2021-05-21: qty 150
  Filled 2021-05-21: qty 1000

## 2021-05-21 MED ORDER — LACTATED RINGERS IV SOLN: INTRAVENOUS | Status: DC

## 2021-05-21 MED ORDER — POLYETHYLENE GLYCOL 3350 17 G PO PACK: 17.0000 g | PACK | Freq: Every day | ORAL | Status: DC | PRN

## 2021-05-21 MED ORDER — DOCUSATE SODIUM 100 MG PO CAPS
100.0000 mg | ORAL_CAPSULE | Freq: Two times a day (BID) | ORAL | Status: DC | PRN
Start: 2021-05-21 — End: 2021-05-22

## 2021-05-21 MED ORDER — CHLORHEXIDINE GLUCONATE CLOTH 2 % EX PADS
6.0000 | MEDICATED_PAD | Freq: Every day | CUTANEOUS | Status: DC
  Administered 2021-05-21: 6 via TOPICAL

## 2021-05-21 MED ORDER — IOHEXOL 350 MG/ML SOLN
80.0000 mL | Freq: Once | INTRAVENOUS | Status: AC | PRN
  Administered 2021-05-21: 75 mL via INTRAVENOUS

## 2021-05-21 MED ORDER — HEPARIN SODIUM (PORCINE) 5000 UNIT/ML IJ SOLN: 5000.0000 [IU] | Freq: Three times a day (TID) | INTRAMUSCULAR | Status: DC

## 2021-05-21 MED ORDER — SODIUM CHLORIDE 0.9 % IV BOLUS
500.0000 mL | Freq: Once | INTRAVENOUS | Status: AC
Start: 2021-05-21 — End: 2021-05-21
  Administered 2021-05-21: 500 mL via INTRAVENOUS

## 2021-05-21 MED ORDER — SODIUM CHLORIDE 0.9 % IV SOLN: 2.0000 g | INTRAVENOUS | Status: DC

## 2021-05-21 MED ORDER — LACTATED RINGERS IV BOLUS (SEPSIS)
1000.0000 mL | Freq: Once | INTRAVENOUS | Status: AC
  Administered 2021-05-21: 1000 mL via INTRAVENOUS

## 2021-05-21 MED ORDER — MORPHINE SULFATE (PF) 2 MG/ML IV SOLN
2.0000 mg | INTRAVENOUS | Status: DC | PRN
  Administered 2021-05-21: 2 mg via INTRAVENOUS
  Filled 2021-05-21: qty 1

## 2021-05-21 MED ORDER — METRONIDAZOLE 500 MG/100ML IV SOLN
500.0000 mg | Freq: Once | INTRAVENOUS | Status: AC
  Administered 2021-05-21: 500 mg via INTRAVENOUS
  Filled 2021-05-21: qty 100

## 2021-05-21 MED ORDER — SODIUM BICARBONATE 8.4 % IV SOLN
50.0000 meq | Freq: Once | INTRAVENOUS | Status: AC
  Administered 2021-05-21: 50 meq via INTRAVENOUS
  Filled 2021-05-21: qty 50

## 2021-05-21 MED ORDER — SODIUM CHLORIDE 0.9 % IV SOLN
2.0000 g | Freq: Once | INTRAVENOUS | Status: AC
  Administered 2021-05-21: 2 g via INTRAVENOUS
  Filled 2021-05-21: qty 2

## 2021-05-21 MED ORDER — THIAMINE HCL 100 MG/ML IJ SOLN
500.0000 mg | INTRAVENOUS | Status: DC
  Administered 2021-05-21: 500 mg via INTRAVENOUS
  Filled 2021-05-21: qty 5

## 2021-05-21 MED ORDER — METRONIDAZOLE 500 MG/100ML IV SOLN: 500.0000 mg | Freq: Two times a day (BID) | INTRAVENOUS | Status: DC

## 2021-05-21 MED ORDER — SODIUM CHLORIDE 0.9 % IV SOLN
250.0000 mL | INTRAVENOUS | Status: DC
  Administered 2021-05-21: 250 mL via INTRAVENOUS

## 2021-05-21 MED ORDER — NOREPINEPHRINE 4 MG/250ML-% IV SOLN
2.0000 ug/min | INTRAVENOUS | Status: DC
  Filled 2021-05-21: qty 250

## 2021-05-21 NOTE — Progress Notes (Signed)
PCCM:  I met with family at bedside (Husband) and telephone conference with daughter and son.   Code status changed to DNR  Garner Nash, DO [facility] 05/19/2021 6:00 PM

## 2021-05-21 NOTE — Sepsis Progress Note (Addendum)
Notified bedside nurse of need to draw blood cultures and Repeat Lactic Acid.

## 2021-05-21 NOTE — ED Notes (Signed)
Report given to Salomon Fick, Therapist, sports.  All questions answered

## 2021-05-21 NOTE — ED Notes (Signed)
Family at bedside with patient.  Updated on plan of care

## 2021-05-21 NOTE — Progress Notes (Signed)
The patient's husband approached this nurse to request that this patient be moved to comfort care. This nurse will reach out to the provider to inform of this decision and continue with current plan of care until new orders are placed.

## 2021-05-21 NOTE — ED Notes (Signed)
Chaplain at bedside with pt and husband

## 2021-05-21 NOTE — Progress Notes (Addendum)
eLink Physician-Brief Progress Note Patient Name: [name] [name] DOB: 11/27/1934 MRN: [id number]   Date of Service  05/17/2021  HPI/Events of Note  Brief new admit notes: H and P, Notes and labs reviewed. Patient presents to the emergency department hypotensive with a severe lactic acidosis greater than 9, pH of 7.01 PCO2 of 56.  Patient had CTA imaging of the chest abdomen and pelvis which revealed no evidence of PE, there was left lower lobe abnormality concerning for possible recurrence of malignancy as well as significantly severely dilated colonic bowel concern for severe ileus or distal obstruction.  Patient was seen by general surgery in the emergency department.  87 year old female with a past medical history of stroke, coronary artery disease status post stent 2008, hypertension, cysts non-small cell lung cancer left lower lobe status post radiation 2019-2020 in remission followed by radiation oncology.  Recently fell on the right side sustained injuries to her right hip and right shoulder, proximal humeral fracture and right hip fracture.  She underwent reverse total shoulder arthroplasty and a right hip hemiarthroplasty was in the hospital on DVT prophylaxis with Lovenox and discharged to a rehabilitation   Data: reviewed 7.07/52/383 Camera evaluation done: On NRB mask. Getting sodium bicarb at 125 ml/hr. BNP 674, LA > 9, Covid/flu neg Cr 1.4 Hg > 11 No PE.  EKG: Sinus tachycardia, No STEMI. Non specific changes. Qtc   A/P:  Septic shock , severe lactemia Source: abdomen-ileus. AGMA. As per surgeon- psuedo obstruction- no surgery, not suspecting ischemic bowel.  - continue care, aspiration precautions. Follow surgery recommendations - keep K/Mag > 4 and 2 respectively - continue antibiotics.   AHHRF on NRB: Wean fio2 as tolerated to keep sats between 90 to 95% only. Follow ABG prn or in AM.   Encephalopathy AKI Elevated AST/ALT.  DNR   VTE: on Heparin SQ.  CBG goals  < 180.    eICU Interventions       Intervention Category Major Interventions: Sepsis - evaluation and management Evaluation Type: New Patient Evaluation  Elmer Sow 05/06/2021, 10:00 PM  22:42 Pt has just arrived to ICU w/ abdominal pain. Family is at the hospital now. She is a DNR. She is on a NRB with increased wob. Her pH is 7.03 and she is on a bicarb gtt. Nurse has orders for morphine prn q2hrs. Nurse is unable to obtain an O2 saturation. No one has discussed comfort care with the family. Nurse thought you might want to talk to the family.  Audio Video call made to bed side. Discussed with her husband ( wears hearing aid-hard of hearing) and son in law.  Continue DNR. Comfort care also discussed. They will think over it and let us know further plan of care.   00:20  Husband decided for comfort care. Comfort care ordered.

## 2021-05-21 NOTE — H&P (Signed)
NAME:  [name] [name], MRN:  [id number], DOB:  22-Jan-1935, LOS: 0 ADMISSION DATE:  05/29/2021, CONSULTATION DATE: 05/18/2021 REFERRING MD: Dr. Alvino Chapel, CHIEF COMPLAINT: Shock hypotension lactic acidosis  History of Present Illness:  This is an 87 year old female with a past medical history of stroke, coronary artery disease status post stent 2008, hypertension, cysts non-small cell lung cancer left lower lobe status post radiation 2019-2020 in remission followed by radiation oncology.  Recently fell on the right side sustained injuries to her right hip and right shoulder, proximal humeral fracture and right hip fracture.  She underwent reverse total shoulder arthroplasty and a right hip hemiarthroplasty was in the hospital on DVT prophylaxis with Lovenox and discharged to a rehabilitation facility.  Patient presents to the emergency department hypotensive with a severe lactic acidosis greater than 9, pH of 7.01 PCO2 of 56.  Patient had CTA imaging of the chest abdomen and pelvis which revealed no evidence of PE, there was left lower lobe abnormality concerning for possible recurrence of malignancy as well as significantly severely dilated colonic bowel concern for severe ileus or distal obstruction.  Patient was seen by general surgery in the emergency department.  Due to the patient's ongoing shock state multiorgan failure and respiratory failure pulmonary critical care was consulted for ICU admission  Pertinent  Medical History   Past Medical History:  Diagnosis Date   Anxiety    pt denies   Arthritis    back, legs   CAD (coronary artery disease) 2008   s/p Taxus Element Perseus Study stent (DES) to [place] in 08/2006; EF 65%   HTN (hypertension)    Hypercholesterolemia    Ischemic stroke ([place])    Mass of lower lobe of left lung 08/2017   Pneumonia 5/16   hospitalized for 4 days     Significant Hospital Events: Including procedures, antibiotic start and stop dates in addition to other  pertinent events     Interim History / Subjective:  Critically ill Per HPI above  Objective   Blood pressure (!) 75/52, pulse (!) 128, temperature 98.8 F (37.1 C), temperature source Axillary, resp. rate (!) 24, SpO2 100 %.        Intake/Output Summary (Last 24 hours) at 05/30/2021 1831 Last data filed at 05/17/2021 1715 Gross per 24 hour  Intake 99.24 ml  Output --  Net 99.24 ml   There were no vitals filed for this visit.  Examination: General: Elderly female, chronically ill-appearing resting in bed on nonrebreather HENT: Mucous membranes dry, eyes open to voice Lungs: Clear to auscultation bilaterally, diminished in the bases Cardiovascular: Tachycardic, regular Abdomen: Mildly distended, tender to palpation Extremities: Extremities are cool, Neuro: Opens eyes to voice, follows commands GU: Deferred  Resolved Hospital Problem list     Assessment & Plan:   Shock, hypotension Abdominal pain, colonic distention, possible severe ileus Severe lactic acidosis Acute hypoxemic, hypercapnic respiratory failure Acute metabolic encephalopathy secondary to above Recent traumatic injuries status post right shoulder and right hip repair Oliguria/AKI Leukocytosis Elevated AST/ALT  Plan: Long discussion with patient's husband, daughter and son.  They would like [place] to continue to do everything they could to support the patient except for her aerobic measures such as chest compressions, intubation, shocks. CODE STATUS was changed to DNR/DNI. I explained that we would give her coverage for antibiotics for sepsis as well as continued fluid resuscitation. We will give her a push of sodium bicarbonate plus of bicarbonate infusion. Continue cefepime and Flagyl We appreciate general surgery  evaluation and consultation.  If patient continues to decline will need to transition to comfort measures.   Best Practice (right click and "Reselect all SmartList Selections" daily)    Diet/type: NPO DVT prophylaxis: prophylactic heparin  GI prophylaxis: N/A Lines: N/A Foley:  N/A Code Status:  DNR Last date of multidisciplinary goals of care discussion [met with patient's husband, daughter and son.]  Labs   CBC: Recent Labs  Lab 05/15/21 1740 05/17/21 0334 05/18/21 0526 05/19/21 0348 05/31/2021 1351 05/14/2021 1523  WBC 17.3* 26.8* 16.3* 15.8* 31.0*  --   NEUTROABS  --   --  11.6* 11.3* 27.3*  --   HGB 9.7* 9.5* 8.6* 9.0* 10.7* 11.9*  HCT 31.6* 29.2* 27.7* 27.8* 35.4* 35.0*  MCV 96.0 90.7 94.2 90.8 97.3  --   PLT 189 223 228 278 394  --     Basic Metabolic Panel: Recent Labs  Lab 05/15/21 1740 06/02/2021 1351 05/12/2021 1523  NA  --  140 140  K  --  4.6 4.5  CL  --  106 105  CO2  --  17*  --   GLUCOSE  --  127* 121*  BUN  --  31* 32*  CREATININE 0.49 1.32* 1.40*  CALCIUM  --  7.9*  --    GFR: Estimated Creatinine Clearance: 22.7 mL/min (A) (by C-G formula based on SCr of 1.4 mg/dL (H)). Recent Labs  Lab 05/17/21 0334 05/18/21 0526 05/19/21 0348 05/27/2021 1351 05/07/2021 1551  WBC 26.8* 16.3* 15.8* 31.0*  --   LATICACIDVEN  --   --   --  >9.0* >9.0*    Liver Function Tests: Recent Labs  Lab 05/19/2021 1351  AST 136*  ALT 61*  ALKPHOS 108  BILITOT 1.1  PROT 6.4*  ALBUMIN 2.6*   Recent Labs  Lab 05/17/2021 1351  LIPASE 34   No results for input(s): AMMONIA in the last 168 hours.  ABG    Component Value Date/Time   PHART 7.07 (LL) 05/28/2021 1758   PCO2ART 52 (H) 06/02/2021 1758   PO2ART 383 (H) 05/07/2021 1758   HCO3 15.1 (L) 05/11/2021 1758   TCO2 21 (L) 05/10/2021 1523   ACIDBASEDEF 14.2 (H) 05/30/2021 1758   O2SAT 100 05/31/2021 1758     Coagulation Profile: Recent Labs  Lab 05/10/2021 1540  INR 1.5*    Cardiac Enzymes: No results for input(s): CKTOTAL, CKMB, CKMBINDEX, TROPONINI in the last 168 hours.  HbA1C: Hgb A1c MFr Bld  Date/Time Value Ref Range Status  06/18/2017 07:13 PM 5.4 4.8 - 5.6 % Final    Comment:     (NOTE) Pre diabetes:          5.7%-6.4% Diabetes:              >6.4% Glycemic control for   <7.0% adults with diabetes     CBG: No results for input(s): GLUCAP in the last 168 hours.  Review of Systems:   Unable to be fully obtained secondary to critical illness.  Past Medical History:  She,  has a past medical history of Anxiety, Arthritis, CAD (coronary artery disease) (2008), HTN (hypertension), Hypercholesterolemia, Ischemic stroke ([place]), Mass of lower lobe of left lung (08/2017), and Pneumonia (5/16).   Surgical History:   Past Surgical History:  Procedure Laterality Date   APPENDECTOMY  1950   BRONCHOSCOPY  08/03/2017   BIOPSY   CORONARY ANGIOPLASTY WITH STENT PLACEMENT     FUDUCIAL PLACEMENT Left 08/03/2017   Procedure: PLACEMENT OF FUDUCIAL;  Surgeon:  Collene Gobble, MD;  Location: [facility];  Service: Thoracic;  Laterality: Left;   GANGLION CYST EXCISION Right 12/26/2014   Procedure: EXCISON OF RIGHT FOOT GANGLION CYST;  Surgeon: Wylene Simmer, MD;  Location: [facility];  Service: Orthopedics;  Laterality: Right;   HIP ARTHROPLASTY Right 05/13/2021   Procedure: ARTHROPLASTY BIPOLAR HIP (HEMIARTHROPLASTY);  Surgeon: Willaim Sheng, MD;  Location: [facility];  Service: Orthopedics;  Laterality: Right;   IR RADIOLOGIST EVAL & MGMT  05/16/2018   lower back surgery  [place]   PCI of hte lesion in th marginal branch circumflex artery     REVERSE SHOULDER ARTHROPLASTY Right 05/15/2021   Procedure: REVERSE SHOULDER ARTHROPLASTY;  Surgeon: Hiram Gash, MD;  Location: [facility];  Service: Orthopedics;  Laterality: Right;   TEE WITHOUT CARDIOVERSION N/A 06/21/2017   Procedure: TRANSESOPHAGEAL ECHOCARDIOGRAM (TEE);  Surgeon: Acie Fredrickson Wonda Cheng, MD;  [facility];  Service: Cardiovascular;  Laterality: N/A;   VIDEO BRONCHOSCOPY WITH ENDOBRONCHIAL NAVIGATION N/A 08/03/2017   Procedure: VIDEO BRONCHOSCOPY WITH  ENDOBRONCHIAL NAVIGATION;  Surgeon: Collene Gobble, MD;  Location: [facility];  Service: Thoracic;  Laterality: N/A;     Social History:   reports that she quit smoking about 15 years ago. Her smoking use included cigarettes. She has a 40.00 pack-year smoking history. She has never used smokeless tobacco. She reports that she does not drink alcohol and does not use drugs.   Family History:  Her family history includes Heart failure in her mother; Hypertension in her mother.   Allergies Allergies  Allergen Reactions   Other Anaphylaxis and Swelling    Food or preservative allergy of unknown/undetermined origin (happened last in 2017 when the patient had gone out to eat and hadn't yet touched her steak??)     Home Medications  Prior to Admission medications   Medication Sig Start Date End Date Taking? Authorizing Provider  acetaminophen (TYLENOL) 500 MG tablet Take 1 tablet (500 mg total) by mouth every 4 (four) hours as needed for mild pain, moderate pain or fever. Patient taking differently: Take 500 mg by mouth every 4 (four) hours as needed (for pain). 05/19/21  Yes Shah, Pratik D, DO  Cholecalciferol (VITAMIN D3) 50 MCG (2000 UT) TABS Take 2,000 Units by mouth daily.   Yes [provider]  docusate sodium (COLACE) 100 MG capsule Take 1 capsule (100 mg total) by mouth 2 (two) times daily. 05/19/21  Yes Shah, Pratik D, DO  enoxaparin (LOVENOX) 40 MG/0.4ML injection Inject 0.4 mLs (40 mg total) into the skin daily. 05/20/21 06/19/21 Yes Shah, Pratik D, DO  EPINEPHrine 0.3 mg/0.3 mL IJ SOAJ injection Inject 0.3 mg into the muscle once as needed (for anaphylaxis).   Yes [provider]  felodipine (PLENDIL) 10 MG 24 hr tablet Take 10 mg by mouth daily.     Yes [provider]  HYDROcodone-acetaminophen (NORCO/VICODIN) 5-325 MG tablet Take 1 tablet by mouth every 6 (six) hours as needed for severe pain or moderate pain. 05/19/21  Yes Shah, Pratik D, DO  Multiple Vitamin  (MULTIVITAMIN WITH MINERALS) TABS tablet Take 1 tablet by mouth daily. Patient taking differently: Take 1 tablet by mouth daily with breakfast. 05/11/17  Yes Georgette Shell, MD  nitroGLYCERIN (NITROSTAT) 0.4 MG SL tablet Place 1 tablet (0.4 mg total) under the tongue every 5 (five) minutes as needed for chest pain. Patient taking differently: Place 0.4 mg  under the tongue every 5 (five) minutes x 3 doses as needed for chest pain. 07/10/14  Yes Josue Hector, MD  Nutritional Supplements (BOOST NUTRITIONAL ENERGY PO) Take 237 mLs by mouth 2 (two) times daily.   Yes [provider]  ondansetron (ZOFRAN) 4 MG tablet Take 4 mg by mouth every 4 (four) hours as needed for nausea or vomiting.   Yes [provider]  OXYGEN Inhale 1-3 L/min into the lungs See admin instructions. 1-3 L/min FOR SATS <90% FOR RESPIRATORY FAILURE 05/19/21  Yes [provider]  polyethylene glycol (MIRALAX / GLYCOLAX) 17 g packet Take 17 g by mouth daily as needed for mild constipation. Patient taking differently: Take 17 g by mouth in the morning. 05/19/21  Yes Shah, Pratik D, DO  PRESCRIPTION MEDICATION Inject 50 mL/hr into the skin See admin instructions. Sodium Chloride Solution 0.9%- "Inject 50 ml/hr into the skin every 8 hours for hydration/DC after 2 liters"   Yes [provider]  rosuvastatin (CRESTOR) 20 MG tablet Take 1 tablet (20 mg total) by mouth daily. Patient taking differently: Take 20 mg by mouth at bedtime. 05/20/21 06/19/21 Yes Shah, Pratik D, DO  traMADol (ULTRAM) 50 MG tablet Take 1 tablet (50 mg total) by mouth every 6 (six) hours as needed for moderate pain or severe pain. 05/19/21  Yes Manuella Ghazi, Pratik D, DO  CITRACAL MAXIMUM 315-6.25 MG-MCG TABS Take 1 tablet by mouth See admin instructions. Take 1 tablet by mouth in the morning on Tuesdays and Fridays only Patient not taking: Reported on 05/27/2021    [provider]  feeding supplement (ENSURE ENLIVE / ENSURE PLUS)  LIQD Take 237 mLs by mouth 2 (two) times daily between meals. Patient not taking: Reported on 05/25/2021 05/19/21   Heath Lark D, DO    This patient is critically ill with multiple organ system failure; which, requires frequent high complexity decision making, assessment, support, evaluation, and titration of therapies. This was completed through the application of advanced monitoring technologies and extensive interpretation of multiple databases. During this encounter critical care time was devoted to patient care services described in this note for 85 minutes.  Garner Nash, DO [facility] 05/11/2021 6:48 PM

## 2021-05-21 NOTE — Consult Note (Signed)
Reason for Consult:decreased mental status, n/v Referring Physician: Dr Reva Bores is an 87 y.o. female.  HPI: 16 yom with pmh of htn, stroke, lung cancer, cad with stent who recently had a hip and shoulder replacement after a fall.  She has been at sn[facility].  She presents with at least 24 hours of n/v, ? Ab pain.  She was noted to have lactate greater than 9. Ct scan which reviewed shows dilated colon and small bowel.  I was asked to see her.  Past Medical History:  Diagnosis Date   Anxiety    pt denies   Arthritis    back, legs   CAD (coronary artery disease) 2008   s/p Taxus Element Perseus Study stent (DES) to OM1 in 08/2006; EF 65%   HTN (hypertension)    Hypercholesterolemia    Ischemic stroke [facility])    Mass of lower lobe of left lung 08/2017   Pneumonia 5/16   hospitalized for 4 days    Past Surgical History:  Procedure Laterality Date   APPENDECTOMY  1950   BRONCHOSCOPY  08/03/2017   BIOPSY   CORONARY ANGIOPLASTY WITH STENT PLACEMENT     FUDUCIAL PLACEMENT Left 08/03/2017   Procedure: PLACEMENT OF FUDUCIAL;  Surgeon: Collene Gobble, MD;  Location: [facility] OR;  Service: Thoracic;  Laterality: Left;   GANGLION CYST EXCISION Right 12/26/2014   Procedure: EXCISON OF RIGHT FOOT GANGLION CYST;  Surgeon: Wylene Simmer, MD;  Location: [place];  Service: Orthopedics;  Laterality: Right;   HIP ARTHROPLASTY Right 05/13/2021   Procedure: ARTHROPLASTY BIPOLAR HIP (HEMIARTHROPLASTY);  Surgeon: Willaim Sheng, MD;  Location: [facility] ORS;  Service: Orthopedics;  Laterality: Right;   IR RADIOLOGIST EVAL & MGMT  05/16/2018   lower back surgery  [place]   PCI of hte lesion in th marginal branch circumflex artery     REVERSE SHOULDER ARTHROPLASTY Right 05/15/2021   Procedure: REVERSE SHOULDER ARTHROPLASTY;  Surgeon: Hiram Gash, MD;  Location: [facility];  Service: Orthopedics;  Laterality: Right;   TEE WITHOUT CARDIOVERSION N/A  06/21/2017   Procedure: TRANSESOPHAGEAL ECHOCARDIOGRAM (TEE);  Surgeon: Acie Fredrickson, Wonda Cheng, MD;  [facility] END[facility]COPY;  Service: Cardiovascular;  Laterality: N/A;   VIDEO BRONCHOSCOPY WITH ENDOBRONCHIAL NAVIGATION N/A 08/03/2017   Procedure: VIDEO BRONCHOSCOPY WITH ENDOBRONCHIAL NAVIGATION;  Surgeon: Collene Gobble, MD;  Location: [facility] OR;  Service: Thoracic;  Laterality: N/A;    Family History  Problem Relation Age of Onset   Heart failure Mother    Hypertension Mother     Social History:  reports that she quit smoking about 15 years ago. Her smoking use included cigarettes. She has a 40.00 pack-year smoking history. She has never used smokeless tobacco. She reports that she does not drink alcohol and does not use drugs.  Allergies:  Allergies  Allergen Reactions   Other Anaphylaxis and Swelling    Food or preservative allergy of unknown/undetermined origin (happened last in 2017 when the patient had gone out to eat and hadn't yet touched her steak??)    Medications: I have reviewed the patient's current medications. No current facility-administered medications on file prior to encounter.   Current Outpatient Medications on File Prior to Encounter  Medication Sig Dispense Refill   acetaminophen (TYLENOL) 500 MG tablet Take 1 tablet (500 mg total) by mouth every 4 (four) hours as needed for mild pain, moderate pain or fever. (Patient taking differently:  Take 500 mg by mouth every 4 (four) hours as needed (for pain).) 30 tablet 0   Cholecalciferol (VITAMIN D3) 50 MCG (2000 UT) TABS Take 2,000 Units by mouth daily.     docusate sodium (COLACE) 100 MG capsule Take 1 capsule (100 mg total) by mouth 2 (two) times daily. 10 capsule 0   enoxaparin (LOVENOX) 40 MG/0.4ML injection Inject 0.4 mLs (40 mg total) into the skin daily. 12 mL 0   EPINEPHrine 0.3 mg/0.3 mL IJ SOAJ injection Inject 0.3 mg into the muscle once as needed (for anaphylaxis).     felodipine (PLENDIL) 10 MG 24 hr tablet Take 10  mg by mouth daily.       HYDROcodone-acetaminophen (NORCO/VICODIN) 5-325 MG tablet Take 1 tablet by mouth every 6 (six) hours as needed for severe pain or moderate pain. 10 tablet 0   Multiple Vitamin (MULTIVITAMIN WITH MINERALS) TABS tablet Take 1 tablet by mouth daily. (Patient taking differently: Take 1 tablet by mouth daily with breakfast.)     nitroGLYCERIN (NITROSTAT) 0.4 MG SL tablet Place 1 tablet (0.4 mg total) under the tongue every 5 (five) minutes as needed for chest pain. (Patient taking differently: Place 0.4 mg under the tongue every 5 (five) minutes x 3 doses as needed for chest pain.) 25 tablet 3   Nutritional Supplements (BOOST NUTRITIONAL ENERGY PO) Take 237 mLs by mouth 2 (two) times daily.     ondansetron (ZOFRAN) 4 MG tablet Take 4 mg by mouth every 4 (four) hours as needed for nausea or vomiting.     OXYGEN Inhale 1-3 L/min into the lungs See admin instructions. 1-3 L/min FOR SATS <90% FOR RESPIRATORY FAILURE     polyethylene glycol (MIRALAX / GLYCOLAX) 17 g packet Take 17 g by mouth daily as needed for mild constipation. (Patient taking differently: Take 17 g by mouth in the morning.) 14 each 0   PRESCRIPTION MEDICATION Inject 50 mL/hr into the skin See admin instructions. Sodium Chloride Solution 0.9%- "Inject 50 ml/hr into the skin every 8 hours for hydration/DC after 2 liters"     rosuvastatin (CRESTOR) 20 MG tablet Take 1 tablet (20 mg total) by mouth daily. (Patient taking differently: Take 20 mg by mouth at bedtime.) 30 tablet 0   traMADol (ULTRAM) 50 MG tablet Take 1 tablet (50 mg total) by mouth every 6 (six) hours as needed for moderate pain or severe pain. 10 tablet 0   CITRACAL MAXIMUM 315-6.25 MG-MCG TABS Take 1 tablet by mouth See admin instructions. Take 1 tablet by mouth in the morning on Tuesdays and Fridays only (Patient not taking: Reported on 05/28/2021)     feeding supplement (ENSURE ENLIVE / ENSURE PLUS) LIQD Take 237 mLs by mouth 2 (two) times daily between  meals. (Patient not taking: Reported on 05/18/2021) 237 mL 12    Results for orders placed or performed during the hospital encounter of 05/07/2021 (from the past 48 hour(s))  Resp Panel by RT-PCR (Flu A&B, Covid) Nasopharyngeal Swab     Status: None   Collection Time: 05/25/2021  1:51 PM   Specimen: Nasopharyngeal Swab; Nasopharyngeal(NP) swabs in vial transport medium  Result Value Ref Range   SARS Coronavirus 2 by RT PCR NEGATIVE NEGATIVE    Comment: (NOTE) SARS-CoV-2 target nucleic acids are NOT DETECTED.  The SARS-CoV-2 RNA is generally detectable in upper respiratory specimens during the acute phase of infection. The lowest concentration of SARS-CoV-2 viral copies this assay can detect is 138 copies/mL. A negative result does  not preclude SARS-Cov-2 infection and should not be used as the sole basis for treatment or other patient management decisions. A negative result may occur with  improper specimen collection/handling, submission of specimen other than nasopharyngeal swab, presence of viral mutation(s) within the areas targeted by this assay, and inadequate number of viral copies(<138 copies/mL). A negative result must be combined with clinical observations, patient history, and epidemiological information. The expected result is Negative.  Fact Sheet for Patients:  EntrepreneurPulse.com.au  Fact Sheet for Healthcare Providers:  IncredibleEmployment.be  This test is no t yet approved or cleared by the [place] FDA and  has been authorized for detection and/or diagnosis of SARS-CoV-2 by FDA under an Emergency Use Authorization (EUA). This EUA will remain  in effect (meaning this test can be used) for the duration of the COVID-19 declaration under Section 564(b)(1) of the Act, 21 U.S.C.section 360bbb-3(b)(1), unless the authorization is terminated  or revoked sooner.       Influenza A by PCR NEGATIVE NEGATIVE   Influenza B by PCR  NEGATIVE NEGATIVE    Comment: (NOTE) The Xpert Xpress SARS-CoV-2/FLU/RSV plus assay is intended as an aid in the diagnosis of influenza from Nasopharyngeal swab specimens and should not be used as a sole basis for treatment. Nasal washings and aspirates are unacceptable for Xpert Xpress SARS-CoV-2/FLU/RSV testing.  Fact Sheet for Patients: EntrepreneurPulse.com.au  Fact Sheet for Healthcare Providers: IncredibleEmployment.be  This test is not yet approved or cleared by the [place] FDA and has been authorized for detection and/or diagnosis of SARS-CoV-2 by FDA under an Emergency Use Authorization (EUA). This EUA will remain in effect (meaning this test can be used) for the duration of the COVID-19 declaration under Section 564(b)(1) of the Act, 21 U.S.C. section 360bbb-3(b)(1), unless the authorization is terminated or revoked.  Performed at [facility], [place] [address] [place], [place] [address]   CBC with Differential     Status: Abnormal   Collection Time: 05/23/2021  1:51 PM  Result Value Ref Range   WBC 31.0 (H) 4.0 - 10.5 K/uL   RBC 3.64 (L) 3.87 - 5.11 MIL/uL   Hemoglobin 10.7 (L) 12.0 - 15.0 g/dL   HCT 35.4 (L) 36.0 - 46.0 %   MCV 97.3 80.0 - 100.0 fL   MCH 29.4 26.0 - 34.0 pg   MCHC 30.2 30.0 - 36.0 g/dL   RDW 13.9 11.5 - 15.5 %   Platelets 394 150 - 400 K/uL   nRBC 0.1 0.0 - 0.2 %   Neutrophils Relative % 85 %   Neutro Abs 27.3 (H) 1.7 - 7.7 K/uL   Band Neutrophils 3 %   Lymphocytes Relative 7 %   Lymphs Abs 2.2 0.7 - 4.0 K/uL   Monocytes Relative 5 %   Monocytes Absolute 1.6 (H) 0.1 - 1.0 K/uL   Eosinophils Relative 0 %   Eosinophils Absolute 0.0 0.0 - 0.5 K/uL   Basophils Relative 0 %   Basophils Absolute 0.0 0.0 - 0.1 K/uL   WBC Morphology MILD LEFT SHIFT (1-5% METAS, OCC MYELO, OCC BANDS)    Abs Immature Granulocytes 0.00 0.00 - 0.07 K/uL    Comment: Performed at [facility],  [place] [address], [place], [place] [address]  Comprehensive metabolic panel     Status: Abnormal   Collection Time: 05/14/2021  1:51 PM  Result Value Ref Range   Sodium 140 135 - 145 mmol/L   Potassium 4.6 3.5 - 5.1 mmol/L   Chloride  106 98 - 111 mmol/L   CO2 17 (L) 22 - 32 mmol/L   Glucose, Bld 127 (H) 70 - 99 mg/dL    Comment: Glucose reference range applies only to samples taken after fasting for at least 8 hours.   BUN 31 (H) 8 - 23 mg/dL   Creatinine, Ser 1.32 (H) 0.44 - 1.00 mg/dL   Calcium 7.9 (L) 8.9 - 10.3 mg/dL   Total Protein 6.4 (L) 6.5 - 8.1 g/dL   Albumin 2.6 (L) 3.5 - 5.0 g/dL   AST 136 (H) 15 - 41 U/L   ALT 61 (H) 0 - 44 U/L    Comment: RESULTS CONFIRMED BY MANUAL DILUTION   Alkaline Phosphatase 108 38 - 126 U/L   Total Bilirubin 1.1 0.3 - 1.2 mg/dL   GFR, Estimated 40 (L) >60 mL/min    Comment: (NOTE) Calculated using the CKD-EPI Creatinine Equation (2021)    Anion gap 17 (H) 5 - 15    Comment: Performed at [facility], [place] [address] [place], [place] [address]  Lactic acid, plasma     Status: Abnormal   Collection Time: 05/17/2021  1:51 PM  Result Value Ref Range   Lactic Acid, Venous >9.0 (HH) 0.5 - 1.9 mmol/L    Comment: CRITICAL RESULT CALLED TO, READ BACK BY AND VERIFIED WITH: LIVINGSTON,K. EMTP AT 1534 05/19/2021 MULLINS,T Performed at [facility], [place] [address], [place], [place] [address]   Lipase, blood     Status: None   Collection Time: 05/13/2021  1:51 PM  Result Value Ref Range   Lipase 34 11 - 51 U/L    Comment: Performed at [facility], [place] [address] [place], [place] [address]  Troponin I (High Sensitivity)     Status: Abnormal   Collection Time: 05/15/2021  1:51 PM  Result Value Ref Range   Troponin I (High Sensitivity) 156 (HH) <18 ng/L    Comment: CRITICAL RESULT CALLED TO, READ BACK BY AND VERIFIED WITH: LIVINGSTON,K. EMTP AT 3329 06/02/2021 MULLINS,T (NOTE) Elevated high sensitivity  troponin I (hsTnI) values and significant  changes across serial measurements may suggest ACS but many other  chronic and acute conditions are known to elevate hsTnI results.  Refer to the Links section for chest pain algorithms and additional  guidance. Performed at [facility], [place] [address] [place], [place] [address]   Brain natriuretic peptide     Status: Abnormal   Collection Time: 05/20/2021  1:52 PM  Result Value Ref Range   B Natriuretic Peptide 674.9 (H) 0.0 - 100.0 pg/mL    Comment: Performed at [facility], [place] [address] [place], [place] [address]  I-stat chem 8, ED (not at [facility] or [facility])     Status: Abnormal   Collection Time: 05/23/2021  3:23 PM  Result Value Ref Range   Sodium 140 135 - 145 mmol/L   Potassium 4.5 3.5 - 5.1 mmol/L   Chloride 105 98 - 111 mmol/L   BUN 32 (H) 8 - 23 mg/dL   Creatinine, Ser 1.40 (H) 0.44 - 1.00 mg/dL   Glucose, Bld 121 (H) 70 - 99 mg/dL    Comment: Glucose reference range applies only to samples taken after fasting for at least 8 hours.   Calcium, Ion 1.03 (L) 1.15 - 1.40 mmol/L   TCO2 21 (L) 22 - 32 mmol/L   Hemoglobin 11.9 (L) 12.0 - 15.0 g/dL   HCT 35.0 (L) 36.0 - 46.0 %  APTT     Status: None   Collection Time: 05/20/2021  3:40 PM  Result Value Ref Range   aPTT 31 24 - 36 seconds    Comment: Performed at [facility], [place] [address], [place], [place] [address]  Protime-INR     Status: Abnormal   Collection Time: 05/24/2021  3:40 PM  Result Value Ref Range   Prothrombin Time 17.8 (H) 11.4 - 15.2 seconds   INR 1.5 (H) 0.8 - 1.2    Comment: (NOTE) INR goal varies based on device and disease states. Performed at [facility], [place] [address], [place], [place] [address]   Lactic acid, plasma     Status: Abnormal   Collection Time: 05/09/2021  3:51 PM  Result Value Ref Range   Lactic Acid, Venous >9.0 (HH) 0.5 - 1.9 mmol/L    Comment: CRITICAL VALUE NOTED.   VALUE IS CONSISTENT WITH PREVIOUSLY REPORTED AND CALLED VALUE. Performed at [facility], [place] [address], [place], [place] [address]   Blood gas, arterial     Status: Abnormal   Collection Time: 05/12/2021  5:58 PM  Result Value Ref Range   FIO2 100.00 %   pH, Arterial 7.07 (LL) 7.35 - 7.45    Comment: CRITICAL RESULT CALLED TO, READ BACK BY AND VERIFIED WITH: EMTP LVINGSTON AT 1807 05/30/2021 CRUICKSHANK A    pCO2 arterial 52 (H) 32 - 48 mmHg   pO2, Arterial 383 (H) 83 - 108 mmHg   Bicarbonate 15.1 (L) 20.0 - 28.0 mmol/L   Acid-Base Excess 1.6 0.0 - 2.0 mmol/L   Acid-base deficit 14.2 (H) 0.0 - 2.0 mmol/L   O2 Saturation 100 %   Patient temperature 37.0    Collection site LEFT RADIAL    Allens test (pass/fail) PASS PASS    Comment: Performed at [facility], [place] [address] [place], [place] [address]    CT Angio Chest PE W and/or Wo Contrast  Result Date: 05/10/2021 CLINICAL DATA:  Abdominal pain, nausea. EXAM: CT ANGIOGRAPHY CHEST CT ABDOMEN AND PELVIS WITH CONTRAST TECHNIQUE: Multidetector CT imaging of the chest was performed using the standard protocol during bolus administration of intravenous contrast. Multiplanar CT image reconstructions and MIPs were obtained to evaluate the vascular anatomy. Multidetector CT imaging of the abdomen and pelvis was performed using the standard protocol during bolus administration of intravenous contrast. RADIATION DOSE REDUCTION: This exam was performed according to the departmental dose-optimization program which includes automated exposure control, adjustment of the mA and/or kV according to patient size and/or use of iterative reconstruction technique. CONTRAST:  66mL OMNIPAQUE IOHEXOL 350 MG/ML SOLN COMPARISON:  May 13, 2021.  June 19, 2017. FINDINGS: CTA CHEST FINDINGS Cardiovascular: Satisfactory opacification of the pulmonary arteries to the segmental level. No evidence of pulmonary embolism. Normal heart  size. No pericardial effusion. Atherosclerosis of thoracic aorta is noted without aneurysm or dissection. Coronary artery calcifications are noted. Mediastinum/Nodes: Thyroid gland is unremarkable. No adenopathy is noted. Dilated and fluid-filled esophagus is noted. Lungs/Pleura: No pneumothorax is noted. Small left pleural effusion is noted with adjacent subsegmental atelectasis. Stable irregular density is noted in the left lung base consistent with possible recurrent neoplasm as noted on prior CT scan. Emphysematous disease is noted bilaterally. Musculoskeletal: No chest wall abnormality. No acute or significant osseous findings. Review of the MIP images confirms the above findings. CT ABDOMEN and PELVIS FINDINGS Hepatobiliary: Hepatic steatosis. No cholelithiasis is noted. No biliary dilatation is noted. Pancreas: Unremarkable. No  pancreatic ductal dilatation or surrounding inflammatory changes. Spleen: Normal in size without focal abnormality. Adrenals/Urinary Tract: Adrenal glands appear normal. Left renal atrophy is noted. No hydronephrosis or renal obstruction is noted. Urinary bladder is unremarkable. Stomach/Bowel: Large and small bowel dilatation is noted concerning for ileus or possibly distal colonic obstruction. The stomach is unremarkable. Vascular/Lymphatic: Aortic atherosclerosis. No enlarged abdominal or pelvic lymph nodes. Reproductive: Status post hysterectomy. No adnexal masses. Other: No abdominal wall hernia or abnormality. No abdominopelvic ascites. Musculoskeletal: Status post right total hip arthroplasty. No acute osseous abnormality is noted. Review of the MIP images confirms the above findings. IMPRESSION: No definite evidence of pulmonary embolus. Small left pleural effusion is noted with adjacent subsegmental atelectasis. Stable irregular density is noted in left lower lobe concerning for recurrent neoplasm as noted on recent CT scan. PET CT may be performed for further evaluation.  Coronary artery calcifications are noted. Hepatic steatosis. Large and small bowel dilatation is noted concerning for diffuse ileus or possibly distal colonic obstruction. There is also noted dilated and fluid-filled esophagus of uncertain etiology. Aortic Atherosclerosis (ICD10-I70.0) and Emphysema (ICD10-J43.9). Electronically Signed   By: Marijo Conception M.D.   On: 05/17/2021 16:37   CT ABDOMEN PELVIS W CONTRAST  Result Date: 05/07/2021 CLINICAL DATA:  Abdominal pain, nausea. EXAM: CT ANGIOGRAPHY CHEST CT ABDOMEN AND PELVIS WITH CONTRAST TECHNIQUE: Multidetector CT imaging of the chest was performed using the standard protocol during bolus administration of intravenous contrast. Multiplanar CT image reconstructions and MIPs were obtained to evaluate the vascular anatomy. Multidetector CT imaging of the abdomen and pelvis was performed using the standard protocol during bolus administration of intravenous contrast. RADIATION DOSE REDUCTION: This exam was performed according to the departmental dose-optimization program which includes automated exposure control, adjustment of the mA and/or kV according to patient size and/or use of iterative reconstruction technique. CONTRAST:  63mL OMNIPAQUE IOHEXOL 350 MG/ML SOLN COMPARISON:  May 13, 2021.  June 19, 2017. FINDINGS: CTA CHEST FINDINGS Cardiovascular: Satisfactory opacification of the pulmonary arteries to the segmental level. No evidence of pulmonary embolism. Normal heart size. No pericardial effusion. Atherosclerosis of thoracic aorta is noted without aneurysm or dissection. Coronary artery calcifications are noted. Mediastinum/Nodes: Thyroid gland is unremarkable. No adenopathy is noted. Dilated and fluid-filled esophagus is noted. Lungs/Pleura: No pneumothorax is noted. Small left pleural effusion is noted with adjacent subsegmental atelectasis. Stable irregular density is noted in the left lung base consistent with possible recurrent neoplasm as  noted on prior CT scan. Emphysematous disease is noted bilaterally. Musculoskeletal: No chest wall abnormality. No acute or significant osseous findings. Review of the MIP images confirms the above findings. CT ABDOMEN and PELVIS FINDINGS Hepatobiliary: Hepatic steatosis. No cholelithiasis is noted. No biliary dilatation is noted. Pancreas: Unremarkable. No pancreatic ductal dilatation or surrounding inflammatory changes. Spleen: Normal in size without focal abnormality. Adrenals/Urinary Tract: Adrenal glands appear normal. Left renal atrophy is noted. No hydronephrosis or renal obstruction is noted. Urinary bladder is unremarkable. Stomach/Bowel: Large and small bowel dilatation is noted concerning for ileus or possibly distal colonic obstruction. The stomach is unremarkable. Vascular/Lymphatic: Aortic atherosclerosis. No enlarged abdominal or pelvic lymph nodes. Reproductive: Status post hysterectomy. No adnexal masses. Other: No abdominal wall hernia or abnormality. No abdominopelvic ascites. Musculoskeletal: Status post right total hip arthroplasty. No acute osseous abnormality is noted. Review of the MIP images confirms the above findings. IMPRESSION: No definite evidence of pulmonary embolus. Small left pleural effusion is noted with adjacent subsegmental atelectasis. Stable irregular density is noted  in left lower lobe concerning for recurrent neoplasm as noted on recent CT scan. PET CT may be performed for further evaluation. Coronary artery calcifications are noted. Hepatic steatosis. Large and small bowel dilatation is noted concerning for diffuse ileus or possibly distal colonic obstruction. There is also noted dilated and fluid-filled esophagus of uncertain etiology. Aortic Atherosclerosis (ICD10-I70.0) and Emphysema (ICD10-J43.9). Electronically Signed   By: Marijo Conception M.D.   On: 05/26/2021 16:37   DG Chest Portable 1 View  Result Date: 05/24/2021 CLINICAL DATA:  Recent surgery. Difficulty  breathing. Chest tightness tachycardia short of breath EXAM: PORTABLE CHEST 1 VIEW COMPARISON:  05/13/2021 FINDINGS: Elevated left hemidiaphragm unchanged. Left lower lobe linear density with associated surgical clips also unchanged. Heart size and vascularity normal. Atherosclerotic calcification aortic arch. No acute infiltrate or effusion. Right shoulder replacement IMPRESSION: Postsurgical changes left lung base with scarring and or atelectasis unchanged No superimposed acute abnormality.  No interval change. Electronically Signed   By: Franchot Gallo M.D.   On: 05/24/2021 14:07    Review of Systems  Unable to perform ROS: Acuity of condition  Blood pressure (!) 129/104, pulse (!) 126, temperature 98.8 F (37.1 C), temperature source Axillary, resp. rate (!) 38, SpO2 100 %. Physical Exam Constitutional:      General: She is in acute distress.     Appearance: She is ill-appearing.  Abdominal:     Comments: Moderately distended, mild tenderness, no peritoneal signs Rectal exam without mass     Assessment/Plan: Colonic ileus -Her exam and ct not really consistent with ischemic bowel right now although she is quite ill.  I cannot certainly say if this is primary process or secondary.  There isnt anything at least on ct scan that makes me think next step is surgery.  She doesn't appear to have distal obstruction-passing stool which I saw, nl rectal exam.  She likey has pseudoobstruction -I dont think surgery is next step for her -during my evaluation.she has been made dnr/dni by ccm. I think this is appropriate and her husband understands. -please call back if needed  I reviewed ED provider notes, last 24 h vitals and pain scores, last 24 h labs and trends, and last 24 h imaging results.  This care required moderate level of medical decision making.    Rolm Bookbinder 05/24/2021, 6:16 PM

## 2021-05-21 NOTE — Progress Notes (Signed)
Nurse requested chaplain. Husband was concerned that the patient was not doing very well. Patient had two surgery in two days after her fall in the bathroom. Husband mentioned that because of her stomach problem, she has not had nutrition necessary for recovery after the surgeries. Husband of the patient needed a spiritual support. Chaplain asked if the patient would like prayer and she said yes. We prayed there together for the patient's healing and the patient said Amen.   [place] Abbigayle Toole [phone]

## 2021-05-21 NOTE — ED Provider Notes (Signed)
°  Physical Exam  BP (!) 99/45    Pulse 95    Temp (!) 97.4 F (36.3 C) (Axillary)    Resp (!) 21    SpO2 (!) 74%   Physical Exam  Procedures  Procedures  ED Course / MDM    Medical Decision Making Amount and/or Complexity of Data Reviewed Labs: ordered. Radiology: ordered.  Risk Prescription drug management. Decision regarding hospitalization.   Received patient in signout.  Shortness of breath and abdominal pain.  Has had vomiting.  Tachycardic.  Developed hypotension.  Leukocytosis also showed up.  Code sepsis activated.  Patient only weighs around 50 kg and between the liter from EMS and the 500 cc bolus ordered by Dr. Gustavus Messing it would be 30/kg.  However with the hypotension and another liter of lactated Ringer's has been ordered.  Initial lactic acid came back greater than 9.  With the hypotension and leukocytosis sepsis was potentially identified and code sepsis started.  Turn with more hypotension and another fluid goals given.  Has had around 50/kg.  CT scans done and showed no PE.  Also likely ileus on CT scan.  Discussed with Dr. Valeta Harms from ICU who will admit.  Also discussed with Dr. Donne Elana who will see patient.  Admit to ICU.  No clear cause of infection.  Overall with lactate of 9 and remaining at greater than 9 after fluid resuscitation has a poor prognosis      Davonna Belling, MD Jun 14, 2021 0002

## 2021-05-21 NOTE — Progress Notes (Signed)
Pharmacy Antibiotic Note  [name] [name] is a 87 y.o. female admitted on 05/06/2021 with sepsis.  Pharmacy has been consulted for cefepime dosing.  Today, 05/06/2021 -SCr 1.4 is elevated from baseline. CrCl ~22 mL/min -WBC elevated -Lactate >9  Plan: Cefepime 2 g IV q24h Monitor renal function for necessary dose adjustments    Temp (24hrs), Avg:98.8 F (37.1 C), Min:98.8 F (37.1 C), Max:98.8 F (37.1 C)  Recent Labs  Lab 05/15/21 1740 05/17/21 0334 05/18/21 0526 05/19/21 0348 05/20/2021 1351 05/23/2021 1523 05/26/2021 1551  WBC 17.3* 26.8* 16.3* 15.8* 31.0*  --   --   CREATININE 0.49  --   --   --  1.32* 1.40*  --   LATICACIDVEN  --   --   --   --  >9.0*  --  >9.0*    Estimated Creatinine Clearance: 22.7 mL/min (A) (by C-G formula based on SCr of 1.4 mg/dL (H)).    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

## 2021-05-21 NOTE — ED Triage Notes (Signed)
Patient presents from [facility] with complaints of abdominal pain and nausea. Patient was found to have an ileus. She was given 1 L of fluid and Zofran. She has also had recent hip and shoulder surgery. While transporting the patient, EMS noticed she had shortness of breath and wheezing. EMS administered 10 of albuterol and some Atrovent.    HX: COPD, 3L O2 chronic  EMS vitals: 110/60 BP 124 HR 32 RR 97.8 Temp 96% SPO2 3L  207 CBG

## 2021-05-21 NOTE — Progress Notes (Signed)
A consult was received from an ED physician for vancomycin and cefepime per pharmacy dosing.  The patient's profile has been reviewed for ht/wt/allergies/indication/available labs.   A one time order has been placed for vancomycin 1000mg  IV x1 and cefepime 2g IV x1.  Further antibiotics/pharmacy consults should be ordered by admitting physician if indicated.                       Thank you,  Dimple Nanas, PharmD 05/23/2021 3:37 PM

## 2021-05-21 NOTE — ED Provider Notes (Signed)
[facility] DEPT Provider Note   CSN: [id number] Arrival date & time: 05/31/2021  1338     History  No chief complaint on file.   [name] [name] is a 87 y.o. female.  The history is provided by the patient, the EMS personnel and medical records. No language interpreter was used.  Shortness of Breath Associated symptoms: abdominal pain, chest pain and wheezing   Associated symptoms: no cough, no fever, no headaches and no neck pain   Abdominal Pain Pain location:  Generalized Pain quality: aching and bloating   Pain radiates to:  Does not radiate Pain severity:  Unable to specify Onset quality:  Unable to specify Timing:  Constant Relieved by:  Nothing Worsened by:  Palpation Ineffective treatments:  None tried Associated symptoms: chest pain, constipation, dysuria, nausea and shortness of breath   Associated symptoms: no chills, no cough, no diarrhea and no fever   Shortness of breath:    Severity:  Severe   Onset quality:  Gradual   Timing:  Constant   Progression:  Unchanged Risk factors: recent hospitalization       Home Medications Prior to Admission medications   Medication Sig Start Date End Date Taking? Authorizing Provider  acetaminophen (TYLENOL) 500 MG tablet Take 1 tablet (500 mg total) by mouth every 4 (four) hours as needed for mild pain, moderate pain or fever. 05/19/21   Manuella Ghazi, Pratik D, DO  Calcium Citrate (CITRACAL PO) Take 2 tablets by mouth 2 (two) times a week.    [provider]  Cholecalciferol (VITAMIN D3) 50 MCG (2000 UT) TABS Take 2,000 Units by mouth daily.    [provider]  docusate sodium (COLACE) 100 MG capsule Take 1 capsule (100 mg total) by mouth 2 (two) times daily. 05/19/21   Manuella Ghazi, Pratik D, DO  enoxaparin (LOVENOX) 40 MG/0.4ML injection Inject 0.4 mLs (40 mg total) into the skin daily. 05/20/21 06/19/21  Manuella Ghazi, Pratik D, DO  EPINEPHrine 0.3 mg/0.3 mL IJ SOAJ injection Inject 0.3 mg into the  muscle once as needed (for anaphylaxis).    [provider]  feeding supplement (ENSURE ENLIVE / ENSURE PLUS) LIQD Take 237 mLs by mouth 2 (two) times daily between meals. 05/19/21   Manuella Ghazi, Pratik D, DO  felodipine (PLENDIL) 10 MG 24 hr tablet Take 10 mg by mouth daily.      [provider]  HYDROcodone-acetaminophen (NORCO/VICODIN) 5-325 MG tablet Take 1 tablet by mouth every 6 (six) hours as needed for severe pain or moderate pain. 05/19/21   Manuella Ghazi, Pratik D, DO  Multiple Vitamin (MULTIVITAMIN WITH MINERALS) TABS tablet Take 1 tablet by mouth daily. Patient taking differently: Take 1 tablet by mouth daily with breakfast. 05/11/17   Georgette Shell, MD  nitroGLYCERIN (NITROSTAT) 0.4 MG SL tablet Place 1 tablet (0.4 mg total) under the tongue every 5 (five) minutes as needed for chest pain. 07/10/14   Josue Hector, MD  polyethylene glycol (MIRALAX / GLYCOLAX) 17 g packet Take 17 g by mouth daily as needed for mild constipation. 05/19/21   Manuella Ghazi, Pratik D, DO  rosuvastatin (CRESTOR) 20 MG tablet Take 1 tablet (20 mg total) by mouth daily. 05/20/21 06/19/21  Manuella Ghazi, Pratik D, DO  traMADol (ULTRAM) 50 MG tablet Take 1 tablet (50 mg total) by mouth every 6 (six) hours as needed for moderate pain or severe pain. 05/19/21   Heath Lark D, DO      Allergies    Other  Review of Systems   Review of Systems  Constitutional:  Negative for chills and fever.  HENT:  Negative for congestion.   Respiratory:  Positive for chest tightness, shortness of breath and wheezing. Negative for cough.   Cardiovascular:  Positive for chest pain. Negative for palpitations and leg swelling.  Gastrointestinal:  Positive for abdominal pain, constipation and nausea. Negative for diarrhea.  Genitourinary:  Positive for dysuria. Negative for flank pain.  Musculoskeletal:  Negative for back pain and neck pain.  Neurological:  Negative for headaches.  Psychiatric/Behavioral:  Negative for agitation and confusion.    All other systems reviewed and are negative.  Physical Exam Updated Vital Signs BP (!) 113/55 (BP Location: Right Arm)    Pulse (!) 126    Temp 98.8 F (37.1 C) (Axillary)    Resp (!) 120    SpO2 (!) 87%  Physical Exam Vitals and nursing note reviewed.  Constitutional:      General: She is not in acute distress.    Appearance: She is well-developed. She is ill-appearing. She is not toxic-appearing or diaphoretic.  HENT:     Head: Normocephalic and atraumatic.  Eyes:     Conjunctiva/sclera: Conjunctivae normal.     Pupils: Pupils are equal, round, and reactive to light.  Cardiovascular:     Rate and Rhythm: Regular rhythm. Tachycardia present.     Heart sounds: No murmur heard. Pulmonary:     Effort: Pulmonary effort is normal. Tachypnea present. No respiratory distress.     Breath sounds: No stridor. Decreased breath sounds and wheezing (initially then resolved) present. No rhonchi or rales.  Chest:     Chest wall: No tenderness.  Abdominal:     Palpations: Abdomen is soft.     Tenderness: There is abdominal tenderness.  Musculoskeletal:        General: No swelling.     Cervical back: Neck supple.     Right lower leg: No tenderness. No edema.     Left lower leg: No tenderness. No edema.  Skin:    General: Skin is warm and dry.     Capillary Refill: Capillary refill takes less than 2 seconds.     Findings: No erythema.  Neurological:     General: No focal deficit present.     Mental Status: She is alert.  Psychiatric:        Mood and Affect: Mood is anxious.    ED Results / Procedures / Treatments   Labs (all labs ordered are listed, but only abnormal results are displayed) Labs Reviewed  RESP PANEL BY RT-PCR (FLU A&B, COVID) ARPGX2  CBC WITH DIFFERENTIAL/PLATELET  COMPREHENSIVE METABOLIC PANEL  LACTIC ACID, PLASMA  LACTIC ACID, PLASMA  LIPASE, BLOOD  BRAIN NATRIURETIC PEPTIDE  URINALYSIS, ROUTINE W REFLEX MICROSCOPIC  I-STAT CHEM 8, ED  TROPONIN I (HIGH  SENSITIVITY)    EKG EKG Interpretation  Date/Time:  Thursday May 21 2021 14:32:10 EST Ventricular Rate:  138 PR Interval:  131 QRS Duration: 93 QT Interval:  271 QTC Calculation: 411 R Axis:   52 Text Interpretation: Sinus tachycardia Low voltage, extremity leads when compared to prior, faster rate. No STEMI Confirmed by Antony Blackbird [phone]) on 05/09/2021 2:42:05 PM  Radiology DG Chest Portable 1 View  Result Date: 05/19/2021 CLINICAL DATA:  Recent surgery. Difficulty breathing. Chest tightness tachycardia short of breath EXAM: PORTABLE CHEST 1 VIEW COMPARISON:  05/13/2021 FINDINGS: Elevated left hemidiaphragm unchanged. Left lower lobe linear density with associated surgical clips also unchanged. Heart  size and vascularity normal. Atherosclerotic calcification aortic arch. No acute infiltrate or effusion. Right shoulder replacement IMPRESSION: Postsurgical changes left lung base with scarring and or atelectasis unchanged No superimposed acute abnormality.  No interval change. Electronically Signed   By: Franchot Gallo M.D.   On: 05/10/2021 14:07    Procedures Procedures    CRITICAL CARE Performed by: Gwenyth Allegra Torrence Branagan Total critical care time: 35 minutes Critical care time was exclusive of separately billable procedures and treating other patients. Critical care was necessary to treat or prevent imminent or life-threatening deterioration. Critical care was time spent personally by me on the following activities: development of treatment plan with patient and/or surrogate as well as nursing, discussions with consultants, evaluation of patient's response to treatment, examination of patient, obtaining history from patient or surrogate, ordering and performing treatments and interventions, ordering and review of laboratory studies, ordering and review of radiographic studies, pulse oximetry and re-evaluation of patient's condition.   Medications Ordered in ED Medications   sodium chloride 0.9 % bolus 500 mL (has no administration in time range)  methylPREDNISolone sodium succinate (SOLU-MEDROL) 125 mg/2 mL injection 125 mg (125 mg Intravenous Given 05/08/2021 1455)    ED Course/ Medical Decision Making/ A&P                           Medical Decision Making Amount and/or Complexity of Data Reviewed Labs: ordered. Radiology: ordered.  Risk Prescription drug management.    [name] [name] is a 87 y.o. female with a past medical history significant for hypertension, previous stroke, lung cancer, CAD with PCI, and recent fall with hip replacement on 05/13/2021 and shoulder replacement on 05/15/2021 who presents with abdominal pain, nausea, x-ray facility showing ileus, increased work of breathing, tachycardia, shortness of breath.  According to EMS, patient had some nausea and about discomfort today leading to collapse rehab facility to an x-ray of the abdomen.  They found an ileus like bowel gas pattern and called for her to be transported.  She reportedly had increased work of breathing and tachycardia in transport.  Patient had some wheezing so they gave her a DuoNeb and increased her home 3 L to 8 L nonrebreather.  She is complaining of chest tightness and shortness of breath and still having some discomfort in her abdomen.  She denies current nausea or vomiting and says she is not a bowel movement recently.  On my initial evaluation, patient is having increased work of breathing and is on a nonrebreather.  Patient is tachycardic and intermittently having hypoxia in the 80s.  She is afebrile however and is not hypotensive.  On exam, lungs had some wheezing on the left and seem diminished on the right.  Abdomen was nontender but I could not appreciate bowel sounds initially.  She did well extremities in her hands and had intact pulses.  She had no specific tenderness in her hip or shoulder and reports no new pain anywhere she had surgery.  Given her recent surgery and  known cancer I am somewhat concerned that a new tachycardia, hypoxia, chest tightness, shortness of breath could be from a pulmonary embolism.  We will get CT PE study but will get chest x-ray initially.  With her abdominal pain and nausea with x-ray showing a possible obstructive bowel gas pattern for obstruction or ileus, will get CT abdomen pelvis to rule out obstruction.  Will likely give steroids for wheezing and increased work of breathing.  We  will recheck for COVID and get basic labs.  Anticipate admission for further management after work-up is completed.  My bedside review of the chest x-ray as it was being obtained did not show evidence of acute pneumothorax.  Patient will be transferred to a more private room for closer monitoring and a bed.  Anticipate reassessment after work-up is completed.  2:50 PM On reassessment, heart rate is still in the 130s to 140s but her breathing seems to be improving.  There is no further wheezing on my exam and there are no crackles.  We will give some more fluids.  Suspect the breathing treatment and steroids are helping the wheezing but I am still concerned about rule out PE and bowel obstruction.  We will try to de-escalate her oxygen requirement from a nonrebreather down to nasal cannula.  Care will be transferred to oncoming team to await her full work-up but anticipate admission for increased work of breathing.        Final Clinical Impression(s) / ED Diagnoses Final diagnoses:  Generalized abdominal pain  Shortness of breath  Tachycardia     Clinical Impression: 1. Generalized abdominal pain   2. Shortness of breath   3. Tachycardia     Disposition: Care transferred to oncoming team to await work-up to be completed prior to admission.  This note was prepared with assistance of Systems analyst. Occasional wrong-word or sound-a-like substitutions may have occurred due to the inherent limitations of voice recognition  software.     [name] [name], Gwenyth Allegra, MD 05/24/2021 1504

## 2021-05-21 NOTE — Sepsis Progress Note (Signed)
ELink tracking the Code Sepsis. 

## 2021-05-22 MED ORDER — MORPHINE 100MG IN NS 100ML (1MG/ML) PREMIX INFUSION
1.0000 mg/h | INTRAVENOUS | Status: DC
  Administered 2021-05-22: 1 mg/h via INTRAVENOUS
  Filled 2021-05-22: qty 100

## 2021-05-23 LAB — BLOOD CULTURE ID PANEL (REFLEXED) - BCID2
A.calcoaceticus-baumannii: NOT DETECTED
A.calcoaceticus-baumannii: NOT DETECTED
Bacteroides fragilis: DETECTED — AB
Bacteroides fragilis: NOT DETECTED
Candida albicans: NOT DETECTED
Candida albicans: NOT DETECTED
Candida auris: NOT DETECTED
Candida auris: NOT DETECTED
Candida glabrata: NOT DETECTED
Candida glabrata: NOT DETECTED
Candida krusei: NOT DETECTED
Candida krusei: NOT DETECTED
Candida parapsilosis: NOT DETECTED
Candida parapsilosis: NOT DETECTED
Candida tropicalis: NOT DETECTED
Candida tropicalis: NOT DETECTED
Cryptococcus neoformans/gattii: NOT DETECTED
Cryptococcus neoformans/gattii: NOT DETECTED
Enterobacter cloacae complex: NOT DETECTED
Enterobacter cloacae complex: NOT DETECTED
Enterobacterales: NOT DETECTED
Enterobacterales: NOT DETECTED
Enterococcus Faecium: NOT DETECTED
Enterococcus Faecium: NOT DETECTED
Enterococcus faecalis: NOT DETECTED
Enterococcus faecalis: NOT DETECTED
Escherichia coli: NOT DETECTED
Escherichia coli: NOT DETECTED
Haemophilus influenzae: NOT DETECTED
Haemophilus influenzae: NOT DETECTED
Klebsiella aerogenes: NOT DETECTED
Klebsiella aerogenes: NOT DETECTED
Klebsiella oxytoca: NOT DETECTED
Klebsiella oxytoca: NOT DETECTED
Klebsiella pneumoniae: NOT DETECTED
Klebsiella pneumoniae: NOT DETECTED
Listeria monocytogenes: NOT DETECTED
Listeria monocytogenes: NOT DETECTED
Neisseria meningitidis: NOT DETECTED
Neisseria meningitidis: NOT DETECTED
Proteus species: NOT DETECTED
Proteus species: NOT DETECTED
Pseudomonas aeruginosa: NOT DETECTED
Pseudomonas aeruginosa: NOT DETECTED
Salmonella species: NOT DETECTED
Salmonella species: NOT DETECTED
Serratia marcescens: NOT DETECTED
Serratia marcescens: NOT DETECTED
Staphylococcus aureus (BCID): NOT DETECTED
Staphylococcus aureus (BCID): NOT DETECTED
Staphylococcus epidermidis: NOT DETECTED
Staphylococcus epidermidis: NOT DETECTED
Staphylococcus lugdunensis: NOT DETECTED
Staphylococcus lugdunensis: NOT DETECTED
Staphylococcus species: NOT DETECTED
Staphylococcus species: NOT DETECTED
Stenotrophomonas maltophilia: NOT DETECTED
Stenotrophomonas maltophilia: NOT DETECTED
Streptococcus agalactiae: NOT DETECTED
Streptococcus agalactiae: NOT DETECTED
Streptococcus pneumoniae: NOT DETECTED
Streptococcus pneumoniae: NOT DETECTED
Streptococcus pyogenes: NOT DETECTED
Streptococcus pyogenes: NOT DETECTED
Streptococcus species: NOT DETECTED
Streptococcus species: NOT DETECTED

## 2021-05-27 LAB — CULTURE, BLOOD (ROUTINE X 2)
Special Requests: ADEQUATE
Special Requests: ADEQUATE

## 2021-06-03 NOTE — Death Summary Note (Signed)
DEATH SUMMARY   Patient Details  Name: [name] [name] MRN: [id number] DOB: 1935-01-19  Admission/Discharge Information   Admit Date:  Jun 09, 2021  Date of Death: Date of Death: 10-Jun-2021  Time of Death: Time of Death: 0134  Length of Stay: 1  Referring Physician: Leonard Downing, MD   Reason(s) for Hospitalization  This is an 87 year old female with a past medical history of stroke, coronary artery disease status post stent 2006/07/02, hypertension, cysts non-small cell lung cancer left lower lobe status post radiation 2019-2020 in remission followed by radiation oncology.  Recently fell on the right side sustained injuries to her right hip and right shoulder, proximal humeral fracture and right hip fracture.  She underwent reverse total shoulder arthroplasty and a right hip hemiarthroplasty was in the hospital on DVT prophylaxis with Lovenox and discharged to a rehabilitation facility.  Diagnoses  Preliminary cause of death: Lactic acidosis Secondary Diagnoses (including complications and co-morbidities):  Principal Problem:   Sepsis [facility])   Brief Hospital Course (including significant findings, care, treatment, and services provided and events leading to death)  [name] [name] is an 87 y.o. year old female who has a past medical history of stroke, coronary artery disease status post stent 2006/07/02, hypertension, cysts non-small cell lung cancer left lower lobe status post radiation 2019-2020 in remission followed by radiation oncology.  Recently fell on the right side sustained injuries to her right hip and right shoulder, proximal humeral fracture and right hip fracture.  She underwent reverse total shoulder arthroplasty and a right hip hemiarthroplasty was in the hospital on DVT prophylaxis with Lovenox and discharged to a rehabilitation facility.   Patient presents to the emergency department hypotensive with a severe lactic acidosis greater than 9, pH of 7.01 PCO2 of 56.  Patient had CTA  imaging of the chest abdomen and pelvis which revealed no evidence of PE, there was left lower lobe abnormality concerning for possible recurrence of malignancy as well as significantly severely dilated colonic bowel concern for severe ileus or distal obstruction.  Patient was seen by general surgery in the emergency department.  Due to the patient's ongoing shock state multiorgan failure and respiratory failure pulmonary critical care was consulted for ICU admission  Problems: Shock, hypotension Abdominal pain, colonic distention, possible severe ileus Severe lactic acidosis Acute hypoxemic, hypercapnic respiratory failure Acute metabolic encephalopathy secondary to above Recent traumatic injuries status post right shoulder and right hip repair Oliguria/AKI Leukocytosis Elevated AST/ALT  Unfortunately the patient continued to decline despite fluid resuscitation and antibiotics and bicarbonate infusion. Patient was transitioned to comfort care and passed in the hospital setting.    Pertinent Labs and Studies  Significant Diagnostic Studies DG Chest 1 View  Result Date: 05/13/2021 CLINICAL DATA:  Fall EXAM: CHEST  1 VIEW COMPARISON:  04/21/2018 FINDINGS: Postoperative changes at the left base. Left basilar scarring. Right lung clear. Heart is normal size. Aortic atherosclerosis. There is hyperinflation of the lungs compatible with COPD. IMPRESSION: COPD/chronic changes.  No active disease. Electronically Signed   By: Rolm Baptise M.D.   On: 05/13/2021 00:18   DG Shoulder Right  Result Date: 05/12/2021 CLINICAL DATA:  Tripped and fell, pain EXAM: RIGHT SHOULDER - 2+ VIEW COMPARISON:  None. FINDINGS: Frontal and transscapular views of the right shoulder are obtained. There is anterior dislocation of the glenohumeral joint. Abnormal angulation of the humeral neck consistent with an impacted fracture. No other acute bony abnormalities. Right chest is clear. IMPRESSION: 1. Impacted right humeral  neck fracture. 2. Anterior  glenohumeral dislocation. Electronically Signed   By: Randa Ngo M.D.   On: 05/12/2021 22:23   CT Head Wo Contrast  Result Date: 05/13/2021 CLINICAL DATA:  Head trauma, minor (Age >= 65y); Neck trauma (Age >= 65y). Fall. EXAM: CT HEAD WITHOUT CONTRAST CT CERVICAL SPINE WITHOUT CONTRAST TECHNIQUE: Multidetector CT imaging of the head and cervical spine was performed following the standard protocol without intravenous contrast. Multiplanar CT image reconstructions of the cervical spine were also generated. RADIATION DOSE REDUCTION: This exam was performed according to the departmental dose-optimization program which includes automated exposure control, adjustment of the mA and/or kV according to patient size and/or use of iterative reconstruction technique. COMPARISON:  None. FINDINGS: CT HEAD FINDINGS Brain: Normal anatomic configuration. Parenchymal volume loss is commensurate with the patient's age. Moderate periventricular white matter changes are present likely reflecting the sequela of small vessel ischemia. Remote lacunar infarct noted within the right basal ganglia and peripheral right cerebellar hemisphere. No abnormal intra or extra-axial mass lesion or fluid collection. No abnormal mass effect or midline shift. No evidence of acute intracranial hemorrhage or infarct. Ventricular size is normal. Cerebellum unremarkable. Vascular: No asymmetric hyperdense vasculature at the skull base. Skull: Intact Sinuses/Orbits: Paranasal sinuses are clear. Orbits are unremarkable. Other: Mastoid air cells and middle ear cavities are clear. CT CERVICAL SPINE FINDINGS Alignment: 2 mm anterolisthesis of C4-5 and C7-T1 and minimal retrolisthesis of C5-6 are likely degenerative in nature. Similarly, mild reversal of the normal cervical lordosis at C3-C6 is likely degenerative in nature. Skull base and vertebrae: Craniocervical alignment is normal. The atlantodental interval is not widened. The  osseous structures are diffusely osteopenic. No acute fracture of the cervical spine. Vertebral body height is preserved. There is ankylosis of the right C2-3 facet joint. Soft tissues and spinal canal: There is mild central canal stenosis at C5-6 due to a combination of minimal retrolisthesis as well as posterior disc osteophyte complex with abutment and minimal flattening of the thecal sac. Similar changes are noted at C6-7. Spinal canal is otherwise widely patent. No canal hematoma. The prevertebral soft tissues are not thickened and there is no paravertebral fluid collection identified. Extensive calcification within the left carotid bifurcation. No pathologic adenopathy. Disc levels: There is intervertebral disc space narrowing and endplate remodeling at Z0-C5 in keeping with changes of moderate to severe degenerative disc disease. Disc calcifications within the remaining intervertebral disc spaces are compatible with mild degenerative changes at these levels. Prevertebral soft tissues are not thickened. Review of the axial images demonstrates multilevel uncovertebral and facet arthrosis resulting in multilevel moderate to severe neuroforaminal narrowing, most severe on the right at C4-5 and C5-6 Upper chest: Advanced emphysema. Other: None IMPRESSION: No acute intracranial abnormality.  No calvarial fracture. No acute fracture or listhesis of the cervical spine. Emphysema (ICD10-J43.9). Electronically Signed   By: Fidela Salisbury M.D.   On: 05/13/2021 00:22   CT CHEST WO CONTRAST  Result Date: 05/13/2021 CLINICAL DATA:  87 year old female with history of non-small cell lung cancer. Patient diagnosed in 2019 status post radiation therapy to the left lung. History of trauma from a fall yesterday evening. Low oxygen saturations. EXAM: CT CHEST WITHOUT CONTRAST TECHNIQUE: Multidetector CT imaging of the chest was performed following the standard protocol without IV contrast. RADIATION DOSE REDUCTION: This exam  was performed according to the departmental dose-optimization program which includes automated exposure control, adjustment of the mA and/or kV according to patient size and/or use of iterative reconstruction technique. COMPARISON:  Chest CT 08/24/2019.  FINDINGS: Cardiovascular: Heart size is normal. There is no significant pericardial fluid, thickening or pericardial calcification. There is aortic atherosclerosis, as well as atherosclerosis of the great vessels of the mediastinum and the coronary arteries, including calcified atherosclerotic plaque in the left main, left anterior descending, left circumflex and right coronary arteries. Mediastinum/Nodes: No pathologically enlarged mediastinal or hilar lymph nodes. Please note that accurate exclusion of hilar adenopathy is limited on noncontrast CT scans. Esophagus is unremarkable in appearance. No axillary lymphadenopathy. Lungs/Pleura: No pneumothorax. No acute consolidative airspace disease. No pleural effusions. Fiducial markers in the left lower lobe are again noted, surrounded by a mass-like area of architectural distortion which is increased in size compared to prior examinations (axial image 83 of series 3 and coronal image 61 of series 4) measuring 3.5 x 2.2 x 1.9 cm on today's study, concerning for local recurrence of neoplasm. Small right upper lobe calcified granuloma. No other suspicious appearing pulmonary nodules or masses are noted. Diffuse bronchial wall thickening with mild to moderate centrilobular and paraseptal emphysema. Upper Abdomen: Atherosclerotic calcifications in the abdominal aorta. Small calcified granuloma incidentally noted in the liver. Musculoskeletal: Acute impacted right humeral neck fracture. There are no acute displaced fractures or aggressive appearing lytic or blastic lesions noted in the visualized portions of the skeleton. Several chronic appearing compression fractures are noted in the thoracic and lumbar spine at T2, T3,  T6, T9, T10, T11, T12 and L1, most severe at L1 where there is up to 70% loss of central vertebral body height. IMPRESSION: 1. Acute impacted right humeral neck fracture partially imaged. 2. No other evidence of significant acute traumatic injury to the thorax. 3. Progressive mass-like enlargement of treated left lower lobe neoplasm which currently measures 3.5 x 2.2 x 1.9 cm, highly concerning for locally recurrent disease. Further evaluation with nonemergent PET-CT is recommended in the near future to better evaluate this finding. 4. Mild diffuse bronchial wall thickening with mild to moderate centrilobular and paraseptal emphysema; imaging findings suggestive of underlying COPD. 5. Aortic atherosclerosis, in addition to left main and three-vessel coronary artery disease. Aortic Atherosclerosis (ICD10-I70.0) and Emphysema (ICD10-J43.9). Electronically Signed   By: Vinnie Langton M.D.   On: 05/13/2021 06:03   CT Angio Chest PE W and/or Wo Contrast  Result Date: 05/20/2021 CLINICAL DATA:  Abdominal pain, nausea. EXAM: CT ANGIOGRAPHY CHEST CT ABDOMEN AND PELVIS WITH CONTRAST TECHNIQUE: Multidetector CT imaging of the chest was performed using the standard protocol during bolus administration of intravenous contrast. Multiplanar CT image reconstructions and MIPs were obtained to evaluate the vascular anatomy. Multidetector CT imaging of the abdomen and pelvis was performed using the standard protocol during bolus administration of intravenous contrast. RADIATION DOSE REDUCTION: This exam was performed according to the departmental dose-optimization program which includes automated exposure control, adjustment of the mA and/or kV according to patient size and/or use of iterative reconstruction technique. CONTRAST:  72mL OMNIPAQUE IOHEXOL 350 MG/ML SOLN COMPARISON:  May 13, 2021.  June 19, 2017. FINDINGS: CTA CHEST FINDINGS Cardiovascular: Satisfactory opacification of the pulmonary arteries to the segmental  level. No evidence of pulmonary embolism. Normal heart size. No pericardial effusion. Atherosclerosis of thoracic aorta is noted without aneurysm or dissection. Coronary artery calcifications are noted. Mediastinum/Nodes: Thyroid gland is unremarkable. No adenopathy is noted. Dilated and fluid-filled esophagus is noted. Lungs/Pleura: No pneumothorax is noted. Small left pleural effusion is noted with adjacent subsegmental atelectasis. Stable irregular density is noted in the left lung base consistent with possible recurrent neoplasm as noted  on prior CT scan. Emphysematous disease is noted bilaterally. Musculoskeletal: No chest wall abnormality. No acute or significant osseous findings. Review of the MIP images confirms the above findings. CT ABDOMEN and PELVIS FINDINGS Hepatobiliary: Hepatic steatosis. No cholelithiasis is noted. No biliary dilatation is noted. Pancreas: Unremarkable. No pancreatic ductal dilatation or surrounding inflammatory changes. Spleen: Normal in size without focal abnormality. Adrenals/Urinary Tract: Adrenal glands appear normal. Left renal atrophy is noted. No hydronephrosis or renal obstruction is noted. Urinary bladder is unremarkable. Stomach/Bowel: Large and small bowel dilatation is noted concerning for ileus or possibly distal colonic obstruction. The stomach is unremarkable. Vascular/Lymphatic: Aortic atherosclerosis. No enlarged abdominal or pelvic lymph nodes. Reproductive: Status post hysterectomy. No adnexal masses. Other: No abdominal wall hernia or abnormality. No abdominopelvic ascites. Musculoskeletal: Status post right total hip arthroplasty. No acute osseous abnormality is noted. Review of the MIP images confirms the above findings. IMPRESSION: No definite evidence of pulmonary embolus. Small left pleural effusion is noted with adjacent subsegmental atelectasis. Stable irregular density is noted in left lower lobe concerning for recurrent neoplasm as noted on recent CT  scan. PET CT may be performed for further evaluation. Coronary artery calcifications are noted. Hepatic steatosis. Large and small bowel dilatation is noted concerning for diffuse ileus or possibly distal colonic obstruction. There is also noted dilated and fluid-filled esophagus of uncertain etiology. Aortic Atherosclerosis (ICD10-I70.0) and Emphysema (ICD10-J43.9). Electronically Signed   By: Marijo Conception M.D.   On: 05/31/2021 16:37   CT Cervical Spine Wo Contrast  Result Date: 05/13/2021 CLINICAL DATA:  Head trauma, minor (Age >= 65y); Neck trauma (Age >= 65y). Fall. EXAM: CT HEAD WITHOUT CONTRAST CT CERVICAL SPINE WITHOUT CONTRAST TECHNIQUE: Multidetector CT imaging of the head and cervical spine was performed following the standard protocol without intravenous contrast. Multiplanar CT image reconstructions of the cervical spine were also generated. RADIATION DOSE REDUCTION: This exam was performed according to the departmental dose-optimization program which includes automated exposure control, adjustment of the mA and/or kV according to patient size and/or use of iterative reconstruction technique. COMPARISON:  None. FINDINGS: CT HEAD FINDINGS Brain: Normal anatomic configuration. Parenchymal volume loss is commensurate with the patient's age. Moderate periventricular white matter changes are present likely reflecting the sequela of small vessel ischemia. Remote lacunar infarct noted within the right basal ganglia and peripheral right cerebellar hemisphere. No abnormal intra or extra-axial mass lesion or fluid collection. No abnormal mass effect or midline shift. No evidence of acute intracranial hemorrhage or infarct. Ventricular size is normal. Cerebellum unremarkable. Vascular: No asymmetric hyperdense vasculature at the skull base. Skull: Intact Sinuses/Orbits: Paranasal sinuses are clear. Orbits are unremarkable. Other: Mastoid air cells and middle ear cavities are clear. CT CERVICAL SPINE FINDINGS  Alignment: 2 mm anterolisthesis of C4-5 and C7-T1 and minimal retrolisthesis of C5-6 are likely degenerative in nature. Similarly, mild reversal of the normal cervical lordosis at C3-C6 is likely degenerative in nature. Skull base and vertebrae: Craniocervical alignment is normal. The atlantodental interval is not widened. The osseous structures are diffusely osteopenic. No acute fracture of the cervical spine. Vertebral body height is preserved. There is ankylosis of the right C2-3 facet joint. Soft tissues and spinal canal: There is mild central canal stenosis at C5-6 due to a combination of minimal retrolisthesis as well as posterior disc osteophyte complex with abutment and minimal flattening of the thecal sac. Similar changes are noted at C6-7. Spinal canal is otherwise widely patent. No canal hematoma. The prevertebral soft tissues are not thickened  and there is no paravertebral fluid collection identified. Extensive calcification within the left carotid bifurcation. No pathologic adenopathy. Disc levels: There is intervertebral disc space narrowing and endplate remodeling at D9-M4 in keeping with changes of moderate to severe degenerative disc disease. Disc calcifications within the remaining intervertebral disc spaces are compatible with mild degenerative changes at these levels. Prevertebral soft tissues are not thickened. Review of the axial images demonstrates multilevel uncovertebral and facet arthrosis resulting in multilevel moderate to severe neuroforaminal narrowing, most severe on the right at C4-5 and C5-6 Upper chest: Advanced emphysema. Other: None IMPRESSION: No acute intracranial abnormality.  No calvarial fracture. No acute fracture or listhesis of the cervical spine. Emphysema (ICD10-J43.9). Electronically Signed   By: Fidela Salisbury M.D.   On: 05/13/2021 00:22   CT ABDOMEN PELVIS W CONTRAST  Result Date: 05/23/2021 CLINICAL DATA:  Abdominal pain, nausea. EXAM: CT ANGIOGRAPHY CHEST CT  ABDOMEN AND PELVIS WITH CONTRAST TECHNIQUE: Multidetector CT imaging of the chest was performed using the standard protocol during bolus administration of intravenous contrast. Multiplanar CT image reconstructions and MIPs were obtained to evaluate the vascular anatomy. Multidetector CT imaging of the abdomen and pelvis was performed using the standard protocol during bolus administration of intravenous contrast. RADIATION DOSE REDUCTION: This exam was performed according to the departmental dose-optimization program which includes automated exposure control, adjustment of the mA and/or kV according to patient size and/or use of iterative reconstruction technique. CONTRAST:  43mL OMNIPAQUE IOHEXOL 350 MG/ML SOLN COMPARISON:  May 13, 2021.  June 19, 2017. FINDINGS: CTA CHEST FINDINGS Cardiovascular: Satisfactory opacification of the pulmonary arteries to the segmental level. No evidence of pulmonary embolism. Normal heart size. No pericardial effusion. Atherosclerosis of thoracic aorta is noted without aneurysm or dissection. Coronary artery calcifications are noted. Mediastinum/Nodes: Thyroid gland is unremarkable. No adenopathy is noted. Dilated and fluid-filled esophagus is noted. Lungs/Pleura: No pneumothorax is noted. Small left pleural effusion is noted with adjacent subsegmental atelectasis. Stable irregular density is noted in the left lung base consistent with possible recurrent neoplasm as noted on prior CT scan. Emphysematous disease is noted bilaterally. Musculoskeletal: No chest wall abnormality. No acute or significant osseous findings. Review of the MIP images confirms the above findings. CT ABDOMEN and PELVIS FINDINGS Hepatobiliary: Hepatic steatosis. No cholelithiasis is noted. No biliary dilatation is noted. Pancreas: Unremarkable. No pancreatic ductal dilatation or surrounding inflammatory changes. Spleen: Normal in size without focal abnormality. Adrenals/Urinary Tract: Adrenal glands appear  normal. Left renal atrophy is noted. No hydronephrosis or renal obstruction is noted. Urinary bladder is unremarkable. Stomach/Bowel: Large and small bowel dilatation is noted concerning for ileus or possibly distal colonic obstruction. The stomach is unremarkable. Vascular/Lymphatic: Aortic atherosclerosis. No enlarged abdominal or pelvic lymph nodes. Reproductive: Status post hysterectomy. No adnexal masses. Other: No abdominal wall hernia or abnormality. No abdominopelvic ascites. Musculoskeletal: Status post right total hip arthroplasty. No acute osseous abnormality is noted. Review of the MIP images confirms the above findings. IMPRESSION: No definite evidence of pulmonary embolus. Small left pleural effusion is noted with adjacent subsegmental atelectasis. Stable irregular density is noted in left lower lobe concerning for recurrent neoplasm as noted on recent CT scan. PET CT may be performed for further evaluation. Coronary artery calcifications are noted. Hepatic steatosis. Large and small bowel dilatation is noted concerning for diffuse ileus or possibly distal colonic obstruction. There is also noted dilated and fluid-filled esophagus of uncertain etiology. Aortic Atherosclerosis (ICD10-I70.0) and Emphysema (ICD10-J43.9). Electronically Signed   By: Marijo Conception  M.D.   On: 05/23/2021 16:37   CT Shoulder Right Wo Contrast  Result Date: 05/13/2021 CLINICAL DATA:  Right shoulder fracture dislocation EXAM: CT OF THE UPPER RIGHT EXTREMITY WITHOUT CONTRAST TECHNIQUE: Multidetector CT imaging of the upper right extremity was performed according to the standard protocol. RADIATION DOSE REDUCTION: This exam was performed according to the departmental dose-optimization program which includes automated exposure control, adjustment of the mA and/or kV according to patient size and/or use of iterative reconstruction technique. COMPARISON:  None. FINDINGS: Bones/Joint/Cartilage The osseous structures are diffusely  osteopenic. There is an acute impacted fracture of the a right humeral head with fracture planes involving the greater tuberosity and surgical neck of the humerus. The humeral shaft appears impacted within the humeral head by approximately 2 cm. Greater tuberosity fracture fragment appears minimally displaced. Fracture fragments are in grossly anatomic alignment. There is anteroinferior subluxation of the humeral head in relation of the glenoid fossa likely representing pseudosubluxation in the presence of a moderate right shoulder effusion. Superimposed degenerative changes are noted involving the glenohumeral articulation with chondrocalcinosis of the labrum and subchondral cyst formation within the glenoid. The visualized clavicle and the scapula appear intact. Visualized right thoracic cage appears intact. Ligaments Suboptimally assessed by CT. Muscles and Tendons Integrity of the rotator cuff is not confirmed on this examination. Pectoralis minor and short head biceps insertions upon the coracoid appear intact. There is fatty atrophy of the supraspinatus. Otherwise normal muscle bulk. Soft tissues Emphysema.  Moderate right shoulder effusion. IMPRESSION: Impacted three-part fracture of the right humeral head involving the greater tuberosity and surgical neck of the humerus. Grossly anatomic alignment of fracture fragments. Anteroinferior glenohumeral subluxation, possibly related to moderate right shoulder effusion. Superimposed glenohumeral degenerative arthritis. Fatty atrophy of the supraspinatus. Integrity of the rotator cuff is not well assessed on this exam. Emphysema (ICD10-J43.9). Electronically Signed   By: Fidela Salisbury M.D.   On: 05/13/2021 00:32   DG Chest Portable 1 View  Result Date: 05/29/2021 CLINICAL DATA:  Recent surgery. Difficulty breathing. Chest tightness tachycardia short of breath EXAM: PORTABLE CHEST 1 VIEW COMPARISON:  05/13/2021 FINDINGS: Elevated left hemidiaphragm unchanged.  Left lower lobe linear density with associated surgical clips also unchanged. Heart size and vascularity normal. Atherosclerotic calcification aortic arch. No acute infiltrate or effusion. Right shoulder replacement IMPRESSION: Postsurgical changes left lung base with scarring and or atelectasis unchanged No superimposed acute abnormality.  No interval change. Electronically Signed   By: Franchot Gallo M.D.   On: 05/15/2021 14:07   DG Shoulder Right Port  Result Date: 05/15/2021 CLINICAL DATA:  Right shoulder replacement EXAM: RIGHT SHOULDER - 1 VIEW COMPARISON:  Right shoulder CT 05/13/2021 FINDINGS: Postsurgical changes of reverse right total shoulder arthroplasty. Intact hardware without evidence of loosening or periprosthetic fracture. Expected soft tissue changes. IMPRESSION: Postsurgical changes reverse right total shoulder arthroplasty. No evidence of immediate hardware complication. Electronically Signed   By: Maurine Simmering M.D.   On: 05/15/2021 16:07   DG HIP OPERATIVE UNILAT W OR W/O PELVIS RIGHT  Result Date: 05/13/2021 CLINICAL DATA:  Fracture right hip EXAM: OPERATIVE right HIP (WITH PELVIS IF PERFORMED)  VIEWS TECHNIQUE: Fluoroscopic spot image(s) were submitted for interpretation post-operatively. COMPARISON:  05/12/2021 FINDINGS: There is interval right hip arthroplasty. No fracture is seen. There is no dislocation. Arterial calcifications are seen in the soft tissues. IMPRESSION: Interval right hip arthroplasty. Electronically Signed   By: Elmer Picker M.D.   On: 05/13/2021 17:49   DG Hip Unilat  With  Pelvis 2-3 Views Right  Result Date: 05/12/2021 CLINICAL DATA:  Tripped and fell, right hip pain EXAM: DG HIP (WITH OR WITHOUT PELVIS) 2-3V RIGHT COMPARISON:  None. FINDINGS: Frontal view of the pelvis as well as frontal and cross-table lateral views of the right hip are obtained. There is an impacted subcapital right femoral neck fracture with varus angulation at the fracture site. No  dislocation. Symmetrical bilateral hip joint space narrowing. Diffuse vascular calcifications. IMPRESSION: 1. Impacted subcapital right femoral neck fracture with varus angulation at the fracture site. Electronically Signed   By: Randa Ngo M.D.   On: 05/12/2021 22:24   ECHOCARDIOGRAM LIMITED  Result Date: 05/13/2021    ECHOCARDIOGRAM REPORT   Patient Name:   ABREANNA DRAWDY Date of Exam: 05/13/2021 Medical Rec #:  [id number]    Height:       62.0 in Accession #:    [id number]   Weight:       106.0 lb Date of Birth:  01/24/1935    BSA:          1.460 m Patient Age:    32 years     BP:           141/81 mmHg Patient Gender: F            HR:           104 bpm. Exam Location:  Inpatient Procedure: 2D Echo Indications:    Abnormal ECG R94.31  History:        Patient has prior history of Echocardiogram examinations, most                 recent 06/21/2017. CAD, Stroke and COPD; Risk                 Factors:Hypertension, Diabetes, Dyslipidemia and Former Smoker.                 Right hip fracture. History of lung cancer.  Sonographer:    Darlina Sicilian RDCS Referring Phys: [id number] DANIEL A MARCHWIANY  Sonographer Comments: Image acquisition challenging due to respiratory motion and Image acquisition challenging due to COPD. Stat Pre-Op with Dr. Johney Frame. Limited patient mobility.  Conclusion(s)/Recommendation(s): Subcostal view. Limited windows. Grossly LV and RV function appear normal. EF 60-65%. Phineas Inches Electronically signed by Phineas Inches Signature Date/Time: 05/13/2021/3:58:50 PM    Final     Microbiology Recent Results (from the past 240 hour(s))  Resp Panel by RT-PCR (Flu A&B, Covid) Nasopharyngeal Swab     Status: None   Collection Time: 05/18/21  3:31 PM   Specimen: Nasopharyngeal Swab; Nasopharyngeal(NP) swabs in vial transport medium  Result Value Ref Range Status   SARS Coronavirus 2 by RT PCR NEGATIVE NEGATIVE Final    Comment: (NOTE) SARS-CoV-2 target nucleic acids are NOT DETECTED.  The  SARS-CoV-2 RNA is generally detectable in upper respiratory specimens during the acute phase of infection. The lowest concentration of SARS-CoV-2 viral copies this assay can detect is 138 copies/mL. A negative result does not preclude SARS-Cov-2 infection and should not be used as the sole basis for treatment or other patient management decisions. A negative result may occur with  improper specimen collection/handling, submission of specimen other than nasopharyngeal swab, presence of viral mutation(s) within the areas targeted by this assay, and inadequate number of viral copies(<138 copies/mL). A negative result must be combined with clinical observations, patient history, and epidemiological information. The expected result is Negative.  Fact Sheet for Patients:  EntrepreneurPulse.com.au  Fact Sheet  for Healthcare Providers:  IncredibleEmployment.be  This test is no t yet approved or cleared by the [place] and  has been authorized for detection and/or diagnosis of SARS-CoV-2 by FDA under an Emergency Use Authorization (EUA). This EUA will remain  in effect (meaning this test can be used) for the duration of the COVID-19 declaration under Section 564(b)(1) of the Act, 21 U.S.C.section 360bbb-3(b)(1), unless the authorization is terminated  or revoked sooner.       Influenza A by PCR NEGATIVE NEGATIVE Final   Influenza B by PCR NEGATIVE NEGATIVE Final    Comment: (NOTE) The Xpert Xpress SARS-CoV-2/FLU/RSV plus assay is intended as an aid in the diagnosis of influenza from Nasopharyngeal swab specimens and should not be used as a sole basis for treatment. Nasal washings and aspirates are unacceptable for Xpert Xpress SARS-CoV-2/FLU/RSV testing.  Fact Sheet for Patients: EntrepreneurPulse.com.au  Fact Sheet for Healthcare Providers: IncredibleEmployment.be  This test is not yet approved or  cleared by the [place] FDA and has been authorized for detection and/or diagnosis of SARS-CoV-2 by FDA under an Emergency Use Authorization (EUA). This EUA will remain in effect (meaning this test can be used) for the duration of the COVID-19 declaration under Section 564(b)(1) of the Act, 21 U.S.C. section 360bbb-3(b)(1), unless the authorization is terminated or revoked.  Performed at [facility], [place] [address] [place], [place] [address]   Resp Panel by RT-PCR (Flu A&B, Covid) Nasopharyngeal Swab     Status: None   Collection Time: 05/20/2021  1:51 PM   Specimen: Nasopharyngeal Swab; Nasopharyngeal(NP) swabs in vial transport medium  Result Value Ref Range Status   SARS Coronavirus 2 by RT PCR NEGATIVE NEGATIVE Final    Comment: (NOTE) SARS-CoV-2 target nucleic acids are NOT DETECTED.  The SARS-CoV-2 RNA is generally detectable in upper respiratory specimens during the acute phase of infection. The lowest concentration of SARS-CoV-2 viral copies this assay can detect is 138 copies/mL. A negative result does not preclude SARS-Cov-2 infection and should not be used as the sole basis for treatment or other patient management decisions. A negative result may occur with  improper specimen collection/handling, submission of specimen other than nasopharyngeal swab, presence of viral mutation(s) within the areas targeted by this assay, and inadequate number of viral copies(<138 copies/mL). A negative result must be combined with clinical observations, patient history, and epidemiological information. The expected result is Negative.  Fact Sheet for Patients:  EntrepreneurPulse.com.au  Fact Sheet for Healthcare Providers:  IncredibleEmployment.be  This test is no t yet approved or cleared by the [place] FDA and  has been authorized for detection and/or diagnosis of SARS-CoV-2 by FDA under an Emergency Use  Authorization (EUA). This EUA will remain  in effect (meaning this test can be used) for the duration of the COVID-19 declaration under Section 564(b)(1) of the Act, 21 U.S.C.section 360bbb-3(b)(1), unless the authorization is terminated  or revoked sooner.       Influenza A by PCR NEGATIVE NEGATIVE Final   Influenza B by PCR NEGATIVE NEGATIVE Final    Comment: (NOTE) The Xpert Xpress SARS-CoV-2/FLU/RSV plus assay is intended as an aid in the diagnosis of influenza from Nasopharyngeal swab specimens and should not be used as a sole basis for treatment. Nasal washings and aspirates are unacceptable for Xpert Xpress SARS-CoV-2/FLU/RSV testing.  Fact Sheet for Patients: EntrepreneurPulse.com.au  Fact Sheet for Healthcare Providers: IncredibleEmployment.be  This test is not yet approved or cleared by the [place] FDA  and has been authorized for detection and/or diagnosis of SARS-CoV-2 by FDA under an Emergency Use Authorization (EUA). This EUA will remain in effect (meaning this test can be used) for the duration of the COVID-19 declaration under Section 564(b)(1) of the Act, 21 U.S.C. section 360bbb-3(b)(1), unless the authorization is terminated or revoked.  Performed at [facility], [place] [address], [place], [place] [address]   Blood Culture (routine x 2)     Status: None (Preliminary result)   Collection Time: 05/13/2021  5:10 PM   Specimen: BLOOD  Result Value Ref Range Status   Specimen Description   Final    BLOOD BLOOD LEFT WRIST Performed at [place] [address], [place], [place] [address]    Special Requests   Final    BOTTLES DRAWN AEROBIC AND ANAEROBIC Blood Culture adequate volume Performed at [facility] [address], [place], [place] [address]    Culture  Setup Time   Final    GRAM NEGATIVE RODS IN BOTH AEROBIC AND ANAEROBIC BOTTLES PATIENT DISCHARGED  OR EXPIRED Performed at [facility], [place] [address], [place], [place] [address]    Culture GRAM NEGATIVE RODS  Final   Report Status PENDING  Incomplete  Blood Culture (routine x 2)     Status: None (Preliminary result)   Collection Time: 05/06/2021  5:10 PM   Specimen: BLOOD  Result Value Ref Range Status   Specimen Description   Final    BLOOD BLOOD LEFT FOREARM Performed at [facility] [address] [place], [place] [address]    Special Requests   Final    BOTTLES DRAWN AEROBIC AND ANAEROBIC Blood Culture adequate volume Performed at [place] [address], [place], [place] [address]    Culture  Setup Time   Final    GRAM POSITIVE COCCI IN PAIRS AND CHAINS ANAEROBIC BOTTLE ONLY PATIENT DISCHARGED OR EXPIRED Performed at [facility], [place] [address], [place], [place] [address]    Culture GRAM POSITIVE COCCI  Final   Report Status PENDING  Incomplete  Blood Culture ID Panel (Reflexed)     Status: Abnormal   Collection Time: 05/14/2021  5:10 PM  Result Value Ref Range Status   Enterococcus faecalis NOT DETECTED NOT DETECTED Final   Enterococcus Faecium NOT DETECTED NOT DETECTED Final   Listeria monocytogenes NOT DETECTED NOT DETECTED Final   Staphylococcus species NOT DETECTED NOT DETECTED Final   Staphylococcus aureus (BCID) NOT DETECTED NOT DETECTED Final   Staphylococcus epidermidis NOT DETECTED NOT DETECTED Final   Staphylococcus lugdunensis NOT DETECTED NOT DETECTED Final   Streptococcus species NOT DETECTED NOT DETECTED Final   Streptococcus agalactiae NOT DETECTED NOT DETECTED Final   Streptococcus pneumoniae NOT DETECTED NOT DETECTED Final   Streptococcus pyogenes NOT DETECTED NOT DETECTED Final   A.calcoaceticus-baumannii NOT DETECTED NOT DETECTED Final   Bacteroides fragilis DETECTED (A) NOT DETECTED Final    Comment: PATIENT DISCHARGED OR EXPIRED 05/23/21 @0656  [place]    Enterobacterales NOT DETECTED NOT  DETECTED Final   Enterobacter cloacae complex NOT DETECTED NOT DETECTED Final   Escherichia coli NOT DETECTED NOT DETECTED Final   Klebsiella aerogenes NOT DETECTED NOT DETECTED Final   Klebsiella oxytoca NOT DETECTED NOT DETECTED Final   Klebsiella pneumoniae NOT DETECTED NOT DETECTED Final   Proteus species NOT DETECTED NOT DETECTED Final   Salmonella species NOT DETECTED NOT DETECTED Final   Serratia marcescens NOT DETECTED NOT  DETECTED Final   Haemophilus influenzae NOT DETECTED NOT DETECTED Final   Neisseria meningitidis NOT DETECTED NOT DETECTED Final   Pseudomonas aeruginosa NOT DETECTED NOT DETECTED Final   Stenotrophomonas maltophilia NOT DETECTED NOT DETECTED Final   Candida albicans NOT DETECTED NOT DETECTED Final   Candida auris NOT DETECTED NOT DETECTED Final   Candida glabrata NOT DETECTED NOT DETECTED Final   Candida krusei NOT DETECTED NOT DETECTED Final   Candida parapsilosis NOT DETECTED NOT DETECTED Final   Candida tropicalis NOT DETECTED NOT DETECTED Final   Cryptococcus neoformans/gattii NOT DETECTED NOT DETECTED Final    Comment: Performed at [facility], [place] [address] [place], [place] [address]  Blood Culture ID Panel (Reflexed)     Status: None   Collection Time: 05/23/2021  5:10 PM  Result Value Ref Range Status   Enterococcus faecalis NOT DETECTED NOT DETECTED Final   Enterococcus Faecium NOT DETECTED NOT DETECTED Final   Listeria monocytogenes NOT DETECTED NOT DETECTED Final   Staphylococcus species NOT DETECTED NOT DETECTED Final   Staphylococcus aureus (BCID) NOT DETECTED NOT DETECTED Final   Staphylococcus epidermidis NOT DETECTED NOT DETECTED Final   Staphylococcus lugdunensis NOT DETECTED NOT DETECTED Final   Streptococcus species NOT DETECTED NOT DETECTED Final   Streptococcus agalactiae NOT DETECTED NOT DETECTED Final   Streptococcus pneumoniae NOT DETECTED NOT DETECTED Final   Streptococcus pyogenes NOT DETECTED NOT DETECTED Final    A.calcoaceticus-baumannii NOT DETECTED NOT DETECTED Final   Bacteroides fragilis NOT DETECTED NOT DETECTED Final   Enterobacterales NOT DETECTED NOT DETECTED Final   Enterobacter cloacae complex NOT DETECTED NOT DETECTED Final   Escherichia coli NOT DETECTED NOT DETECTED Final   Klebsiella aerogenes NOT DETECTED NOT DETECTED Final   Klebsiella oxytoca NOT DETECTED NOT DETECTED Final   Klebsiella pneumoniae NOT DETECTED NOT DETECTED Final   Proteus species NOT DETECTED NOT DETECTED Final   Salmonella species NOT DETECTED NOT DETECTED Final   Serratia marcescens NOT DETECTED NOT DETECTED Final   Haemophilus influenzae NOT DETECTED NOT DETECTED Final   Neisseria meningitidis NOT DETECTED NOT DETECTED Final   Pseudomonas aeruginosa NOT DETECTED NOT DETECTED Final   Stenotrophomonas maltophilia NOT DETECTED NOT DETECTED Final   Candida albicans NOT DETECTED NOT DETECTED Final   Candida auris NOT DETECTED NOT DETECTED Final   Candida glabrata NOT DETECTED NOT DETECTED Final   Candida krusei NOT DETECTED NOT DETECTED Final   Candida parapsilosis NOT DETECTED NOT DETECTED Final   Candida tropicalis NOT DETECTED NOT DETECTED Final   Cryptococcus neoformans/gattii NOT DETECTED NOT DETECTED Final    Comment: Performed at [facility], [address], [place], [place] [address]  MRSA Next Gen by PCR, Nasal     Status: None   Collection Time: 05/25/2021  9:56 PM   Specimen: Nasal Mucosa; Nasal Swab  Result Value Ref Range Status   MRSA by PCR Next Gen NOT DETECTED NOT DETECTED Final    Comment: (NOTE) The GeneXpert MRSA Assay (FDA approved for NASAL specimens only), is one component of a comprehensive MRSA colonization surveillance program. It is not intended to diagnose MRSA infection nor to guide or monitor treatment for MRSA infections. Test performance is not FDA approved in patients less than 27 years old. Performed at [facility], [place] [address], [place],  [place] [address]     Lab Basic Metabolic Panel: Recent Labs  Lab 05/10/2021 1351 05/27/2021 1523  NA 140 140  K 4.6 4.5  CL  106 105  CO2 17*  --   GLUCOSE 127* 121*  BUN 31* 32*  CREATININE 1.32* 1.40*  CALCIUM 7.9*  --    Liver Function Tests: Recent Labs  Lab 05/09/2021 1351  AST 136*  ALT 61*  ALKPHOS 108  BILITOT 1.1  PROT 6.4*  ALBUMIN 2.6*   Recent Labs  Lab 05/07/2021 1351  LIPASE 34   No results for input(s): AMMONIA in the last 168 hours. CBC: Recent Labs  Lab 05/19/21 0348 05/28/2021 1351 05/25/2021 1523  WBC 15.8* 31.0*  --   NEUTROABS 11.3* 27.3*  --   HGB 9.0* 10.7* 11.9*  HCT 27.8* 35.4* 35.0*  MCV 90.8 97.3  --   PLT 278 394  --    Cardiac Enzymes: No results for input(s): CKTOTAL, CKMB, CKMBINDEX, TROPONINI in the last 168 hours. Sepsis Labs: Recent Labs  Lab 05/19/21 0348 05/06/2021 1351 05/28/2021 1551  WBC 15.8* 31.0*  --   LATICACIDVEN  --  >9.0* >9.0*    Procedures/Operations     Octavio Graves Shell Blanchette 05/25/2021, 8:53 AM

## 2021-06-03 NOTE — Progress Notes (Signed)
This nurse and Bethanne Ginger., RN wasted the remaining IV morphine from this patient's room with a total of 90 mL from the bag/line being wasted; unable to waste in the pyxis as this medication came from the pharmacy.

## 2021-06-03 DEATH — deceased

## 2021-06-04 ENCOUNTER — Other Ambulatory Visit: Payer: Medicare Other

## 2021-06-04 ENCOUNTER — Ambulatory Visit: Payer: Medicare Other | Admitting: Internal Medicine

## 2023-01-21 NOTE — Telephone Encounter (Signed)
Entered in error
